# Patient Record
Sex: Male | Born: 1935
Health system: Southern US, Community
[De-identification: ages and names within clinical notes are randomized; demographics above are authoritative.]

## PROBLEM LIST (undated history)

## (undated) DIAGNOSIS — J45909 Unspecified asthma, uncomplicated: Secondary | ICD-10-CM

## (undated) DIAGNOSIS — I639 Cerebral infarction, unspecified: Secondary | ICD-10-CM

## (undated) DIAGNOSIS — I10 Essential (primary) hypertension: Secondary | ICD-10-CM

## (undated) DIAGNOSIS — F419 Anxiety disorder, unspecified: Secondary | ICD-10-CM

## (undated) DIAGNOSIS — Z8673 Personal history of transient ischemic attack (TIA), and cerebral infarction without residual deficits: Secondary | ICD-10-CM

## (undated) DIAGNOSIS — E785 Hyperlipidemia, unspecified: Secondary | ICD-10-CM

## (undated) DIAGNOSIS — J449 Chronic obstructive pulmonary disease, unspecified: Secondary | ICD-10-CM

## (undated) DIAGNOSIS — K8050 Calculus of bile duct without cholangitis or cholecystitis without obstruction: Secondary | ICD-10-CM

## (undated) DIAGNOSIS — F0390 Unspecified dementia without behavioral disturbance: Secondary | ICD-10-CM

## (undated) HISTORY — DX: Unspecified asthma, uncomplicated: J45.909

## (undated) HISTORY — DX: Unspecified dementia, unspecified severity, without behavioral disturbance, psychotic disturbance, mood disturbance, and anxiety: F03.90

## (undated) HISTORY — DX: Hyperlipidemia, unspecified: E78.5

## (undated) HISTORY — DX: Calculus of bile duct without cholangitis or cholecystitis without obstruction: K80.50

## (undated) HISTORY — DX: Anxiety disorder, unspecified: F41.9

## (undated) HISTORY — DX: Personal history of transient ischemic attack (TIA), and cerebral infarction without residual deficits: Z86.73

## (undated) HISTORY — DX: Cerebral infarction, unspecified: I63.9

## (undated) HISTORY — PX: ROTATOR CUFF REPAIR: SHX139

---

## 2004-10-05 ENCOUNTER — Ambulatory Visit: Payer: Self-pay | Admitting: Gastroenterology

## 2004-10-25 ENCOUNTER — Ambulatory Visit: Payer: Self-pay | Admitting: Gastroenterology

## 2004-10-25 ENCOUNTER — Ambulatory Visit (HOSPITAL_COMMUNITY): Admission: RE | Admit: 2004-10-25 | Discharge: 2004-10-25 | Payer: Self-pay | Admitting: Gastroenterology

## 2005-09-06 ENCOUNTER — Emergency Department (HOSPITAL_COMMUNITY): Admission: EM | Admit: 2005-09-06 | Discharge: 2005-09-06 | Payer: Self-pay | Admitting: Emergency Medicine

## 2005-09-12 ENCOUNTER — Emergency Department (HOSPITAL_COMMUNITY): Admission: EM | Admit: 2005-09-12 | Discharge: 2005-09-12 | Payer: Self-pay | Admitting: Emergency Medicine

## 2006-11-07 ENCOUNTER — Emergency Department (HOSPITAL_COMMUNITY): Admission: EM | Admit: 2006-11-07 | Discharge: 2006-11-07 | Payer: Self-pay | Admitting: Emergency Medicine

## 2009-09-29 ENCOUNTER — Ambulatory Visit (HOSPITAL_COMMUNITY): Admission: RE | Admit: 2009-09-29 | Discharge: 2009-09-29 | Payer: Self-pay | Admitting: Family Medicine

## 2010-11-30 ENCOUNTER — Inpatient Hospital Stay (HOSPITAL_COMMUNITY): Payer: Medicare Other

## 2010-11-30 ENCOUNTER — Inpatient Hospital Stay (HOSPITAL_COMMUNITY)
Admission: EM | Admit: 2010-11-30 | Discharge: 2010-12-01 | DRG: 069 | Disposition: A | Discharge status: Home / Self Care | Payer: Medicare Other | Attending: Internal Medicine | Admitting: Internal Medicine

## 2010-11-30 ENCOUNTER — Emergency Department (HOSPITAL_COMMUNITY): Payer: Medicare Other

## 2010-11-30 DIAGNOSIS — G9389 Other specified disorders of brain: Secondary | ICD-10-CM | POA: Diagnosis present

## 2010-11-30 DIAGNOSIS — G459 Transient cerebral ischemic attack, unspecified: Principal | ICD-10-CM | POA: Diagnosis present

## 2010-11-30 DIAGNOSIS — I10 Essential (primary) hypertension: Secondary | ICD-10-CM | POA: Diagnosis present

## 2010-11-30 DIAGNOSIS — J4489 Other specified chronic obstructive pulmonary disease: Secondary | ICD-10-CM | POA: Diagnosis present

## 2010-11-30 DIAGNOSIS — E119 Type 2 diabetes mellitus without complications: Secondary | ICD-10-CM | POA: Diagnosis present

## 2010-11-30 DIAGNOSIS — J449 Chronic obstructive pulmonary disease, unspecified: Secondary | ICD-10-CM | POA: Diagnosis present

## 2010-11-30 DIAGNOSIS — E785 Hyperlipidemia, unspecified: Secondary | ICD-10-CM | POA: Diagnosis present

## 2010-11-30 LAB — RAPID URINE DRUG SCREEN, HOSP PERFORMED
Amphetamines: NOT DETECTED
Barbiturates: NOT DETECTED
Benzodiazepines: NOT DETECTED
Opiates: NOT DETECTED

## 2010-11-30 LAB — BASIC METABOLIC PANEL
Calcium: 9.1 mg/dL (ref 8.4–10.5)
GFR calc non Af Amer: >60 mL/min (ref >60)
Potassium: 3.6 mEq/L (ref 3.5–5.1)

## 2010-11-30 LAB — URINALYSIS, ROUTINE W REFLEX MICROSCOPIC
Bilirubin Urine: NEGATIVE
Ketones, ur: NEGATIVE mg/dL
Nitrite: NEGATIVE
Protein, ur: NEGATIVE mg/dL
Specific Gravity, Urine: 1.023 (ref 1.005–1.030)
Urobilinogen, UA: 0.2 mg/dL (ref 0.0–1.0)
pH: 5 (ref 5.0–8.0)

## 2010-11-30 LAB — APTT: aPTT: 32 seconds (ref 24–37)

## 2010-11-30 LAB — HEPATIC FUNCTION PANEL
ALT: 19 U/L (ref 0–53)
Bilirubin, Direct: <0.1 mg/dL (ref 0.0–0.3)

## 2010-11-30 LAB — CBC
MCH: 29.7 pg (ref 26.0–34.0)
MCHC: 34.7 g/dL (ref 30.0–36.0)
RBC: 4.44 MIL/uL (ref 4.22–5.81)

## 2010-11-30 LAB — CK TOTAL AND CKMB (NOT AT ARMC): Total CK: 143 U/L (ref 7–232)

## 2010-11-30 LAB — ETHANOL: Alcohol, Ethyl (B): <11 mg/dL — ABNORMAL HIGH (ref 0–10)

## 2010-12-01 DIAGNOSIS — G459 Transient cerebral ischemic attack, unspecified: Secondary | ICD-10-CM

## 2010-12-01 DIAGNOSIS — I517 Cardiomegaly: Secondary | ICD-10-CM

## 2010-12-01 LAB — LIPID PANEL
Cholesterol: 154 mg/dL (ref 0–200)
HDL: 26 mg/dL — ABNORMAL LOW (ref >39)
LDL Cholesterol: 59 mg/dL (ref 0–99)
Total CHOL/HDL Ratio: 5.9 RATIO
VLDL: 69 mg/dL — ABNORMAL HIGH (ref 0–40)

## 2010-12-01 LAB — GLUCOSE, CAPILLARY
Glucose-Capillary: 217 mg/dL — ABNORMAL HIGH (ref 70–99)
Glucose-Capillary: 166 mg/dL — ABNORMAL HIGH (ref 70–99)

## 2010-12-01 LAB — HEMOGLOBIN A1C: Mean Plasma Glucose: 180 mg/dL — ABNORMAL HIGH (ref <117)

## 2010-12-07 NOTE — H&P (Signed)
NAMEREMBERT, BROWE                 ACCOUNT NO.:  [id number]  MEDICAL RECORD NO.:  [id number]           PATIENT TYPE:  E  LOCATION:  WLED                         FACILITY:  [facility]  PHYSICIAN:  Hartley Barefoot, MD    DATE OF BIRTH:  31-May-1936  DATE OF ADMISSION:  11/30/2010 DATE OF DISCHARGE:                             HISTORY & PHYSICAL   CHIEF COMPLAINT:  Unable to focus and slurred speech.  HISTORY OF PRESENT ILLNESS:  This is a 75 year old very pleasant African American who presents to the emergency department after he had an episode of slurred speech.  He was referred by his primary care physician for workup of TIA.  The patient relates that around 10 a.m., while he was at work, he could not focus; he was not able to write or read.  He said that he was able to see but he was having problem understanding what he was seeing and what he was doing.  The episode lasted for 15 minutes to half an hour.  He called his wife and she thought that his speech was slurred.  He denies chest pain, shortness of breath or weakness.  This is first time that he had an episode like this.  ALLERGIES:  No known drug allergies.  PAST MEDICAL HISTORY: 1. Diabetes. 2. Hypertension. 3. COPD. 4. Hyperlipidemia.  PAST SURGICAL HISTORY:  History of rotator cuff tear, status post repair.  MEDICATION HISTORY: 1. Benicar 40 mg half a tablet b.i.d. 2. Glyburide/metformin 2.5/500 mg half a tablet b.i.d.  SOCIAL HISTORY:  Denies smoking, alcohol or recreational drugs.  He is married.  He has five daughters and one son.  He works as a Office manager. He lives with his wife.  FAMILY HISTORY:  Father died at 42, had history of kidney disease. Mother died at 78 year old, unknown cause.  REVIEW OF SYSTEMS:  Denies nausea, vomiting, weakness, abdominal pain, chest pain, shortness of breath, blurry vision, joint pain.  PHYSICAL EXAMINATION:  VITAL SIGNS:  Blood pressure initially 144/83 now 127/72,  temperature 98.8, pulse 86, respirations 25, saturation 97 on room air. GENERAL:  The patient is lying in bed in no acute distress. HEENT:  Head atraumatic, normocephalic.  Eyes anicteric.  Pupils equal, reactive to light.  Extraocular muscles intact. CARDIOVASCULAR:  S1, S2 regular rhythm and rate.  No rubs, murmurs or gallops. LUNGS:  Bilateral good air movement.  No wheezing, crackles or rhonchi. ABDOMEN:  Obese.  Bowel sounds present.  Abdomen is soft, nontender, nondistended.  No rigidity, no guarding. EXTREMITIES:  Pulses present.  No edema. NEUROLOGIC:  Alert and oriented x3.  Cranial nerves II-XII intact. Sensation grossly intact.  Motor strength 5/5 throughout.  Reflexes normal.  ADMISSION LABS:  UDS negative.  Sodium 140, potassium 3.6, chloride 104, bicarb 23, glucose 139, BUN 14, creatinine 1.09, calcium 9.1.  Alcohol less than 11.  Troponin less than 0.30, CK-MB 1.6.  PTT 32, PT 13, INR 1.0.  UA negative.  CBC, white blood cell 6.6, hemoglobin 13.2, hematocrit 38.0, platelet 183.  RADIOGRAPHIC STUDIES: 1. CT head:  No acute intracranial finding, mild microvascular  disease. 2. Chest x-ray:  Seen on examination no acute cardiopulmonary process.  ASSESSMENT AND PLAN:  This is a very pleasant 75 year old that presents with neurological symptoms that resolved within 30 minutes. 1. Transient ischemic attack:  The patient presents with neurological     symptoms that resolved within 30 minutes.  He has multiple risk     factors for a stroke; diabetes, hypertension, possible     hyperlipidemia.  We will do a workup for TIA; MRI, carotid     Dopplers, 2-D echo, fasting lipid panel.  We will do a swallow     evaluation.  PT, OT consult.  Aspirin; the patient was not taking     aspirin, will start aspirin p.o. daily.  We will try to keep blood     pressure below 160. 2. Hypertension:  We are going to start on olmesartan in the morning     if his blood pressure is more than  120.  Will write for hydralazine     p.r.n. for systolic blood pressure more than 150. 3. Diabetes:  Will check hemoglobin A1c.  We will start sliding scale     insulin, CBG t.i.d. and at bedtime. 4. Chronic obstructive pulmonary disease:  Appears to be stable, we     will start albuterol p.r.n. 5. For deep venous thrombosis prophylaxis, Lovenox.     Hartley Barefoot, MD     BR/MEDQ  D:  11/30/2010  T:  11/30/2010  Job:  [id number]  Electronically Signed by Hartley Barefoot MD on 12/07/2010 09:58:57 PM

## 2010-12-07 NOTE — Discharge Summary (Signed)
[name], [name]                 ACCOUNT NO.:  [id number]  MEDICAL RECORD NO.:  [id number]           PATIENT TYPE:  I  LOCATION:  1411                         FACILITY:  [facility]  PHYSICIAN:  Hartley Barefoot, MD    DATE OF BIRTH:  03-01-1936  DATE OF ADMISSION:  11/30/2010 DATE OF DISCHARGE:  12/01/2010                              DISCHARGE SUMMARY   DISCHARGE DIAGNOSES: 1. Transient ischemic attack. 2. Cystic encephalomalacia. 3. Hypertension. 4. Diabetes.  DISCHARGE MEDICATIONS: 1. Norvasc 2.5 mg p.o. daily. 2. Aspirin 81 mg p.o. daily. 3. Glyburide and metformin 2.5/500 mg 1 tablet by mouth twice daily. 4. Benicar 40 mg half tablet p.o. b.i.d.  DISPOSITION AND FOLLOWUP:  [name] will need to follow with his primary care physician within 2 weeks to follow blood sugar level.  He might need at that time increased dose of his medications or consider insulin. The patient might benefit of fish oil also.  This can be addressed by his primary care physician.[facility] will call him also to arrange an appointment for followup of cystic encephalomalacia.  BRIEF HISTORY OF PRESENT ILLNESS:  This is a very pleasant 75 year old African American with past medical history of hypertension, diabetes, COPD who presents to the emergency department after slurred speech and confusion that lasted for 30 minutes.  The patient was admitted for a TIA workup. 1. TIA symptoms, resolved.  The patient was admitted to telemetry.  No     arrhythmia on telemetry.  Preliminary report of vascular carotid     Doppler showed no evidence of ICA stenosis.  Vertebral artery flow     is anterograde bilaterally.  A 2-D echo showed ejection fraction of     60-65%, no wall motion abnormality.  Doppler parameters consistent     with diastolic heart failure.  MRI showed no acute stroke but     showed prior stroke in the frontal lobe and parietal lobe and     vascular ischemic changes.  The patient was  continued on aspirin.     He was not taking aspirin.  His diabetes medication is going to be     increased to try to control better his blood sugar.  Hemoglobin A1c     was 7.9.  His fasting lipid panel, his LDL was good at 59.  His HDL     was low.  He might benefit from fish oil or change in diet. 2. Probably cystic encephalomalacia per MRI.  I will refer to      neurologist.  He might need a followup MRI as per recommendations     of radiologist. 3. Diabetes.  His hemoglobin A1c is 7.9.  We will increase his     medication.  He will need to follow up with his primary care     physician within 1 or 2 weeks, follow blood sugar level and     consider increased medications or add insulin. 4. Hypertension.  The patient was continued on his home Benicar.  We     are going to add Norvasc 2.5 low dose.  He  will need to follow with     his primary care physician.  STUDIES PERFORMED:  MRI showed no acute infarct.  Remote infarct and prominent small vessel cystic changes.  Small cystic appearance anterior- superior left frontal lobe most likely related to cystic encephalomalacia from prior infarct.  MRA of head showed intracranial atherosclerotic-type changes.  2-D echo, normal ejection fraction of 60- 65% and no wall motion abnormality.  Diastolic heart dysfunction type 1.     Hartley Barefoot, MD     BR/MEDQ  D:  12/01/2010  T:  12/02/2010  Job:  [id number]  cc:   [facility]  [facility]  Electronically Signed by Hartley Barefoot MD on 12/07/2010 10:01:38 PM

## 2011-07-10 DIAGNOSIS — I639 Cerebral infarction, unspecified: Secondary | ICD-10-CM

## 2011-07-10 HISTORY — DX: Cerebral infarction, unspecified: I63.9

## 2011-12-30 ENCOUNTER — Encounter (HOSPITAL_COMMUNITY): Payer: Self-pay | Admitting: *Deleted

## 2011-12-30 ENCOUNTER — Emergency Department (HOSPITAL_COMMUNITY): Payer: Medicare Other

## 2011-12-30 ENCOUNTER — Emergency Department (HOSPITAL_COMMUNITY)
Admission: EM | Admit: 2011-12-30 | Discharge: 2011-12-30 | Disposition: A | Discharge status: Home / Self Care | Payer: Medicare Other | Attending: Emergency Medicine | Admitting: Emergency Medicine

## 2011-12-30 DIAGNOSIS — E119 Type 2 diabetes mellitus without complications: Secondary | ICD-10-CM | POA: Insufficient documentation

## 2011-12-30 DIAGNOSIS — I10 Essential (primary) hypertension: Secondary | ICD-10-CM | POA: Insufficient documentation

## 2011-12-30 DIAGNOSIS — J40 Bronchitis, not specified as acute or chronic: Secondary | ICD-10-CM

## 2011-12-30 DIAGNOSIS — J4489 Other specified chronic obstructive pulmonary disease: Secondary | ICD-10-CM | POA: Insufficient documentation

## 2011-12-30 DIAGNOSIS — J449 Chronic obstructive pulmonary disease, unspecified: Secondary | ICD-10-CM

## 2011-12-30 HISTORY — DX: Chronic obstructive pulmonary disease, unspecified: J44.9

## 2011-12-30 HISTORY — DX: Essential (primary) hypertension: I10

## 2011-12-30 MED ORDER — AZITHROMYCIN 250 MG PO TABS: 250.0000 mg | ORAL_TABLET | Freq: Every day | ORAL | Status: AC

## 2011-12-30 MED ORDER — PREDNISONE 20 MG PO TABS
60.0000 mg | ORAL_TABLET | Freq: Once | ORAL | Status: AC
  Administered 2011-12-30: 60 mg via ORAL
  Filled 2011-12-30: qty 3

## 2011-12-30 MED ORDER — PREDNISONE 10 MG PO TABS: 40.0000 mg | ORAL_TABLET | Freq: Every day | ORAL | Status: DC

## 2011-12-30 MED ORDER — DM-GUAIFENESIN ER 30-600 MG PO TB12: 1.0000 | ORAL_TABLET | Freq: Two times a day (BID) | ORAL | Status: AC

## 2011-12-30 NOTE — ED Provider Notes (Addendum)
History     CSN: [id number]  Arrival date & time 12/30/11  0736   First MD Initiated Contact with Patient 12/30/11 0740      Chief Complaint  Patient presents with  . Shortness of Breath    (Consider location/radiation/quality/duration/timing/severity/associated sxs/prior treatment) Patient is a 76 y.o. male presenting with shortness of breath. The history is provided by the patient and a relative.  Shortness of Breath  The current episode started 3 to 5 days ago. The problem occurs frequently. The problem has been gradually improving. The problem is moderate. The symptoms are relieved by rest. The symptoms are aggravated by activity. Associated symptoms include cough and shortness of breath. Pertinent negatives include no chest pain, no fever, no sore throat and no wheezing. The cough is productive. Nothing worsens the cough.   Patient with a history of COPD has had some nasal congestion worsening of cough and productive cough is yellow and shortness of breath mostly with exertion since Thursday 4 days ago. Patient already using his albuterol inhaler approximately every 6 hours. Past Medical History  Diagnosis Date  . COPD (chronic obstructive pulmonary disease)   . Diabetes mellitus   . Hypertension     Past Surgical History  Procedure Date  . Rotator cuff repair     No family history on file.  History  Substance Use Topics  . Smoking status: Never Smoker   . Smokeless tobacco: Not on file  . Alcohol Use: No      Review of Systems  Constitutional: Negative for fever.  HENT: Positive for congestion. Negative for sore throat.   Respiratory: Positive for cough and shortness of breath. Negative for wheezing.   Cardiovascular: Negative for chest pain.  Gastrointestinal: Negative for nausea, vomiting and abdominal pain.  Genitourinary: Negative for dysuria.  Musculoskeletal: Negative for back pain.  Skin: Negative for rash.  Neurological: Negative for headaches.    Hematological: Does not bruise/bleed easily.    Allergies  Review of patient's allergies indicates no known allergies.  Home Medications   Current Outpatient Rx  Name Route Sig Dispense Refill  . AZITHROMYCIN 250 MG PO TABS Oral Take 1 tablet (250 mg total) by mouth daily. Take first 2 tablets together, then 1 every day until finished. 6 tablet 0  . DM-GUAIFENESIN ER 30-600 MG PO TB12 Oral Take 1 tablet by mouth every 12 (twelve) hours. 14 tablet 1  . PREDNISONE 10 MG PO TABS Oral Take 4 tablets (40 mg total) by mouth daily. 20 tablet 0    BP 156/93  Pulse 80  Temp 98.2 F (36.8 C) (Oral)  Resp 16  Ht 6' (1.829 m)  Wt 212 lb (96.163 kg)  BMI 28.75 kg/m2  SpO2 99%  Physical Exam  Nursing note and vitals reviewed. Constitutional: He is oriented to person, place, and time. He appears well-developed and well-nourished. No distress.  HENT:  Head: Normocephalic and atraumatic.  Mouth/Throat: Oropharynx is clear and moist.  Eyes: Conjunctivae and EOM are normal. Pupils are equal, round, and reactive to light.  Neck: Normal range of motion. Neck supple.  Cardiovascular: Normal rate, regular rhythm and normal heart sounds.   No murmur heard. Pulmonary/Chest: Effort normal and breath sounds normal. No respiratory distress. He has no wheezes. He has no rales.  Abdominal: Soft. Bowel sounds are normal. There is no tenderness.  Musculoskeletal: Normal range of motion.  Neurological: He is alert and oriented to person, place, and time.  Skin: No rash noted.  ED Course  Procedures (including critical care time)  Labs Reviewed - No data to display Dg Chest 2 View  12/30/2011  *RADIOLOGY REPORT*  Clinical Data: Short of breath  CHEST - 2 VIEW  Comparison: 11/30/2010  Findings: Upper normal heart size.  No consolidation, mass, pleural effusion, pneumothorax.  Stable bronchitic changes.  IMPRESSION: No active cardiopulmonary disease.  Original Report Authenticated By: Donavan Burnet,  M.D.     1. COPD (chronic obstructive pulmonary disease)   2. Bronchitis       MDM    Patient's oxygen saturation or room air is 97% no acute distress nontoxic history of COPD probably exacerbated by an upper rest for infection with bronchitis. Will start a course of prednisone and Mucinex DM and a trial of Zithromax in case of secondary bacterial infection. Patient's primary care Dr. To followup with.      Shelda Jakes, MD 12/30/11 0900  Shelda Jakes, MD 12/30/11 [phone]

## 2011-12-30 NOTE — ED Notes (Signed)
Pt states productive cough, yellow in color. Cough is worse at night. Hx of COPD per pt. Shortness of breath at times, worse with exertion. "Soreness to chest from coughing so much, but it is about gone now." per pt.

## 2011-12-30 NOTE — Discharge Instructions (Signed)
Bronchitis Bronchitis is a problem of the air tubes leading to your lungs. This problem makes it hard for air to get in and out of the lungs. You may cough a lot because your air tubes are narrow. Going without care can cause lasting (chronic) bronchitis. HOME CARE   Drink enough fluids to keep your pee (urine) clear or pale yellow.   Use a cool mist humidifier.   Quit smoking if you smoke. If you keep smoking, the bronchitis might not get better.   Only take medicine as told by your doctor.  GET HELP RIGHT AWAY IF:   Coughing keeps you awake.   You start to wheeze.   You become more sick or weak.   You have a hard time breathing or get short of breath.   You cough up blood.   Coughing lasts more than 2 weeks.   You have a fever.   Your baby is older than 3 months with a rectal temperature of 102 F (38.9 C) or higher.   Your baby is 24 months old or younger with a rectal temperature of 100.4 F (38 C) or higher.  MAKE SURE YOU:  Understand these instructions.   Will watch your condition.   Will get help right away if you are not doing well or get worse.  Document Released: 12/12/2007 Document Revised: 06/14/2011 Document Reviewed: 05/27/2009 [facility] Patient Information 2012 [place], [place].  Use albuterol inhaler every 6 hours for the next week at least. Take antibiotic as directed take prednisone as directed. Return for new worse symptoms. Either use Mucinex DM prescription or get it over-the-counter. Followup with regular doctor if not improving in 2 days.

## 2012-07-09 ENCOUNTER — Emergency Department (HOSPITAL_COMMUNITY): Payer: Medicare Other

## 2012-07-09 ENCOUNTER — Emergency Department (HOSPITAL_COMMUNITY)
Admission: EM | Admit: 2012-07-09 | Discharge: 2012-07-09 | Discharge status: Against Medical Advice | Payer: Medicare Other | Attending: Emergency Medicine | Admitting: Emergency Medicine

## 2012-07-09 ENCOUNTER — Encounter (HOSPITAL_COMMUNITY): Payer: Self-pay

## 2012-07-09 DIAGNOSIS — I10 Essential (primary) hypertension: Secondary | ICD-10-CM | POA: Insufficient documentation

## 2012-07-09 DIAGNOSIS — J4489 Other specified chronic obstructive pulmonary disease: Secondary | ICD-10-CM | POA: Insufficient documentation

## 2012-07-09 DIAGNOSIS — J449 Chronic obstructive pulmonary disease, unspecified: Secondary | ICD-10-CM | POA: Insufficient documentation

## 2012-07-09 DIAGNOSIS — R05 Cough: Secondary | ICD-10-CM

## 2012-07-09 DIAGNOSIS — Z79899 Other long term (current) drug therapy: Secondary | ICD-10-CM | POA: Insufficient documentation

## 2012-07-09 DIAGNOSIS — R0602 Shortness of breath: Secondary | ICD-10-CM | POA: Insufficient documentation

## 2012-07-09 DIAGNOSIS — J3489 Other specified disorders of nose and nasal sinuses: Secondary | ICD-10-CM | POA: Insufficient documentation

## 2012-07-09 DIAGNOSIS — R61 Generalized hyperhidrosis: Secondary | ICD-10-CM | POA: Insufficient documentation

## 2012-07-09 DIAGNOSIS — R059 Cough, unspecified: Secondary | ICD-10-CM | POA: Insufficient documentation

## 2012-07-09 DIAGNOSIS — R079 Chest pain, unspecified: Secondary | ICD-10-CM | POA: Insufficient documentation

## 2012-07-09 DIAGNOSIS — E119 Type 2 diabetes mellitus without complications: Secondary | ICD-10-CM | POA: Insufficient documentation

## 2012-07-09 MED ORDER — ALBUTEROL SULFATE (5 MG/ML) 0.5% IN NEBU
2.5000 mg | INHALATION_SOLUTION | Freq: Once | RESPIRATORY_TRACT | Status: AC
  Administered 2012-07-09: 2.5 mg via RESPIRATORY_TRACT
  Filled 2012-07-09: qty 0.5

## 2012-07-09 MED ORDER — IPRATROPIUM BROMIDE 0.02 % IN SOLN
0.5000 mg | Freq: Once | RESPIRATORY_TRACT | Status: AC
  Administered 2012-07-09: 0.5 mg via RESPIRATORY_TRACT
  Filled 2012-07-09: qty 2.5

## 2012-07-09 NOTE — ED Notes (Signed)
Patient asking about discharge. Will inform Dr Deretha Emory.

## 2012-07-09 NOTE — ED Notes (Signed)
Pt reports saw PCP Monday for regular check up.  PCP stopped pt's glyburide and increased his metformin.  Pt c/o coughing spells, diaphoresis, pain in chest with coughing.

## 2012-07-09 NOTE — ED Notes (Signed)
Respiratory paged for breathing treatment 

## 2012-07-09 NOTE — ED Notes (Signed)
Secretary informed RN that patient left ED without waiting for discharge. Dr Deretha Emory informed.

## 2012-07-09 NOTE — ED Provider Notes (Signed)
History   This chart was scribed for Shelda Jakes, MD, by Frederik Pear, ER scribe. The patient was seen in room APA03/APA03 and the patient's care was started at 1202.   CSN: [id number]  Arrival date & time 07/09/12  1002   First MD Initiated Contact with Patient 07/09/12 1202      Chief Complaint  Patient presents with  . Cough    (Consider location/radiation/quality/duration/timing/severity/associated sxs/prior treatment) Patient is a 77 y.o. male presenting with cough. The history is provided by the patient. No language interpreter was used.  Cough This is a new problem. The current episode started yesterday. The problem occurs every few minutes. The problem has been gradually improving. There has been no fever. Associated symptoms include chest pain and shortness of breath. Pertinent negatives include no headaches and no eye redness. Treatments tried: Breathing treatment. He is not a smoker. His past medical history is significant for COPD.    [name] is a 77 y.o. male with a h/o of COPD, hypertension, and DM who presents to the Emergency Department complaining of constant, moderate SOB with associated diaphoresis that began yesterday. His wife reports an associated gradually improving deep cough that began yesterday. He states that he used his nebulizer at home yesterday, but not today.He states that he saw his PCP 2 days ago for a regular checkup and has elevated glucose levels.   PCP is Dr. Constance Haw in [facility].   Past Medical History  Diagnosis Date  . COPD (chronic obstructive pulmonary disease)   . Diabetes mellitus   . Hypertension     Past Surgical History  Procedure Date  . Rotator cuff repair     No family history on file.  History  Substance Use Topics  . Smoking status: Never Smoker   . Smokeless tobacco: Not on file  . Alcohol Use: No      Review of Systems  Constitutional: Positive for diaphoresis. Negative for fever.  HENT: Positive  for congestion.   Eyes: Negative for redness.  Respiratory: Positive for cough and shortness of breath.   Cardiovascular: Positive for chest pain.  Gastrointestinal: Negative for nausea, vomiting and abdominal pain.  Genitourinary: Negative for dysuria and hematuria.  Musculoskeletal: Negative for back pain.  Skin: Negative for rash.  Neurological: Negative for headaches.  All other systems reviewed and are negative.    Allergies  Review of patient's allergies indicates no known allergies.  Home Medications   Current Outpatient Rx  Name  Route  Sig  Dispense  Refill  . ALBUTEROL SULFATE (2.5 MG/3ML) 0.083% IN NEBU   Nebulization   Take 2.5 mg by nebulization every 6 (six) hours as needed. Shortness of breath.         . ASPIRIN EC 81 MG PO TBEC   Oral   Take 81 mg by mouth daily.         [name] Kitchen FLUTICASONE-SALMETEROL 250-50 MCG/DOSE IN AEPB   Inhalation   Inhale 1 puff into the lungs every 12 (twelve) hours.         [name] Kitchen METFORMIN HCL 1000 MG PO TABS   Oral   Take 1,000 mg by mouth 2 (two) times daily with a meal.         . OLMESARTAN MEDOXOMIL 40 MG PO TABS   Oral   Take 40 mg by mouth daily.           BP 138/76  Pulse 89  Temp 99.1 F (37.3 C) (Oral)  Resp 19  Ht 6' (1.829 m)  Wt 210 lb (95.255 kg)  BMI 28.48 kg/m2  SpO2 98%  Physical Exam  Nursing note and vitals reviewed. Constitutional: He is oriented to person, place, and time. He appears well-developed and well-nourished.  HENT:  Head: Normocephalic and atraumatic.  Cardiovascular: Normal rate and regular rhythm.   No murmur heard. Pulmonary/Chest: Effort normal. No respiratory distress. He has wheezes. He has no rales. He exhibits no tenderness.       He has wheezing bilaterally, but predominantly on the right side.  Abdominal: Soft. Bowel sounds are normal. He exhibits no distension and no mass. There is no tenderness. There is no rebound and no guarding.  Musculoskeletal: Normal range of  motion. He exhibits no edema.  Neurological: He is alert and oriented to person, place, and time. No cranial nerve deficit. He exhibits normal muscle tone. Coordination normal.  Psychiatric: He has a normal mood and affect. Thought content normal.    ED Course  Procedures (including critical care time)  DIAGNOSTIC STUDIES: Oxygen Saturation is 99% on room air, normal by my interpretation.    COORDINATION OF CARE:  12:54- Discussed planned course of treatment with the patient, including blood work, who is agreeable at this time.   Labs Reviewed  GLUCOSE, CAPILLARY - Abnormal; Notable for the following:    Glucose-Capillary 192 (*)     All other components within normal limits  LAB REPORT - SCANNED   No results found. Results for orders placed during the hospital encounter of 07/09/12  GLUCOSE, CAPILLARY      Component Value Range   Glucose-Capillary 192 (*) 70 - 99 mg/dL     1. Cough   2. Chest pain       MDM  Patient ended up leaving AMA before discharge. Chest Xray negative for pneumonia and blood sugar not significantly elevated.   I personally performed the services described in this documentation, which was scribed in my presence. The recorded information has been reviewed and is accurate.         Shelda Jakes, MD 07/12/12 [phone]

## 2012-07-09 NOTE — ED Notes (Signed)
Alert. Ambulatory to room with steady gait and without assistance. No distress noted. Awaiting x ray and MD evaluation.

## 2012-10-01 ENCOUNTER — Telehealth: Payer: Self-pay | Admitting: Family Medicine

## 2012-10-01 MED ORDER — AMLODIPINE BESYLATE 2.5 MG PO TABS: 2.5000 mg | ORAL_TABLET | Freq: Every day | ORAL | Status: DC

## 2012-10-01 NOTE — Telephone Encounter (Signed)
rx refilled.

## 2012-10-11 ENCOUNTER — Encounter: Payer: Self-pay | Admitting: Family Medicine

## 2012-10-11 DIAGNOSIS — Z8673 Personal history of transient ischemic attack (TIA), and cerebral infarction without residual deficits: Secondary | ICD-10-CM | POA: Insufficient documentation

## 2012-10-13 ENCOUNTER — Ambulatory Visit (INDEPENDENT_AMBULATORY_CARE_PROVIDER_SITE_OTHER): Payer: Medicare Other | Admitting: Family Medicine

## 2012-10-13 ENCOUNTER — Encounter: Payer: Self-pay | Admitting: Family Medicine

## 2012-10-13 VITALS — BP 100/62 | HR 88 | Temp 98.2°F | Resp 16 | Wt 220.0 lb

## 2012-10-13 DIAGNOSIS — E119 Type 2 diabetes mellitus without complications: Secondary | ICD-10-CM | POA: Insufficient documentation

## 2012-10-13 DIAGNOSIS — I10 Essential (primary) hypertension: Secondary | ICD-10-CM | POA: Insufficient documentation

## 2012-10-13 DIAGNOSIS — E1159 Type 2 diabetes mellitus with other circulatory complications: Secondary | ICD-10-CM | POA: Insufficient documentation

## 2012-10-13 DIAGNOSIS — E785 Hyperlipidemia, unspecified: Secondary | ICD-10-CM

## 2012-10-13 LAB — LIPID PANEL
Cholesterol: 179 mg/dL (ref 0–200)
LDL Cholesterol: 84 mg/dL (ref 0–99)
Triglycerides: 303 mg/dL — ABNORMAL HIGH (ref <150)

## 2012-10-13 LAB — HEPATIC FUNCTION PANEL
ALT: 19 U/L (ref 0–53)
Total Protein: 7.5 g/dL (ref 6.0–8.3)

## 2012-10-13 LAB — BASIC METABOLIC PANEL
BUN: 16 mg/dL (ref 6–23)
Calcium: 9.7 mg/dL (ref 8.4–10.5)
Creat: 1.45 mg/dL — ABNORMAL HIGH (ref 0.50–1.35)
Glucose, Bld: 227 mg/dL — ABNORMAL HIGH (ref 70–99)

## 2012-10-13 LAB — HEMOGLOBIN A1C: Hgb A1c MFr Bld: 8.1 % — ABNORMAL HIGH (ref <5.7)

## 2012-10-13 NOTE — Progress Notes (Signed)
Subjective:     Patient ID: [name], male   DOB: [birth year], 77 y.o.   MRN: [id number]  HPI Patient is here today for followup of his diabetes, hypertension, hyperlipidemia and history of TIA.  He is taking an aspirin 81 mg every day. His medication list is reviewed. He reports 100% compliance with medications. He is also trying to adhere to a low carbohydrate diet. He's not been any regular exercise. He reports random blood sugars of 120-140. Past Medical History  Diagnosis Date  . COPD (chronic obstructive pulmonary disease)   . Hx-TIA (transient ischemic attack)   . Diabetes mellitus   . Hypertension   . Hyperlipidemia    Current Outpatient Prescriptions on File Prior to Visit  Medication Sig Dispense Refill  . albuterol (PROVENTIL) (2.5 MG/3ML) 0.083% nebulizer solution Take 2.5 mg by nebulization every 6 (six) hours as needed. Shortness of breath.      [name] Kitchen amLODipine (NORVASC) 2.5 MG tablet Take 1 tablet (2.5 mg total) by mouth daily.  30 tablet  3  . aspirin EC 81 MG tablet Take 81 mg by mouth daily.      . Fluticasone-Salmeterol (ADVAIR) 250-50 MCG/DOSE AEPB Inhale 1 puff into the lungs every 12 (twelve) hours.      [name] Kitchen glipiZIDE (GLUCOTROL XL) 10 MG 24 hr tablet Take 10 mg by mouth daily.      . metFORMIN (GLUCOPHAGE) 1000 MG tablet Take 1,000 mg by mouth 2 (two) times daily with a meal.      . montelukast (SINGULAIR) 10 MG tablet Take 10 mg by mouth at bedtime.      [name] Kitchen olmesartan (BENICAR) 40 MG tablet Take 40 mg by mouth daily.      [name] Kitchen omeprazole (PRILOSEC) 40 MG capsule Take 40 mg by mouth daily.       No current facility-administered medications on file prior to visit.   History   Social History  . Marital Status: Married    Spouse Name: N/A    Number of Children: N/A  . Years of Education: N/A   Occupational History  . Not on file.   Social History Main Topics  . Smoking status: Never Smoker   . Smokeless tobacco: Not on file  . Alcohol Use: No  . Drug Use: No   . Sexually Active:    Other Topics Concern  . Not on file   Social History Narrative  . No narrative on file     Review of Systems  Constitutional: Negative.   HENT: Negative.   Respiratory: Negative.   Cardiovascular: Negative.   Gastrointestinal: Negative.   Genitourinary: Negative.   Neurological: Negative.        Objective:   Physical Exam  Constitutional: He appears well-developed and well-nourished.  HENT:  Head: Normocephalic.  Eyes: Conjunctivae and EOM are normal. Pupils are equal, round, and reactive to light.  Neck: Normal range of motion. Neck supple. No JVD present. No thyromegaly present.  Cardiovascular: Normal rate, normal heart sounds and intact distal pulses.   No murmur heard. Pulmonary/Chest: Effort normal and breath sounds normal. No respiratory distress. He has no wheezes. He has no rales.  Abdominal: Soft. Bowel sounds are normal. He exhibits no distension. There is no tenderness. There is no rebound.  Lymphadenopathy:    He has no cervical adenopathy.       Assessment:     Type II or unspecified type diabetes mellitus without mention of complication, uncontrolled - Plan: Basic Metabolic Panel, Hemoglobin  A1c, Hepatic Function Panel, Lipid Panel  Essential hypertension, benign  Other and unspecified hyperlipidemia      Plan:     1. Type II or unspecified type diabetes mellitus without mention of complication, uncontrolled A1c is less than 6.5 - Basic Metabolic Panel - Hemoglobin A1c - Hepatic Function Panel - Lipid Panel  2. Essential hypertension, benign Well controlled no change in therapy.  3. Other and unspecified hyperlipidemia Go LDL is less than 100.

## 2012-10-14 MED ORDER — SITAGLIPTIN PHOSPHATE 100 MG PO TABS: 100.0000 mg | ORAL_TABLET | Freq: Every day | ORAL | Status: DC

## 2012-10-14 NOTE — Addendum Note (Signed)
Addended by: Lynnea Ferrier T on: 10/14/2012 07:19 AM   Modules accepted: Orders

## 2012-10-30 ENCOUNTER — Other Ambulatory Visit: Payer: Self-pay | Admitting: Family Medicine

## 2012-11-10 ENCOUNTER — Telehealth: Payer: Self-pay | Admitting: Family Medicine

## 2012-11-10 MED ORDER — FLUTICASONE-SALMETEROL 250-50 MCG/DOSE IN AEPB: 1.0000 | INHALATION_SPRAY | Freq: Two times a day (BID) | RESPIRATORY_TRACT | Status: DC

## 2012-11-10 NOTE — Telephone Encounter (Signed)
Rx Refilled  

## 2012-11-13 ENCOUNTER — Telehealth: Payer: Self-pay | Admitting: Family Medicine

## 2012-11-13 NOTE — Telephone Encounter (Signed)
Rx faxed with refill.

## 2012-11-27 NOTE — Telephone Encounter (Signed)
Done 11/13/12

## 2012-12-11 ENCOUNTER — Encounter: Payer: Self-pay | Admitting: Family Medicine

## 2012-12-11 ENCOUNTER — Ambulatory Visit (INDEPENDENT_AMBULATORY_CARE_PROVIDER_SITE_OTHER): Payer: Medicare Other | Admitting: Family Medicine

## 2012-12-11 VITALS — BP 142/60 | HR 78 | Temp 98.0°F | Resp 18 | Ht 71.0 in | Wt 220.0 lb

## 2012-12-11 DIAGNOSIS — F411 Generalized anxiety disorder: Secondary | ICD-10-CM

## 2012-12-11 LAB — HEMOGLOBIN A1C: Mean Plasma Glucose: 143 mg/dL — ABNORMAL HIGH (ref <117)

## 2012-12-11 LAB — COMPLETE METABOLIC PANEL WITH GFR
ALT: 24 U/L (ref 0–53)
CO2: 23 mEq/L (ref 19–32)
Calcium: 9.3 mg/dL (ref 8.4–10.5)
Chloride: 105 mEq/L (ref 96–112)
Creat: 1.34 mg/dL (ref 0.50–1.35)
GFR, Est African American: 60 mL/min

## 2012-12-11 MED ORDER — ESCITALOPRAM OXALATE 10 MG PO TABS: 10.0000 mg | ORAL_TABLET | Freq: Every day | ORAL | Status: DC

## 2012-12-11 NOTE — Progress Notes (Signed)
Subjective:    Patient ID: [name], male    DOB: 1936-02-05, 77 y.o.   MRN: [id number]  HPI Patient is here today to recheck his sugars. However he also is complaining of increasing anxiety. He states he avoids riding in cars due to the fear. He rises this fear is unfounded however he cannot control it. He has panic attacks when he is riding in cars. This is leading him to become more isolated and remain home the majority of time. He also reports a generalized sense of anxiety which never leaves him. He denies any depression or anhedonia. He states that his sugars are always under 100. He is currently taking glipizide, metformin and Januvia for his diabetes. Past Medical History  Diagnosis Date  . COPD (chronic obstructive pulmonary disease)   . Hx-TIA (transient ischemic attack)   . Diabetes mellitus   . Hypertension   . Hyperlipidemia    Current Outpatient Prescriptions on File Prior to Visit  Medication Sig Dispense Refill  . albuterol (PROVENTIL) (2.5 MG/3ML) 0.083% nebulizer solution Take 2.5 mg by nebulization every 6 (six) hours as needed. Shortness of breath.      [name] Kitchen amLODipine (NORVASC) 2.5 MG tablet Take 1 tablet (2.5 mg total) by mouth daily.  30 tablet  3  . aspirin EC 81 MG tablet Take 81 mg by mouth daily.      . Blood Glucose Monitoring Suppl MISC by Does not apply route. Clever Choice Voice Strips and Lancets      . Fluticasone-Salmeterol (ADVAIR) 250-50 MCG/DOSE AEPB Inhale 1 puff into the lungs every 12 (twelve) hours.  60 each  11  . glipiZIDE (GLUCOTROL XL) 10 MG 24 hr tablet Take 10 mg by mouth daily.      . metFORMIN (GLUCOPHAGE) 1000 MG tablet TAKE 1 TABLET TWICE DAILY  60 tablet  3  . montelukast (SINGULAIR) 10 MG tablet Take 10 mg by mouth at bedtime.      [name] Kitchen olmesartan (BENICAR) 40 MG tablet Take 40 mg by mouth daily.      [name] Kitchen omeprazole (PRILOSEC) 40 MG capsule Take 40 mg by mouth daily.      . sitaGLIPtin (JANUVIA) 100 MG tablet Take 1 tablet (100 mg total) by  mouth daily.  30 tablet  5   No current facility-administered medications on file prior to visit.   No Known Allergies History   Social History  . Marital Status: Married    Spouse Name: N/A    Number of Children: N/A  . Years of Education: N/A   Occupational History  . Not on file.   Social History Main Topics  . Smoking status: Never Smoker   . Smokeless tobacco: Not on file  . Alcohol Use: No  . Drug Use: No  . Sexually Active:    Other Topics Concern  . Not on file   Social History Narrative  . No narrative on file      Review of Systems  All other systems reviewed and are negative.       Objective:   Physical Exam  Vitals reviewed. Constitutional: He appears well-developed and well-nourished.  Cardiovascular: Normal rate, regular rhythm and normal heart sounds.   No murmur heard. Pulmonary/Chest: Effort normal and breath sounds normal. No respiratory distress. He has no wheezes. He has no rales.  Abdominal: Soft. Bowel sounds are normal. He exhibits no distension. There is no tenderness. There is no rebound.  Assessment & Plan:  1. Type II or unspecified type diabetes mellitus without mention of complication, uncontrolled Goal hemoglobin A1c less than 6.5. I will recheck this today.Blood  Pressure is borderline. I recommended patient decrease his sodium consumption.  His reported blood sugars are well within normal limits. - COMPLETE METABOLIC PANEL WITH GFR - Hemoglobin A1c  2. GAD (generalized anxiety disorder) Begin Lexapro 10 mg by mouth daily and recheck in 1 month.

## 2013-02-10 ENCOUNTER — Encounter: Payer: Self-pay | Admitting: Family Medicine

## 2013-02-10 ENCOUNTER — Ambulatory Visit (INDEPENDENT_AMBULATORY_CARE_PROVIDER_SITE_OTHER): Payer: Medicare Other | Admitting: Family Medicine

## 2013-02-10 VITALS — BP 110/68 | HR 74 | Temp 98.0°F | Resp 16 | Wt 214.0 lb

## 2013-02-10 DIAGNOSIS — F411 Generalized anxiety disorder: Secondary | ICD-10-CM

## 2013-02-10 NOTE — Progress Notes (Signed)
Subjective:    Patient ID: [name], male    DOB: 02-10-1936, 77 y.o.   MRN: [id number]  HPI Patient's hemoglobin A1c in June was good at 6.6. He's currently taking metformin 1000 mg by mouth twice a day, Januvia 100 mg by mouth daily, and Glucotrol XL 10 mg by mouth daily. His fasting blood sugars range 100-110, and his two-hour postprandial sugars range 100-150. Unfortunately the metformin is giving him terrible diarrhea and crampy abdominal pain. He would like to decrease the dose of the metformin and try to adjust his diet to control his sugars. His last office visit, I started the patient on Lexapro for generalized anxiety disorder. Patient states he feels much better and he is adamant that he wants to discontinue the medicine because he felt like it also is upsetting his stomach. He denies any depression, suicidal ideation, or panic attacks.. Past Medical History  Diagnosis Date  . COPD (chronic obstructive pulmonary disease)   . Hx-TIA (transient ischemic attack)   . Diabetes mellitus   . Hypertension   . Hyperlipidemia   . Anxiety    Current Outpatient Prescriptions on File Prior to Visit  Medication Sig Dispense Refill  . albuterol (PROVENTIL) (2.5 MG/3ML) 0.083% nebulizer solution Take 2.5 mg by nebulization every 6 (six) hours as needed. Shortness of breath.      [name] Kitchen amLODipine (NORVASC) 2.5 MG tablet Take 1 tablet (2.5 mg total) by mouth daily.  30 tablet  3  . aspirin EC 81 MG tablet Take 81 mg by mouth daily.      . Blood Glucose Monitoring Suppl MISC by Does not apply route. Clever Choice Voice Strips and Lancets      . escitalopram (LEXAPRO) 10 MG tablet Take 1 tablet (10 mg total) by mouth daily.  30 tablet  3  . Fluticasone-Salmeterol (ADVAIR) 250-50 MCG/DOSE AEPB Inhale 1 puff into the lungs every 12 (twelve) hours.  60 each  11  . glipiZIDE (GLUCOTROL XL) 10 MG 24 hr tablet Take 10 mg by mouth daily.      . metFORMIN (GLUCOPHAGE) 1000 MG tablet TAKE 1 TABLET TWICE DAILY   60 tablet  3  . montelukast (SINGULAIR) 10 MG tablet Take 10 mg by mouth at bedtime.      [name] Kitchen olmesartan (BENICAR) 40 MG tablet Take 40 mg by mouth daily.      [name] Kitchen omeprazole (PRILOSEC) 40 MG capsule Take 40 mg by mouth daily.      . sitaGLIPtin (JANUVIA) 100 MG tablet Take 1 tablet (100 mg total) by mouth daily.  30 tablet  5   No current facility-administered medications on file prior to visit.   No Known Allergies History   Social History  . Marital Status: Married    Spouse Name: N/A    Number of Children: N/A  . Years of Education: N/A   Occupational History  . Not on file.   Social History Main Topics  . Smoking status: Never Smoker   . Smokeless tobacco: Not on file  . Alcohol Use: No  . Drug Use: No  . Sexually Active:    Other Topics Concern  . Not on file   Social History Narrative  . No narrative on file      Review of Systems  All other systems reviewed and are negative.       Objective:   Physical Exam  Neck: Neck supple. No thyromegaly present.  Cardiovascular: Normal rate, regular rhythm, normal heart sounds and  intact distal pulses.   No murmur heard. Pulmonary/Chest: Effort normal and breath sounds normal. No respiratory distress. He has no wheezes. He has no rales.  Abdominal: Soft. Bowel sounds are normal. He exhibits no distension. There is no tenderness. There is no rebound.  Lymphadenopathy:    He has no cervical adenopathy.          Assessment & Plan:  1. GAD (generalized anxiety disorder) Decrease Lexapro to 5 mg a day for 2 weeks and then 5 mg every other day for 2 weeks and then stop.  If symptoms return we will need to resume the Lexapro  2. Type II or unspecified type diabetes mellitus without mention of complication, uncontrolled Decrease metformin to 500 mg by mouth twice a day and monitor his sugars closely. His fasting blood sugar should be less than 1:30 and his postprandial sugar should be less than 160. Recheck hemoglobin  A1c in 3 months. Follow up sooner if sugars are higher than the goals that I have set forth.

## 2013-02-10 NOTE — Patient Instructions (Addendum)
escitalopram-take 1/2 a pill a day for 2 weeks then 1/2 a pill every other day for 2 weeks, then stop.

## 2013-02-19 ENCOUNTER — Emergency Department (HOSPITAL_COMMUNITY)
Admission: EM | Admit: 2013-02-19 | Discharge: 2013-02-20 | Disposition: A | Discharge status: Home / Self Care | Payer: Medicare Other | Attending: Emergency Medicine | Admitting: Emergency Medicine

## 2013-02-19 ENCOUNTER — Encounter (HOSPITAL_COMMUNITY): Payer: Self-pay | Admitting: *Deleted

## 2013-02-19 DIAGNOSIS — Z8673 Personal history of transient ischemic attack (TIA), and cerebral infarction without residual deficits: Secondary | ICD-10-CM | POA: Insufficient documentation

## 2013-02-19 DIAGNOSIS — F411 Generalized anxiety disorder: Secondary | ICD-10-CM | POA: Insufficient documentation

## 2013-02-19 DIAGNOSIS — R1013 Epigastric pain: Secondary | ICD-10-CM | POA: Insufficient documentation

## 2013-02-19 DIAGNOSIS — J449 Chronic obstructive pulmonary disease, unspecified: Secondary | ICD-10-CM | POA: Insufficient documentation

## 2013-02-19 DIAGNOSIS — E119 Type 2 diabetes mellitus without complications: Secondary | ICD-10-CM | POA: Insufficient documentation

## 2013-02-19 DIAGNOSIS — I10 Essential (primary) hypertension: Secondary | ICD-10-CM | POA: Insufficient documentation

## 2013-02-19 DIAGNOSIS — Z8639 Personal history of other endocrine, nutritional and metabolic disease: Secondary | ICD-10-CM | POA: Insufficient documentation

## 2013-02-19 DIAGNOSIS — K8020 Calculus of gallbladder without cholecystitis without obstruction: Secondary | ICD-10-CM | POA: Insufficient documentation

## 2013-02-19 DIAGNOSIS — Z79899 Other long term (current) drug therapy: Secondary | ICD-10-CM | POA: Insufficient documentation

## 2013-02-19 DIAGNOSIS — Z862 Personal history of diseases of the blood and blood-forming organs and certain disorders involving the immune mechanism: Secondary | ICD-10-CM | POA: Insufficient documentation

## 2013-02-19 DIAGNOSIS — Z7982 Long term (current) use of aspirin: Secondary | ICD-10-CM | POA: Insufficient documentation

## 2013-02-19 DIAGNOSIS — J4489 Other specified chronic obstructive pulmonary disease: Secondary | ICD-10-CM | POA: Insufficient documentation

## 2013-02-19 MED ORDER — ASPIRIN 81 MG PO CHEW
324.0000 mg | CHEWABLE_TABLET | Freq: Once | ORAL | Status: AC
  Administered 2013-02-20: 324 mg via ORAL
  Filled 2013-02-19: qty 4

## 2013-02-19 MED ORDER — GI COCKTAIL ~~LOC~~
30.0000 mL | Freq: Once | ORAL | Status: AC
  Administered 2013-02-20: 30 mL via ORAL
  Filled 2013-02-19: qty 30

## 2013-02-19 NOTE — ED Provider Notes (Signed)
CSN: [id number]     Arrival date & time 02/19/13  2335 History     First MD Initiated Contact with Patient 02/19/13 2350     Chief Complaint  Patient presents with  . Chest Pain    pain after eating pinto beans at 7pm   (Consider location/radiation/quality/duration/timing/severity/associated sxs/prior Treatment) HPI HPI Comments: [name] is a 77 y.o. male who presents to the Emergency Department complaining of  Radiating, constant ,severe, chest pain that radiates to his back with onset of one hour PTA while sitting watching a basketball game and eating. Pt describes the pain as aching and presents with a 8/10 severity. Pt denies eating any usual foods. Pt denies SOB, diaphoesis, and nausea. Pt has a hx of COPD, diabetes, hypertension and anxiety. Pt has a hx of mini-stroke. He denies that anything relieve or worsens the pain.   Past Medical History  Diagnosis Date  . COPD (chronic obstructive pulmonary disease)   . Hx-TIA (transient ischemic attack)   . Diabetes mellitus   . Hypertension   . Hyperlipidemia   . Anxiety    Past Surgical History  Procedure Laterality Date  . Rotator cuff repair     History reviewed. No pertinent family history. History  Substance Use Topics  . Smoking status: Never Smoker   . Smokeless tobacco: Not on file  . Alcohol Use: No    Review of Systems  Constitutional: Negative for diaphoresis.  Respiratory: Negative for shortness of breath.   Cardiovascular: Positive for chest pain.  Gastrointestinal: Negative for nausea and vomiting.  All other systems reviewed and are negative.    Allergies  Review of patient's allergies indicates no known allergies.  Home Medications   Current Outpatient Rx  Name  Route  Sig  Dispense  Refill  . metFORMIN (GLUCOPHAGE) 1000 MG tablet      TAKE 1 TABLET TWICE DAILY   60 tablet   3   . albuterol (PROVENTIL) (2.5 MG/3ML) 0.083% nebulizer solution   Nebulization   Take 2.5 mg by nebulization  every 6 (six) hours as needed. Shortness of breath.         [name] Kitchen amLODipine (NORVASC) 2.5 MG tablet   Oral   Take 1 tablet (2.5 mg total) by mouth daily.   30 tablet   3   . aspirin EC 81 MG tablet   Oral   Take 81 mg by mouth daily.         . Blood Glucose Monitoring Suppl MISC   Does not apply   by Does not apply route. Clever Choice Voice Strips and Lancets         . escitalopram (LEXAPRO) 10 MG tablet   Oral   Take 1 tablet (10 mg total) by mouth daily.   30 tablet   3   . Fluticasone-Salmeterol (ADVAIR) 250-50 MCG/DOSE AEPB   Inhalation   Inhale 1 puff into the lungs every 12 (twelve) hours.   60 each   11   . glipiZIDE (GLUCOTROL XL) 10 MG 24 hr tablet   Oral   Take 10 mg by mouth daily.         . montelukast (SINGULAIR) 10 MG tablet   Oral   Take 10 mg by mouth at bedtime.         [name] Kitchen olmesartan (BENICAR) 40 MG tablet   Oral   Take 40 mg by mouth daily.         [name] Kitchen omeprazole (PRILOSEC) 40 MG capsule  Oral   Take 40 mg by mouth daily.         . sitaGLIPtin (JANUVIA) 100 MG tablet   Oral   Take 1 tablet (100 mg total) by mouth daily.   30 tablet   5    BP 193/100  Pulse 68  Temp(Src) 98.6 F (37 C) (Oral)  Resp 20  Ht 6' (1.829 m)  Wt 214 lb (97.07 kg)  BMI 29.02 kg/m2  SpO2 99% Physical Exam  Nursing note and vitals reviewed. Constitutional: He is oriented to person, place, and time. He appears well-developed and well-nourished. No distress.  HENT:  Head: Normocephalic and atraumatic. Head is without raccoon's eyes and without Battle's sign.  Nose: Nose normal.  Eyes: Conjunctivae and EOM are normal. Pupils are equal, round, and reactive to light. No scleral icterus.  Neck: Normal range of motion. Neck supple. No JVD present. No spinous process tenderness and no muscular tenderness present.  Cardiovascular: Normal rate, regular rhythm, normal heart sounds and intact distal pulses.   No murmur heard. Pulmonary/Chest: Effort normal  and breath sounds normal. He has no wheezes. He has no rales. He exhibits no tenderness.  Abdominal: Soft. There is tenderness. There is no rebound and no guarding.  There is moderate epigastric tenderness. No rebound tenderness and no guarding present.  Musculoskeletal: Normal range of motion. He exhibits no edema and no tenderness.       Thoracic back: He exhibits no tenderness and no bony tenderness.       Lumbar back: He exhibits no tenderness and no bony tenderness.  Lymphadenopathy:    He has no cervical adenopathy.  Neurological: He is alert and oriented to person, place, and time. He exhibits normal muscle tone. Coordination normal.  Skin: Skin is warm and dry. No rash noted. He is not diaphoretic.  Psychiatric: He has a normal mood and affect. His behavior is normal. Judgment and thought content normal.    ED Course  DIAGNOSTIC STUDIES: Oxygen Saturation is 99% on room air, normal by my interpretation.    COORDINATION OF CARE: 11:59 PM Discussed course of care with pt . Pt understands and agrees.    Procedures (including critical care time)  Results for orders placed during the hospital encounter of 02/19/13  TROPONIN I      Result Value Range   Troponin I <0.30  <0.30 ng/mL  CBC      Result Value Range   WBC 6.4  4.0 - 10.5 K/uL   RBC 4.22  4.22 - 5.81 MIL/uL   Hemoglobin 13.2  13.0 - 17.0 g/dL   HCT 45.4 (*) 09.8 - 11.9 %   MCV 86.3  78.0 - 100.0 fL   MCH 31.3  26.0 - 34.0 pg   MCHC 36.3 (*) 30.0 - 36.0 g/dL   RDW 14.7  82.9 - 56.2 %   Platelets 204  150 - 400 K/uL  BASIC METABOLIC PANEL      Result Value Range   Sodium 137  135 - 145 mEq/L   Potassium 3.8  3.5 - 5.1 mEq/L   Chloride 102  96 - 112 mEq/L   CO2 20  19 - 32 mEq/L   Glucose, Bld 256 (*) 70 - 99 mg/dL   BUN 10  6 - 23 mg/dL   Creatinine, Ser 1.30  0.50 - 1.35 mg/dL   Calcium 9.2  8.4 - 86.5 mg/dL   GFR calc non Af Amer 60 (*) >90 mL/min   GFR calc  Af Amer 69 (*) >90 mL/min  HEPATIC FUNCTION  PANEL      Result Value Range   Total Protein 7.9  6.0 - 8.3 g/dL   Albumin 4.1  3.5 - 5.2 g/dL   AST 20  0 - 37 U/L   ALT 26  0 - 53 U/L   Alkaline Phosphatase 83  39 - 117 U/L   Total Bilirubin 0.4  0.3 - 1.2 mg/dL   Bilirubin, Direct <4.0  0.0 - 0.3 mg/dL   Indirect Bilirubin NOT CALCULATED  0.3 - 0.9 mg/dL  LIPASE, BLOOD      Result Value Range   Lipase 58  11 - 59 U/L  TROPONIN I      Result Value Range   Troponin I <0.30  <0.30 ng/mL   US Abdomen Complete  02/20/2013   *RADIOLOGY REPORT*  Clinical Data:  Abdominal pain.  Evidence of choledocholithiasis and cholelithiasis on the current CT.  COMPLETE ABDOMINAL ULTRASOUND  Comparison:  Abdomen CT, 02/20/2013  Findings:  Gallbladder:  Multiple small gallstones are noted along with a small amount of sludge.  The wall is not well defined, but does not appear thickened.  There is some low echogenicity in the liver adjacent to the gallbladder that is likely focal fatty sparing.  No pericholecystic fluid.  There is no sonographic Murphy's sign.  Common bile duct:  Common bile duct measures 4.6 mm proximally.  It is not well seen distally.  No duct stone is evident, but a stone in the more distal duct is not excluded on this exam.  Liver:  Liver is echogenic with attenuation of the sound beam, findings consistent with diffuse fatty infiltration.  No liver mass or focal lesion is seen.  As noted above, there is some focal fatty sparing along the gallbladder fossa.  IVC:  Appears normal.  Pancreas:  Pancreas is poorly visualized and cannot be accurately assessed.  Of note, it was normal on CT.  Spleen:  Normal in size echogenicity measuring 7.7 cm.  Right Kidney:  Right kidney demonstrates a 2.4 cm mid pole cyst, but is otherwise unremarkable measuring 10.8 cm in length.  Left Kidney:  Normal measuring 11.3 cm in length.  Abdominal aorta:  No aneurysm identified.  IMPRESSION: Cholelithiasis, but no ultrasound evidence of acute cholecystitis.  No bile  duct dilation or bile duct stone is seen.  The distal bile duct was not visualized.  Incomplete visualization of the pancreas.  Extensive fatty infiltration of the liver with some focal fatty sparing along the gallbladder fossa.  Right mid pole renal cyst.   Original Report Authenticated By: Amie Portland, M.D.   Ct Abdomen Pelvis W Contrast  02/20/2013   *RADIOLOGY REPORT*  Clinical Data: Abdominal pain.  CT ABDOMEN AND PELVIS WITH CONTRAST  Technique:  Multidetector CT imaging of the abdomen and pelvis was performed following the standard protocol during bolus administration of intravenous contrast.  Contrast: 50mL OMNIPAQUE IOHEXOL 300 MG/ML  SOLN, OMNIPAQUE IOHEXOL 300 MG/ML  SOLN  Comparison: None.  Findings:  BODY WALL: Unremarkable.  LOWER CHEST:  Mediastinum: Coronary artery atherosclerosis.  Small sliding-type hiatal hernia.  Lungs/pleura: No consolidation.  ABDOMEN/PELVIS:  Liver: Diffuse low attenuation consistent with steatosis.  Biliary: Layering high-density material, including in the neck or cystic duct of the gallbladder, consistent with stones.  No definite pericholecystic inflammation, although patient motion limits assessment of the gallbladder wall.  Pancreas: Unremarkable.  Spleen: Unremarkable.  Adrenals: 1 cm myelolipoma in the left adrenal  gland.  Kidneys and ureters: 2 cm cyst in the interpolar right kidney. Another low attenuation focus in the posterior hilar lip on the right is too small to characterize, although also likely a cyst.  Bladder: Unremarkable.  Bowel: No obstruction. Negative for appendicitis.  Colonic diverticulosis without diverticulitis.  Sliding-type hiatal hernia as above.  Retroperitoneum: Minimal haziness of the small bowel mesentery.  Peritoneum: No free fluid or gas.  Reproductive: Unremarkable.  Vascular: No acute abnormality.  OSSEOUS: No acute abnormalities. Advanced lower lumbar degenerative disc and facet disease, with facet overgrowth causing notable  spinal canal stenosis at L4-5.  L5-S1 degenerative disc disease, advanced, with foraminal stenoses.  IMPRESSION: 1.Cholelithiasis with stones in the gallbladder neck, and possibly the cystic duct.  2.  Incidental findings include coronary artery atherosclerosis, colonic diverticulosis, and hepatic steatosis.   Original Report Authenticated By: Tiburcio Pea   Dg Chest Port 1 View  02/20/2013   *RADIOLOGY REPORT*  Clinical Data: Chest pain.  PORTABLE CHEST - 1 VIEW  Comparison: Chest x-ray 07/18/2012.  Findings: Lung volumes are normal.  No consolidative airspace disease.  No pleural effusions.  No pneumothorax.  No pulmonary nodule or mass noted.  Pulmonary vasculature and the cardiomediastinal silhouette are within normal limits.  IMPRESSION: 1. No radiographic evidence of acute cardiopulmonary disease.   Original Report Authenticated By: Trudie Reed, M.D.     Date: 02/20/2013  Rate: 66  Rhythm: normal sinus rhythm  QRS Axis: normal  Intervals: normal  ST/T Wave abnormalities: nonspecific T wave changes  Conduction Disutrbances:none  Narrative Interpretation: Nonspecific T wave flattening. When compared with ECG of 11/30/2010, no significant changes are seen.  Old EKG Reviewed: unchanged     1. Epigastric pain   2. Cholelithiasis     MDM  Chest/abdominal pain. Source of the pain seems to be in the epigastric area. I suspect peptic ulcer disease or gastritis. Cardiac evaluation will be done including ECG and troponins and will be given a trial of a GI cocktail.  He got no relief with a GI cocktail. He is given a dose of pantoprazole and is also given nitroglycerin. He got no relief of pain with this is so the decision was made to proceed with CT of the abdomen. Repeat troponin is negative. Abdominal CT has shown evidence of gallstones with questionable stones in the cystic duct. He'll be sent for ultrasound.  Ultrasound shows cholelithiasis without evidence of choledocholithiasis.  Patient is reexamined and he continues to have epigastric and now some right upper quadrant tenderness. Surgery consultation will be obtained.  I personally performed the services described in this documentation, which was scribed in my presence. The recorded information has been reviewed and is accurate.      Dione Booze, MD 02/20/13 [phone]

## 2013-02-19 NOTE — ED Notes (Signed)
Patient states he developed chest pain after eating a little pinto beans tonight

## 2013-02-20 ENCOUNTER — Emergency Department (HOSPITAL_COMMUNITY): Payer: Medicare Other

## 2013-02-20 LAB — BASIC METABOLIC PANEL
BUN: 10 mg/dL (ref 6–23)
Calcium: 9.2 mg/dL (ref 8.4–10.5)
Creatinine, Ser: 1.17 mg/dL (ref 0.50–1.35)
GFR calc non Af Amer: 60 mL/min — ABNORMAL LOW (ref >90)
Glucose, Bld: 256 mg/dL — ABNORMAL HIGH (ref 70–99)
Sodium: 137 mEq/L (ref 135–145)

## 2013-02-20 LAB — CBC
HCT: 36.4 % — ABNORMAL LOW (ref 39.0–52.0)
Hemoglobin: 13.2 g/dL (ref 13.0–17.0)
MCH: 31.3 pg (ref 26.0–34.0)
MCHC: 36.3 g/dL — ABNORMAL HIGH (ref 30.0–36.0)
MCV: 86.3 fL (ref 78.0–100.0)

## 2013-02-20 LAB — HEPATIC FUNCTION PANEL
ALT: 26 U/L (ref 0–53)
AST: 20 U/L (ref 0–37)
Alkaline Phosphatase: 83 U/L (ref 39–117)
Bilirubin, Direct: <0.1 mg/dL (ref 0.0–0.3)
Total Bilirubin: 0.4 mg/dL (ref 0.3–1.2)

## 2013-02-20 LAB — TROPONIN I: Troponin I: <0.3 ng/mL (ref <0.30)

## 2013-02-20 MED ORDER — MORPHINE SULFATE 4 MG/ML IJ SOLN
4.0000 mg | Freq: Once | INTRAMUSCULAR | Status: AC
  Administered 2013-02-20: 4 mg via INTRAVENOUS
  Filled 2013-02-20: qty 1

## 2013-02-20 MED ORDER — ONDANSETRON HCL 4 MG/2ML IJ SOLN
INTRAMUSCULAR | Status: AC
  Administered 2013-02-20: 4 mg
  Filled 2013-02-20: qty 2

## 2013-02-20 MED ORDER — IOHEXOL 300 MG/ML  SOLN
50.0000 mL | Freq: Once | INTRAMUSCULAR | Status: AC | PRN
  Administered 2013-02-20: 50 mL via INTRAVENOUS

## 2013-02-20 MED ORDER — HYDROMORPHONE HCL PF 1 MG/ML IJ SOLN
1.0000 mg | Freq: Once | INTRAMUSCULAR | Status: AC
  Administered 2013-02-20: 1 mg via INTRAVENOUS
  Filled 2013-02-20: qty 1

## 2013-02-20 MED ORDER — IOHEXOL 300 MG/ML  SOLN
100.0000 mL | Freq: Once | INTRAMUSCULAR | Status: AC | PRN
  Administered 2013-02-20: 100 mL via INTRAVENOUS

## 2013-02-20 MED ORDER — ONDANSETRON HCL 4 MG/2ML IJ SOLN
4.0000 mg | Freq: Once | INTRAMUSCULAR | Status: AC
  Administered 2013-02-20: 4 mg via INTRAVENOUS
  Filled 2013-02-20: qty 2

## 2013-02-20 MED ORDER — NITROGLYCERIN 0.4 MG SL SUBL
0.4000 mg | SUBLINGUAL_TABLET | SUBLINGUAL | Status: AC | PRN
  Administered 2013-02-20 (×3): 0.4 mg via SUBLINGUAL
  Filled 2013-02-20: qty 25

## 2013-02-20 NOTE — ED Notes (Signed)
Patient to ultrasound

## 2013-02-20 NOTE — ED Provider Notes (Signed)
Patient seen by Dr. Lovell Sheehan in the emergency department. Patient is now pain-free and nontender Dr. Lovell Sheehan did offer to electively remove the gallbladder today but patient prefers to do it at another time he will followup in his office. Patient given per cautions.  Shelda Jakes, MD 02/20/13 [phone]

## 2013-02-20 NOTE — ED Notes (Signed)
Report from night shift RN, Patient pulled IV out and is non-compliant with cardiac monitoring. Patient states he "wants to go home"

## 2013-02-20 NOTE — ED Notes (Signed)
MD at bedside. 

## 2013-02-20 NOTE — ED Notes (Signed)
Patient with no complaints at this time. Respirations even and unlabored. Skin warm/dry. Discharge instructions reviewed with patient at this time. Patient given opportunity to voice concerns/ask questions. Patient discharged at this time and left Emergency Department with steady gait.   

## 2013-02-20 NOTE — Consult Note (Signed)
Reason for Consult: Right upper quadrant abdominal pain Referring Physician: Emergency room  [name] is an 77 y.o. male.  HPI:  Patient is a pleasant 77 year old black male who presented emergency room yesterday evening with right upper quadrant abdominal pain. CT scan of the abdomen as well as ultrasound of the right upper quadrant revealed cholelithiasis with a normal common bile duct. He does have evidence of a fatty liver. He states that he currently has no right upper quadrant abdominal pain. This is his first episode. He denies any fever, chills, or jaundice.  Past Medical History  Diagnosis Date  . COPD (chronic obstructive pulmonary disease)   . Hx-TIA (transient ischemic attack)   . Diabetes mellitus   . Hypertension   . Hyperlipidemia   . Anxiety     Past Surgical History  Procedure Laterality Date  . Rotator cuff repair      History reviewed. No pertinent family history.  Social History:  reports that he has never smoked. He does not have any smokeless tobacco history on file. He reports that he does not drink alcohol or use illicit drugs.  Allergies:  Allergies  Allergen Reactions  . Ace Inhibitors     Medications: I have reviewed the patient's current medications.  Results for orders placed during the hospital encounter of 02/19/13 (from the past 48 hour(s))  TROPONIN I     Status: None   Collection Time    02/19/13 11:56 PM      Result Value Range   Troponin I <0.30  <0.30 ng/mL   Comment:            Due to the release kinetics of cTnI,     a negative result within the first hours     of the onset of symptoms does not rule out     myocardial infarction with certainty.     If myocardial infarction is still suspected,     repeat the test at appropriate intervals.  CBC     Status: Abnormal   Collection Time    02/19/13 11:56 PM      Result Value Range   WBC 6.4  4.0 - 10.5 K/uL   RBC 4.22  4.22 - 5.81 MIL/uL   Hemoglobin 13.2  13.0 - 17.0 g/dL   HCT 16.1 (*) 09.6 - 04.5 %   MCV 86.3  78.0 - 100.0 fL   MCH 31.3  26.0 - 34.0 pg   MCHC 36.3 (*) 30.0 - 36.0 g/dL   RDW 40.9  81.1 - 91.4 %   Platelets 204  150 - 400 K/uL  BASIC METABOLIC PANEL     Status: Abnormal   Collection Time    02/19/13 11:56 PM      Result Value Range   Sodium 137  135 - 145 mEq/L   Potassium 3.8  3.5 - 5.1 mEq/L   Chloride 102  96 - 112 mEq/L   CO2 20  19 - 32 mEq/L   Glucose, Bld 256 (*) 70 - 99 mg/dL   BUN 10  6 - 23 mg/dL   Creatinine, Ser 7.82  0.50 - 1.35 mg/dL   Calcium 9.2  8.4 - 95.6 mg/dL   GFR calc non Af Amer 60 (*) >90 mL/min   GFR calc Af Amer 69 (*) >90 mL/min   Comment: (NOTE)     The eGFR has been calculated using the CKD EPI equation.     This calculation has not been validated in  all clinical situations.     eGFR's persistently <90 mL/min signify possible Chronic Kidney     Disease.  HEPATIC FUNCTION PANEL     Status: None   Collection Time    02/19/13 11:56 PM      Result Value Range   Total Protein 7.9  6.0 - 8.3 g/dL   Albumin 4.1  3.5 - 5.2 g/dL   AST 20  0 - 37 U/L   ALT 26  0 - 53 U/L   Alkaline Phosphatase 83  39 - 117 U/L   Total Bilirubin 0.4  0.3 - 1.2 mg/dL   Bilirubin, Direct <9.1  0.0 - 0.3 mg/dL   Indirect Bilirubin NOT CALCULATED  0.3 - 0.9 mg/dL  LIPASE, BLOOD     Status: None   Collection Time    02/19/13 11:56 PM      Result Value Range   Lipase 58  11 - 59 U/L  TROPONIN I     Status: None   Collection Time    02/20/13  4:21 AM      Result Value Range   Troponin I <0.30  <0.30 ng/mL   Comment:            Due to the release kinetics of cTnI,     a negative result within the first hours     of the onset of symptoms does not rule out     myocardial infarction with certainty.     If myocardial infarction is still suspected,     repeat the test at appropriate intervals.    US Abdomen Complete  02/20/2013   *RADIOLOGY REPORT*  Clinical Data:  Abdominal pain.  Evidence of choledocholithiasis and  cholelithiasis on the current CT.  COMPLETE ABDOMINAL ULTRASOUND  Comparison:  Abdomen CT, 02/20/2013  Findings:  Gallbladder:  Multiple small gallstones are noted along with a small amount of sludge.  The wall is not well defined, but does not appear thickened.  There is some low echogenicity in the liver adjacent to the gallbladder that is likely focal fatty sparing.  No pericholecystic fluid.  There is no sonographic Murphy's sign.  Common bile duct:  Common bile duct measures 4.6 mm proximally.  It is not well seen distally.  No duct stone is evident, but a stone in the more distal duct is not excluded on this exam.  Liver:  Liver is echogenic with attenuation of the sound beam, findings consistent with diffuse fatty infiltration.  No liver mass or focal lesion is seen.  As noted above, there is some focal fatty sparing along the gallbladder fossa.  IVC:  Appears normal.  Pancreas:  Pancreas is poorly visualized and cannot be accurately assessed.  Of note, it was normal on CT.  Spleen:  Normal in size echogenicity measuring 7.7 cm.  Right Kidney:  Right kidney demonstrates a 2.4 cm mid pole cyst, but is otherwise unremarkable measuring 10.8 cm in length.  Left Kidney:  Normal measuring 11.3 cm in length.  Abdominal aorta:  No aneurysm identified.  IMPRESSION: Cholelithiasis, but no ultrasound evidence of acute cholecystitis.  No bile duct dilation or bile duct stone is seen.  The distal bile duct was not visualized.  Incomplete visualization of the pancreas.  Extensive fatty infiltration of the liver with some focal fatty sparing along the gallbladder fossa.  Right mid pole renal cyst.   Original Report Authenticated By: Amie Portland, M.D.   Ct Abdomen Pelvis W Contrast  02/20/2013   *  RADIOLOGY REPORT*  Clinical Data: Abdominal pain.  CT ABDOMEN AND PELVIS WITH CONTRAST  Technique:  Multidetector CT imaging of the abdomen and pelvis was performed following the standard protocol during bolus administration of  intravenous contrast.  Contrast: 50mL OMNIPAQUE IOHEXOL 300 MG/ML  SOLN, OMNIPAQUE IOHEXOL 300 MG/ML  SOLN  Comparison: None.  Findings:  BODY WALL: Unremarkable.  LOWER CHEST:  Mediastinum: Coronary artery atherosclerosis.  Small sliding-type hiatal hernia.  Lungs/pleura: No consolidation.  ABDOMEN/PELVIS:  Liver: Diffuse low attenuation consistent with steatosis.  Biliary: Layering high-density material, including in the neck or cystic duct of the gallbladder, consistent with stones.  No definite pericholecystic inflammation, although patient motion limits assessment of the gallbladder wall.  Pancreas: Unremarkable.  Spleen: Unremarkable.  Adrenals: 1 cm myelolipoma in the left adrenal gland.  Kidneys and ureters: 2 cm cyst in the interpolar right kidney. Another low attenuation focus in the posterior hilar lip on the right is too small to characterize, although also likely a cyst.  Bladder: Unremarkable.  Bowel: No obstruction. Negative for appendicitis.  Colonic diverticulosis without diverticulitis.  Sliding-type hiatal hernia as above.  Retroperitoneum: Minimal haziness of the small bowel mesentery.  Peritoneum: No free fluid or gas.  Reproductive: Unremarkable.  Vascular: No acute abnormality.  OSSEOUS: No acute abnormalities. Advanced lower lumbar degenerative disc and facet disease, with facet overgrowth causing notable spinal canal stenosis at L4-5.  L5-S1 degenerative disc disease, advanced, with foraminal stenoses.  IMPRESSION: 1.Cholelithiasis with stones in the gallbladder neck, and possibly the cystic duct.  2.  Incidental findings include coronary artery atherosclerosis, colonic diverticulosis, and hepatic steatosis.   Original Report Authenticated By: Tiburcio Pea   Dg Chest Port 1 View  02/20/2013   *RADIOLOGY REPORT*  Clinical Data: Chest pain.  PORTABLE CHEST - 1 VIEW  Comparison: Chest x-ray 07/18/2012.  Findings: Lung volumes are normal.  No consolidative airspace disease.  No  pleural effusions.  No pneumothorax.  No pulmonary nodule or mass noted.  Pulmonary vasculature and the cardiomediastinal silhouette are within normal limits.  IMPRESSION: 1. No radiographic evidence of acute cardiopulmonary disease.   Original Report Authenticated By: Trudie Reed, M.D.    ROS: See chart Blood pressure 187/78, pulse 60, temperature 98.6 F (37 C), temperature source Oral, resp. rate 11, height 6' (1.829 m), weight 97.07 kg (214 lb), SpO2 94.00%. Physical Exam: Pleasant black male in no acute distress. HEENT examination reveals no scleral icterus. Lungs clear to auscultation with equal breath sounds bilaterally. Heart examination reveals a regular rate and rhythm without S3, S4, murmurs. Abdomen soft, nontender, nondistended. No Rigidity is noted. No hepatosplenomegaly is noted.  Assessment/Plan: Impression: Biliary colic, cholelithiasis Plan: I did offer laparoscopic cholecystectomy for the patient today, but he prefers to go home. That is fine with me. This can be done as an outpatient procedure. He will call my office to schedule followup.  Cassiopeia Florentino A 02/20/2013, 8:44 AM

## 2013-02-21 ENCOUNTER — Encounter (HOSPITAL_COMMUNITY): Payer: Self-pay | Admitting: *Deleted

## 2013-02-21 ENCOUNTER — Inpatient Hospital Stay (HOSPITAL_COMMUNITY)
Admission: EM | Admit: 2013-02-21 | Discharge: 2013-03-01 | DRG: 417 | Disposition: A | Discharge status: Home Health | Payer: Medicare Other | Attending: Family Medicine | Admitting: Family Medicine

## 2013-02-21 DIAGNOSIS — N179 Acute kidney failure, unspecified: Secondary | ICD-10-CM

## 2013-02-21 DIAGNOSIS — F05 Delirium due to known physiological condition: Secondary | ICD-10-CM | POA: Diagnosis present

## 2013-02-21 DIAGNOSIS — J449 Chronic obstructive pulmonary disease, unspecified: Secondary | ICD-10-CM | POA: Diagnosis present

## 2013-02-21 DIAGNOSIS — I129 Hypertensive chronic kidney disease with stage 1 through stage 4 chronic kidney disease, or unspecified chronic kidney disease: Secondary | ICD-10-CM | POA: Diagnosis present

## 2013-02-21 DIAGNOSIS — R739 Hyperglycemia, unspecified: Secondary | ICD-10-CM

## 2013-02-21 DIAGNOSIS — E861 Hypovolemia: Secondary | ICD-10-CM | POA: Diagnosis present

## 2013-02-21 DIAGNOSIS — E871 Hypo-osmolality and hyponatremia: Secondary | ICD-10-CM | POA: Diagnosis present

## 2013-02-21 DIAGNOSIS — G9340 Encephalopathy, unspecified: Secondary | ICD-10-CM

## 2013-02-21 DIAGNOSIS — E785 Hyperlipidemia, unspecified: Secondary | ICD-10-CM | POA: Diagnosis present

## 2013-02-21 DIAGNOSIS — E876 Hypokalemia: Secondary | ICD-10-CM | POA: Diagnosis not present

## 2013-02-21 DIAGNOSIS — E119 Type 2 diabetes mellitus without complications: Secondary | ICD-10-CM

## 2013-02-21 DIAGNOSIS — Z79899 Other long term (current) drug therapy: Secondary | ICD-10-CM

## 2013-02-21 DIAGNOSIS — N170 Acute kidney failure with tubular necrosis: Secondary | ICD-10-CM | POA: Diagnosis present

## 2013-02-21 DIAGNOSIS — IMO0001 Reserved for inherently not codable concepts without codable children: Secondary | ICD-10-CM | POA: Diagnosis present

## 2013-02-21 DIAGNOSIS — D649 Anemia, unspecified: Secondary | ICD-10-CM | POA: Diagnosis not present

## 2013-02-21 DIAGNOSIS — K810 Acute cholecystitis: Principal | ICD-10-CM

## 2013-02-21 DIAGNOSIS — E1159 Type 2 diabetes mellitus with other circulatory complications: Secondary | ICD-10-CM | POA: Diagnosis present

## 2013-02-21 DIAGNOSIS — Z7982 Long term (current) use of aspirin: Secondary | ICD-10-CM

## 2013-02-21 DIAGNOSIS — R339 Retention of urine, unspecified: Secondary | ICD-10-CM

## 2013-02-21 DIAGNOSIS — K8050 Calculus of bile duct without cholangitis or cholecystitis without obstruction: Secondary | ICD-10-CM

## 2013-02-21 DIAGNOSIS — N183 Chronic kidney disease, stage 3 unspecified: Secondary | ICD-10-CM | POA: Diagnosis present

## 2013-02-21 DIAGNOSIS — Z8673 Personal history of transient ischemic attack (TIA), and cerebral infarction without residual deficits: Secondary | ICD-10-CM

## 2013-02-21 DIAGNOSIS — J4489 Other specified chronic obstructive pulmonary disease: Secondary | ICD-10-CM | POA: Diagnosis present

## 2013-02-21 DIAGNOSIS — K8020 Calculus of gallbladder without cholecystitis without obstruction: Secondary | ICD-10-CM | POA: Diagnosis present

## 2013-02-21 DIAGNOSIS — T502X5A Adverse effect of carbonic-anhydrase inhibitors, benzothiadiazides and other diuretics, initial encounter: Secondary | ICD-10-CM | POA: Diagnosis present

## 2013-02-21 DIAGNOSIS — I10 Essential (primary) hypertension: Secondary | ICD-10-CM | POA: Diagnosis present

## 2013-02-21 DIAGNOSIS — F411 Generalized anxiety disorder: Secondary | ICD-10-CM | POA: Diagnosis present

## 2013-02-21 LAB — CBC WITH DIFFERENTIAL/PLATELET
Basophils Relative: 0 % (ref 0–1)
Eosinophils Relative: 0 % (ref 0–5)
HCT: 37.2 % — ABNORMAL LOW (ref 39.0–52.0)
Hemoglobin: 13.4 g/dL (ref 13.0–17.0)
Lymphocytes Relative: 6 % — ABNORMAL LOW (ref 12–46)
MCHC: 36 g/dL (ref 30.0–36.0)
MCV: 87.7 fL (ref 78.0–100.0)
Monocytes Absolute: 0.5 10*3/uL (ref 0.1–1.0)
Monocytes Relative: 4 % (ref 3–12)
Neutro Abs: 13.2 10*3/uL — ABNORMAL HIGH (ref 1.7–7.7)

## 2013-02-21 LAB — COMPREHENSIVE METABOLIC PANEL
BUN: 13 mg/dL (ref 6–23)
CO2: 20 mEq/L (ref 19–32)
Chloride: 94 mEq/L — ABNORMAL LOW (ref 96–112)
Creatinine, Ser: 1.27 mg/dL (ref 0.50–1.35)
GFR calc non Af Amer: 54 mL/min — ABNORMAL LOW (ref >90)
Total Bilirubin: 1.6 mg/dL — ABNORMAL HIGH (ref 0.3–1.2)

## 2013-02-21 LAB — TROPONIN I: Troponin I: <0.3 ng/mL (ref <0.30)

## 2013-02-21 LAB — URINALYSIS W MICROSCOPIC + REFLEX CULTURE
Bilirubin Urine: NEGATIVE
Protein, ur: 100 mg/dL — AB
Urobilinogen, UA: 1 mg/dL (ref 0.0–1.0)

## 2013-02-21 LAB — LIPASE, BLOOD: Lipase: 19 U/L (ref 11–59)

## 2013-02-21 LAB — GLUCOSE, CAPILLARY: Glucose-Capillary: 246 mg/dL — ABNORMAL HIGH (ref 70–99)

## 2013-02-21 MED ORDER — INSULIN ASPART 100 UNIT/ML ~~LOC~~ SOLN
0.0000 [IU] | Freq: Every day | SUBCUTANEOUS | Status: DC
  Administered 2013-02-21: 2 [IU] via SUBCUTANEOUS
  Administered 2013-02-22: 3 [IU] via SUBCUTANEOUS
  Administered 2013-02-23 – 2013-02-26 (×3): 2 [IU] via SUBCUTANEOUS
  Administered 2013-02-27: 4 [IU] via SUBCUTANEOUS
  Administered 2013-02-28: 2 [IU] via SUBCUTANEOUS

## 2013-02-21 MED ORDER — ONDANSETRON HCL 4 MG PO TABS: 4.0000 mg | ORAL_TABLET | Freq: Four times a day (QID) | ORAL | Status: DC | PRN

## 2013-02-21 MED ORDER — SODIUM CHLORIDE 0.9 % IV BOLUS (SEPSIS)
500.0000 mL | Freq: Once | INTRAVENOUS | Status: AC
  Administered 2013-02-21: 500 mL via INTRAVENOUS

## 2013-02-21 MED ORDER — HEPARIN SODIUM (PORCINE) 5000 UNIT/ML IJ SOLN
5000.0000 [IU] | Freq: Three times a day (TID) | INTRAMUSCULAR | Status: DC
  Administered 2013-02-21 – 2013-02-23 (×6): 5000 [IU] via SUBCUTANEOUS
  Filled 2013-02-21 (×6): qty 1

## 2013-02-21 MED ORDER — ONDANSETRON HCL 4 MG/2ML IJ SOLN
4.0000 mg | INTRAMUSCULAR | Status: DC | PRN
  Administered 2013-02-21: 4 mg via INTRAVENOUS
  Filled 2013-02-21: qty 2

## 2013-02-21 MED ORDER — MORPHINE SULFATE 4 MG/ML IJ SOLN
4.0000 mg | INTRAMUSCULAR | Status: AC | PRN
  Administered 2013-02-21 – 2013-02-22 (×2): 4 mg via INTRAVENOUS
  Filled 2013-02-21 (×2): qty 1

## 2013-02-21 MED ORDER — CEFAZOLIN SODIUM 1-5 GM-% IV SOLN
1.0000 g | Freq: Three times a day (TID) | INTRAVENOUS | Status: DC
  Administered 2013-02-21 – 2013-02-23 (×6): 1 g via INTRAVENOUS
  Filled 2013-02-21 (×7): qty 50

## 2013-02-21 MED ORDER — ONDANSETRON HCL 4 MG/2ML IJ SOLN: 4.0000 mg | Freq: Four times a day (QID) | INTRAMUSCULAR | Status: DC | PRN

## 2013-02-21 MED ORDER — HYDRALAZINE HCL 20 MG/ML IJ SOLN: 5.0000 mg | INTRAMUSCULAR | Status: DC | PRN

## 2013-02-21 MED ORDER — HALOPERIDOL 2 MG PO TABS
2.5000 mg | ORAL_TABLET | Freq: Once | ORAL | Status: AC
  Administered 2013-02-21: 2.5 mg via ORAL
  Filled 2013-02-21: qty 1

## 2013-02-21 MED ORDER — SODIUM CHLORIDE 0.9 % IV SOLN
INTRAVENOUS | Status: DC
  Administered 2013-02-21: 999 mL via INTRAVENOUS
  Administered 2013-02-21: 23:00:00 via INTRAVENOUS

## 2013-02-21 MED ORDER — CEFAZOLIN SODIUM 1-5 GM-% IV SOLN
INTRAVENOUS | Status: AC
  Filled 2013-02-21: qty 50

## 2013-02-21 MED ORDER — INSULIN ASPART 100 UNIT/ML ~~LOC~~ SOLN
0.0000 [IU] | Freq: Three times a day (TID) | SUBCUTANEOUS | Status: DC
  Administered 2013-02-21: 15 [IU] via SUBCUTANEOUS
  Administered 2013-02-22: 8 [IU] via SUBCUTANEOUS
  Administered 2013-02-22: 11 [IU] via SUBCUTANEOUS
  Administered 2013-02-22: 8 [IU] via SUBCUTANEOUS
  Administered 2013-02-23: 3 [IU] via SUBCUTANEOUS
  Administered 2013-02-23 – 2013-02-24 (×2): 5 [IU] via SUBCUTANEOUS
  Administered 2013-02-25: 3 [IU] via SUBCUTANEOUS
  Administered 2013-02-25: 2 [IU] via SUBCUTANEOUS
  Administered 2013-02-25: 3 [IU] via SUBCUTANEOUS
  Administered 2013-02-26 (×2): 5 [IU] via SUBCUTANEOUS
  Administered 2013-02-27: 3 [IU] via SUBCUTANEOUS
  Administered 2013-02-27: 5 [IU] via SUBCUTANEOUS
  Administered 2013-02-27 – 2013-03-01 (×5): 2 [IU] via SUBCUTANEOUS

## 2013-02-21 MED ORDER — HALOPERIDOL 5 MG PO TABS
ORAL_TABLET | ORAL | Status: AC
  Filled 2013-02-21: qty 1

## 2013-02-21 NOTE — ED Notes (Signed)
Pt and family repeatedly asking for water for patient. Explained once again to patient and family that we would need to hear from surgeon before making that decision.

## 2013-02-21 NOTE — H&P (Signed)
[facility] History and Physical  [name] JYN:[id number] DOB: 07/11/1935 DOA: 02/21/2013  Referring physician: Dr. Clarene Duke, ER. PCP: Leo Grosser, MD    Chief Complaint: Right upper quadrant abdominal pain.  HPI: [name] is a 77 y.o. male who has a history of abdominal pain mostly in the right upper quadrant for the last one week. It has been associated with intermittent nausea and vomiting. He is also diabetic and is sugar is out of control today. He has not been taking his oral hypoglycemic agents as he was instructed not to because of CT scanning. He was in the emergency room yesterday and he was offered admission and laparoscopic cholecystectomy but he declined. He then went home and was in more pain. He now returns again for this purpose. The ER physician has contacted Dr. Lovell Sheehan who will see him and taken to the operating room on this admission.   Review of Systems: Apart from history of present illness, other systems negative.  Past Medical History  Diagnosis Date  . COPD (chronic obstructive pulmonary disease)   . Hx-TIA (transient ischemic attack)   . Diabetes mellitus   . Hypertension   . Hyperlipidemia   . Anxiety    Past Surgical History  Procedure Laterality Date  . Rotator cuff repair     Social History:  reports that he has never smoked. He does not have any smokeless tobacco history on file. He reports that he does not drink alcohol or use illicit drugs.   Allergies  Allergen Reactions  . Ace Inhibitors     No family history on file. noncontributory.   Prior to Admission medications   Medication Sig Start Date End Date Taking? Authorizing Provider  albuterol (PROVENTIL) (2.5 MG/3ML) 0.083% nebulizer solution Take 2.5 mg by nebulization every 6 (six) hours as needed. Shortness of breath.   Yes Historical Provider, MD  amLODipine (NORVASC) 2.5 MG tablet Take 1 tablet (2.5 mg total) by mouth daily. 10/01/12  Yes Donita Brooks, MD   aspirin EC 81 MG tablet Take 81 mg by mouth at bedtime.    Yes Historical Provider, MD  Fluticasone-Salmeterol (ADVAIR) 250-50 MCG/DOSE AEPB Inhale 1 puff into the lungs every 12 (twelve) hours. 11/10/12  Yes Donita Brooks, MD  metFORMIN (GLUCOPHAGE) 1000 MG tablet Take 1,000 mg by mouth 2 (two) times daily.   Yes Historical Provider, MD  montelukast (SINGULAIR) 10 MG tablet Take 10 mg by mouth at bedtime.   Yes Historical Provider, MD  olmesartan (BENICAR) 40 MG tablet Take 40 mg by mouth daily.   Yes Historical Provider, MD  omeprazole (PRILOSEC) 40 MG capsule Take 40 mg by mouth daily.   Yes Historical Provider, MD  sitaGLIPtin (JANUVIA) 100 MG tablet Take 1 tablet (100 mg total) by mouth daily. 10/14/12  Yes Donita Brooks, MD   Physical Exam: Filed Vitals:   02/21/13 1434  BP: 140/71  Pulse: 117  Temp: 98.4 F (36.9 C)  Resp: 20     General:  He looks clinically dehydrated. Is not toxic or septic.  Eyes: No pallor. No jaundice.  ENT: No abnormalities.  Neck: No neck lymphadenopathy.  Cardiovascular: Heart sounds present without murmurs or added sounds.  Respiratory: Lung fields are clear.  Abdomen: Soft, tender in the right upper quadrant especially. Bowel sounds are heard. No masses felt.  Skin: No rash.  Musculoskeletal: No acute joint abnormalities.  Psychiatric: Appropriate affect.  Neurologic: Alert and orientated without any focal neurological signs.  Labs on Admission:  Basic Metabolic Panel:  Recent Labs Lab 02/19/13 2356 02/21/13 1455  NA 137 128*  K 3.8 3.9  CL 102 94*  CO2 20 20  GLUCOSE 256* 449*  BUN 10 13  CREATININE 1.17 1.27  CALCIUM 9.2 9.0   Liver Function Tests:  Recent Labs Lab 02/19/13 2356 02/21/13 1455  AST 20 33  ALT 26 41  ALKPHOS 83 88  BILITOT 0.4 1.6*  PROT 7.9 7.9  ALBUMIN 4.1 3.5    Recent Labs Lab 02/19/13 2356 02/21/13 1455  LIPASE 58 19   No results found for this basename: AMMONIA,  in the last 168  hours CBC:  Recent Labs Lab 02/19/13 2356 02/21/13 1455  WBC 6.4 14.6*  NEUTROABS  --  13.2*  HGB 13.2 13.4  HCT 36.4* 37.2*  MCV 86.3 87.7  PLT 204 184   Cardiac Enzymes:  Recent Labs Lab 02/19/13 2356 02/20/13 0421 02/20/13 0814 02/21/13 1455  TROPONINI <0.30 <0.30 <0.30 <0.30    BNP (last 3 results) No results found for this basename: PROBNP,  in the last 8760 hours CBG: No results found for this basename: GLUCAP,  in the last 168 hours  Radiological Exams on Admission: US Abdomen Complete  02/20/2013   *RADIOLOGY REPORT*  Clinical Data:  Abdominal pain.  Evidence of choledocholithiasis and cholelithiasis on the current CT.  COMPLETE ABDOMINAL ULTRASOUND  Comparison:  Abdomen CT, 02/20/2013  Findings:  Gallbladder:  Multiple small gallstones are noted along with a small amount of sludge.  The wall is not well defined, but does not appear thickened.  There is some low echogenicity in the liver adjacent to the gallbladder that is likely focal fatty sparing.  No pericholecystic fluid.  There is no sonographic Murphy's sign.  Common bile duct:  Common bile duct measures 4.6 mm proximally.  It is not well seen distally.  No duct stone is evident, but a stone in the more distal duct is not excluded on this exam.  Liver:  Liver is echogenic with attenuation of the sound beam, findings consistent with diffuse fatty infiltration.  No liver mass or focal lesion is seen.  As noted above, there is some focal fatty sparing along the gallbladder fossa.  IVC:  Appears normal.  Pancreas:  Pancreas is poorly visualized and cannot be accurately assessed.  Of note, it was normal on CT.  Spleen:  Normal in size echogenicity measuring 7.7 cm.  Right Kidney:  Right kidney demonstrates a 2.4 cm mid pole cyst, but is otherwise unremarkable measuring 10.8 cm in length.  Left Kidney:  Normal measuring 11.3 cm in length.  Abdominal aorta:  No aneurysm identified.  IMPRESSION: Cholelithiasis, but no  ultrasound evidence of acute cholecystitis.  No bile duct dilation or bile duct stone is seen.  The distal bile duct was not visualized.  Incomplete visualization of the pancreas.  Extensive fatty infiltration of the liver with some focal fatty sparing along the gallbladder fossa.  Right mid pole renal cyst.   Original Report Authenticated By: Amie Portland, M.D.   Ct Abdomen Pelvis W Contrast  02/20/2013   *RADIOLOGY REPORT*  Clinical Data: Abdominal pain.  CT ABDOMEN AND PELVIS WITH CONTRAST  Technique:  Multidetector CT imaging of the abdomen and pelvis was performed following the standard protocol during bolus administration of intravenous contrast.  Contrast: 50mL OMNIPAQUE IOHEXOL 300 MG/ML  SOLN, OMNIPAQUE IOHEXOL 300 MG/ML  SOLN  Comparison: None.  Findings:  BODY WALL: Unremarkable.  LOWER  CHEST:  Mediastinum: Coronary artery atherosclerosis.  Small sliding-type hiatal hernia.  Lungs/pleura: No consolidation.  ABDOMEN/PELVIS:  Liver: Diffuse low attenuation consistent with steatosis.  Biliary: Layering high-density material, including in the neck or cystic duct of the gallbladder, consistent with stones.  No definite pericholecystic inflammation, although patient motion limits assessment of the gallbladder wall.  Pancreas: Unremarkable.  Spleen: Unremarkable.  Adrenals: 1 cm myelolipoma in the left adrenal gland.  Kidneys and ureters: 2 cm cyst in the interpolar right kidney. Another low attenuation focus in the posterior hilar lip on the right is too small to characterize, although also likely a cyst.  Bladder: Unremarkable.  Bowel: No obstruction. Negative for appendicitis.  Colonic diverticulosis without diverticulitis.  Sliding-type hiatal hernia as above.  Retroperitoneum: Minimal haziness of the small bowel mesentery.  Peritoneum: No free fluid or gas.  Reproductive: Unremarkable.  Vascular: No acute abnormality.  OSSEOUS: No acute abnormalities. Advanced lower lumbar degenerative disc and  facet disease, with facet overgrowth causing notable spinal canal stenosis at L4-5.  L5-S1 degenerative disc disease, advanced, with foraminal stenoses.  IMPRESSION: 1.Cholelithiasis with stones in the gallbladder neck, and possibly the cystic duct.  2.  Incidental findings include coronary artery atherosclerosis, colonic diverticulosis, and hepatic steatosis.   Original Report Authenticated By: Tiburcio Pea   Dg Chest Port 1 View  02/20/2013   *RADIOLOGY REPORT*  Clinical Data: Chest pain.  PORTABLE CHEST - 1 VIEW  Comparison: Chest x-ray 07/18/2012.  Findings: Lung volumes are normal.  No consolidative airspace disease.  No pleural effusions.  No pneumothorax.  No pulmonary nodule or mass noted.  Pulmonary vasculature and the cardiomediastinal silhouette are within normal limits.  IMPRESSION: 1. No radiographic evidence of acute cardiopulmonary disease.   Original Report Authenticated By: Trudie Reed, M.D.    EKG: Independently reviewed. Normal sinus rhythm, no acute ST-T wave changes.  Assessment/Plan   1. Cholelithiasis, no obvious cholecystitis clinically. However white count is raised. 2. Dehydration. 3. Uncontrolled type 2 diabetes mellitus. 4. Hyponatremia secondary to 2. 5. Hypertension.  Plan: 1. Admit to medical floor. 2. Intravenous fluids. N.p.o. for the time being. 3. Empirical intravenous antibiotics in case there is an element of infection at this point. 4. Sliding scale insulin for diabetic control. 5. Surgical consultation. He will need laparoscopic cholecystectomy.  Code Status: Full code.  Family Communication: Discussed plan with the patient and patient's wife at bedside.   Disposition Plan: Home when medically stable.   Time spent: 45 minutes.  Wilson Singer [facility] Pager [phone].  If 7PM-7AM, please contact night-coverage [email or web address] Password [facility] 02/21/2013, 5:18 PM

## 2013-02-21 NOTE — ED Provider Notes (Signed)
CSN: [id number]     Arrival date & time 02/21/13  1421 History     First MD Initiated Contact with Patient 02/21/13 1444     Chief Complaint  Patient presents with  . Abdominal Pain    HPI Pt was seen at 1500.  Per pt and his family, c/o gradual onset and persistence of constant generalized abd "pain" since yesterday.  Has been associated with multiple intermittent episodes of N/V.  Describes the abd pain as "aching." Pt was evaluated in the ED yesterday for same, dx "gallstones," and was referred for admission. Pt refused to be admitted yesterday because "I felt better." States he returned today because "I'm still sick." Denies diarrhea, no fevers, no back pain, no rash, no CP/SOB, no black or blood in stools or emesis.       Past Medical History  Diagnosis Date  . COPD (chronic obstructive pulmonary disease)   . Hx-TIA (transient ischemic attack)   . Diabetes mellitus   . Hypertension   . Hyperlipidemia   . Anxiety    Past Surgical History  Procedure Laterality Date  . Rotator cuff repair      History  Substance Use Topics  . Smoking status: Never Smoker   . Smokeless tobacco: Not on file  . Alcohol Use: No    Review of Systems ROS: Statement: All systems negative except as marked or noted in the HPI; Constitutional: Negative for fever and chills. ; ; Eyes: Negative for eye pain, redness and discharge. ; ; ENMT: Negative for ear pain, hoarseness, nasal congestion, sinus pressure and sore throat. ; ; Cardiovascular: Negative for chest pain, palpitations, diaphoresis, dyspnea and peripheral edema. ; ; Respiratory: Negative for cough, wheezing and stridor. ; ; Gastrointestinal: +N/V, abd pain. Negative for diarrhea, blood in stool, hematemesis, jaundice and rectal bleeding. . ; ; Genitourinary: Negative for dysuria, flank pain and hematuria. ; ; Musculoskeletal: Negative for back pain and neck pain. Negative for swelling and trauma.; ; Skin: Negative for pruritus, rash, abrasions,  blisters, bruising and skin lesion.; ; Neuro: Negative for headache, lightheadedness and neck stiffness. Negative for weakness, altered level of consciousness , altered mental status, extremity weakness, paresthesias, involuntary movement, seizure and syncope.       Allergies  Ace inhibitors  Home Medications   Current Outpatient Rx  Name  Route  Sig  Dispense  Refill  . albuterol (PROVENTIL) (2.5 MG/3ML) 0.083% nebulizer solution   Nebulization   Take 2.5 mg by nebulization every 6 (six) hours as needed. Shortness of breath.         [name] Kitchen amLODipine (NORVASC) 2.5 MG tablet   Oral   Take 1 tablet (2.5 mg total) by mouth daily.   30 tablet   3   . aspirin EC 81 MG tablet   Oral   Take 81 mg by mouth at bedtime.          . Fluticasone-Salmeterol (ADVAIR) 250-50 MCG/DOSE AEPB   Inhalation   Inhale 1 puff into the lungs every 12 (twelve) hours.   60 each   11   . metFORMIN (GLUCOPHAGE) 1000 MG tablet   Oral   Take 1,000 mg by mouth 2 (two) times daily.         . montelukast (SINGULAIR) 10 MG tablet   Oral   Take 10 mg by mouth at bedtime.         [name] Kitchen olmesartan (BENICAR) 40 MG tablet   Oral   Take 40 mg by mouth  daily.         . omeprazole (PRILOSEC) 40 MG capsule   Oral   Take 40 mg by mouth daily.         . sitaGLIPtin (JANUVIA) 100 MG tablet   Oral   Take 1 tablet (100 mg total) by mouth daily.   30 tablet   5    BP 140/71  Pulse 117  Temp(Src) 98.4 F (36.9 C) (Oral)  Resp 20  Wt 214 lb (97.07 kg)  BMI 29.02 kg/m2  SpO2 95% Physical Exam 1505: Physical examination:  Nursing notes reviewed; Vital signs and O2 SAT reviewed;  Constitutional: Well developed, Well nourished, Well hydrated, Uncomfortable appearing; Head:  Normocephalic, atraumatic; Eyes: EOMI, PERRL, No scleral icterus; ENMT: Mouth and pharynx normal, Mucous membranes moist; Neck: Supple, Full range of motion, No lymphadenopathy; Cardiovascular: Regular rate and rhythm, No gallop;  Respiratory: Breath sounds clear & equal bilaterally, No wheezes.  Speaking full sentences with ease, Normal respiratory effort/excursion; Chest: Nontender, Movement normal; Abdomen: Soft, +RUQ tenderness to palp. No rebound or guarding. Nondistended, Normal bowel sounds; Genitourinary: No CVA tenderness; Extremities: Pulses normal, No tenderness, No edema, No calf edema or asymmetry.; Neuro: AA&Ox3, vague historian. Major CN grossly intact.  Speech clear. No gross focal motor or sensory deficits in extremities.; Skin: Color normal, Warm, Dry.   ED Course   Procedures     MDM  MDM Reviewed: previous chart, nursing note and vitals Reviewed previous: labs, ECG, ultrasound, CT scan and x-ray Interpretation: labs and ECG    Date: 02/21/2013  Rate: 117  Rhythm: sinus tachycardia  QRS Axis: normal  Intervals: normal  ST/T Wave abnormalities: normal  Conduction Disutrbances:none  Narrative Interpretation:   Old EKG Reviewed: unchanged; no significant changes from previous EKG dated 02/19/2013.  Results for orders placed during the hospital encounter of 02/21/13  CBC WITH DIFFERENTIAL      Result Value Range   WBC 14.6 (*) 4.0 - 10.5 K/uL   RBC 4.24  4.22 - 5.81 MIL/uL   Hemoglobin 13.4  13.0 - 17.0 g/dL   HCT 16.1 (*) 09.6 - 04.5 %   MCV 87.7  78.0 - 100.0 fL   MCH 31.6  26.0 - 34.0 pg   MCHC 36.0  30.0 - 36.0 g/dL   RDW 40.9  81.1 - 91.4 %   Platelets 184  150 - 400 K/uL   Neutrophils Relative % 91 (*) 43 - 77 %   Neutro Abs 13.2 (*) 1.7 - 7.7 K/uL   Lymphocytes Relative 6 (*) 12 - 46 %   Lymphs Abs 0.8  0.7 - 4.0 K/uL   Monocytes Relative 4  3 - 12 %   Monocytes Absolute 0.5  0.1 - 1.0 K/uL   Eosinophils Relative 0  0 - 5 %   Eosinophils Absolute 0.0  0.0 - 0.7 K/uL   Basophils Relative 0  0 - 1 %   Basophils Absolute 0.0  0.0 - 0.1 K/uL  COMPREHENSIVE METABOLIC PANEL      Result Value Range   Sodium 128 (*) 135 - 145 mEq/L   Potassium 3.9  3.5 - 5.1 mEq/L   Chloride 94  (*) 96 - 112 mEq/L   CO2 20  19 - 32 mEq/L   Glucose, Bld 449 (*) 70 - 99 mg/dL   BUN 13  6 - 23 mg/dL   Creatinine, Ser 7.82  0.50 - 1.35 mg/dL   Calcium 9.0  8.4 - 95.6 mg/dL  Total Protein 7.9  6.0 - 8.3 g/dL   Albumin 3.5  3.5 - 5.2 g/dL   AST 33  0 - 37 U/L   ALT 41  0 - 53 U/L   Alkaline Phosphatase 88  39 - 117 U/L   Total Bilirubin 1.6 (*) 0.3 - 1.2 mg/dL   GFR calc non Af Amer 54 (*) >90 mL/min   GFR calc Af Amer 63 (*) >90 mL/min  LIPASE, BLOOD      Result Value Range   Lipase 19  11 - 59 U/L  TROPONIN I      Result Value Range   Troponin I <0.30  <0.30 ng/mL  LACTIC ACID, PLASMA      Result Value Range   Lactic Acid, Venous 3.1 (*) 0.5 - 2.2 mmol/L  URINALYSIS W MICROSCOPIC + REFLEX CULTURE      Result Value Range   Color, Urine AMBER (*) YELLOW   APPearance HAZY (*) CLEAR   Specific Gravity, Urine 1.025  1.005 - 1.030   pH 5.5  5.0 - 8.0   Glucose, UA >1000 (*) NEGATIVE mg/dL   Hgb urine dipstick LARGE (*) NEGATIVE   Bilirubin Urine NEGATIVE  NEGATIVE   Ketones, ur TRACE (*) NEGATIVE mg/dL   Protein, ur 161 (*) NEGATIVE mg/dL   Urobilinogen, UA 1.0  0.0 - 1.0 mg/dL   Nitrite NEGATIVE  NEGATIVE   Leukocytes, UA NEGATIVE  NEGATIVE   WBC, UA 0-2  <3 WBC/hpf   RBC / HPF 0-2  <3 RBC/hpf   Bacteria, UA FEW (*) RARE   Squamous Epithelial / LPF FEW (*) RARE   Casts GRANULAR CAST (*) NEGATIVE   US Abdomen Complete 02/20/2013   *RADIOLOGY REPORT*  Clinical Data:  Abdominal pain.  Evidence of choledocholithiasis and cholelithiasis on the current CT.  COMPLETE ABDOMINAL ULTRASOUND  Comparison:  Abdomen CT, 02/20/2013  Findings:  Gallbladder:  Multiple small gallstones are noted along with a small amount of sludge.  The wall is not well defined, but does not appear thickened.  There is some low echogenicity in the liver adjacent to the gallbladder that is likely focal fatty sparing.  No pericholecystic fluid.  There is no sonographic Murphy's sign.  Common bile duct:   Common bile duct measures 4.6 mm proximally.  It is not well seen distally.  No duct stone is evident, but a stone in the more distal duct is not excluded on this exam.  Liver:  Liver is echogenic with attenuation of the sound beam, findings consistent with diffuse fatty infiltration.  No liver mass or focal lesion is seen.  As noted above, there is some focal fatty sparing along the gallbladder fossa.  IVC:  Appears normal.  Pancreas:  Pancreas is poorly visualized and cannot be accurately assessed.  Of note, it was normal on CT.  Spleen:  Normal in size echogenicity measuring 7.7 cm.  Right Kidney:  Right kidney demonstrates a 2.4 cm mid pole cyst, but is otherwise unremarkable measuring 10.8 cm in length.  Left Kidney:  Normal measuring 11.3 cm in length.  Abdominal aorta:  No aneurysm identified.  IMPRESSION: Cholelithiasis, but no ultrasound evidence of acute cholecystitis.  No bile duct dilation or bile duct stone is seen.  The distal bile duct was not visualized.  Incomplete visualization of the pancreas.  Extensive fatty infiltration of the liver with some focal fatty sparing along the gallbladder fossa.  Right mid pole renal cyst.   Original Report Authenticated By: Onalee Hua  Oleta Mouse, M.D.   Ct Abdomen Pelvis W Contrast 02/20/2013   *RADIOLOGY REPORT*  Clinical Data: Abdominal pain.  CT ABDOMEN AND PELVIS WITH CONTRAST  Technique:  Multidetector CT imaging of the abdomen and pelvis was performed following the standard protocol during bolus administration of intravenous contrast.  Contrast: 50mL OMNIPAQUE IOHEXOL 300 MG/ML  SOLN, OMNIPAQUE IOHEXOL 300 MG/ML  SOLN  Comparison: None.  Findings:  BODY WALL: Unremarkable.  LOWER CHEST:  Mediastinum: Coronary artery atherosclerosis.  Small sliding-type hiatal hernia.  Lungs/pleura: No consolidation.  ABDOMEN/PELVIS:  Liver: Diffuse low attenuation consistent with steatosis.  Biliary: Layering high-density material, including in the neck or cystic duct of the  gallbladder, consistent with stones.  No definite pericholecystic inflammation, although patient motion limits assessment of the gallbladder wall.  Pancreas: Unremarkable.  Spleen: Unremarkable.  Adrenals: 1 cm myelolipoma in the left adrenal gland.  Kidneys and ureters: 2 cm cyst in the interpolar right kidney. Another low attenuation focus in the posterior hilar lip on the right is too small to characterize, although also likely a cyst.  Bladder: Unremarkable.  Bowel: No obstruction. Negative for appendicitis.  Colonic diverticulosis without diverticulitis.  Sliding-type hiatal hernia as above.  Retroperitoneum: Minimal haziness of the small bowel mesentery.  Peritoneum: No free fluid or gas.  Reproductive: Unremarkable.  Vascular: No acute abnormality.  OSSEOUS: No acute abnormalities. Advanced lower lumbar degenerative disc and facet disease, with facet overgrowth causing notable spinal canal stenosis at L4-5.  L5-S1 degenerative disc disease, advanced, with foraminal stenoses.  IMPRESSION: 1.Cholelithiasis with stones in the gallbladder neck, and possibly the cystic duct.  2.  Incidental findings include coronary artery atherosclerosis, colonic diverticulosis, and hepatic steatosis.   Original Report Authenticated By: Tiburcio Pea   Dg Chest Port 1 View 02/20/2013   *RADIOLOGY REPORT*  Clinical Data: Chest pain.  PORTABLE CHEST - 1 VIEW  Comparison: Chest x-ray 07/18/2012.  Findings: Lung volumes are normal.  No consolidative airspace disease.  No pleural effusions.  No pneumothorax.  No pulmonary nodule or mass noted.  Pulmonary vasculature and the cardiomediastinal silhouette are within normal limits.  IMPRESSION: 1. No radiographic evidence of acute cardiopulmonary disease.   Original Report Authenticated By: Trudie Reed, M.D.     1615:  Hyperglycemia with hx of same. Na corrects to 134 for glucose. Not acidotic with AG 14. Will dose IVF and recheck CBG. Pt and family are agreeable to stay for  admission today.  T/C to General Surgery Dr. Lovell Sheehan, case discussed, including:  HPI, pertinent PM/SHx, VS/PE, dx testing, ED course and treatment:  Agreeable to consult, requests to admit to hospitalist service.  1700::  T/C to [facility] Dr. Raquel James, case discussed, including:  HPI, pertinent PM/SHx, VS/PE, dx testing, ED course and treatment:  Agreeable to admit, requests he will come to ED for eval.       Laray Anger, DO 02/24/13 1156

## 2013-02-21 NOTE — ED Notes (Signed)
Seen here yesterday for same and was dx with gallstones.  States pain is getting worse.  C/o n/v.

## 2013-02-21 NOTE — ED Notes (Signed)
Christina, RN, did ask if we could cover elevated glucose. Asked EDP and no new order was given for this time. Pt transported to floor. Sent Ancef IVPB to floor with pt.

## 2013-02-21 NOTE — ED Notes (Signed)
Pt states nausea. Last vomited at ~1100. Pt medicated. NAD at this time.

## 2013-02-22 DIAGNOSIS — R7309 Other abnormal glucose: Secondary | ICD-10-CM

## 2013-02-22 LAB — GLUCOSE, CAPILLARY
Glucose-Capillary: 259 mg/dL — ABNORMAL HIGH (ref 70–99)
Glucose-Capillary: 275 mg/dL — ABNORMAL HIGH (ref 70–99)
Glucose-Capillary: 280 mg/dL — ABNORMAL HIGH (ref 70–99)
Glucose-Capillary: 320 mg/dL — ABNORMAL HIGH (ref 70–99)
Glucose-Capillary: 331 mg/dL — ABNORMAL HIGH (ref 70–99)

## 2013-02-22 LAB — CBC
HCT: 34.5 % — ABNORMAL LOW (ref 39.0–52.0)
MCHC: 35.4 g/dL (ref 30.0–36.0)
Platelets: 165 10*3/uL (ref 150–400)
RDW: 12.9 % (ref 11.5–15.5)
WBC: 12.5 10*3/uL — ABNORMAL HIGH (ref 4.0–10.5)

## 2013-02-22 LAB — HEPATIC FUNCTION PANEL
ALT: 42 U/L (ref 0–53)
AST: 45 U/L — ABNORMAL HIGH (ref 0–37)
Alkaline Phosphatase: 93 U/L (ref 39–117)
Bilirubin, Direct: 1 mg/dL — ABNORMAL HIGH (ref 0.0–0.3)
Total Protein: 6.9 g/dL (ref 6.0–8.3)

## 2013-02-22 LAB — DIFFERENTIAL
Basophils Relative: 0 % (ref 0–1)
Eosinophils Relative: 0 % (ref 0–5)
Lymphocytes Relative: 8 % — ABNORMAL LOW (ref 12–46)
Monocytes Absolute: 0.5 10*3/uL (ref 0.1–1.0)
Monocytes Relative: 4 % (ref 3–12)
Neutrophils Relative %: 88 % — ABNORMAL HIGH (ref 43–77)

## 2013-02-22 LAB — BASIC METABOLIC PANEL
BUN: 18 mg/dL (ref 6–23)
Chloride: 101 mEq/L (ref 96–112)
Creatinine, Ser: 1.41 mg/dL — ABNORMAL HIGH (ref 0.50–1.35)
GFR calc Af Amer: 55 mL/min — ABNORMAL LOW (ref >90)
Glucose, Bld: 280 mg/dL — ABNORMAL HIGH (ref 70–99)

## 2013-02-22 LAB — PROTIME-INR: INR: 1.3 (ref 0.00–1.49)

## 2013-02-22 MED ORDER — HALOPERIDOL LACTATE 5 MG/ML IJ SOLN
2.5000 mg | Freq: Once | INTRAMUSCULAR | Status: AC
  Administered 2013-02-22: 2.5 mg via INTRAMUSCULAR
  Filled 2013-02-22: qty 1

## 2013-02-22 MED ORDER — POTASSIUM CHLORIDE 10 MEQ/100ML IV SOLN
10.0000 meq | INTRAVENOUS | Status: AC
  Administered 2013-02-22 (×3): 10 meq via INTRAVENOUS
  Filled 2013-02-22: qty 300

## 2013-02-22 MED ORDER — LORAZEPAM 1 MG PO TABS
1.0000 mg | ORAL_TABLET | ORAL | Status: AC
  Administered 2013-02-22: 1 mg via ORAL
  Filled 2013-02-22: qty 1

## 2013-02-22 MED ORDER — MORPHINE SULFATE 2 MG/ML IJ SOLN
2.0000 mg | INTRAMUSCULAR | Status: DC | PRN
  Administered 2013-02-22: 2 mg via INTRAVENOUS
  Filled 2013-02-22: qty 1

## 2013-02-22 MED ORDER — INSULIN DETEMIR 100 UNIT/ML ~~LOC~~ SOLN
SUBCUTANEOUS | Status: AC
  Filled 2013-02-22: qty 1

## 2013-02-22 MED ORDER — POTASSIUM CHLORIDE IN NACL 20-0.9 MEQ/L-% IV SOLN
INTRAVENOUS | Status: DC
  Administered 2013-02-22 – 2013-02-23 (×3): via INTRAVENOUS

## 2013-02-22 MED ORDER — HALOPERIDOL LACTATE 5 MG/ML IJ SOLN
2.5000 mg | Freq: Once | INTRAMUSCULAR | Status: AC
  Administered 2013-02-22: 2.5 mg via INTRAVENOUS
  Filled 2013-02-22: qty 1

## 2013-02-22 MED ORDER — CHLORHEXIDINE GLUCONATE 4 % EX LIQD
1.0000 "application " | Freq: Once | CUTANEOUS | Status: AC
  Administered 2013-02-22: 1 via TOPICAL
  Filled 2013-02-22: qty 15

## 2013-02-22 MED ORDER — INSULIN DETEMIR 100 UNIT/ML ~~LOC~~ SOLN
10.0000 [IU] | Freq: Every day | SUBCUTANEOUS | Status: DC
  Administered 2013-02-22 – 2013-02-27 (×6): 10 [IU] via SUBCUTANEOUS
  Filled 2013-02-22 (×8): qty 0.1

## 2013-02-22 NOTE — Progress Notes (Signed)
[facility] PROGRESS NOTE  [name] HQI:[id number] DOB: [birth year] DOA: 02/21/2013 PCP: Leo Grosser, MD  Assessment/Plan: 1. Abdominal pain with cholelithiasis. Plan is for cholecystectomy tomorrow 2. Dehydration. Receiving IV fluids. 3. Hypertension. Continue outpatient regimen. 4. Probable early dementia. Patient has become agitated in the hospital. We'll use Ativan as necessary. 5. Diabetes. Her blood sugars have been elevated. We'll start Levemir. 6. Hyponatremia, to dehydration. Improved with IV fluids  Code Status: full code Family Communication: discussed with family at the bedside Disposition Plan: pending hospital course   Consultants:  Gen surgery, Dr. Lovell Sheehan  Procedures:  none  Antibiotics:  Cefazolin   HPI/Subjective: Having continued abdominal pain  Objective: Filed Vitals:   02/22/13 1400  BP: 156/75  Pulse: 108  Temp: 99.5 F (37.5 C)  Resp: 20    Intake/Output Summary (Last 24 hours) at 02/22/13 1936 Last data filed at 02/22/13 1800  Gross per 24 hour  Intake 1980.41 ml  Output    600 ml  Net 1380.41 ml   Filed Weights   02/21/13 1434 02/21/13 1801  Weight: 97.07 kg (214 lb) 96.163 kg (212 lb)    Exam:   General:  NAD, uncomfortable due to pain  Cardiovascular: S1, S2 tachycardic  Respiratory: CTA B  Abdomen: soft, diffusely tender, bs+  Musculoskeletal: no pedal edema b/l   Data Reviewed: Basic Metabolic Panel:  Recent Labs Lab 02/19/13 2356 02/21/13 1455 02/22/13 0704  NA 137 128* 132*  K 3.8 3.9 3.4*  CL 102 94* 101  CO2 20 20 20   GLUCOSE 256* 449* 280*  BUN 10 13 18   CREATININE 1.17 1.27 1.41*  CALCIUM 9.2 9.0 8.6   Liver Function Tests:  Recent Labs Lab 02/19/13 2356 02/21/13 1455 02/22/13 0704  AST 20 33 45*  ALT 26 41 42  ALKPHOS 83 88 93  BILITOT 0.4 1.6* 1.5*  PROT 7.9 7.9 6.9  ALBUMIN 4.1 3.5 2.9*    Recent Labs Lab 02/19/13 2356 02/21/13 1455  LIPASE 58 19   No  results found for this basename: AMMONIA,  in the last 168 hours CBC:  Recent Labs Lab 02/19/13 2356 02/21/13 1455 02/22/13 0703 02/22/13 0724  WBC 6.4 14.6* 12.5*  --   NEUTROABS  --  13.2*  --  10.5*  HGB 13.2 13.4 12.2*  --   HCT 36.4* 37.2* 34.5*  --   MCV 86.3 87.7 88.2  --   PLT 204 184 165  --    Cardiac Enzymes:  Recent Labs Lab 02/19/13 2356 02/20/13 0421 02/20/13 0814 02/21/13 1455  TROPONINI <0.30 <0.30 <0.30 <0.30   BNP (last 3 results) No results found for this basename: PROBNP,  in the last 8760 hours CBG:  Recent Labs Lab 02/22/13 0014 02/22/13 0404 02/22/13 0735 02/22/13 1129 02/22/13 1630  GLUCAP 257* 249* 269* 320* 259*    No results found for this or any previous visit (from the past 240 hour(s)).   Studies: No results found.  Scheduled Meds: .  ceFAZolin (ANCEF) IV  1 g Intravenous Q8H  . chlorhexidine  1 application Topical Once  . heparin  5,000 Units Subcutaneous Q8H  . insulin aspart  0-15 Units Subcutaneous TID WC  . insulin aspart  0-5 Units Subcutaneous QHS   Continuous Infusions: . 0.9 % NaCl with KCl 20 mEq / L 100 mL/hr at 02/22/13 1217    Active Problems:   Hypertension   Cholelithiasis   DM (diabetes mellitus)    Time spent:  [facility]  [facility] Pager [phone]. If 7PM-7AM, please contact night-coverage at [email or web address], password [facility] 02/22/2013, 7:36 PM  LOS: 1 day

## 2013-02-22 NOTE — Progress Notes (Signed)
Pt's O2 sat 80% upon tech entering room. 3L O2 placed via [place] and respiratory called.  Pt stable at this time at 100%.

## 2013-02-22 NOTE — Progress Notes (Signed)
Patient was seen on 02/20/2013 for cholecystitis secondary to cholelithiasis in the emergency room. He decided to go home and continued having right upper quadrant abdominal pain. In addition, he stopped his oral hypoglycemics and presented back to the emergency room with hyperglycemia. He continues to have mild right upper quadrant abdominal pain. His blood glucoses are better controlled on insulin. He did have a jump in his direct bilirubin. We'll proceed with a laparoscopic cholecystectomy with cholangiograms tomorrow. The risks and benefits of the procedure including bleeding, infection, hepatobiliary injury, and the possibility of an open procedure were fully explained to the patient, who gave informed consent.

## 2013-02-23 ENCOUNTER — Inpatient Hospital Stay (HOSPITAL_COMMUNITY): Payer: Medicare Other

## 2013-02-23 ENCOUNTER — Inpatient Hospital Stay (HOSPITAL_COMMUNITY): Payer: Medicare Other | Admitting: Anesthesiology

## 2013-02-23 ENCOUNTER — Encounter (HOSPITAL_COMMUNITY): Payer: Self-pay | Admitting: Anesthesiology

## 2013-02-23 ENCOUNTER — Encounter (HOSPITAL_COMMUNITY)
Admission: EM | Disposition: A | Discharge status: Home Health | Payer: Self-pay | Source: Home / Self Care | Attending: Family Medicine

## 2013-02-23 ENCOUNTER — Encounter (HOSPITAL_COMMUNITY): Payer: Self-pay | Admitting: *Deleted

## 2013-02-23 DIAGNOSIS — K8021 Calculus of gallbladder without cholecystitis with obstruction: Secondary | ICD-10-CM

## 2013-02-23 HISTORY — PX: CHOLECYSTECTOMY: SHX55

## 2013-02-23 LAB — GLUCOSE, CAPILLARY
Glucose-Capillary: 246 mg/dL — ABNORMAL HIGH (ref 70–99)
Glucose-Capillary: 195 mg/dL — ABNORMAL HIGH (ref 70–99)
Glucose-Capillary: 241 mg/dL — ABNORMAL HIGH (ref 70–99)
Glucose-Capillary: 211 mg/dL — ABNORMAL HIGH (ref 70–99)

## 2013-02-23 LAB — HEPATIC FUNCTION PANEL
Alkaline Phosphatase: 183 U/L — ABNORMAL HIGH (ref 39–117)
Bilirubin, Direct: 1.2 mg/dL — ABNORMAL HIGH (ref 0.0–0.3)
Indirect Bilirubin: 0.7 mg/dL (ref 0.3–0.9)
Total Bilirubin: 1.9 mg/dL — ABNORMAL HIGH (ref 0.3–1.2)

## 2013-02-23 LAB — CBC WITH DIFFERENTIAL/PLATELET
Basophils Absolute: 0 10*3/uL (ref 0.0–0.1)
Basophils Relative: 0 % (ref 0–1)
Lymphocytes Relative: 8 % — ABNORMAL LOW (ref 12–46)
MCHC: 35.2 g/dL (ref 30.0–36.0)
Monocytes Absolute: 0.9 10*3/uL (ref 0.1–1.0)
Neutro Abs: 11.4 10*3/uL — ABNORMAL HIGH (ref 1.7–7.7)
Platelets: 200 10*3/uL (ref 150–400)
RDW: 12.9 % (ref 11.5–15.5)
WBC: 13.6 10*3/uL — ABNORMAL HIGH (ref 4.0–10.5)

## 2013-02-23 LAB — SURGICAL PCR SCREEN
MRSA, PCR: NEGATIVE
Staphylococcus aureus: NEGATIVE

## 2013-02-23 LAB — BASIC METABOLIC PANEL
BUN: 15 mg/dL (ref 6–23)
Calcium: 8.9 mg/dL (ref 8.4–10.5)
Chloride: 104 mEq/L (ref 96–112)
Creatinine, Ser: 1.52 mg/dL — ABNORMAL HIGH (ref 0.50–1.35)
GFR calc Af Amer: 50 mL/min — ABNORMAL LOW (ref >90)
GFR calc non Af Amer: 43 mL/min — ABNORMAL LOW (ref >90)

## 2013-02-23 SURGERY — LAPAROSCOPIC CHOLECYSTECTOMY
Anesthesia: General | Site: Abdomen | Wound class: Dirty or Infected

## 2013-02-23 MED ORDER — NEOSTIGMINE METHYLSULFATE 1 MG/ML IJ SOLN
INTRAMUSCULAR | Status: DC | PRN
  Administered 2013-02-23: 2 mg via INTRAVENOUS

## 2013-02-23 MED ORDER — 0.9 % SODIUM CHLORIDE (POUR BTL) OPTIME
TOPICAL | Status: DC | PRN
  Administered 2013-02-23: 1000 mL

## 2013-02-23 MED ORDER — SUCCINYLCHOLINE CHLORIDE 20 MG/ML IJ SOLN
INTRAMUSCULAR | Status: DC | PRN
  Administered 2013-02-23: 120 mg via INTRAVENOUS

## 2013-02-23 MED ORDER — PHENYLEPHRINE HCL 10 MG/ML IJ SOLN
INTRAMUSCULAR | Status: DC | PRN
  Administered 2013-02-23 (×2): 100 ug via INTRAVENOUS

## 2013-02-23 MED ORDER — ALBUTEROL SULFATE (5 MG/ML) 0.5% IN NEBU
2.5000 mg | INHALATION_SOLUTION | Freq: Once | RESPIRATORY_TRACT | Status: AC
  Administered 2013-02-23: 2.5 mg via RESPIRATORY_TRACT

## 2013-02-23 MED ORDER — ONDANSETRON HCL 4 MG/2ML IJ SOLN
4.0000 mg | Freq: Once | INTRAMUSCULAR | Status: AC
  Administered 2013-02-23: 4 mg via INTRAVENOUS

## 2013-02-23 MED ORDER — CEFAZOLIN SODIUM-DEXTROSE 2-3 GM-% IV SOLR
INTRAVENOUS | Status: AC
  Filled 2013-02-23: qty 50

## 2013-02-23 MED ORDER — ALBUTEROL SULFATE (5 MG/ML) 0.5% IN NEBU
INHALATION_SOLUTION | RESPIRATORY_TRACT | Status: AC
  Filled 2013-02-23: qty 0.5

## 2013-02-23 MED ORDER — HEMOSTATIC AGENTS (NO CHARGE) OPTIME
TOPICAL | Status: DC | PRN
  Administered 2013-02-23: 2 via TOPICAL

## 2013-02-23 MED ORDER — PHENYLEPHRINE HCL 10 MG/ML IJ SOLN
INTRAMUSCULAR | Status: AC
  Filled 2013-02-23: qty 1

## 2013-02-23 MED ORDER — MIDAZOLAM HCL 2 MG/2ML IJ SOLN: 1.0000 mg | INTRAMUSCULAR | Status: DC | PRN

## 2013-02-23 MED ORDER — ROCURONIUM BROMIDE 50 MG/5ML IV SOLN
INTRAVENOUS | Status: AC
  Filled 2013-02-23: qty 1

## 2013-02-23 MED ORDER — BUPIVACAINE HCL 0.5 % IJ SOLN
INTRAMUSCULAR | Status: DC | PRN
  Administered 2013-02-23: 10 mL

## 2013-02-23 MED ORDER — ACETAMINOPHEN 325 MG PO TABS: 650.0000 mg | ORAL_TABLET | Freq: Four times a day (QID) | ORAL | Status: DC | PRN

## 2013-02-23 MED ORDER — BUPIVACAINE HCL (PF) 0.5 % IJ SOLN
INTRAMUSCULAR | Status: AC
  Filled 2013-02-23: qty 30

## 2013-02-23 MED ORDER — ROCURONIUM BROMIDE 100 MG/10ML IV SOLN
INTRAVENOUS | Status: DC | PRN
  Administered 2013-02-23: 20 mg via INTRAVENOUS
  Administered 2013-02-23 (×2): 10 mg via INTRAVENOUS

## 2013-02-23 MED ORDER — ONDANSETRON HCL 4 MG/2ML IJ SOLN
INTRAMUSCULAR | Status: AC
  Filled 2013-02-23: qty 2

## 2013-02-23 MED ORDER — ENOXAPARIN SODIUM 40 MG/0.4ML ~~LOC~~ SOLN
40.0000 mg | SUBCUTANEOUS | Status: DC
  Administered 2013-02-23 – 2013-02-28 (×6): 40 mg via SUBCUTANEOUS
  Filled 2013-02-23 (×6): qty 0.4

## 2013-02-23 MED ORDER — ONDANSETRON HCL 4 MG PO TABS: 4.0000 mg | ORAL_TABLET | Freq: Four times a day (QID) | ORAL | Status: DC | PRN

## 2013-02-23 MED ORDER — ACETAMINOPHEN 650 MG RE SUPP
650.0000 mg | RECTAL | Status: DC | PRN
  Administered 2013-02-23: 650 mg via RECTAL
  Filled 2013-02-23: qty 1

## 2013-02-23 MED ORDER — LACTATED RINGERS IV SOLN
INTRAVENOUS | Status: DC
  Administered 2013-02-23: 12:00:00 via INTRAVENOUS

## 2013-02-23 MED ORDER — ONDANSETRON HCL 4 MG/2ML IJ SOLN: 4.0000 mg | Freq: Once | INTRAMUSCULAR | Status: DC | PRN

## 2013-02-23 MED ORDER — FENTANYL CITRATE 0.05 MG/ML IJ SOLN
INTRAMUSCULAR | Status: AC
  Filled 2013-02-23: qty 2

## 2013-02-23 MED ORDER — KETOROLAC TROMETHAMINE 30 MG/ML IJ SOLN
30.0000 mg | Freq: Once | INTRAMUSCULAR | Status: AC
  Administered 2013-02-23: 30 mg via INTRAVENOUS

## 2013-02-23 MED ORDER — SODIUM CHLORIDE 0.9 % IR SOLN
Status: DC | PRN
  Administered 2013-02-23: 3000 mL

## 2013-02-23 MED ORDER — FENTANYL CITRATE 0.05 MG/ML IJ SOLN: 25.0000 ug | INTRAMUSCULAR | Status: DC | PRN

## 2013-02-23 MED ORDER — PIPERACILLIN-TAZOBACTAM 3.375 G IVPB
3.3750 g | Freq: Three times a day (TID) | INTRAVENOUS | Status: DC
  Administered 2013-02-23 – 2013-02-25 (×7): 3.375 g via INTRAVENOUS
  Filled 2013-02-23 (×9): qty 50

## 2013-02-23 MED ORDER — GLYCOPYRROLATE 0.2 MG/ML IJ SOLN
INTRAMUSCULAR | Status: DC | PRN
  Administered 2013-02-23: 0.4 mg via INTRAVENOUS

## 2013-02-23 MED ORDER — FENTANYL CITRATE 0.05 MG/ML IJ SOLN
INTRAMUSCULAR | Status: DC | PRN
  Administered 2013-02-23: 100 ug via INTRAVENOUS
  Administered 2013-02-23: 50 ug via INTRAVENOUS
  Administered 2013-02-23: 100 ug via INTRAVENOUS
  Administered 2013-02-23 (×2): 50 ug via INTRAVENOUS

## 2013-02-23 MED ORDER — LORAZEPAM 2 MG/ML IJ SOLN
0.5000 mg | Freq: Once | INTRAMUSCULAR | Status: AC
  Administered 2013-02-23: 0.5 mg via INTRAVENOUS
  Filled 2013-02-23: qty 1

## 2013-02-23 MED ORDER — SODIUM CHLORIDE 0.9 % IN NEBU
INHALATION_SOLUTION | RESPIRATORY_TRACT | Status: AC
  Filled 2013-02-23: qty 3

## 2013-02-23 MED ORDER — FENTANYL CITRATE 0.05 MG/ML IJ SOLN
INTRAMUSCULAR | Status: AC
  Filled 2013-02-23: qty 5

## 2013-02-23 MED ORDER — ETOMIDATE 2 MG/ML IV SOLN
INTRAVENOUS | Status: AC
  Filled 2013-02-23: qty 10

## 2013-02-23 MED ORDER — ONDANSETRON HCL 4 MG/2ML IJ SOLN: 4.0000 mg | Freq: Four times a day (QID) | INTRAMUSCULAR | Status: DC | PRN

## 2013-02-23 MED ORDER — LACTATED RINGERS IV SOLN
INTRAVENOUS | Status: DC | PRN
  Administered 2013-02-23 (×2): via INTRAVENOUS

## 2013-02-23 MED ORDER — HYDROCODONE-ACETAMINOPHEN 5-325 MG PO TABS
1.0000 | ORAL_TABLET | ORAL | Status: DC | PRN
  Administered 2013-02-25 – 2013-03-01 (×3): 1 via ORAL
  Filled 2013-02-23 (×3): qty 1

## 2013-02-23 MED ORDER — LIDOCAINE HCL (PF) 1 % IJ SOLN
INTRAMUSCULAR | Status: AC
  Filled 2013-02-23: qty 5

## 2013-02-23 MED ORDER — ETOMIDATE 2 MG/ML IV SOLN
INTRAVENOUS | Status: DC | PRN
  Administered 2013-02-23: 12 mg via INTRAVENOUS

## 2013-02-23 MED ORDER — CEFAZOLIN SODIUM-DEXTROSE 2-3 GM-% IV SOLR
2.0000 g | Freq: Once | INTRAVENOUS | Status: AC
  Administered 2013-02-23 (×2): 2 g via INTRAVENOUS

## 2013-02-23 MED ORDER — KETOROLAC TROMETHAMINE 30 MG/ML IJ SOLN
INTRAMUSCULAR | Status: AC
  Filled 2013-02-23: qty 1

## 2013-02-23 MED ORDER — BIOTENE DRY MOUTH MT LIQD
15.0000 mL | Freq: Two times a day (BID) | OROMUCOSAL | Status: DC
  Administered 2013-02-23 – 2013-03-01 (×12): 15 mL via OROMUCOSAL

## 2013-02-23 MED ORDER — HYDROMORPHONE HCL PF 1 MG/ML IJ SOLN
1.0000 mg | INTRAMUSCULAR | Status: DC | PRN
  Administered 2013-02-24 – 2013-02-25 (×4): 1 mg via INTRAVENOUS
  Filled 2013-02-23 (×6): qty 1

## 2013-02-23 MED ORDER — GLYCOPYRROLATE 0.2 MG/ML IJ SOLN
INTRAMUSCULAR | Status: AC
  Filled 2013-02-23: qty 3

## 2013-02-23 SURGICAL SUPPLY — 49 items
APPLIER CLIP LAPSCP 10X32 DD (CLIP) ×3 IMPLANT
BAG HAMPER (MISCELLANEOUS) ×3 IMPLANT
BAG SPEC RTRVL LRG 6X4 10 (ENDOMECHANICALS) ×2
CATH CHOLANGIOGRAM 4.5FR (CATHETERS) ×3 IMPLANT
CLOTH BEACON ORANGE TIMEOUT ST (SAFETY) ×3 IMPLANT
COVER LIGHT HANDLE STERIS (MISCELLANEOUS) ×6 IMPLANT
COVER MAYO STAND XLG (DRAPE) ×3 IMPLANT
CUTTER LINEAR ENDO 35 ETS TH (STAPLE) ×2 IMPLANT
DECANTER SPIKE VIAL GLASS SM (MISCELLANEOUS) ×3 IMPLANT
DRAPE C-ARM FOLDED MOBILE STRL (DRAPES) ×3 IMPLANT
DURAPREP 26ML APPLICATOR (WOUND CARE) ×3 IMPLANT
ELECT REM PT RETURN 9FT ADLT (ELECTROSURGICAL) ×3
ELECTRODE REM PT RTRN 9FT ADLT (ELECTROSURGICAL) ×2 IMPLANT
EVACUATOR DRAINAGE 10X20 100CC (DRAIN) ×1 IMPLANT
EVACUATOR SILICONE 100CC (DRAIN) ×3
FILTER SMOKE EVAC LAPAROSHD (FILTER) ×3 IMPLANT
FORMALIN 10 PREFIL 120ML (MISCELLANEOUS) ×3 IMPLANT
GLOVE BIO SURGEON STRL SZ7.5 (GLOVE) ×3 IMPLANT
GLOVE BIOGEL PI IND STRL 7.0 (GLOVE) ×3 IMPLANT
GLOVE BIOGEL PI INDICATOR 7.0 (GLOVE) ×3
GLOVE ECLIPSE 6.5 STRL STRAW (GLOVE) ×2 IMPLANT
GLOVE EXAM NITRILE LRG STRL (GLOVE) ×2 IMPLANT
GLOVE SS BIOGEL STRL SZ 6.5 (GLOVE) ×1 IMPLANT
GLOVE SUPERSENSE BIOGEL SZ 6.5 (GLOVE) ×1
GOWN STRL REIN XL XLG (GOWN DISPOSABLE) ×11 IMPLANT
HEMOSTAT SNOW SURGICEL 2X4 (HEMOSTASIS) ×5 IMPLANT
INST SET LAPROSCOPIC AP (KITS) ×3 IMPLANT
IV NS IRRIG 3000ML ARTHROMATIC (IV SOLUTION) ×2 IMPLANT
KIT ROOM TURNOVER APOR (KITS) ×3 IMPLANT
KIT TROCAR LAP CHOLE (TROCAR) ×3 IMPLANT
LIGASURE LAP ATLAS 10MM 37CM (INSTRUMENTS) ×2 IMPLANT
MANIFOLD NEPTUNE II (INSTRUMENTS) ×3 IMPLANT
NS IRRIG 1000ML POUR BTL (IV SOLUTION) ×5 IMPLANT
PACK LAP CHOLE LZT030E (CUSTOM PROCEDURE TRAY) ×5 IMPLANT
PAD ARMBOARD 7.5X6 YLW CONV (MISCELLANEOUS) ×3 IMPLANT
POUCH SPECIMEN RETRIEVAL 10MM (ENDOMECHANICALS) ×3 IMPLANT
SET BASIN LINEN APH (SET/KITS/TRAYS/PACK) ×3 IMPLANT
SET TUBE IRRIG SUCTION NO TIP (IRRIGATION / IRRIGATOR) ×2 IMPLANT
SPONGE DRAIN TRACH 4X4 STRL 2S (GAUZE/BANDAGES/DRESSINGS) ×2 IMPLANT
SPONGE GAUZE 2X2 8PLY STRL LF (GAUZE/BANDAGES/DRESSINGS) ×12 IMPLANT
STAPLER VISISTAT (STAPLE) ×3 IMPLANT
SUT ETHILON 3 0 FSL (SUTURE) ×2 IMPLANT
SUT VICRYL 0 UR6 27IN ABS (SUTURE) ×3 IMPLANT
SYR 20CC LL (SYRINGE) ×3 IMPLANT
SYR 30ML LL (SYRINGE) ×3 IMPLANT
TAPE CLOTH SURG 4X10 WHT LF (GAUZE/BANDAGES/DRESSINGS) ×2 IMPLANT
TUBING INSUFFLATION (TUBING) ×3 IMPLANT
WARMER LAPAROSCOPE (MISCELLANEOUS) ×3 IMPLANT
YANKAUER SUCT 12FT TUBE ARGYLE (SUCTIONS) ×3 IMPLANT

## 2013-02-23 NOTE — OR Nursing (Signed)
Dr. Lovell Sheehan in to talk with family , wife, children, and patient brother. All agreed to proceed with the gallbladder surgery.

## 2013-02-23 NOTE — Progress Notes (Signed)
Inpatient Diabetes Program Recommendations  AACE/ADA: New Consensus Statement on Inpatient Glycemic Control (2013)  Target Ranges:  Prepandial:   less than 140 mg/dL      Peak postprandial:   less than 180 mg/dL (1-2 hours)      Critically ill patients:  140 - 180 mg/dL   Results for ELIODORO, GULLETT (MRN [id number]) as of 02/23/2013 11:11  Ref. Range 02/22/2013 04:04 02/22/2013 07:35 02/22/2013 11:29 02/22/2013 16:30 02/22/2013 20:43 02/22/2013 22:22 02/22/2013 23:42 02/23/2013 04:23 02/23/2013 07:20 02/23/2013 10:51  Glucose-Capillary Latest Range: 70-99 mg/dL 409 (H) 811 (H) 914 (H) 259 (H) 331 (H) 280 (H) 275 (H) 246 (H) 195 (H) 242 (H)    Inpatient Diabetes Program Recommendations Insulin - Basal: Please increase Levemir to 12 units QHS.  Note: Patient has a history of diabetes and according to the home medication list, patient takes Metformin 1000 mg BID and Januvia 100 mg daily at home for diabetes management.  Currently, patient is ordered to receive Levemir 10 units QHS, Novolog 0-15 units AC, and Novolog 0-5 units HS for inpatient glycemic control.  In reviewing patient's charted, noted outpatient visit with Dr. Tanya Nones on 02/10/2013 which listed Metformin 1000 mg BID, Januvia 100 mg daily, and Glipizide 10 mg daily as outpatient medications for diabetes treatment.  The patient had requested to decrease or discontinue the Metformin due to GI upset (diarrhea).  Therefore, Dr. Tanya Nones noted that patient was instructed to decrease Metformin to 500 mg BID and continue Januvia and Glipizide as prescribed.  Glipizide is not listed on the home medication list and the patient is currently in surgery so unable to verify home medications at this time.    Blood glucose over the past 24 hours has ranged from 195-331 mg/dl and fasting blood glucose this morning was 246 mg/dl.  Please consider increasing Levemir to 12 units QHS.  Will plan to discuss home medications with patient when appropriate to ensure home  medication list in the chart is correct.  Will follow as an inpatient.    Thanks, Orlando Penner, RN, MSN, CCRN Diabetes Coordinator Inpatient Diabetes Program [phone]

## 2013-02-23 NOTE — Anesthesia Preprocedure Evaluation (Signed)
Anesthesia Evaluation  Patient identified by MRN, date of birth, ID band Patient confused    Reviewed: Allergy & Precautions, H&P , NPO status , Patient's Chart, lab work & pertinent test results  Airway Mallampati: III TM Distance: >3 FB     Dental  (+) Teeth Intact and Poor Dentition   Pulmonary COPD breath sounds clear to auscultation        Cardiovascular hypertension, Pt. on medications Rhythm:Regular Rate:Tachycardia     Neuro/Psych PSYCHIATRIC DISORDERS Anxiety Pt is confused, Ox1. Wife reports he's had worsening confusion since just before admission. Previous hx TIA's. MRI head 2012. TIA   GI/Hepatic   Endo/Other  diabetes, Type 2, Oral Hypoglycemic Agents  Renal/GU      Musculoskeletal   Abdominal   Peds  Hematology   Anesthesia Other Findings   Reproductive/Obstetrics                           Anesthesia Physical Anesthesia Plan  ASA: IV  Anesthesia Plan: General   Post-op Pain Management:    Induction: Intravenous  Airway Management Planned: Oral ETT  Additional Equipment:   Intra-op Plan:   Post-operative Plan: Extubation in OR and Possible Post-op intubation/ventilation  Informed Consent: I have reviewed the patients History and Physical, chart, labs and discussed the procedure including the risks, benefits and alternatives for the proposed anesthesia with the patient or authorized representative who has indicated his/her understanding and acceptance.     Plan Discussed with: CRNA and Surgeon  Anesthesia Plan Comments: (Possible post op vent support due to decreased mental status was discussed with wife/family and they agreed with plan.)        Anesthesia Quick Evaluation

## 2013-02-23 NOTE — OR Nursing (Signed)
Pt arrived to preop via patient bed. Son at bedside. Pt lethargic but arousable. Squirming on bed at times. Denies any pain.Able to tell me his name and birthdate, unable to tell me what he is having done. Son states that he's been confused like this every since he came to the emergency room for the gallbladder. States he was treated then sent him home. Then returned to hospital due to his health.

## 2013-02-23 NOTE — Preoperative (Signed)
Beta Blockers   Reason not to administer Beta Blockers:Not Applicable 

## 2013-02-23 NOTE — Progress Notes (Signed)
[facility] PROGRESS NOTE  JAYDEN KRATOCHVIL ZOX:[id number] DOB: [birth year] DOA: 02/21/2013 PCP: Leo Grosser, MD  Assessment/Plan: 1. Abdominal pain with cholelithiasis. Patient underwent cholecystectomy today. Discussed with Dr. Lovell Sheehan in patients that have a gangrenous gallbladder. Due to some confusion, postoperatively he was sent to the step down unit for monitoring. 2. Dehydration. Receiving IV fluids. 3. Hypertension. Continue outpatient regimen. 4. Probable early dementia. Patient has become agitated in the hospital. We'll use Ativan as necessary. This confusion in the hospital is likely exacerbated by his acute illness. He does not have any focal deficits. We will continue to monitor 5. Diabetes. Her blood sugars have been elevated. Started on Levemir 6. Hyponatremia, due to dehydration. Improved with IV fluids 7. Acute renal failure. Etiology is not clear. If creatinine continues to rise tomorrow, may need to get nephrology input.  Code Status: full code Family Communication: discussed with family at the bedside Disposition Plan: pending hospital course   Consultants:  Gen surgery, Dr. Lovell Sheehan  Procedures:  Laparoscopic cholecystectomy on 8/18  Antibiotics:  Cefazolin   HPI/Subjective: Patient is seen postoperatively in his room. He is still drowsy from medication. Denies any complaints, quickly falls back asleep  Objective: Filed Vitals:   02/23/13 1600  BP: 136/54  Pulse: 103  Temp: 98.2 F (36.8 C)  Resp: 19    Intake/Output Summary (Last 24 hours) at 02/23/13 1957 Last data filed at 02/23/13 1700  Gross per 24 hour  Intake 3176.67 ml  Output    720 ml  Net 2456.67 ml   Filed Weights   02/21/13 1434 02/21/13 1801 02/23/13 1100  Weight: 97.07 kg (214 lb) 96.163 kg (212 lb) 96.163 kg (212 lb)    Exam:   General:  NAD,   Cardiovascular: S1, S2 regular rate and rhythm  Respiratory: CTA B  Abdomen: soft, diffusely tender,  bs+  Musculoskeletal: no pedal edema b/l   Data Reviewed: Basic Metabolic Panel:  Recent Labs Lab 02/19/13 2356 02/21/13 1455 02/22/13 0704 02/23/13 0532  NA 137 128* 132* 140  K 3.8 3.9 3.4* 4.1  CL 102 94* 101 104  CO2 20 20 20 24   GLUCOSE 256* 449* 280* 236*  BUN 10 13 18 15   CREATININE 1.17 1.27 1.41* 1.52*  CALCIUM 9.2 9.0 8.6 8.9   Liver Function Tests:  Recent Labs Lab 02/19/13 2356 02/21/13 1455 02/22/13 0704 02/23/13 0532  AST 20 33 45* 52*  ALT 26 41 42 40  ALKPHOS 83 88 93 183*  BILITOT 0.4 1.6* 1.5* 1.9*  PROT 7.9 7.9 6.9 7.2  ALBUMIN 4.1 3.5 2.9* 2.9*    Recent Labs Lab 02/19/13 2356 02/21/13 1455  LIPASE 58 19   No results found for this basename: AMMONIA,  in the last 168 hours CBC:  Recent Labs Lab 02/19/13 2356 02/21/13 1455 02/22/13 0703 02/22/13 0724 02/23/13 0532  WBC 6.4 14.6* 12.5*  --  13.6*  NEUTROABS  --  13.2*  --  10.5* 11.4*  HGB 13.2 13.4 12.2*  --  11.9*  HCT 36.4* 37.2* 34.5*  --  33.8*  MCV 86.3 87.7 88.2  --  88.7  PLT 204 184 165  --  200   Cardiac Enzymes:  Recent Labs Lab 02/19/13 2356 02/20/13 0421 02/20/13 0814 02/21/13 1455  TROPONINI <0.30 <0.30 <0.30 <0.30   BNP (last 3 results) No results found for this basename: PROBNP,  in the last 8760 hours CBG:  Recent Labs Lab 02/23/13 0423 02/23/13 0720 02/23/13 1051 02/23/13 1515 02/23/13  1707  GLUCAP 246* 195* 242* 224* 241*    Recent Results (from the past 240 hour(s))  SURGICAL PCR SCREEN     Status: None   Collection Time    02/22/13 11:14 PM      Result Value Range Status   MRSA, PCR NEGATIVE  NEGATIVE Final   Staphylococcus aureus NEGATIVE  NEGATIVE Final   Comment:            The Xpert SA Assay (FDA     approved for NASAL specimens     in patients over 11 years of age),     is one component of     a comprehensive surveillance     program.  Test performance has     been validated by [employer] for patients greater     than  or equal to 64 year old.     It is not intended     to diagnose infection nor to     guide or monitor treatment.     Studies: Dg Chest Port 1 View  02/23/2013   *RADIOLOGY REPORT*  Clinical Data: Hypoxia  PORTABLE CHEST - 1 VIEW  Comparison: February 20, 2013.  Findings: Hypoinflation of the lungs is noted.  No acute pulmonary disease is noted.  Cardiomediastinal silhouette appears normal.  No pleural effusions or pneumothorax noted.  IMPRESSION: No acute cardiopulmonary abnormality seen. Hypoinflation of the lungs is noted most likely due to poor respiratory effort.   Original Report Authenticated By: Lupita Raider.,  M.D.    Scheduled Meds: . enoxaparin (LOVENOX) injection  40 mg Subcutaneous Q24H  . insulin aspart  0-15 Units Subcutaneous TID WC  . insulin aspart  0-5 Units Subcutaneous QHS  . insulin detemir  10 Units Subcutaneous QHS  . piperacillin-tazobactam (ZOSYN)  IV  3.375 g Intravenous Q8H   Continuous Infusions:    Active Problems:   Hypertension   Cholelithiasis   DM (diabetes mellitus)    Time spent:    MEMON,JEHANZEB  [facility] Pager [phone]. If 7PM-7AM, please contact night-coverage at [email or web address], password [facility] 02/23/2013, 7:57 PM  LOS: 2 days

## 2013-02-23 NOTE — Anesthesia Procedure Notes (Signed)
Procedure Name: Intubation Date/Time: 02/23/2013 1:39 PM Performed by: Carolyne Littles, Kellie Murrill L Pre-anesthesia Checklist: Patient identified, Patient being monitored, Timeout performed, Emergency Drugs available and Suction available Patient Re-evaluated:Patient Re-evaluated prior to inductionOxygen Delivery Method: Circle System Utilized Preoxygenation: Pre-oxygenation with 100% oxygen Intubation Type: IV induction, Rapid sequence and Cricoid Pressure applied Ventilation: Mask ventilation without difficulty Laryngoscope Size: 3 and Miller Grade View: Grade I Tube type: Oral Tube size: 8.0 mm Number of attempts: 1 Airway Equipment and Method: stylet Placement Confirmation: ETT inserted through vocal cords under direct vision,  positive ETCO2 and breath sounds checked- equal and bilateral Secured at: 21 cm Tube secured with: Tape Dental Injury: Teeth and Oropharynx as per pre-operative assessment

## 2013-02-23 NOTE — Op Note (Signed)
Patient:  [name]  DOB:  Dec 30, 1935  MRN:  [id number]   Preop Diagnosis:  Cholecystitis, cholelithiasis  Postop Diagnosis:  Same, gangrene of gallbladder  Procedure:  Laparoscopic cholecystectomy  Surgeon:  Franky Macho, M.D.  Anes:  General endotracheal  Indications:  Patient is a 77 year old black male who presents with acute cholecystitis secondary to cholelithiasis. The risks and benefits of the procedure including bleeding, infection, hepatobiliary injury, and the possibility of an open procedure were fully explained to the patient, who gave informed consent.  Procedure note:  The patient was placed in the supine position. After induction of general endotracheal anesthesia, the abdomen was prepped and draped in the usual fashion using DuraPrep. Surgical site confirmation was performed.  A supraumbilical incision was made down to the fascia. A Veress needle was introduced into the abdominal cavity and confirmation of placement was done using the saline drop test. The abdomen was then insufflated to 16 mm mercury pressure. An 11 mm trocar was introduced into the abdominal cavity under direct visualization without difficulty. The patient is placed in reverse Trendelenburg position and additional 11 mm trocar was placed the epigastric region and 5 mm trochars were placed were upper quadrant right flank regions. The liver was inspected and noted to be somewhat edematous. There was a significant amount of the omentum that was covering the liver and attached to the underside of the right hemidiaphragm. This was freed away both bluntly and using the laparoscopic LigaSure. The gallbladder was found and was noted to be inflamed, infected, and with gangrenous changes along various parts of the gallbladder wall. The triangle of Calot was exposed while the gallbladder was retracted in a dynamic fashion. The cystic duct was first identified. Its junction to the infundibulum was fully identified. A  vascular Endo GIA was placed across the cystic duct and fired. The cystic artery was ligated and divided using clips. The gallbladder was then freed away from the gallbladder fossa using Bovie electrocautery. The gallbladder was then delivered through the epigastric trocar site using an Endo Catch bag. The gallbladder fossa was inspected and any bleeding was controlled using Bovie electrocautery. Surgicel is placed the gallbladder fossa. A #10 flat Jackson-Pratt drain was placed in the subhepatic space and brought out through a lateral 5 mm trocar site. It was secured at the skin level using 3-0 nylon interrupted suture. All fluid and air were then evacuated from the abdominal cavity prior to removal of the trochars.  All wounds were irrigated normal saline. All wounds were checked with 0.5% Sensorcaine. The suprabuccal fashion as well as epigastric fascia reapproximated using 0 Vicryl interrupted sutures. All skin incisions were closed using staples. Betadine ointment and dry sterile dressings were applied.  All tape and needle counts were correct at the end of the procedure. Patient was extubated in the operating room and transferred to PACU in stable condition.  Complications:  None  EBL:  25 cc  Specimen:  Gallbladder  Drains: JP drain to subhepatic space

## 2013-02-23 NOTE — Plan of Care (Signed)
Problem: Diagnosis - Type of Surgery Goal: General Surgical Patient Education (See Patient Education module for education specifics)  Discussed info about surgery with family

## 2013-02-23 NOTE — Transfer of Care (Signed)
Immediate Anesthesia Transfer of Care Note  Patient: [name]  Procedure(s) Performed: Procedure(s): LAPAROSCOPIC CHOLECYSTECTOMY (N/A)  Patient Location: PACU  Anesthesia Type:General  Level of Consciousness: sedated  Airway & Oxygen Therapy: Patient Spontanous Breathing and Patient connected to face mask oxygen  Post-op Assessment: Report given to PACU RN and Post -op Vital signs reviewed and stable  Post vital signs: Reviewed and stable  Complications: No apparent anesthesia complications

## 2013-02-23 NOTE — Anesthesia Postprocedure Evaluation (Signed)
  Anesthesia Post-op Note  Patient: [name]  Procedure(s) Performed: Procedure(s): LAPAROSCOPIC CHOLECYSTECTOMY (N/A)  Patient Location: PACU  Anesthesia Type:General  Level of Consciousness: sedated and patient cooperative  Airway and Oxygen Therapy: Patient Spontanous Breathing and Patient connected to face mask oxygen  Post-op Pain: none  Post-op Assessment: Post-op Vital signs reviewed, Patient's Cardiovascular Status Stable, Respiratory Function Stable, Patent Airway, No signs of Nausea or vomiting and Pain level controlled  Post-op Vital Signs: Reviewed and stable  Complications: No apparent anesthesia complications

## 2013-02-24 ENCOUNTER — Encounter (HOSPITAL_COMMUNITY): Payer: Self-pay | Admitting: Anesthesiology

## 2013-02-24 ENCOUNTER — Inpatient Hospital Stay (HOSPITAL_COMMUNITY): Payer: Medicare Other

## 2013-02-24 ENCOUNTER — Inpatient Hospital Stay (HOSPITAL_COMMUNITY): Payer: Medicare Other | Admitting: Anesthesiology

## 2013-02-24 ENCOUNTER — Encounter (HOSPITAL_COMMUNITY)
Admission: EM | Disposition: A | Discharge status: Home Health | Payer: Self-pay | Source: Home / Self Care | Attending: Family Medicine

## 2013-02-24 ENCOUNTER — Encounter (HOSPITAL_COMMUNITY): Payer: Self-pay | Admitting: Gastroenterology

## 2013-02-24 DIAGNOSIS — K8050 Calculus of bile duct without cholangitis or cholecystitis without obstruction: Secondary | ICD-10-CM

## 2013-02-24 DIAGNOSIS — N179 Acute kidney failure, unspecified: Secondary | ICD-10-CM

## 2013-02-24 DIAGNOSIS — R7989 Other specified abnormal findings of blood chemistry: Secondary | ICD-10-CM

## 2013-02-24 DIAGNOSIS — Z9889 Other specified postprocedural states: Secondary | ICD-10-CM

## 2013-02-24 DIAGNOSIS — K810 Acute cholecystitis: Principal | ICD-10-CM

## 2013-02-24 HISTORY — DX: Calculus of bile duct without cholangitis or cholecystitis without obstruction: K80.50

## 2013-02-24 HISTORY — PX: ERCP: SHX5425

## 2013-02-24 LAB — CBC
HCT: 33.7 % — ABNORMAL LOW (ref 39.0–52.0)
Hemoglobin: 11.6 g/dL — ABNORMAL LOW (ref 13.0–17.0)
MCH: 30.4 pg (ref 26.0–34.0)
MCHC: 34.4 g/dL (ref 30.0–36.0)
MCV: 88.2 fL (ref 78.0–100.0)
Platelets: 176 10*3/uL (ref 150–400)
RBC: 3.82 MIL/uL — ABNORMAL LOW (ref 4.22–5.81)
RDW: 13.1 % (ref 11.5–15.5)
WBC: 10.2 10*3/uL (ref 4.0–10.5)

## 2013-02-24 LAB — HEPATIC FUNCTION PANEL
ALT: 93 U/L — ABNORMAL HIGH (ref 0–53)
AST: 191 U/L — ABNORMAL HIGH (ref 0–37)
Albumin: 2.4 g/dL — ABNORMAL LOW (ref 3.5–5.2)
Alkaline Phosphatase: 113 U/L (ref 39–117)
Bilirubin, Direct: 3.8 mg/dL — ABNORMAL HIGH (ref 0.0–0.3)
Indirect Bilirubin: 0.7 mg/dL (ref 0.3–0.9)
Total Bilirubin: 4.5 mg/dL — ABNORMAL HIGH (ref 0.3–1.2)
Total Protein: 7 g/dL (ref 6.0–8.3)

## 2013-02-24 LAB — GLUCOSE, CAPILLARY
Glucose-Capillary: 279 mg/dL — ABNORMAL HIGH (ref 70–99)
Glucose-Capillary: 271 mg/dL — ABNORMAL HIGH (ref 70–99)

## 2013-02-24 LAB — PHOSPHORUS: Phosphorus: 2.4 mg/dL (ref 2.3–4.6)

## 2013-02-24 LAB — BASIC METABOLIC PANEL
Chloride: 103 mEq/L (ref 96–112)
GFR calc Af Amer: 47 mL/min — ABNORMAL LOW (ref >90)
GFR calc non Af Amer: 40 mL/min — ABNORMAL LOW (ref >90)
Glucose, Bld: 256 mg/dL — ABNORMAL HIGH (ref 70–99)
Potassium: 3.4 mEq/L — ABNORMAL LOW (ref 3.5–5.1)
Sodium: 139 mEq/L (ref 135–145)

## 2013-02-24 SURGERY — ERCP, WITH INTERVENTION IF INDICATED
Anesthesia: General

## 2013-02-24 MED ORDER — STERILE WATER FOR IRRIGATION IR SOLN
Status: DC | PRN
  Administered 2013-02-24: 15:00:00

## 2013-02-24 MED ORDER — SODIUM CHLORIDE 0.9 % IV SOLN
INTRAVENOUS | Status: DC
  Administered 2013-02-25 – 2013-02-26 (×4): via INTRAVENOUS

## 2013-02-24 MED ORDER — FENTANYL CITRATE 0.05 MG/ML IJ SOLN
INTRAMUSCULAR | Status: AC
  Filled 2013-02-24: qty 2

## 2013-02-24 MED ORDER — LACTATED RINGERS IV SOLN
INTRAVENOUS | Status: DC
  Administered 2013-02-24: 14:00:00 via INTRAVENOUS

## 2013-02-24 MED ORDER — MIDAZOLAM HCL 2 MG/2ML IJ SOLN
INTRAMUSCULAR | Status: AC
  Filled 2013-02-24: qty 2

## 2013-02-24 MED ORDER — SODIUM CHLORIDE 0.9 % IV SOLN: INTRAVENOUS | Status: DC

## 2013-02-24 MED ORDER — ROCURONIUM BROMIDE 50 MG/5ML IV SOLN
INTRAVENOUS | Status: AC
  Filled 2013-02-24: qty 1

## 2013-02-24 MED ORDER — SODIUM CHLORIDE 0.9 % IV SOLN
INTRAVENOUS | Status: DC | PRN
  Administered 2013-02-24: 15:00:00

## 2013-02-24 MED ORDER — ETOMIDATE 2 MG/ML IV SOLN
INTRAVENOUS | Status: AC
  Filled 2013-02-24: qty 10

## 2013-02-24 MED ORDER — ETOMIDATE 2 MG/ML IV SOLN
INTRAVENOUS | Status: DC | PRN
  Administered 2013-02-24: 14 mg via INTRAVENOUS

## 2013-02-24 MED ORDER — POTASSIUM CHLORIDE 10 MEQ/100ML IV SOLN
10.0000 meq | INTRAVENOUS | Status: AC
  Administered 2013-02-24: 10 meq via INTRAVENOUS
  Filled 2013-02-24: qty 300

## 2013-02-24 MED ORDER — ONDANSETRON HCL 4 MG/2ML IJ SOLN
4.0000 mg | Freq: Once | INTRAMUSCULAR | Status: AC
  Administered 2013-02-24: 4 mg via INTRAVENOUS

## 2013-02-24 MED ORDER — GLYCOPYRROLATE 0.2 MG/ML IJ SOLN
0.2000 mg | Freq: Once | INTRAMUSCULAR | Status: AC
  Administered 2013-02-24: 0.2 mg via INTRAVENOUS

## 2013-02-24 MED ORDER — FENTANYL CITRATE 0.05 MG/ML IJ SOLN: 25.0000 ug | INTRAMUSCULAR | Status: DC | PRN

## 2013-02-24 MED ORDER — GLYCOPYRROLATE 0.2 MG/ML IJ SOLN
INTRAMUSCULAR | Status: DC | PRN
  Administered 2013-02-24: 0.4 mg via INTRAVENOUS

## 2013-02-24 MED ORDER — SUCCINYLCHOLINE CHLORIDE 20 MG/ML IJ SOLN
INTRAMUSCULAR | Status: DC | PRN
  Administered 2013-02-24: 120 mg via INTRAVENOUS

## 2013-02-24 MED ORDER — ROCURONIUM BROMIDE 100 MG/10ML IV SOLN
INTRAVENOUS | Status: DC | PRN
  Administered 2013-02-24: 5 mg via INTRAVENOUS
  Administered 2013-02-24: 25 mg via INTRAVENOUS
  Administered 2013-02-24 (×3): 5 mg via INTRAVENOUS

## 2013-02-24 MED ORDER — MIDAZOLAM HCL 2 MG/2ML IJ SOLN
1.0000 mg | INTRAMUSCULAR | Status: DC | PRN
  Administered 2013-02-24 (×2): 2 mg via INTRAVENOUS

## 2013-02-24 MED ORDER — NEOSTIGMINE METHYLSULFATE 1 MG/ML IJ SOLN
INTRAMUSCULAR | Status: DC | PRN
  Administered 2013-02-24: 2 mg via INTRAVENOUS

## 2013-02-24 MED ORDER — GLYCOPYRROLATE 0.2 MG/ML IJ SOLN
INTRAMUSCULAR | Status: AC
  Filled 2013-02-24: qty 1

## 2013-02-24 MED ORDER — SUCCINYLCHOLINE CHLORIDE 20 MG/ML IJ SOLN
INTRAMUSCULAR | Status: AC
  Filled 2013-02-24: qty 1

## 2013-02-24 MED ORDER — POTASSIUM CHLORIDE 10 MEQ/100ML IV SOLN
10.0000 meq | INTRAVENOUS | Status: AC
  Administered 2013-02-24 (×2): 10 meq via INTRAVENOUS

## 2013-02-24 MED ORDER — ONDANSETRON HCL 4 MG/2ML IJ SOLN: 4.0000 mg | Freq: Once | INTRAMUSCULAR | Status: DC | PRN

## 2013-02-24 MED ORDER — ONDANSETRON HCL 4 MG/2ML IJ SOLN
INTRAMUSCULAR | Status: AC
  Filled 2013-02-24: qty 2

## 2013-02-24 MED ORDER — FENTANYL CITRATE 0.05 MG/ML IJ SOLN
INTRAMUSCULAR | Status: DC | PRN
  Administered 2013-02-24 (×2): 50 ug via INTRAVENOUS

## 2013-02-24 SURGICAL SUPPLY — 28 items
BALLN RETRIEVAL 12X15 (BALLOONS) IMPLANT
BALN RTRVL 200 6-7FR 12-15 (BALLOONS)
BASKET TRAPEZOID 3X6 (MISCELLANEOUS) IMPLANT
BASKET TRAPEZOID LITHO 2.5X5 (MISCELLANEOUS) IMPLANT
BLOCK BITE 60FR ADLT L/F BLUE (MISCELLANEOUS) ×1 IMPLANT
BSKT STON RTRVL TRAPEZOID 3X6 (MISCELLANEOUS)
DEVICE INFLATION ENCORE 26 (MISCELLANEOUS) ×1 IMPLANT
DEVICE LOCKING W-BIOPSY CAP (MISCELLANEOUS) ×2 IMPLANT
FLOOR PAD 36X40 (MISCELLANEOUS) ×2
GUIDEWIRE HYDRA JAGWIRE .35 (WIRE) IMPLANT
GUIDEWIRE JAG HINI 025X260CM (WIRE) ×1 IMPLANT
NDL HYPO 18GX1.5 BLUNT FILL (NEEDLE) IMPLANT
NEEDLE HYPO 18GX1.5 BLUNT FILL (NEEDLE) ×2 IMPLANT
PAD FLOOR 36X40 (MISCELLANEOUS) IMPLANT
ROTH PLATINUM NET UNIVERSAL (MISCELLANEOUS) ×1 IMPLANT
SNARE ROTATE MED OVAL 20MM (MISCELLANEOUS) IMPLANT
SNARE SHORT THROW 27M MED OVAL (MISCELLANEOUS) IMPLANT
SPHINCTEROTOME AUTOTOME .25 (MISCELLANEOUS) ×1 IMPLANT
SPHINCTEROTOME HYDRATOME 44 (MISCELLANEOUS) ×5 IMPLANT
SPONGE GAUZE 4X4 12PLY (GAUZE/BANDAGES/DRESSINGS) ×1 IMPLANT
SYR 20CC LL (SYRINGE) ×1 IMPLANT
SYR 3ML LL SCALE MARK (SYRINGE) IMPLANT
SYR 50ML LL SCALE MARK (SYRINGE) ×2 IMPLANT
SYSTEM CONTINUOUS INJECTION (MISCELLANEOUS) ×2 IMPLANT
TUBING ENDO SMARTCAP PENTAX (MISCELLANEOUS) ×1 IMPLANT
WALLSTENT METAL COVERED 10X60 (STENTS) IMPLANT
WALLSTENT METAL COVERED 10X80 (STENTS) IMPLANT
WATER STERILE IRR 1000ML POUR (IV SOLUTION) ×1 IMPLANT

## 2013-02-24 NOTE — Op Note (Signed)
NAME:  [name], [name]                 ACCOUNT NO.:  [id number]  MEDICAL RECORD NO.:  [id number]  LOCATION:  [facility]                          FACILITY:  [facility]  PHYSICIAN:  R. Roetta Sessions, MD FACP FACGDATE OF BIRTH:  Oct 16, 1935  DATE OF PROCEDURE:  02/24/2013 DATE OF DISCHARGE:                              OPERATIVE REPORT   PROCEDURE:  Diagnostic ERCP/incomplete  INDICATIONS FOR PROCEDURE:  A 77 year old gentleman was admitted in the hospital with a gangrenous cholecystitis, status post laparoscopic cholecystectomy yesterday.  Surgery was difficult as gallbladder was gangrenous, JP drain was placed.  His LFTs have bumped significantly overnight.  There was concern that patient may have developed a common bile duct stone.  We were consulted.  ERCP offered today.  This approach was discussed with the patient and the patient's family members at length.  Please see his consultation note for full discussion.  PROCEDURE IN DETAIL:  General endotracheal anesthesia was induced by Dr. Marcos Eke and associates.  The patient was placed in semiprone position on the OR table.  The patient was on IV Zosyn.  INSTRUMENTATION:  Pentax video chip with duodenoscope.  FINDINGS:  Cursory examination of the distal esophagus and stomach revealed no abnormalities.  Pylorus was patent.  Examination first and second portion of the duodenum was undertaken.  The ampulla of Vater was readily identifiable on the medial wall of the 2nd portion of the duodenum.  It was notable, there was a small amount of bile coming through the ampullary orifice intermittently throughout the procedure. Scout film was taken and after the scope was pulled back 55 cm from the incisors.  Using the Microvasive sphincterotome, the ampulla was approached.  Using gentle guidewire palpation, the 0.35 guidewire kept wanting to go into the pancreatic duct in spite of repositioning taking a more tangential approach.  Subsequently, using the  sphincter tone and a smaller 0.28 guidewire.  Wire cannulation of the pancreatic duct kept occurring.  Only a small injection of the pancreatic duct was made during this procedure.  The course and caliber of the pancreatic duct appeared normal.  There was small wisps of bile coming through the ampullary orifice throughout the procedure.  I exhausted the long position and short position maneuvers and trying to achieve a biliary cannulation.  Early on as the pancreatic duct was not cannulated preferentially.  I attempted to place a 5-French pancreatic duct stent by railing it over the [facility] wire and pushing it with the sphincter tone; however, when he got to the ampullary orifice, I could not get this to go into the duct and subsequently obtained a distal pigtail 5- [place] 3 cm pancreatic duct stent, and re-approached and railed it down and also could not get this stent to thread into the pancreatic duct. This maneuver was tried a 3rd time to no avail.  Subsequently, I made additional careful attempts at achieving a selective biliary tree cannulation, but was not successful after a prolonged period of effort. The patient tolerated the very prolonged procedure and general anesthesia well and was taken to the PACU in stable condition.  IMPRESSION:  Normal-appearing ampulla and normal-appearing pancreatic duct.  Unable to achieve bile duct  cannulation as described above.  Dr. Lovell Sheehan describes significant inflammation and edema in and about the liver and gallbladder fossa.  I did not see any staples anywhere near over the expected position of the bile duct.  I feel a  bile duct injury from cholecystectomy would be less likely at this point in time, but cannot completely be excluded as would be common duct stones, etc.  RECOMMENDATIONS: 1. Situation discussed at length with family and Dr. Lovell Sheehan. 2. Switch to clear liquids over night. 3. Repeat labs tomorrow morning. 4. May be best to  consider cross-sectional imaging in the way of a     pancreatic protocol CT tomorrow as the patient is not a candidate     for MRCP given placement of staples recently. 5. Further recommendations to follow.     Jonathon Bellows, MD FACP [facility]     RMR/MEDQ  D:  02/24/2013  T:  02/24/2013  Job:  [id number]  cc:   Lynnea Ferrier, MD

## 2013-02-24 NOTE — Care Management Note (Unsigned)
    Page 1 of 1   02/24/2013     2:23:47 PM   CARE MANAGEMENT NOTE 02/24/2013  Patient:  [name], [name]   Account Number:  [id number]  Date Initiated:  02/24/2013  Documentation initiated by:  Sharrie Rothman  Subjective/Objective Assessment:   Pt admitted from home with cholecystitis. Pt lives with his wife and will return home at discharge. Pt has neb machine for home use. Pt is independent with ADL's.     Action/Plan:   Will continue to follow for [facility] needs.   Anticipated DC Date:  02/27/2013   Anticipated DC Plan:  HOME/SELF CARE      DC Planning Services  CM consult      Choice offered to / List presented to:             Status of service:  In process, will continue to follow Medicare Important Message given?   (If response is "NO", the following Medicare IM given date fields will be blank) Date Medicare IM given:   Date Additional Medicare IM given:    Discharge Disposition:    Per UR Regulation:    If discussed at Long Length of Stay Meetings, dates discussed:    Comments:  02/24/13 1420 Arlyss Queen, RN BSN CM

## 2013-02-24 NOTE — Progress Notes (Signed)
Inpatient Diabetes Program Recommendations  AACE/ADA: New Consensus Statement on Inpatient Glycemic Control (2013)  Target Ranges:  Prepandial:   less than 140 mg/dL      Peak postprandial:   less than 180 mg/dL (1-2 hours)      Critically ill patients:  140 - 180 mg/dL   Results for JAVIN, NONG (MRN [id number]) as of 02/24/2013 09:44  Ref. Range 02/23/2013 04:23 02/23/2013 07:20 02/23/2013 10:51 02/23/2013 15:15 02/23/2013 17:07 02/23/2013 21:07 02/24/2013 03:57 02/24/2013 07:48  Glucose-Capillary Latest Range: 70-99 mg/dL 952 (H) 841 (H) 324 (H) 224 (H) 241 (H) 211 (H) 234 (H) 238 (H)    Inpatient Diabetes Program Recommendations Insulin - Basal: Please consider increasing Levemir to 12 units QHS.  Note: Blood glucose over the past 24 hours has ranged from 195-246 mg/dl and fasting blood glucose this morning was 238 mg/dl.  Patient remains to be NPO.  If not started on a diet today, please consider changing the frequency of CBGs and Novolog correction to Q4H.  Also, please consider increasing Levemir to 12 units QHS to further improve glycemic control.  Will continue to follow as an inpatient.  Thanks, Orlando Penner, RN, MSN, CCRN Diabetes Coordinator Inpatient Diabetes Program [phone]

## 2013-02-24 NOTE — OR Nursing (Signed)
[name] intact draining sanguinous fluid.  Voided 150cc amber urine

## 2013-02-24 NOTE — Consult Note (Signed)
Referring Provider: Dr. Lovell Sheehan Primary Care Physician:  Leo Grosser, MD Primary Gastroenterologist:  Dr. Jena Gauss   Date of Admission: 02/21/13 Date of Consultation: 02/24/13  Reason for Consultation:  Elevated LFTs status post cholecystectomy, possible ERCP  HPI:  [name] is a 77 year old male who presented February 21, 2013 to the ED due to RUQ pain and found to have cholelithiasis. CT revealed stones in the gallbladder neck and possibly the cystic duct. [place] of abdomen ordered to evaluate further, without evidence of bile duct dilation or bile duct stone. Fatty liver incidentally noted. Patient underwent a laparoscopic cholecystectomy on Aug 18th by Dr. Lovell Sheehan, noted to have a gangrenous gallbladder.   On admission, bilirubin normal at 0.4. Bilirubin increased to 4.5 from yesterday, which was 1.9. Zosyn every 8 hours. At baseline, his family states he is intermittently confused. At time of consultation, he is alert to person and occasionally situation. He has asked multiple times "what is a gallbladder?"  States sore from procedure. Nausea reported as "the main thing". No vomiting. Feels like he has to vomit but can't. No fever, chills. Tolerated clear liquids this morning for breakfast. Last dose of Lovenox at 1800 yesterday evening.   Past Medical History  Diagnosis Date  . COPD (chronic obstructive pulmonary disease)   . Hx-TIA (transient ischemic attack)   . Diabetes mellitus   . Hypertension   . Hyperlipidemia   . Anxiety     Past Surgical History  Procedure Laterality Date  . Rotator cuff repair      Prior to Admission medications   Medication Sig Start Date End Date Taking? Authorizing Provider  albuterol (PROVENTIL) (2.5 MG/3ML) 0.083% nebulizer solution Take 2.5 mg by nebulization every 6 (six) hours as needed. Shortness of breath.   Yes Historical Provider, MD  amLODipine (NORVASC) 2.5 MG tablet Take 1 tablet (2.5 mg total) by mouth daily. 10/01/12  Yes Donita Brooks, MD  aspirin EC 81 MG tablet Take 81 mg by mouth at bedtime.    Yes Historical Provider, MD  Fluticasone-Salmeterol (ADVAIR) 250-50 MCG/DOSE AEPB Inhale 1 puff into the lungs every 12 (twelve) hours. 11/10/12  Yes Donita Brooks, MD  metFORMIN (GLUCOPHAGE) 1000 MG tablet Take 1,000 mg by mouth 2 (two) times daily.   Yes Historical Provider, MD  montelukast (SINGULAIR) 10 MG tablet Take 10 mg by mouth at bedtime.   Yes Historical Provider, MD  olmesartan (BENICAR) 40 MG tablet Take 40 mg by mouth daily.   Yes Historical Provider, MD  omeprazole (PRILOSEC) 40 MG capsule Take 40 mg by mouth daily.   Yes Historical Provider, MD  sitaGLIPtin (JANUVIA) 100 MG tablet Take 1 tablet (100 mg total) by mouth daily. 10/14/12  Yes Donita Brooks, MD    Current Facility-Administered Medications  Medication Dose Route Frequency Provider Last Rate Last Dose  . acetaminophen (TYLENOL) suppository 650 mg  650 mg Rectal Q4H PRN Osvaldo Shipper, MD   650 mg at 02/23/13 2310  . acetaminophen (TYLENOL) tablet 650 mg  650 mg Oral Q6H PRN Osvaldo Shipper, MD      . antiseptic oral rinse (BIOTENE) solution 15 mL  15 mL Mouth Rinse BID Erick Blinks, MD   15 mL at 02/24/13 0800  . enoxaparin (LOVENOX) injection 40 mg  40 mg Subcutaneous Q24H Dalia Heading, MD   40 mg at 02/23/13 1821  . hydrALAZINE (APRESOLINE) injection 5 mg  5 mg Intravenous Q4H PRN Wilson Singer, MD      .  HYDROcodone-acetaminophen (NORCO/VICODIN) 5-325 MG per tablet 1-2 tablet  1-2 tablet Oral Q4H PRN Dalia Heading, MD      . HYDROmorphone (DILAUDID) injection 1 mg  1 mg Intravenous Q2H PRN Dalia Heading, MD      . insulin aspart (novoLOG) injection 0-15 Units  0-15 Units Subcutaneous TID WC Wilson Singer, MD   5 Units at 02/24/13 0813  . insulin aspart (novoLOG) injection 0-5 Units  0-5 Units Subcutaneous QHS Wilson Singer, MD   2 Units at 02/23/13 2125  . insulin detemir (LEVEMIR) injection 10 Units  10 Units Subcutaneous QHS  Erick Blinks, MD   10 Units at 02/23/13 2125  . ondansetron (ZOFRAN) tablet 4 mg  4 mg Oral Q6H PRN Nimish Normajean Glasgow, MD       Or  . ondansetron (ZOFRAN) injection 4 mg  4 mg Intravenous Q6H PRN Nimish C Gosrani, MD      . ondansetron (ZOFRAN) tablet 4 mg  4 mg Oral Q6H PRN Dalia Heading, MD       Or  . ondansetron [facility]) injection 4 mg  4 mg Intravenous Q6H PRN Dalia Heading, MD      . piperacillin-tazobactam (ZOSYN) IVPB 3.375 g  3.375 g Intravenous Q8H Dalia Heading, MD   3.375 g at 02/24/13 0945  . potassium chloride 10 mEq in 100 mL IVPB  10 mEq Intravenous Q1 Hr x 3 Dalia Heading, MD        Allergies as of 02/21/2013 - Review Complete 02/21/2013  Allergen Reaction Noted  . Ace inhibitors  02/20/2013    Family History  Problem Relation Age of Onset  . Colon cancer Neg Hx     History   Social History  . Marital Status: Married    Spouse Name: N/A    Number of Children: N/A  . Years of Education: N/A   Occupational History  . Not on file.   Social History Main Topics  . Smoking status: Never Smoker   . Smokeless tobacco: Not on file  . Alcohol Use: No  . Drug Use: No  . Sexual Activity: Not on file   Other Topics Concern  . Not on file   Social History Narrative  . No narrative on file    Review of Systems: Gen: Denies fever, chills, loss of appetite, change in weight or weight loss CV: occasional palpitations  Resp: COPD.  GI: see HPI  GU : Denies urinary burning, urinary frequency, urinary incontinence.  MS: Denies joint pain,swelling, cramping Derm: Denies rash, itching, dry skin Psych: negative Heme: Denies bruising, bleeding, and enlarged lymph nodes.  Physical Exam: Vital signs in last 24 hours: Temp:  [98 F (36.7 C)-102.5 F (39.2 C)] 98.4 F (36.9 C) (08/19 0800) Pulse Rate:  [95-113] 105 (08/19 0400) Resp:  [13-27] 27 (08/19 0800) BP: (104-175)/(50-84) 118/77 mmHg (08/19 0800) SpO2:  [94 %-100 %] 96 % (08/19 0400) Weight:  [203  lb 0.7 oz (92.1 kg)-212 lb (96.163 kg)] 203 lb 0.7 oz (92.1 kg) (08/19 0500) Last BM Date: 02/22/13 General:   Alert,  Well-developed, well-nourished, pleasant and cooperative in NAD Head:  Normocephalic and atraumatic. Eyes:  Mild scleral icterus.  Ears:  Normal auditory acuity. Nose:  No deformity, discharge,  or lesions. Mouth:  No deformity or lesions, dentition normal. Neck:  Supple; no masses or thyromegaly. Lungs:  Clear throughout to auscultation.   No wheezes, crackles, or rhonchi. No acute distress. Heart:  Regular  rate and rhythm; no murmurs, clicks, rubs,  or gallops. Abdomen:  Soft, mild discomfort RUQ as expected post-surgery. No rebound or guarding. Surgical dressings intact, JP drain with small amount of serosanguineous drainage. +BS Rectal:  Deferred  Msk:  Symmetrical without gross deformities. Normal posture. Pulses:  Normal pulses noted. Extremities:  Without clubbing or edema. Neurologic:  Alert and  Oriented to person and year, slight intermittent confusion, pleasantly confused at times. Appears this is baseline for patient, with improvement since admission. Skin:  Intact without significant lesions or rashes. Cervical Nodes:  No significant cervical adenopathy. Psych:  Alert and cooperative. Normal mood and affect.  Intake/Output from previous day: 08/18 0701 - 08/19 0700 In: 1820 [P.O.:420; I.V.:1300; IV Piggyback:100] Out: 290 [Drains:140; Blood:150] Intake/Output this shift: Total I/O In: -  Out: 650 [Urine:650]  Lab Results:  Recent Labs  02/22/13 0703 02/23/13 0532 02/24/13 0456  WBC 12.5* 13.6* 10.2  HGB 12.2* 11.9* 11.6*  HCT 34.5* 33.8* 33.7*  PLT 165 200 176   BMET  Recent Labs  02/22/13 0704 02/23/13 0532 02/24/13 0456  NA 132* 140 139  K 3.4* 4.1 3.4*  CL 101 104 103  CO2 20 24 23   GLUCOSE 280* 236* 256*  BUN 18 15 17   CREATININE 1.41* 1.52* 1.62*  CALCIUM 8.6 8.9 8.9   LFT  Recent Labs  02/22/13 0704 02/23/13 0532  02/24/13 0456  PROT 6.9 7.2 7.0  ALBUMIN 2.9* 2.9* 2.4*  AST 45* 52* 191*  ALT 42 40 93*  ALKPHOS 93 183* 113  BILITOT 1.5* 1.9* 4.5*  BILIDIR 1.0* 1.2* 3.8*  IBILI 0.5 0.7 0.7   PT/INR  Recent Labs  02/22/13 0703  LABPROT 15.9*  INR 1.30    Studies/Results: Dg Chest Port 1 View  02/23/2013   *RADIOLOGY REPORT*  Clinical Data: Hypoxia  PORTABLE CHEST - 1 VIEW  Comparison: February 20, 2013.  Findings: Hypoinflation of the lungs is noted.  No acute pulmonary disease is noted.  Cardiomediastinal silhouette appears normal.  No pleural effusions or pneumothorax noted.  IMPRESSION: No acute cardiopulmonary abnormality seen. Hypoinflation of the lungs is noted most likely due to poor respiratory effort.   Original Report Authenticated By: Lupita Raider.,  M.D.    Impression: 77 year old male status post cholecystectomy on 02/23/13 by Dr. Lovell Sheehan, found to have a gangrenous gallbladder at time of procedure. Unable to complete intra-op cholangiogram due to findings. Obstructive pattern noted with LFTs, likely due to choledocholithiasis. Bilirubin has climbed to 4.5 from 1.9 yesterday. Overall, patient is clinically stable and appropriate for an ERCP today with Dr. Jena Gauss. He is afebrile, and he is also receiving Zosyn s/p cholecystectomy.   I discussed the procedure in detail with the patient and his wife, who will most likely need to sign the consent. The risks and benefits were discussed in detail. We will proceed with an ERCP with Dr. Jena Gauss this afternoon.   Plan: NPO Hold Lovenox ERCP with Dr. Jena Gauss today LFTs in am  [name], ANP-BC [facility]     LOS: 3 days    02/24/2013, 10:34 AM   Attending note:     Patient seen and examined in preop. Patient likely has a common duct stone given clinical scenario. Less likely would be a common bile duct injury following laparoscopic cholecystectomy.The risks, limitations, alternatives, and mponderable have been  reviewed with the patient . I specifically discussed a1 in 10 chance of pancreatitis, reaction to medications, bleeding, perforation and the possibility of  a failed ERCP. Potential for sphincterotomy and stent placement also reviewed. Questions have been answered. All parties agreeable.  After talking with the patient's daughters, there is concern of some mental status changes which became more notable 3 months ago.  Acutely, recent infection  could explain some recent decline; however, he may need further evaluation of abnormal mental status per attending after biliary intervention performed.

## 2013-02-24 NOTE — Anesthesia Postprocedure Evaluation (Signed)
  Anesthesia Post-op Note  Patient: [name]  Procedure(s) Performed: Procedure(s): ENDOSCOPIC RETROGRADE CHOLANGIOPANCREATOGRAPHY (N/A)  Patient Location: PACU  Anesthesia Type:General  Level of Consciousness: sedated  Airway and Oxygen Therapy: Patient Spontanous Breathing and Patient connected to face mask oxygen  Post-op Pain: none  Post-op Assessment: Post-op Vital signs reviewed, Patient's Cardiovascular Status Stable, Respiratory Function Stable, Patent Airway and No signs of Nausea or vomiting  Post-op Vital Signs: Reviewed and stable  Complications: No apparent anesthesia complications

## 2013-02-24 NOTE — Progress Notes (Signed)
[facility] PROGRESS NOTE  [name] ZOX:[id number] DOB: 1935-09-11 DOA: 02/21/2013 PCP: Leo Grosser, MD  Assessment/Plan: 1. Acute cholecystitis, gangrenous gallbladder: Status post cholecystectomy 8/18. Management per surgery. Empiric antibiotics. 2. Elevated liver function tests: Obstructive pattern, suspected choledocholithiasis. ERCP per GI. 3. Acute renal failure: Etiology unclear. May be secondary to acute illness, perioperative period. Plan as below. 4. Acute encephalopathy: Appears stable 5. Hypertension: Stable. 6. Possible early dementia: Some agitation noted during hospital stay. Ativan as needed. 7. Diabetes mellitus: Continue long-acting insulin. Sliding-scale insulin. 8. Hyponatremia: Resolved. Likely secondary to dehydration.   Increase long-acting insulin  Strict measurement of intake and output. N.p.o. today, increase IV fluids. Likely needs more fluid. Repeat BMP in the morning. Consider nephrology consult if not improved. Consider renal ultrasound  Code Status: Full code DVT prophylaxis: Lovenox Family Communication: None present Disposition Plan: Likely home when improved  Brendia Sacks, MD  [facility]  Pager [phone] If 7PM-7AM, please contact night-coverage at [email or web address], password [facility] 02/24/2013, 12:49 PM  LOS: 3 days   Consultants:  Gen surgery, Dr. Lovell Sheehan Gastroenterology Procedures:  Laparoscopic cholecystectomy on 8/18 ERCP 8/19 Antibiotics:  Cefazolin   HPI/Subjective: Patient is finally returned from ERCP at 620 PM, seen at that time. He feels okay. No new complaints.  Objective: Filed Vitals:   02/24/13 0500 02/24/13 0600 02/24/13 0700 02/24/13 0800  BP: 115/56 117/57 123/67 118/77  Pulse:      Temp:    98.4 F (36.9 C)  TempSrc:    Oral  Resp: 22 19 23 27   Height:      Weight: 92.1 kg (203 lb 0.7 oz)     SpO2:        Intake/Output Summary (Last 24 hours) at 02/24/13 1249 Last data filed at 02/24/13  0932  Gross per 24 hour  Intake   1820 ml  Output    940 ml  Net    880 ml     Filed Weights   02/21/13 1801 02/23/13 1100 02/24/13 0500  Weight: 96.163 kg (212 lb) 96.163 kg (212 lb) 92.1 kg (203 lb 0.7 oz)    Exam:   Febrile last night, tachypneic, normotensive  General: Appears calm and comfortable. Speech fluent and clear.  Cardiovascular: Regular rate and rhythm. No murmur, rub, gallop. No lower extremity edema.  Respiratory: Clear to auscultation bilaterally. No wheezes, rales, rhonchi. Normal respiratory effort.  Abdomen: Soft.  Osseous skull to: Grossly normal tone and strength.  Data Reviewed:  Capillary blood sugars elevated  Creatinine increased 1.62. Bilirubin 4.5. AST and ALT increased.  Leukocytosis has resolved  Pending studies:   None  Scheduled Meds: . antiseptic oral rinse  15 mL Mouth Rinse BID  . enoxaparin (LOVENOX) injection  40 mg Subcutaneous Q24H  . insulin aspart  0-15 Units Subcutaneous TID WC  . insulin aspart  0-5 Units Subcutaneous QHS  . insulin detemir  10 Units Subcutaneous QHS  . piperacillin-tazobactam (ZOSYN)  IV  3.375 g Intravenous Q8H  . potassium chloride  10 mEq Intravenous Q1 Hr x 3   Continuous Infusions:   Principal Problem:   Cholecystitis, acute Active Problems:   Hypertension   Cholelithiasis   DM (diabetes mellitus)   Choledocholithiasis   Acute renal failure   Time spent 25 minutes

## 2013-02-24 NOTE — Progress Notes (Signed)
UR chart review completed.  

## 2013-02-24 NOTE — Transfer of Care (Signed)
Immediate Anesthesia Transfer of Care Note  Patient: [name]  Procedure(s) Performed: Procedure(s): ENDOSCOPIC RETROGRADE CHOLANGIOPANCREATOGRAPHY (N/A)  Patient Location: PACU  Anesthesia Type:General  Level of Consciousness: sedated  Airway & Oxygen Therapy: Patient Spontanous Breathing and Patient connected to face mask oxygen  Post-op Assessment: Report given to PACU RN  Post vital signs: Reviewed and stable  Complications: No apparent anesthesia complications

## 2013-02-24 NOTE — Progress Notes (Signed)
1 Day Post-Op  Subjective: Patient more alert and oriented this morning. Oriented x3. Mild incisional pain.  Objective: Vital signs in last 24 hours: Temp:  [98 F (36.7 C)-102.5 F (39.2 C)] 98.4 F (36.9 C) (08/19 0800) Pulse Rate:  [95-113] 105 (08/19 0400) Resp:  [13-27] 27 (08/19 0800) BP: (104-175)/(50-84) 118/77 mmHg (08/19 0800) SpO2:  [94 %-100 %] 96 % (08/19 0400) Weight:  [92.1 kg (203 lb 0.7 oz)-96.163 kg (212 lb)] 92.1 kg (203 lb 0.7 oz) (08/19 0500) Last BM Date: 02/22/13  Intake/Output from previous day: 08/18 0701 - 08/19 0700 In: 1820 [P.O.:420; I.V.:1300; IV Piggyback:100] Out: 290 [Drains:140; Blood:150] Intake/Output this shift: Total I/O In: -  Out: 650 [Urine:650]  General appearance: alert, cooperative and no distress Resp: clear to auscultation bilaterally Cardio: regular rate and rhythm, S1, S2 normal, no murmur, click, rub or gallop GI: Soft. JP drainage no and serosanguineous in nature. No bile present. Dressings dry and intact.  Lab Results:   Recent Labs  02/23/13 0532 02/24/13 0456  WBC 13.6* 10.2  HGB 11.9* 11.6*  HCT 33.8* 33.7*  PLT 200 176   BMET  Recent Labs  02/23/13 0532 02/24/13 0456  NA 140 139  K 4.1 3.4*  CL 104 103  CO2 24 23  GLUCOSE 236* 256*  BUN 15 17  CREATININE 1.52* 1.62*  CALCIUM 8.9 8.9   PT/INR  Recent Labs  02/22/13 0703  LABPROT 15.9*  INR 1.30    Studies/Results: Dg Chest Port 1 View  02/23/2013   *RADIOLOGY REPORT*  Clinical Data: Hypoxia  PORTABLE CHEST - 1 VIEW  Comparison: February 20, 2013.  Findings: Hypoinflation of the lungs is noted.  No acute pulmonary disease is noted.  Cardiomediastinal silhouette appears normal.  No pleural effusions or pneumothorax noted.  IMPRESSION: No acute cardiopulmonary abnormality seen. Hypoinflation of the lungs is noted most likely due to poor respiratory effort.   Original Report Authenticated By: Lupita Raider.,  M.D.     Anti-infectives: Anti-infectives   Start     Dose/Rate Route Frequency Ordered Stop   02/23/13 1800  piperacillin-tazobactam (ZOSYN) IVPB 3.375 g     3.375 g 12.5 mL/hr over 240 Minutes Intravenous Every 8 hours 02/23/13 1632     02/23/13 1315  ceFAZolin (ANCEF) IVPB 2 g/50 mL premix     2 g 100 mL/hr over 30 Minutes Intravenous  Once 02/23/13 1311 02/23/13 1340   02/21/13 1730  [MAR Hold]  ceFAZolin (ANCEF) IVPB 1 g/50 mL premix  Status:  Discontinued     (On MAR Hold since 02/23/13 1047)   1 g 100 mL/hr over 30 Minutes Intravenous 3 times per day 02/21/13 1715 02/23/13 1315      Assessment/Plan: s/p Procedure(s): LAPAROSCOPIC CHOLECYSTECTOMY Impression: Stable, status post laparoscopic cholecystectomy. Direct bilirubin is elevated, more likely secondary to retained gallstone. GI is being consulted for possible ERCP and stone extraction. This has been explained to the patient and family, who understand and agree to the treatment plan. We will hold off on CT scan of head until after ERCP is completed. Leukocytosis has resolved. Would continue Zosyn.  LOS: 3 days    [name] 02/24/2013

## 2013-02-24 NOTE — Anesthesia Procedure Notes (Signed)
Procedure Name: Intubation Date/Time: 02/24/2013 2:44 PM Performed by: Franco Nones Pre-anesthesia Checklist: Patient identified, Patient being monitored, Timeout performed, Emergency Drugs available and Suction available Patient Re-evaluated:Patient Re-evaluated prior to inductionOxygen Delivery Method: Circle System Utilized Preoxygenation: Pre-oxygenation with 100% oxygen Intubation Type: IV induction, Rapid sequence and Cricoid Pressure applied Ventilation: Mask ventilation without difficulty Laryngoscope Size: Miller and 2 Grade View: Grade I Tube type: Oral Tube size: 7.0 mm Number of attempts: 1 Airway Equipment and Method: stylet Placement Confirmation: ETT inserted through vocal cords under direct vision,  positive ETCO2 and breath sounds checked- equal and bilateral Secured at: 21 cm Tube secured with: Tape Dental Injury: Teeth and Oropharynx as per pre-operative assessment

## 2013-02-24 NOTE — Anesthesia Preprocedure Evaluation (Signed)
Anesthesia Evaluation  Patient identified by MRN, date of birth, ID band Patient confused    Reviewed: Allergy & Precautions, H&P , NPO status , Patient's Chart, lab work & pertinent test results  Airway Mallampati: III TM Distance: >3 FB     Dental  (+) Teeth Intact and Poor Dentition   Pulmonary COPD breath sounds clear to auscultation        Cardiovascular hypertension, Pt. on medications Rhythm:Regular Rate:Tachycardia     Neuro/Psych PSYCHIATRIC DISORDERS Anxiety Pt is confused, Ox1. Wife reports he's had worsening confusion since just before admission. Previous hx TIA's. MRI head 2012. TIA   GI/Hepatic   Endo/Other  diabetes, Type 2, Oral Hypoglycemic Agents  Renal/GU      Musculoskeletal   Abdominal   Peds  Hematology   Anesthesia Other Findings   Reproductive/Obstetrics                           Anesthesia Physical Anesthesia Plan  ASA: IV  Anesthesia Plan: General   Post-op Pain Management:    Induction: Intravenous  Airway Management Planned: Oral ETT  Additional Equipment:   Intra-op Plan:   Post-operative Plan: Extubation in OR and Possible Post-op intubation/ventilation  Informed Consent: I have reviewed the patients History and Physical, chart, labs and discussed the procedure including the risks, benefits and alternatives for the proposed anesthesia with the patient or authorized representative who has indicated his/her understanding and acceptance.     Plan Discussed with: CRNA and Surgeon  Anesthesia Plan Comments: (Pt's mental status is somewhat improved from yesterday.)        Anesthesia Quick Evaluation

## 2013-02-25 ENCOUNTER — Encounter (HOSPITAL_COMMUNITY): Payer: Self-pay | Admitting: General Surgery

## 2013-02-25 ENCOUNTER — Inpatient Hospital Stay (HOSPITAL_COMMUNITY): Payer: Medicare Other

## 2013-02-25 LAB — CBC
HCT: 30.9 % — ABNORMAL LOW (ref 39.0–52.0)
Hemoglobin: 10.8 g/dL — ABNORMAL LOW (ref 13.0–17.0)
MCH: 31 pg (ref 26.0–34.0)
MCHC: 35 g/dL (ref 30.0–36.0)

## 2013-02-25 LAB — GLUCOSE, CAPILLARY
Glucose-Capillary: 128 mg/dL — ABNORMAL HIGH (ref 70–99)
Glucose-Capillary: 176 mg/dL — ABNORMAL HIGH (ref 70–99)

## 2013-02-25 LAB — SODIUM, URINE, RANDOM: Sodium, Ur: 40 mEq/L

## 2013-02-25 LAB — HEPATIC FUNCTION PANEL
ALT: 63 U/L — ABNORMAL HIGH (ref 0–53)
AST: 115 U/L — ABNORMAL HIGH (ref 0–37)
Bilirubin, Direct: 4.6 mg/dL — ABNORMAL HIGH (ref 0.0–0.3)
Total Bilirubin: 5.1 mg/dL — ABNORMAL HIGH (ref 0.3–1.2)

## 2013-02-25 LAB — URINALYSIS, ROUTINE W REFLEX MICROSCOPIC
Ketones, ur: NEGATIVE mg/dL
Nitrite: NEGATIVE
Protein, ur: 30 mg/dL — AB
pH: 6 (ref 5.0–8.0)

## 2013-02-25 LAB — BASIC METABOLIC PANEL
BUN: 19 mg/dL (ref 6–23)
GFR calc non Af Amer: 34 mL/min — ABNORMAL LOW (ref >90)
Glucose, Bld: 175 mg/dL — ABNORMAL HIGH (ref 70–99)
Potassium: 3.6 mEq/L (ref 3.5–5.1)

## 2013-02-25 LAB — URINE MICROSCOPIC-ADD ON

## 2013-02-25 LAB — PROTEIN, URINE, RANDOM: Total Protein, Urine: 62 mg/dL

## 2013-02-25 LAB — CREATININE, URINE, RANDOM: Creatinine, Urine: 85.62 mg/dL

## 2013-02-25 MED ORDER — IOHEXOL 300 MG/ML  SOLN
80.0000 mL | Freq: Once | INTRAMUSCULAR | Status: AC | PRN
  Administered 2013-02-25: 80 mL via INTRAVENOUS

## 2013-02-25 MED ORDER — PIPERACILLIN-TAZOBACTAM 3.375 G IVPB
3.3750 g | Freq: Three times a day (TID) | INTRAVENOUS | Status: DC
  Administered 2013-02-25 – 2013-02-27 (×5): 3.375 g via INTRAVENOUS
  Filled 2013-02-25 (×15): qty 50

## 2013-02-25 MED ORDER — IOHEXOL 300 MG/ML  SOLN
50.0000 mL | Freq: Once | INTRAMUSCULAR | Status: AC | PRN
  Administered 2013-02-25: 50 mL via ORAL

## 2013-02-25 NOTE — Progress Notes (Signed)
1 Day Post-Op  Subjective: Denies any significant abdominal pain.  Objective: Vital signs in last 24 hours: Temp:  [97.4 F (36.3 C)-100 F (37.8 C)] 100 F (37.8 C) (08/20 0730) Pulse Rate:  [98-104] 100 (08/19 2200) Resp:  [15-33] 20 (08/20 0900) BP: (118-161)/(58-138) 122/66 mmHg (08/20 0900) SpO2:  [81 %-100 %] 97 % (08/19 2200) Weight:  [96.9 kg (213 lb 10 oz)] 96.9 kg (213 lb 10 oz) (08/20 0500) Last BM Date: 02/22/13  Intake/Output from previous day: 08/19 0701 - 08/20 0700 In: 2806.3 [I.V.:2456.3; IV Piggyback:350] Out: 685 [Urine:650; Drains:35] Intake/Output this shift: Total I/O In: 625 [I.V.:575; IV Piggyback:50] Out: -   General appearance: alert, cooperative and no distress GI: Soft. Incisions healing well. JP drainage serosanguineous in nature.  Lab Results:   Recent Labs  02/24/13 0456 02/25/13 0615  WBC 10.2 10.1  HGB 11.6* 10.8*  HCT 33.7* 30.9*  PLT 176 191   BMET  Recent Labs  02/24/13 0456 02/25/13 0615  NA 139 138  K 3.4* 3.6  CL 103 104  CO2 23 22  GLUCOSE 256* 175*  BUN 17 19  CREATININE 1.62* 1.88*  CALCIUM 8.9 8.5   PT/INR No results found for this basename: LABPROT, INR,  in the last 72 hours  Studies/Results: Ct Abdomen Pelvis W Wo Contrast  02/25/2013   CLINICAL DATA:  Postop cholecystectomy. Recent ERCP.  EXAM: CT ABDOMEN AND PELVIS WITHOUT AND WITH CONTRAST  TECHNIQUE: Multidetector CT imaging of the abdomen and pelvis was performed without contrast material in one or both body regions, followed by contrast material(s) and further sections in one or both body regions.  CONTRAST:  80mL OMNIPAQUE IOHEXOL 300 MG/ML SOLN, 50mL OMNIPAQUE IOHEXOL 300 MG/ML SOLN  COMPARISON:  ERCP 02/24/2013. CT 02/20/2013.  FINDINGS: Small right pleural effusion. Atelectasis or consolidation in the right lower lobe. No focal opacity in the left base. Coronary artery calcifications are noted. Oral contrast material is noted within the esophagus which  could be related to reflux or dysmotility.  Prior cholecystectomy. Surgical drain is present in the right upper quadrant period postsurgical stranding and a few locules of gas in the right upper quadrant. Pockets of gas within the right lateral abdominal wall. These findings are presumably due to recent surgery. Diffuse fatty infiltration of the liver. No biliary ductal dilatation. Spleen, pancreas, adrenals and stomach are unremarkable. 2.2 cm cyst in the midpole of the right kidney. Kidneys otherwise unremarkable. No hydronephrosis.  Diffuse colonic diverticulosis. No changes of active diverticulitis. Small bowel is decompressed. Aorta and branch vessels are calcified, non aneurysmal. Urinary bladder is unremarkable. No free fluid, free air or adenopathy. No acute bony abnormality.  IMPRESSION: Postoperative changes from prior cholecystectomy. Postsurgical changes in the right upper quadrant with postsurgical drain in place.  Fatty infiltration of the liver.  Colonic diverticulosis.  Small right pleural effusion.   Electronically Signed   By: Charlett Nose   On: 02/25/2013 11:26   Ct Head W Wo Contrast  02/25/2013   CLINICAL DATA:  Mental status changes.  EXAM: CT HEAD WITHOUT AND WITH CONTRAST  TECHNIQUE: Contiguous axial images were obtained from the base of the skull through the vertex without and with intravenous contrast  CONTRAST:  80 cc Omnipaque 300 IV.  COMPARISON:  11/30/2010  FINDINGS: Bold anterior left frontal infarct. Chronic microvascular changes throughout the deep white matter. No hemorrhage, hydrocephalus or acute infarction. No enhancing lesions following contrast administration. No acute calvarial abnormality. Visualized paranasal sinuses and mastoids  clear. Orbital soft tissues unremarkable.  IMPRESSION: Atrophy, chronic microvascular changes.  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  No acute findings.   Electronically Signed   By: Charlett Nose   On: 02/25/2013 11:18   Dg Ercp With  Sphincterotomy  02/24/2013   *RADIOLOGY REPORT*  Clinical Data: Choledocholithiasis  ERCP  Comparison:  Ultrasound 02/20/2013  Technique:  Multiple spot images obtained with the fluoroscopic device and submitted for interpretation post-procedure.  ERCP was performed by Dr. Jena Gauss.  Findings: Initial images demonstrate placement of a wire into the common duct.  No images which entirely opacify the  biliary system. Therefore, low sensitivity for choledocholithiasis.  IMPRESSION: Limited ERCP imaging.  These images were submitted for radiologic interpretation only. Please see the procedural report for the amount of contrast and the fluoroscopy time utilized.   Original Report Authenticated By: Jeronimo Greaves, M.D.    Anti-infectives: Anti-infectives   Start     Dose/Rate Route Frequency Ordered Stop   02/23/13 1800  piperacillin-tazobactam (ZOSYN) IVPB 3.375 g     3.375 g 12.5 mL/hr over 240 Minutes Intravenous Every 8 hours 02/23/13 1632     02/23/13 1315  ceFAZolin (ANCEF) IVPB 2 g/50 mL premix     2 g 100 mL/hr over 30 Minutes Intravenous  Once 02/23/13 1311 02/23/13 1340   02/21/13 1730  [MAR Hold]  ceFAZolin (ANCEF) IVPB 1 g/50 mL premix  Status:  Discontinued     (On MAR Hold since 02/23/13 1047)   1 g 100 mL/hr over 30 Minutes Intravenous 3 times per day 02/21/13 1715 02/23/13 1315      Assessment/Plan: s/p Procedure(s): ENDOSCOPIC RETROGRADE CHOLANGIOPANCREATOGRAPHY Impression: Postoperative day 2, laparoscopic cholecystectomy, postoperative day one, attempted ERCP. His total bilirubin and direct bilirubin have a slightly increased. CT scan of the abdomen today reveals no biliary dilatation and no mass within the pancreatic head. At this point, would just observe over the next 24 hours. Some of this rise in his bilirubin may be secondary to edema and inflammation from the gangrenous gallbladder. CT scan of the head reveals no acute abnormalities. He does have an old frontal infarct. Will  advance his diet to full liquid diet.  LOS: 4 days    Evanne Matsunaga A 02/25/2013

## 2013-02-25 NOTE — Progress Notes (Signed)
ANTIBIOTIC CONSULT NOTE Pharmacy Consult for Zosyn Indication: Gallbladder  Allergies  Allergen Reactions  . Ace Inhibitors     Patient Measurements: Height: 6' (182.9 cm) Weight: 213 lb 10 oz (96.9 kg) IBW/kg (Calculated) : 77.6  Vital Signs: Temp: 99.5 F (37.5 C) (08/20 1130) Temp src: Axillary (08/20 1130) BP: 146/79 mmHg (08/20 1800) Intake/Output from previous day: 08/19 0701 - 08/20 0700 In: 2806.3 [I.V.:2456.3; IV Piggyback:350] Out: 685 [Urine:650; Drains:35] Intake/Output from this shift:    Labs:  Recent Labs  02/23/13 0532 02/24/13 0456 02/25/13 0615 02/25/13 1421  WBC 13.6* 10.2 10.1  --   HGB 11.9* 11.6* 10.8*  --   PLT 200 176 191  --   LABCREA  --   --   --  85.62  CREATININE 1.52* 1.62* 1.88*  --    Estimated Creatinine Clearance: 41.6 ml/min (by C-G formula based on Cr of 1.88). No results found for this basename: VANCOTROUGH, Leodis Binet, VANCORANDOM, GENTTROUGH, GENTPEAK, GENTRANDOM, TOBRATROUGH, TOBRAPEAK, TOBRARND, AMIKACINPEAK, AMIKACINTROU, AMIKACIN,  in the last 72 hours   Microbiology: Recent Results (from the past 720 hour(s))  SURGICAL PCR SCREEN     Status: None   Collection Time    02/22/13 11:14 PM      Result Value Range Status   MRSA, PCR NEGATIVE  NEGATIVE Final   Staphylococcus aureus NEGATIVE  NEGATIVE Final   Comment:            The Xpert SA Assay (FDA     approved for NASAL specimens     in patients over 45 years of age),     is one component of     a comprehensive surveillance     program.  Test performance has     been validated by [employer] for patients greater     than or equal to 67 year old.     It is not intended     to diagnose infection nor to     guide or monitor treatment.    Medical History: Past Medical History  Diagnosis Date  . COPD (chronic obstructive pulmonary disease)   . Hx-TIA (transient ischemic attack)   . Diabetes mellitus   . Hypertension   . Hyperlipidemia   . Anxiety     Medications:  Scheduled:  . antiseptic oral rinse  15 mL Mouth Rinse BID  . enoxaparin (LOVENOX) injection  40 mg Subcutaneous Q24H  . insulin aspart  0-15 Units Subcutaneous TID WC  . insulin aspart  0-5 Units Subcutaneous QHS  . insulin detemir  10 Units Subcutaneous QHS  . [START ON 02/26/2013] piperacillin-tazobactam (ZOSYN)  IV  3.375 g Intravenous Q8H   Assessment: Okay for Protocol Estimated Creatinine Clearance: 41.6 ml/min (by C-G formula based on Cr of 1.88). Acute renal failure, s/p cholecystectomy 8/18, and incompleted ERCP  8/19  Ancef 8/16 >> 8/18 Zosyn 8/18 >>  Goal of Therapy:  Eradicate infection.  Plan:  Zosyn 3.375gm IV every 8 hours. Follow-up micro data, labs, vitals.  Mady Gemma 02/25/2013,7:32 PM

## 2013-02-25 NOTE — Anesthesia Postprocedure Evaluation (Signed)
  Anesthesia Post-op Note  Patient: [name]  Procedure(s) Performed: Procedure(s): ENDOSCOPIC RETROGRADE CHOLANGIOPANCREATOGRAPHY (N/A)  Patient Location: ICU 12  Anesthesia Type:General  Level of Consciousness: confused and lethargic (As pre-op)  Airway and Oxygen Therapy: Patient Spontanous Breathing  Post-op Pain: mild  Post-op Assessment: Post-op Vital signs reviewed, Patient's Cardiovascular Status Stable, Respiratory Function Stable, Patent Airway, No signs of Nausea or vomiting and Pain level controlled  Post-op Vital Signs: Reviewed and stable  Complications: No apparent anesthesia complications

## 2013-02-25 NOTE — Progress Notes (Signed)
[facility] PROGRESS NOTE  DERRIC DEALMEIDA ZOX:[id number] DOB: 21-Jun-1936 DOA: 02/21/2013 PCP: Leo Grosser, MD  Assessment/Plan: 1. Acute cholecystitis, gangrenous gallbladder: Status post cholecystectomy 8/18. Management per surgery. Empiric antibiotics. Leukocytosis has resolved. Afebrile greater than 24 hours. 2. Elevated liver function tests: Obstructive pattern, possible choledocholithiasis. Transaminases mildly improved, bilirubin mildly worse. ERCP per GI nondiagnostic. CT imaging today per gastroenterology. 3. Acute renal failure: Etiology unclear. May be secondary to acute illness, perioperative period. Creatinine mildly worse today, urine output incompletely documented but suspect improve. We will ask nephrology for evaluation. Check renal ultrasound. 4. Acute encephalopathy: Appears stable, likely ICU delirium superimposed on possible dementia. 5. Hypertension: Stable. 6. Suspect early dementia: Some agitation noted during hospital stay. Ativan as needed. 7. Diabetes mellitus: Stable. Continue long-acting insulin, sliding scale insulin. 8. Hyponatremia: Resolved. Likely secondary to dehydration.   Renal ultrasound, Consult nephrology, continue IV fluids, repeat metabolic panel in the morning  Continue antibiotics, management per surgery and gastroenterology.  Can transfer to medical floor if ok with surgery  Code Status: Full code DVT prophylaxis: Lovenox Family Communication: None present Disposition Plan: Likely home when improved  Brendia Sacks, MD  [facility]  Pager [phone] If 7PM-7AM, please contact night-coverage at [email or web address], password [facility] 02/25/2013, 8:26 AM  LOS: 4 days   Summary: 77 year old man who presented with right upper quadrant pain. He was admitted for suspected acute cholecystitis and seen in consultation with Gen. surgery. He underwent cholecystectomy laparoscopically which revealed a gangrenous gallbladder. Postoperatively he  developed elevation of liver function tests and choledocholithiasis was suspected and GI consultation obtained. ERCP was nondiagnostic secondary to technical difficulties. Hospitalizations been complicated by acute renal failure and encephalopathy.  Consultants:  Gen surgery, Dr. Lovell Sheehan Gastroenterology Procedures:  Laparoscopic cholecystectomy on 8/18 ERCP 8/19 Antibiotics:  Cefazolin   HPI/Subjective: Patient denies pain at this point. Noted to be confused overnight, mitts on hands.  Objective: Filed Vitals:   02/25/13 0300 02/25/13 0400 02/25/13 0500 02/25/13 0600  BP: 127/64 118/65 135/80 139/69  Pulse:      Temp:  98 F (36.7 C)    TempSrc:  Oral    Resp: 17 15 15 16   Height:      Weight:   96.9 kg (213 lb 10 oz)   SpO2:        Intake/Output Summary (Last 24 hours) at 02/25/13 0826 Last data filed at 02/25/13 0600  Gross per 24 hour  Intake 2681.25 ml  Output    485 ml  Net 2196.25 ml     Filed Weights   02/23/13 1100 02/24/13 0500 02/25/13 0500  Weight: 96.163 kg (212 lb) 92.1 kg (203 lb 0.7 oz) 96.9 kg (213 lb 10 oz)    Exam:   Afebrile greater than 24 hours. Normotensive. Borderline tachycardia.  General: Appears calm, sleeping. Awakens easily. Somewhat confused. Mitts on both hands.  Cardiovascular: Regular rate and rhythm. No murmur, rub, gallop. No lower extremity edema.  Respiratory: Clear to auscultation bilaterally. No wheezes, rales, rhonchi. Normal respiratory effort.  Abdomen: Soft, nontender.  Skin: No rash seen.  Musculoskeletal: Moves all extremities well. Tone and strength appears grossly normal all extremities.  Psychiatric: Confused.  Data Reviewed:  Urine output noted 650, 2 voids unmeasured  Basic metabolic panel notable for slightly increased creatinine 1.88.  Transaminases with some improvement. Lipase normal. Total bilirubin somewhat tired today.  CBC unremarkable, leukocytosis is resolved.  Pending studies:    None  Scheduled Meds: . antiseptic oral rinse  15 mL  Mouth Rinse BID  . enoxaparin (LOVENOX) injection  40 mg Subcutaneous Q24H  . insulin aspart  0-15 Units Subcutaneous TID WC  . insulin aspart  0-5 Units Subcutaneous QHS  . insulin detemir  10 Units Subcutaneous QHS  . piperacillin-tazobactam (ZOSYN)  IV  3.375 g Intravenous Q8H   Continuous Infusions: . sodium chloride 125 mL/hr at 02/25/13 0600    Principal Problem:   Cholecystitis, acute Active Problems:   Hypertension   Cholelithiasis   DM (diabetes mellitus)   Choledocholithiasis   Acute renal failure   Time spent 20 minutes

## 2013-02-25 NOTE — Progress Notes (Addendum)
Subjective: Patient confused, mitts on hands, wife at bedside. Denies abdominal pain, N/V. However, tender to palpation RUQ on exam. Unable to obtain accurate history due to confusion.   Objective: Vital signs in last 24 hours: Temp:  [97.4 F (36.3 C)-98.9 F (37.2 C)] 98 F (36.7 C) (08/20 0400) Pulse Rate:  [98-104] 100 (08/19 2200) Resp:  [15-33] 16 (08/20 0600) BP: (113-161)/(37-138) 139/69 mmHg (08/20 0600) SpO2:  [81 %-100 %] 97 % (08/19 2200) Weight:  [213 lb 10 oz (96.9 kg)] 213 lb 10 oz (96.9 kg) (08/20 0500) Last BM Date: 02/22/13 General:   Mild agitation.  Head:  Normocephalic and atraumatic. Eyes:  Mild icterus Heart:  S1, S2 present Lungs: Clear to auscultation bilaterally Abdomen:  Bowel sounds present, protuberant, tender to palpation RUQ without rebound or guarding Extremities:  Without clubbing or edema. Neuro: responds to name, otherwise confused regarding situation, place, year  Intake/Output from previous day: 08/19 0701 - 08/20 0700 In: 2681.3 [I.V.:2331.3; IV Piggyback:350] Out: 685 [Urine:650; Drains:35] Intake/Output this shift:    Lab Results:  Recent Labs  02/23/13 0532 02/24/13 0456 02/25/13 0615  WBC 13.6* 10.2 10.1  HGB 11.9* 11.6* 10.8*  HCT 33.8* 33.7* 30.9*  PLT 200 176 191   BMET  Recent Labs  02/23/13 0532 02/24/13 0456 02/25/13 0615  NA 140 139 138  K 4.1 3.4* 3.6  CL 104 103 104  CO2 24 23 22   GLUCOSE 236* 256* 175*  BUN 15 17 19   CREATININE 1.52* 1.62* 1.88*  CALCIUM 8.9 8.9 8.5   LFT  Recent Labs  02/23/13 0532 02/24/13 0456 02/25/13 0615  PROT 7.2 7.0 6.7  ALBUMIN 2.9* 2.4* 2.3*  AST 52* 191* 115*  ALT 40 93* 63*  ALKPHOS 183* 113 104  BILITOT 1.9* 4.5* 5.1*  BILIDIR 1.2* 3.8* 4.6*  IBILI 0.7 0.7 0.5   Lab Results  Component Value Date   LIPASE 49 02/25/2013     Studies/Results: Dg Chest Port 1 View  02/23/2013   *RADIOLOGY REPORT*  Clinical Data: Hypoxia  PORTABLE CHEST - 1 VIEW  Comparison:  February 20, 2013.  Findings: Hypoinflation of the lungs is noted.  No acute pulmonary disease is noted.  Cardiomediastinal silhouette appears normal.  No pleural effusions or pneumothorax noted.  IMPRESSION: No acute cardiopulmonary abnormality seen. Hypoinflation of the lungs is noted most likely due to poor respiratory effort.   Original Report Authenticated By: Lupita Raider.,  M.D.   Dg Ercp With Sphincterotomy  02/24/2013   *RADIOLOGY REPORT*  Clinical Data: Choledocholithiasis  ERCP  Comparison:  Ultrasound 02/20/2013  Technique:  Multiple spot images obtained with the fluoroscopic device and submitted for interpretation post-procedure.  ERCP was performed by Dr. Jena Gauss.  Findings: Initial images demonstrate placement of a wire into the common duct.  No images which entirely opacify the  biliary system. Therefore, low sensitivity for choledocholithiasis.  IMPRESSION: Limited ERCP imaging.  These images were submitted for radiologic interpretation only. Please see the procedural report for the amount of contrast and the fluoroscopy time utilized.   Original Report Authenticated By: Jeronimo Greaves, M.D.    Assessment: 77 year old male admitted with gangrenous cholecystitis, s/p cholecystectomy 8/18, with increase in LFTs and concern for CBD stone. ERCP yesterday attempted but incomplete despite significant effort and multiple attempts at bile duct cannulation.   Patient is more confused today than yesterday, now restrained due to mild agitation. Continued rise in bilirubin, but his lipase has remained normal.  Will proceed with CT  with pancreatic protocol as soon as possible, as MRCP is not indicated secondary to staples present.     Plan: CT with pancreatic protocol now Remain NPO Further recommendations to follow  Nira Retort, ANP-BC [facility] Gastroenterology    LOS: 4 days    02/25/2013, 7:47 AM    CT reveals no surprises. Post  changes present. No apparent complications from a  lengthy attempt at ERCP yesterday. Agree with continued observation. Follow labs closely. Discuss with Dr. Lovell Sheehan and family

## 2013-02-25 NOTE — Progress Notes (Signed)
UR chart review completed.  

## 2013-02-25 NOTE — Consult Note (Addendum)
[name] MRN: [id number] DOB/AGE: 11/03/1935 77 y.o. Primary Care Physician:PICKARD,WARREN TOM, MD Admit date: 02/21/2013 Chief Complaint:  Chief Complaint  Patient presents with  . Abdominal Pain   HPI:  Pt is 77 year old [place] male with past medical hx of DM , HTN wh presented to ER on 8/16 with C/o Abdominal pain,. HPI dates back to 02/19/13 when pt came to Er and was diagnosed with acute cholecystits. Pt later went home and elected not to get operated at that time. Pt came back on 02/21/13 with severe pain . Pt at that tike also c/o nausea and voiting associated with pain. Pt was  Admitted and   started on IV ABX. Pt later underwent Cholecystectomy o 02/23/13 and had gangrenous gall bladder. Pt todays offers no spefici complaints. Pt just says I am okay. NO c/o chest pain  No c/o cough/fever/chills No c/o hematuria Pt c/o minimal pain in abdomen over op site.    Past Medical History  Diagnosis Date  . COPD (chronic obstructive pulmonary disease)   . Hx-TIA (transient ischemic attack)   . Diabetes mellitus   . Hypertension   . Hyperlipidemia   . Anxiety         Family History  Problem Relation Age of Onset  . Colon cancer Neg Hx   NO hx of ESRD  Social History:  reports that he has never smoked. He does not have any smokeless tobacco history on file. He reports that he does not drink alcohol or use illicit drugs.   Allergies:  Allergies  Allergen Reactions  . Ace Inhibitors     Medications Prior to Admission  Medication Sig Dispense Refill  . albuterol (PROVENTIL) (2.5 MG/3ML) 0.083% nebulizer solution Take 2.5 mg by nebulization every 6 (six) hours as needed. Shortness of breath.      [name] Kitchen amLODipine (NORVASC) 2.5 MG tablet Take 1 tablet (2.5 mg total) by mouth daily.  30 tablet  3  . aspirin EC 81 MG tablet Take 81 mg by mouth at bedtime.       . Fluticasone-Salmeterol (ADVAIR) 250-50 MCG/DOSE AEPB Inhale 1 puff into the lungs every 12 (twelve)  hours.  60 each  11  . metFORMIN (GLUCOPHAGE) 1000 MG tablet Take 1,000 mg by mouth 2 (two) times daily.      . montelukast (SINGULAIR) 10 MG tablet Take 10 mg by mouth at bedtime.      [name] Kitchen olmesartan (BENICAR) 40 MG tablet Take 40 mg by mouth daily.      [name] Kitchen omeprazole (PRILOSEC) 40 MG capsule Take 40 mg by mouth daily.      . sitaGLIPtin (JANUVIA) 100 MG tablet Take 1 tablet (100 mg total) by mouth daily.  30 tablet  5       ZHY:QMVHQ from the symptoms mentioned above,there are no other symptoms referable to all systems reviewed.  [name] Kitchen antiseptic oral rinse  15 mL Mouth Rinse BID  . enoxaparin (LOVENOX) injection  40 mg Subcutaneous Q24H  . insulin aspart  0-15 Units Subcutaneous TID WC  . insulin aspart  0-5 Units Subcutaneous QHS  . insulin detemir  10 Units Subcutaneous QHS  . piperacillin-tazobactam (ZOSYN)  IV  3.375 g Intravenous Q8H      Physical Exam: Vital signs in last 24 hours: Temp:  [97.4 F (36.3 C)-100 F (37.8 C)] 100 F (37.8 C) (08/20 0730) Pulse Rate:  [98-104] 100 (08/19 2200) Resp:  [15-33] 20 (08/20 0900) BP: (113-161)/(37-138) 122/66 mmHg (08/20 0900)  SpO2:  [81 %-100 %] 97 % (08/19 2200) Weight:  [213 lb 10 oz (96.9 kg)] 213 lb 10 oz (96.9 kg) (08/20 0500) Weight change: 1 lb 10 oz (0.737 kg) Last BM Date: 02/22/13  Intake/Output from previous day: 08/19 0701 - 08/20 0700 In: 2806.3 [I.V.:2456.3; IV Piggyback:350] Out: 685 [Urine:650; Drains:35] Total I/O In: 275 [I.V.:275] Out: -    Physical Exam: General- pt is lethargic but arousable,follow commands . Resp- No acute REsp distress, CTA B/L NO Rhonchi CVS- S1S2 regular in rate and rhythm GIT- BS+, soft, ND, Drain in situ EXT- NO LE Edema, NO Cyanosis CNS- CN 2-12 grossly intact. Moving all 4 extremities     Lab Results: CBC  Recent Labs  02/24/13 0456 02/25/13 0615  WBC 10.2 10.1  HGB 11.6* 10.8*  HCT 33.7* 30.9*  PLT 176 191    BMET  Recent Labs  02/24/13 0456  02/25/13 0615  NA 139 138  K 3.4* 3.6  CL 103 104  CO2 23 22  GLUCOSE 256* 175*  BUN 17 19  CREATININE 1.62* 1.88*  CALCIUM 8.9 8.5    Trend Creat             8/14     8/16       8/17      8/18      8/19       8/20 2014  1.17==>1.27==>1.41==>1.52==>1.62==>1.88              1.45 in April 2014  2012   1.09  MICRO Recent Results (from the past 240 hour(s))  SURGICAL PCR SCREEN     Status: None   Collection Time    02/22/13 11:14 PM      Result Value Range Status   MRSA, PCR NEGATIVE  NEGATIVE Final   Staphylococcus aureus NEGATIVE  NEGATIVE Final   Comment:            The Xpert SA Assay (FDA     approved for NASAL specimens     in patients over 88 years of age),     is one component of     a comprehensive surveillance     program.  Test performance has     been validated by [employer] for patients greater     than or equal to 54 year old.     It is not intended     to diagnose infection nor to     guide or monitor treatment.      Lab Results  Component Value Date   CALCIUM 8.5 02/25/2013   PHOS 2.4 02/24/2013   [place]  Right Kidney: Right kidney demonstrates a 2.4 cm mid pole cyst,  but is otherwise unremarkable measuring 10.8 cm in length.  Left Kidney: Normal measuring 11.3 cm in length.   Impression: 1)Renal  AKI secondary to             Prerenal / ATN /AIN / IV contrast  AKI sec to SIRS / ARB/ Hypovolemia ( Uncontrolled DM) AKI sec to Non Oliguric ATN CKD stage  3. CKD since 2014 ( data goes back to more than 3 months on April creat was 1.45) CKD secondary to DM/ HTN Progression of CKD marked with AKI  Proteinura will check.  2)HTN BP at goal Target Organ damage  CKD CVA  Not ion any ANTI HTN at this time   3)Anemia HGb at goal (9--11)   4)CKD Mineral-Bone Disorder  PTH not avail . Secondary Hyperparathyroidism w/u pending  Phosphorus &Vitamin 25-OH will check.   5)GI- admitted with Cholecystis  now s/o  Cholecystectomy.  6)Electrolytes  Normokalemic NOrmonatremic   7)Acid base Co2 at goal     Plan: Agree with current iVf at 126ml/hr will ask for Fena and protein/ crea ratio If protein/crea ratio is high-will ask for 24hr urine  Will suggest to adjust zosyn dose Aim for SBP upto 150's Aviod nephrotoxics Aviod ACE/ARB for HTN control if needed Will ask for CKD-BMd w/u as oupt. Will follow bmet Educated pt and family about Kidney disease.    BHUTANI,MANPREET S 02/25/2013, 9:47 AM

## 2013-02-25 NOTE — Progress Notes (Signed)
Patient incontinent of urine, with condom cath in place. 400 cc urine since 0600.Renal ultrasound tech noted full bladder. Dr Irene Limbo notified, and in and out cath ordered. Post void residual 1,030cc. Dr Irene Limbo notified.

## 2013-02-26 DIAGNOSIS — R7989 Other specified abnormal findings of blood chemistry: Secondary | ICD-10-CM

## 2013-02-26 LAB — CBC
HCT: 29.1 % — ABNORMAL LOW (ref 39.0–52.0)
MCH: 30.4 pg (ref 26.0–34.0)
MCV: 91.2 fL (ref 78.0–100.0)
Platelets: 218 10*3/uL (ref 150–400)
RDW: 13.9 % (ref 11.5–15.5)
WBC: 9.8 10*3/uL (ref 4.0–10.5)

## 2013-02-26 LAB — URINE CULTURE

## 2013-02-26 LAB — GLUCOSE, CAPILLARY
Glucose-Capillary: 101 mg/dL — ABNORMAL HIGH (ref 70–99)
Glucose-Capillary: 243 mg/dL — ABNORMAL HIGH (ref 70–99)
Glucose-Capillary: 239 mg/dL — ABNORMAL HIGH (ref 70–99)

## 2013-02-26 LAB — COMPREHENSIVE METABOLIC PANEL
AST: 110 U/L — ABNORMAL HIGH (ref 0–37)
Albumin: 2 g/dL — ABNORMAL LOW (ref 3.5–5.2)
Alkaline Phosphatase: 105 U/L (ref 39–117)
BUN: 23 mg/dL (ref 6–23)
Chloride: 110 mEq/L (ref 96–112)
Potassium: 3.3 mEq/L — ABNORMAL LOW (ref 3.5–5.1)
Sodium: 146 mEq/L — ABNORMAL HIGH (ref 135–145)
Total Bilirubin: 3.6 mg/dL — ABNORMAL HIGH (ref 0.3–1.2)
Total Protein: 6.5 g/dL (ref 6.0–8.3)

## 2013-02-26 MED ORDER — FUROSEMIDE 10 MG/ML IJ SOLN
40.0000 mg | Freq: Two times a day (BID) | INTRAMUSCULAR | Status: DC
  Administered 2013-02-26 (×2): 40 mg via INTRAVENOUS
  Filled 2013-02-26 (×2): qty 4

## 2013-02-26 MED ORDER — LORAZEPAM 2 MG/ML IJ SOLN
0.5000 mg | Freq: Four times a day (QID) | INTRAMUSCULAR | Status: DC | PRN
  Administered 2013-02-26 – 2013-02-27 (×2): 0.5 mg via INTRAVENOUS
  Filled 2013-02-26 (×3): qty 1

## 2013-02-26 MED ORDER — POLYETHYLENE GLYCOL 3350 17 G PO PACK
17.0000 g | PACK | Freq: Two times a day (BID) | ORAL | Status: DC
  Administered 2013-02-26 – 2013-02-27 (×4): 17 g via ORAL
  Filled 2013-02-26 (×4): qty 1

## 2013-02-26 NOTE — Progress Notes (Signed)
Subjective: Interval History: none.  Objective: Vital signs in last 24 hours: Temp:  [97.5 F (36.4 C)-99.5 F (37.5 C)] 97.6 F (36.4 C) (08/21 0400) Pulse Rate:  [87-94] 94 (08/21 0100) Resp:  [15-24] 19 (08/21 0700) BP: (94-146)/(36-87) 103/49 mmHg (08/21 0700) SpO2:  [97 %-100 %] 97 % (08/21 0100) Weight:  [99.3 kg (218 lb 14.7 oz)] 99.3 kg (218 lb 14.7 oz) (08/21 0500) Weight change: 2.4 kg (5 lb 4.7 oz)  Intake/Output from previous day: 08/20 0701 - 08/21 0700 In: 4595 [P.O.:840; I.V.:3575; IV Piggyback:150] Out: 1030 [Urine:1030] Intake/Output this shift:    The patient seems to be very confused and restless. Chest is clear to auscultation no rhonchi or egophony. Heart exam regular rate and rhythm Abdomen hypoactive Extremities no edema.   Lab Results:  Recent Labs  02/25/13 0615 02/26/13 0517  WBC 10.1 9.8  HGB 10.8* 9.7*  HCT 30.9* 29.1*  PLT 191 218   BMET:  Recent Labs  02/25/13 0615 02/26/13 0517  NA 138 146*  K 3.6 3.3*  CL 104 110  CO2 22 25  GLUCOSE 175* 120*  BUN 19 23  CREATININE 1.88* 2.06*  CALCIUM 8.5 8.4   No results found for this basename: PTH,  in the last 72 hours Iron Studies: No results found for this basename: IRON, TIBC, TRANSFERRIN, FERRITIN,  in the last 72 hours  Studies/Results: Ct Abdomen Pelvis W Wo Contrast  02/25/2013   CLINICAL DATA:  Postop cholecystectomy. Recent ERCP.  EXAM: CT ABDOMEN AND PELVIS WITHOUT AND WITH CONTRAST  TECHNIQUE: Multidetector CT imaging of the abdomen and pelvis was performed without contrast material in one or both body regions, followed by contrast material(s) and further sections in one or both body regions.  CONTRAST:  80mL OMNIPAQUE IOHEXOL 300 MG/ML SOLN, 50mL OMNIPAQUE IOHEXOL 300 MG/ML SOLN  COMPARISON:  ERCP 02/24/2013. CT 02/20/2013.  FINDINGS: Small right pleural effusion. Atelectasis or consolidation in the right lower lobe. No focal opacity in the left base. Coronary artery  calcifications are noted. Oral contrast material is noted within the esophagus which could be related to reflux or dysmotility.  Prior cholecystectomy. Surgical drain is present in the right upper quadrant period postsurgical stranding and a few locules of gas in the right upper quadrant. Pockets of gas within the right lateral abdominal wall. These findings are presumably due to recent surgery. Diffuse fatty infiltration of the liver. No biliary ductal dilatation. Spleen, pancreas, adrenals and stomach are unremarkable. 2.2 cm cyst in the midpole of the right kidney. Kidneys otherwise unremarkable. No hydronephrosis.  Diffuse colonic diverticulosis. No changes of active diverticulitis. Small bowel is decompressed. Aorta and branch vessels are calcified, non aneurysmal. Urinary bladder is unremarkable. No free fluid, free air or adenopathy. No acute bony abnormality.  IMPRESSION: Postoperative changes from prior cholecystectomy. Postsurgical changes in the right upper quadrant with postsurgical drain in place.  Fatty infiltration of the liver.  Colonic diverticulosis.  Small right pleural effusion.   Electronically Signed   By: Charlett Nose   On: 02/25/2013 11:26   Ct Head W Wo Contrast  02/25/2013   CLINICAL DATA:  Mental status changes.  EXAM: CT HEAD WITHOUT AND WITH CONTRAST  TECHNIQUE: Contiguous axial images were obtained from the base of the skull through the vertex without and with intravenous contrast  CONTRAST:  80 cc Omnipaque 300 IV.  COMPARISON:  11/30/2010  FINDINGS: Bold anterior left frontal infarct. Chronic microvascular changes throughout the deep white matter. No hemorrhage, hydrocephalus  or acute infarction. No enhancing lesions following contrast administration. No acute calvarial abnormality. Visualized paranasal sinuses and mastoids clear. Orbital soft tissues unremarkable.  IMPRESSION: Atrophy, chronic microvascular changes.  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  No acute findings.    Electronically Signed   By: Charlett Nose   On: 02/25/2013 11:18   US Renal  02/25/2013   CLINICAL DATA:  Acute renal failure.  EXAM: RENAL/URINARY TRACT ULTRASOUND COMPLETE  COMPARISON:  CT 02/25/2013  FINDINGS: Right Kidney: 10.7 cm. Normal echotexture. No visible focal abnormality. The previously seen cyst by CT cannot be appreciated on ultrasound.  Left Kidney: 12.4 cm. Normal size and echotexture. No focal abnormality. No hydronephrosis.  Bladder:  Normal appearance.  IMPRESSION: Unremarkable study.  No hydronephrosis.   Electronically Signed   By: Charlett Nose   On: 02/25/2013 13:21   Dg Ercp With Sphincterotomy  02/24/2013   *RADIOLOGY REPORT*  Clinical Data: Choledocholithiasis  ERCP  Comparison:  Ultrasound 02/20/2013  Technique:  Multiple spot images obtained with the fluoroscopic device and submitted for interpretation post-procedure.  ERCP was performed by Dr. Jena Gauss.  Findings: Initial images demonstrate placement of a wire into the common duct.  No images which entirely opacify the  biliary system. Therefore, low sensitivity for choledocholithiasis.  IMPRESSION: Limited ERCP imaging.  These images were submitted for radiologic interpretation only. Please see the procedural report for the amount of contrast and the fluoroscopy time utilized.   Original Report Authenticated By: Jeronimo Greaves, M.D.    I have reviewed the patient's current medications.  Assessment/Plan: Problem #1 acute kidney injury presently seems to be multifactorial including prerenal syndrome/ATN/blind used acute kidney injury. Presently his BUN and creatinine seems to be increasing. Problem #2 chronic renal failure. Ultrasound left kidney 12.4 and right kidney 10.7 hence patient seems to have an equal kidney. There is no hydronephrosis. The etiology for this and kidney is not clear. Problem #3 hypertension his blood pressure seems to be reasonably controlled Problem #4 diabetes Problem #5 history of cholecystitis status  post cholecystectomy. Problem #6 anemia etiology as this moment is not clear H&H is low Problem #7 metabolic bone disease calcium is with  in acceptable range. Plan: We'll check his basic metabolic panel, phosphorus in the morning. Will start patient on Lasix 40 mg IV twice a day. We'll check iron studies in the morning.      LOS: 5 days   Shatyra Becka S 02/26/2013,8:01 AM

## 2013-02-26 NOTE — Progress Notes (Signed)
PT IS ALERT AND COOPERATIVE. REMAINS PLEASANTLY CONFUSED. UP TP RECLINER X 30. TOLERATED TRANSFER FROM BED TO CHAIR WELL. PT HAD LARGE INCONTINENT WHILE GETTING INTO CHAIR. ABDOMIEN IS SLIGHTLY LESS NISTENDED NOW. IV IN RT AC INFUSING /PATENT. RT JP CHARGED. ALL ABDOMINAL DRGS ARE DRY AND INTACT.FAMILY REMAINS AT BEDSIDE.PT TRANSFERS TO ROOM 338. REPORT CALLED TO  VAL DIDLY LPN.

## 2013-02-26 NOTE — Progress Notes (Signed)
Subjective:  Patient states abdomen is sore. Tolerating full liquids. Multiple family members at bedside.  Objective: Vital signs in last 24 hours: Temp:  [97.5 F (36.4 C)-99.5 F (37.5 C)] 97.6 F (36.4 C) (08/21 0400) Pulse Rate:  [87-94] 94 (08/21 0100) Resp:  [15-24] 18 (08/21 0800) BP: (94-146)/(36-87) 123/68 mmHg (08/21 0800) SpO2:  [97 %-100 %] 97 % (08/21 0100) Weight:  [218 lb 14.7 oz (99.3 kg)] 218 lb 14.7 oz (99.3 kg) (08/21 0500) Last BM Date: 02/24/13 General:   Alert,  Well-developed, well-nourished, pleasant and cooperative in NAD. Very conversant today. Head:  Normocephalic and atraumatic. Eyes:  Sclera clear, no icterus.  . Abdomen:  Soft, mild upper abdominal tenderness and nondistended.  Normal bowel sounds, without guarding, and without rebound.   Extremities:  Without clubbing, deformity or edema. Neurologic:  Alert and  oriented x4;  grossly normal neurologically. Skin:  Intact without significant lesions or rashes. Psych:  Alert and cooperative. Normal mood and affect.  Intake/Output from previous day: 08/20 0701 - 08/21 0700 In: 4595 [P.O.:840; I.V.:3575; IV Piggyback:150] Out: 1030 [Urine:1030] Intake/Output this shift: Total I/O In: 150 [I.V.:150] Out: -   Lab Results: CBC  Recent Labs  02/24/13 0456 02/25/13 0615 02/26/13 0517  WBC 10.2 10.1 9.8  HGB 11.6* 10.8* 9.7*  HCT 33.7* 30.9* 29.1*  MCV 88.2 88.8 91.2  PLT 176 191 218   BMET  Recent Labs  02/24/13 0456 02/25/13 0615 02/26/13 0517  NA 139 138 146*  K 3.4* 3.6 3.3*  CL 103 104 110  CO2 23 22 25   GLUCOSE 256* 175* 120*  BUN 17 19 23   CREATININE 1.62* 1.88* 2.06*  CALCIUM 8.9 8.5 8.4   LFTs  Recent Labs  02/24/13 0456 02/25/13 0615 02/26/13 0517  BILITOT 4.5* 5.1* 3.6*  BILIDIR 3.8* 4.6*  --   IBILI 0.7 0.5  --   ALKPHOS 113 104 105  AST 191* 115* 110*  ALT 93* 63* 60*  PROT 7.0 6.7 6.5  ALBUMIN 2.4* 2.3* 2.0*    Recent Labs  02/25/13 0615  LIPASE 49    PT/INR No results found for this basename: LABPROT, INR,  in the last 72 hours    Imaging Studies: Ct Abdomen Pelvis W Wo Contrast  02/25/2013   CLINICAL DATA:  Postop cholecystectomy. Recent ERCP.  EXAM: CT ABDOMEN AND PELVIS WITHOUT AND WITH CONTRAST  TECHNIQUE: Multidetector CT imaging of the abdomen and pelvis was performed without contrast material in one or both body regions, followed by contrast material(s) and further sections in one or both body regions.  CONTRAST:  80mL OMNIPAQUE IOHEXOL 300 MG/ML SOLN, 50mL OMNIPAQUE IOHEXOL 300 MG/ML SOLN  COMPARISON:  ERCP 02/24/2013. CT 02/20/2013.  FINDINGS: Small right pleural effusion. Atelectasis or consolidation in the right lower lobe. No focal opacity in the left base. Coronary artery calcifications are noted. Oral contrast material is noted within the esophagus which could be related to reflux or dysmotility.  Prior cholecystectomy. Surgical drain is present in the right upper quadrant period postsurgical stranding and a few locules of gas in the right upper quadrant. Pockets of gas within the right lateral abdominal wall. These findings are presumably due to recent surgery. Diffuse fatty infiltration of the liver. No biliary ductal dilatation. Spleen, pancreas, adrenals and stomach are unremarkable. 2.2 cm cyst in the midpole of the right kidney. Kidneys otherwise unremarkable. No hydronephrosis.  Diffuse colonic diverticulosis. No changes of active diverticulitis. Small bowel is decompressed. Aorta and branch vessels are  calcified, non aneurysmal. Urinary bladder is unremarkable. No free fluid, free air or adenopathy. No acute bony abnormality.  IMPRESSION: Postoperative changes from prior cholecystectomy. Postsurgical changes in the right upper quadrant with postsurgical drain in place.  Fatty infiltration of the liver.  Colonic diverticulosis.  Small right pleural effusion.   Electronically Signed   By: Charlett Nose   On: 02/25/2013 11:26    US  Abdomen Complete  02/20/2013   *RADIOLOGY REPORT*  Clinical Data:  Abdominal pain.  Evidence of choledocholithiasis and cholelithiasis on the current CT.  COMPLETE ABDOMINAL ULTRASOUND  Comparison:  Abdomen CT, 02/20/2013  Findings:  Gallbladder:  Multiple small gallstones are noted along with a small amount of sludge.  The wall is not well defined, but does not appear thickened.  There is some low echogenicity in the liver adjacent to the gallbladder that is likely focal fatty sparing.  No pericholecystic fluid.  There is no sonographic Murphy's sign.  Common bile duct:  Common bile duct measures 4.6 mm proximally.  It is not well seen distally.  No duct stone is evident, but a stone in the more distal duct is not excluded on this exam.  Liver:  Liver is echogenic with attenuation of the sound beam, findings consistent with diffuse fatty infiltration.  No liver mass or focal lesion is seen.  As noted above, there is some focal fatty sparing along the gallbladder fossa.  IVC:  Appears normal.  Pancreas:  Pancreas is poorly visualized and cannot be accurately assessed.  Of note, it was normal on CT.  Spleen:  Normal in size echogenicity measuring 7.7 cm.  Right Kidney:  Right kidney demonstrates a 2.4 cm mid pole cyst, but is otherwise unremarkable measuring 10.8 cm in length.  Left Kidney:  Normal measuring 11.3 cm in length.  Abdominal aorta:  No aneurysm identified.  IMPRESSION: Cholelithiasis, but no ultrasound evidence of acute cholecystitis.  No bile duct dilation or bile duct stone is seen.  The distal bile duct was not visualized.  Incomplete visualization of the pancreas.  Extensive fatty infiltration of the liver with some focal fatty sparing along the gallbladder fossa.  Right mid pole renal cyst.   Original Report Authenticated By: Amie Portland, M.D.   Ct Abdomen Pelvis W Contrast  02/20/2013   *RADIOLOGY REPORT*  Clinical Data: Abdominal pain.  CT ABDOMEN AND PELVIS WITH CONTRAST  Technique:   Multidetector CT imaging of the abdomen and pelvis was performed following the standard protocol during bolus administration of intravenous contrast.  Contrast: 50mL OMNIPAQUE IOHEXOL 300 MG/ML  SOLN, OMNIPAQUE IOHEXOL 300 MG/ML  SOLN  Comparison: None.  Findings:  BODY WALL: Unremarkable.  LOWER CHEST:  Mediastinum: Coronary artery atherosclerosis.  Small sliding-type hiatal hernia.  Lungs/pleura: No consolidation.  ABDOMEN/PELVIS:  Liver: Diffuse low attenuation consistent with steatosis.  Biliary: Layering high-density material, including in the neck or cystic duct of the gallbladder, consistent with stones.  No definite pericholecystic inflammation, although patient motion limits assessment of the gallbladder wall.  Pancreas: Unremarkable.  Spleen: Unremarkable.  Adrenals: 1 cm myelolipoma in the left adrenal gland.  Kidneys and ureters: 2 cm cyst in the interpolar right kidney. Another low attenuation focus in the posterior hilar lip on the right is too small to characterize, although also likely a cyst.  Bladder: Unremarkable.  Bowel: No obstruction. Negative for appendicitis.  Colonic diverticulosis without diverticulitis.  Sliding-type hiatal hernia as above.  Retroperitoneum: Minimal haziness of the small bowel mesentery.  Peritoneum: No  free fluid or gas.  Reproductive: Unremarkable.  Vascular: No acute abnormality.  OSSEOUS: No acute abnormalities. Advanced lower lumbar degenerative disc and facet disease, with facet overgrowth causing notable spinal canal stenosis at L4-5.  L5-S1 degenerative disc disease, advanced, with foraminal stenoses.  IMPRESSION: 1.Cholelithiasis with stones in the gallbladder neck, and possibly the cystic duct.  2.  Incidental findings include coronary artery atherosclerosis, colonic diverticulosis, and hepatic steatosis.   Original Report Authenticated By: Tiburcio Pea      Dg Ercp With Sphincterotomy  02/24/2013   *RADIOLOGY REPORT*  Clinical Data:  Choledocholithiasis  ERCP  Comparison:  Ultrasound 02/20/2013  Technique:  Multiple spot images obtained with the fluoroscopic device and submitted for interpretation post-procedure.  ERCP was performed by Dr. Jena Gauss.  Findings: Initial images demonstrate placement of a wire into the common duct.  No images which entirely opacify the  biliary system. Therefore, low sensitivity for choledocholithiasis.  IMPRESSION: Limited ERCP imaging.  These images were submitted for radiologic interpretation only. Please see the procedural report for the amount of contrast and the fluoroscopy time utilized.   Original Report Authenticated By: Jeronimo Greaves, M.D.  [2 weeks]   Assessment: 77 year old male admitted with gangrenous cholecystitis, s/p cholecystectomy 8/18, with increase in LFTs and concern for CBD stone. ERCP attempted but incomplete despite significant effort and multiple attempts at bile duct cannulation. CT did not show any biliary dilation.  Bilirubin coming down. Clinically patient improving. Patient tolerating diet.   Plan: 1. Continue to monitor LFTs. 2. We will continue to follow with you.   LOS: 5 days   Tana Coast  02/26/2013, 8:07 AM

## 2013-02-26 NOTE — Progress Notes (Signed)
2 Days Post-Op  Subjective: Feels better. Denies any significant abdominal pain. Is passing flatus, but no bowel movement yet.  Objective: Vital signs in last 24 hours: Temp:  [97.5 F (36.4 C)-99.5 F (37.5 C)] 99 F (37.2 C) (08/21 0730) Pulse Rate:  [87-94] 94 (08/21 0100) Resp:  [15-24] 18 (08/21 0800) BP: (94-146)/(36-87) 123/68 mmHg (08/21 0800) SpO2:  [97 %-100 %] 97 % (08/21 0100) Weight:  [99.3 kg (218 lb 14.7 oz)] 99.3 kg (218 lb 14.7 oz) (08/21 0500) Last BM Date: 02/24/13  Intake/Output from previous day: 08/20 0701 - 08/21 0700 In: 4595 [P.O.:840; I.V.:3575; IV Piggyback:150] Out: 1030 [Urine:1030] Intake/Output this shift: Total I/O In: 150 [I.V.:150] Out: -   General appearance: alert, cooperative and no distress GI: Soft. Dressings dry and intact. JP drainage serosanguineous with some bilious material present.  Lab Results:   Recent Labs  02/25/13 0615 02/26/13 0517  WBC 10.1 9.8  HGB 10.8* 9.7*  HCT 30.9* 29.1*  PLT 191 218   BMET  Recent Labs  02/25/13 0615 02/26/13 0517  NA 138 146*  K 3.6 3.3*  CL 104 110  CO2 22 25  GLUCOSE 175* 120*  BUN 19 23  CREATININE 1.88* 2.06*  CALCIUM 8.5 8.4   PT/INR No results found for this basename: LABPROT, INR,  in the last 72 hours  Studies/Results: Ct Abdomen Pelvis W Wo Contrast  02/25/2013   CLINICAL DATA:  Postop cholecystectomy. Recent ERCP.  EXAM: CT ABDOMEN AND PELVIS WITHOUT AND WITH CONTRAST  TECHNIQUE: Multidetector CT imaging of the abdomen and pelvis was performed without contrast material in one or both body regions, followed by contrast material(s) and further sections in one or both body regions.  CONTRAST:  80mL OMNIPAQUE IOHEXOL 300 MG/ML SOLN, 50mL OMNIPAQUE IOHEXOL 300 MG/ML SOLN  COMPARISON:  ERCP 02/24/2013. CT 02/20/2013.  FINDINGS: Small right pleural effusion. Atelectasis or consolidation in the right lower lobe. No focal opacity in the left base. Coronary artery calcifications  are noted. Oral contrast material is noted within the esophagus which could be related to reflux or dysmotility.  Prior cholecystectomy. Surgical drain is present in the right upper quadrant period postsurgical stranding and a few locules of gas in the right upper quadrant. Pockets of gas within the right lateral abdominal wall. These findings are presumably due to recent surgery. Diffuse fatty infiltration of the liver. No biliary ductal dilatation. Spleen, pancreas, adrenals and stomach are unremarkable. 2.2 cm cyst in the midpole of the right kidney. Kidneys otherwise unremarkable. No hydronephrosis.  Diffuse colonic diverticulosis. No changes of active diverticulitis. Small bowel is decompressed. Aorta and branch vessels are calcified, non aneurysmal. Urinary bladder is unremarkable. No free fluid, free air or adenopathy. No acute bony abnormality.  IMPRESSION: Postoperative changes from prior cholecystectomy. Postsurgical changes in the right upper quadrant with postsurgical drain in place.  Fatty infiltration of the liver.  Colonic diverticulosis.  Small right pleural effusion.   Electronically Signed   By: Charlett Nose   On: 02/25/2013 11:26   Ct Head W Wo Contrast  02/25/2013   CLINICAL DATA:  Mental status changes.  EXAM: CT HEAD WITHOUT AND WITH CONTRAST  TECHNIQUE: Contiguous axial images were obtained from the base of the skull through the vertex without and with intravenous contrast  CONTRAST:  80 cc Omnipaque 300 IV.  COMPARISON:  11/30/2010  FINDINGS: Bold anterior left frontal infarct. Chronic microvascular changes throughout the deep white matter. No hemorrhage, hydrocephalus or acute infarction. No enhancing lesions  following contrast administration. No acute calvarial abnormality. Visualized paranasal sinuses and mastoids clear. Orbital soft tissues unremarkable.  IMPRESSION: Atrophy, chronic microvascular changes.  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  No acute findings.   Electronically Signed    By: Charlett Nose   On: 02/25/2013 11:18   US Renal  02/25/2013   CLINICAL DATA:  Acute renal failure.  EXAM: RENAL/URINARY TRACT ULTRASOUND COMPLETE  COMPARISON:  CT 02/25/2013  FINDINGS: Right Kidney: 10.7 cm. Normal echotexture. No visible focal abnormality. The previously seen cyst by CT cannot be appreciated on ultrasound.  Left Kidney: 12.4 cm. Normal size and echotexture. No focal abnormality. No hydronephrosis.  Bladder:  Normal appearance.  IMPRESSION: Unremarkable study.  No hydronephrosis.   Electronically Signed   By: Charlett Nose   On: 02/25/2013 13:21   Dg Ercp With Sphincterotomy  02/24/2013   *RADIOLOGY REPORT*  Clinical Data: Choledocholithiasis  ERCP  Comparison:  Ultrasound 02/20/2013  Technique:  Multiple spot images obtained with the fluoroscopic device and submitted for interpretation post-procedure.  ERCP was performed by Dr. Jena Gauss.  Findings: Initial images demonstrate placement of a wire into the common duct.  No images which entirely opacify the  biliary system. Therefore, low sensitivity for choledocholithiasis.  IMPRESSION: Limited ERCP imaging.  These images were submitted for radiologic interpretation only. Please see the procedural report for the amount of contrast and the fluoroscopy time utilized.   Original Report Authenticated By: Jeronimo Greaves, M.D.    Anti-infectives: Anti-infectives   Start     Dose/Rate Route Frequency Ordered Stop   02/26/13 0000  piperacillin-tazobactam (ZOSYN) IVPB 3.375 g     3.375 g 12.5 mL/hr over 240 Minutes Intravenous Every 8 hours 02/25/13 1931     02/23/13 1800  piperacillin-tazobactam (ZOSYN) IVPB 3.375 g  Status:  Discontinued     3.375 g 12.5 mL/hr over 240 Minutes Intravenous Every 8 hours 02/23/13 1632 02/25/13 1920   02/23/13 1315  ceFAZolin (ANCEF) IVPB 2 g/50 mL premix     2 g 100 mL/hr over 30 Minutes Intravenous  Once 02/23/13 1311 02/23/13 1340   02/21/13 1730  [MAR Hold]  ceFAZolin (ANCEF) IVPB 1 g/50 mL premix  Status:   Discontinued     (On MAR Hold since 02/23/13 1047)   1 g 100 mL/hr over 30 Minutes Intravenous 3 times per day 02/21/13 1715 02/23/13 1315      Assessment/Plan: s/p Procedure(s): ENDOSCOPIC RETROGRADE CHOLANGIOPANCREATOGRAPHY Impression: Status post laparoscopic cholecystectomy for gangrenous cholecystitis. His total bilirubin has decreased today. He does have some bilious drainage in the drain, though this is not unexpected given his recent surgery and attempted ERCP. Acute renal failure is being monitored by nephrology. Would continue IV Zosyn for now. Agree with transfer to regular floor. We'll start MiraLAX. Advance to carb modified diet.  LOS: 5 days    Rakhi Romagnoli A 02/26/2013

## 2013-02-26 NOTE — Progress Notes (Signed)
[facility] PROGRESS NOTE  BAZIL DHANANI NWG:[id number] DOB: 1935/07/24 DOA: 02/21/2013 PCP: Leo Grosser, MD  Assessment/Plan: 1. Acute cholecystitis, gangrenous gallbladder: Remains afebrile, no leukocytosis. Status post cholecystectomy 8/18. Management per surgery. Discussed with Dr. Lovell Sheehan, will treat with IV antibiotics total 5 days then switch to oral. 2. Elevated liver function tests: Obstructive pattern, possible choledocholithiasis. LFTs continue to improve. ERCP was nondiagnostic. 3. Acute renal failure: Renal ultrasound unremarkable. There is a component of urinary retention. Multifactorial, likely prerenal syndrome, ATN, IV contrast. Lasix per nephrology. Expect improvement in the next 48 hours. Nephrology consultation appreciated. 4. Acute encephalopathy: Better today, likely ICU delirium superimposed on possible dementia. 5. Urinary retention: Likely perioperative in nature. 6. Hypertension: Stable. 7. Suspect early dementia: Some agitation noted during hospital stay. Ativan as needed. 8. Diabetes mellitus: Stable. Continue long-acting insulin, sliding scale insulin. 9. Hyponatremia: Resolved. Likely secondary to dehydration.   Monitor for recurrent urinary retention.  Follow renal function, management per nephrology  Continue IV antibiotics through tomorrow then switch to oral  Repeat CMP in the morning  Transfer to medical floor  Pending studies:    urine culture  Code Status: Full code DVT prophylaxis: Lovenox Family Communication: Discussed with wife , son Fowler at bedside Disposition Plan: Likely home when improved  Brendia Sacks, MD  [facility]  Pager [phone] If 7PM-7AM, please contact night-coverage at [email or web address], password [facility] 02/26/2013, 8:22 AM  LOS: 5 days   Summary: 77 year old man who presented with right upper quadrant pain. He was admitted for suspected acute cholecystitis and seen in consultation with Gen. surgery. He  underwent cholecystectomy laparoscopically which revealed a gangrenous gallbladder. Postoperatively he developed elevation of liver function tests and choledocholithiasis was suspected and GI consultation obtained. ERCP was nondiagnostic secondary to technical difficulties. Hospitalizations been complicated by acute renal failure and encephalopathy.  Consultants:  Gen surgery, Dr. Lovell Sheehan Gastroenterology Procedures:  Laparoscopic cholecystectomy on 8/18 ERCP 8/19: Normal-appearing ampulla and normal-appearing pancreatic duct. Unable to achieve bile duct cannulation   Antibiotics:  Zosyn 8/18 >>   HPI/Subjective: Urinary retention noted. Patient denies pain, no nausea or vomiting. No complaints. Still confused but better today per family.  Objective: Filed Vitals:   02/26/13 0600 02/26/13 0700 02/26/13 0730 02/26/13 0800  BP:  103/49  123/68  Pulse:      Temp:   99 F (37.2 C)   TempSrc:   Axillary   Resp: 17 19  18   Height:      Weight:      SpO2:        Intake/Output Summary (Last 24 hours) at 02/26/13 2952 Last data filed at 02/26/13 0800  Gross per 24 hour  Intake   4620 ml  Output   1030 ml  Net   3590 ml     Filed Weights   02/24/13 0500 02/25/13 0500 02/26/13 0500  Weight: 92.1 kg (203 lb 0.7 oz) 96.9 kg (213 lb 10 oz) 99.3 kg (218 lb 14.7 oz)    Exam:   Afebrile greater than 48 hours. Vital signs stable.  General: Appears calm, comfortable today. No restraints.   Psychiatric: He is more alert and oriented today. More calm today. He is oriented to himself, wife, son, daughter-in-law. He thinks he is in [facility]. In short month or year. Knows president.  Cardiovascular: Regular rate and rhythm. No murmur, rub, gallop. No lower extremity edema.  Telemetry: Sinus rhythm.  Respiratory: Clear to auscultation bilaterally. No wheezes, rales, rhonchi. Normal respiratory effort.  Abdomen:  Soft, nontender, nondistended.  Skin: Appears  unremarkable.  Muscular skeletal: Moves arms and legs without difficulty.  Neurologic: Grossly nonfocal.  Data Reviewed:  Urine output 1030.   Capillary blood sugars stable  Creatinine continues to rise 1.88 >> 2.06  Bilirubin, AST and ALT have decreased.  Hemoglobin slowly trending downwards  Urinalysis was equivocal  Renal ultrasound unremarkable  CT abdomen and pelvis without acute issues. CT head unremarkable.     Scheduled Meds: . antiseptic oral rinse  15 mL Mouth Rinse BID  . enoxaparin (LOVENOX) injection  40 mg Subcutaneous Q24H  . furosemide  40 mg Intravenous BID  . insulin aspart  0-15 Units Subcutaneous TID WC  . insulin aspart  0-5 Units Subcutaneous QHS  . insulin detemir  10 Units Subcutaneous QHS  . piperacillin-tazobactam (ZOSYN)  IV  3.375 g Intravenous Q8H   Continuous Infusions: . sodium chloride 150 mL/hr at 02/26/13 0800    Principal Problem:   Cholecystitis, acute Active Problems:   Hypertension   Cholelithiasis   DM (diabetes mellitus)   Choledocholithiasis   Acute renal failure   Time spent 20 minutes

## 2013-02-27 DIAGNOSIS — G9340 Encephalopathy, unspecified: Secondary | ICD-10-CM

## 2013-02-27 DIAGNOSIS — R339 Retention of urine, unspecified: Secondary | ICD-10-CM

## 2013-02-27 LAB — BASIC METABOLIC PANEL
CO2: 29 mEq/L (ref 19–32)
Chloride: 103 mEq/L (ref 96–112)
GFR calc Af Amer: 35 mL/min — ABNORMAL LOW (ref >90)
Potassium: 2.9 mEq/L — ABNORMAL LOW (ref 3.5–5.1)

## 2013-02-27 LAB — CBC
HCT: 30.4 % — ABNORMAL LOW (ref 39.0–52.0)
Hemoglobin: 10.3 g/dL — ABNORMAL LOW (ref 13.0–17.0)
MCV: 89.9 fL (ref 78.0–100.0)
WBC: 12.1 10*3/uL — ABNORMAL HIGH (ref 4.0–10.5)

## 2013-02-27 LAB — GLUCOSE, CAPILLARY
Glucose-Capillary: 173 mg/dL — ABNORMAL HIGH (ref 70–99)
Glucose-Capillary: 206 mg/dL — ABNORMAL HIGH (ref 70–99)
Glucose-Capillary: 128 mg/dL — ABNORMAL HIGH (ref 70–99)

## 2013-02-27 LAB — HEPATIC FUNCTION PANEL
ALT: 82 U/L — ABNORMAL HIGH (ref 0–53)
AST: 134 U/L — ABNORMAL HIGH (ref 0–37)
Albumin: 2.2 g/dL — ABNORMAL LOW (ref 3.5–5.2)
Alkaline Phosphatase: 130 U/L — ABNORMAL HIGH (ref 39–117)
Bilirubin, Direct: 1.7 mg/dL — ABNORMAL HIGH (ref 0.0–0.3)
Total Bilirubin: 2.5 mg/dL — ABNORMAL HIGH (ref 0.3–1.2)

## 2013-02-27 LAB — FERRITIN: Ferritin: 6217 ng/mL — ABNORMAL HIGH (ref 22–322)

## 2013-02-27 LAB — IRON AND TIBC: TIBC: 154 ug/dL — ABNORMAL LOW (ref 215–435)

## 2013-02-27 LAB — PHOSPHORUS: Phosphorus: 3.1 mg/dL (ref 2.3–4.6)

## 2013-02-27 MED ORDER — POTASSIUM CHLORIDE CRYS ER 20 MEQ PO TBCR
40.0000 meq | EXTENDED_RELEASE_TABLET | Freq: Two times a day (BID) | ORAL | Status: AC
  Administered 2013-02-27 (×2): 40 meq via ORAL
  Filled 2013-02-27: qty 2
  Filled 2013-02-27: qty 1

## 2013-02-27 MED ORDER — POTASSIUM CHLORIDE IN NACL 40-0.9 MEQ/L-% IV SOLN
INTRAVENOUS | Status: DC
  Administered 2013-02-27: 10:00:00 via INTRAVENOUS

## 2013-02-27 MED ORDER — HALOPERIDOL 0.5 MG PO TABS
0.5000 mg | ORAL_TABLET | Freq: Three times a day (TID) | ORAL | Status: DC | PRN
  Administered 2013-02-27 – 2013-02-28 (×5): 0.5 mg via ORAL
  Filled 2013-02-27: qty 1

## 2013-02-27 MED ORDER — POTASSIUM CHLORIDE 10 MEQ/100ML IV SOLN: 10.0000 meq | INTRAVENOUS | Status: DC

## 2013-02-27 NOTE — Progress Notes (Signed)
REVIEWED.  

## 2013-02-27 NOTE — Clinical Social Work Psychosocial (Signed)
    Clinical Social Work Department BRIEF PSYCHOSOCIAL ASSESSMENT 02/27/2013  Patient:  [name], [name]     Account Number:  [id number]     Admit date:  02/21/2013  Clinical Social Worker:  Santa Genera, CLINICAL SOCIAL WORKER  Date/Time:  02/27/2013 04:00 PM  Referred by:  RN  Date Referred:  02/27/2013 Referred for  SNF Placement   Other Referral:   Interview type:  Family Other interview type:    PSYCHOSOCIAL DATA Living Status:  FAMILY Admitted from facility:   Level of care:   Primary support name:  [name] Primary support relationship to patient:  SPOUSE Degree of support available:   Patient has good family support from wife and 6 children    CURRENT CONCERNS Current Concerns  Post-Acute Placement   Other Concerns:    SOCIAL WORK ASSESSMENT / PLAN CSW met w family at bedside, patient not oriented and sleeping.  Explained PT recommendation for SNF rehab due to concerns about managing patient's care needs at home in light of his altered mental status.  Wife reluctant to consider SNF placement, says they have always promised each other that they would care for each other at home.  Willing to have CSW seek bed offers, but wants to discuss options w her children who are also involved and supportive.    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  Lives at home w wife, children all live nearby and help out.    CSW explained SNF placement process, and that family can decline bed offers at discharge if they desire to take him home.  Family referred to RN CM to discuss options for home health services if desired.   Assessment/plan status:  Psychosocial Support/Ongoing Assessment of Needs Other assessment/ plan:   Information/referral to community resources:   [employer] list    PATIENT'S/FAMILY'S RESPONSE TO PLAN OF CARE: Family seems to prefer to take patient home but willing to have CSW seek bed offers.        Santa Genera, LCSW Clinical Social Worker [phone])

## 2013-02-27 NOTE — Progress Notes (Addendum)
Called by nursing staff regarding patient's confusion/agitation. According to my colleague, the patient has had encephalopathy during this hospitalization. I spoke with the daughter at the bedside he tells me that prior to hospitalization, there was no indication of any confusion or memory loss. On physical examination, he is afebrile and hemodynamically stable. He looks clinically dehydrated. He is delirious. There are no focal neurological signs. Lung fields are clear. Abdomen is soft and nontender.  Impression: 1. Acute metabolic encephalopathy/delirium. Multifactorial, combination of sepsis, dehydration, hypernatremia. 2. No IV access.  Plan: 1. PICC line placement early tomorrow. 2. Discontinue IV Lasix. Encourage oral water intake. 3. Haloperidol intramuscularly when necessary to see if this will help his delirium until the etiology is corrected.

## 2013-02-27 NOTE — Progress Notes (Signed)
Inpatient Diabetes Program Recommendations  AACE/ADA: New Consensus Statement on Inpatient Glycemic Control (2013)  Target Ranges:  Prepandial:   less than 140 mg/dL      Peak postprandial:   less than 180 mg/dL (1-2 hours)      Critically ill patients:  140 - 180 mg/dL   Hyperglycemia following meals  Inpatient Diabetes Program Recommendations Insulin - Basal: Please consider increasing Levemir to 12 units QHS. Insulin - Meal Coverage: Please consider addition of meal coverage-Novolog 3-4 units tidwc  Thank you, Lenor Coffin, RN, CNS, Diabetes Coordinator [phone])

## 2013-02-27 NOTE — Plan of Care (Signed)
Problem: Phase I Progression Outcomes Goal: Sutures/staples intact Talked to wife about OR closures and how to take care of at home

## 2013-02-27 NOTE — Progress Notes (Signed)
Subjective: Interval History: no complaint.   Objective: Vital signs in last 24 hours: Temp:  [97.6 F (36.4 C)-98.9 F (37.2 C)] 98.2 F (36.8 C) (08/22 0609) Pulse Rate:  [69-86] 69 (08/22 0609) Resp:  [17-20] 18 (08/22 0609) BP: (115-165)/(68-118) 115/68 mmHg (08/22 0609) SpO2:  [96 %-99 %] 99 % (08/22 0609) Weight change:   Intake/Output from previous day: 08/21 0701 - 08/22 0700 In: 1930 [P.O.:480; I.V.:1350; IV Piggyback:100] Out: 950 [Drains:950] Intake/Output this shift:    The patient seems to be very confused and restless seems disoriented Chest is clear to auscultation no rhonchi or egophony. Heart exam regular rate and rhythm Abdomen  possetive bowl sound Extremities no edema.   Lab Results:  Recent Labs  02/26/13 0517 02/27/13 0529  WBC 9.8 12.1*  HGB 9.7* 10.3*  HCT 29.1* 30.4*  PLT 218 279   BMET:   Recent Labs  02/26/13 0517 02/27/13 0529  NA 146* 143  K 3.3* 2.9*  CL 110 103  CO2 25 29  GLUCOSE 120* 209*  BUN 23 26*  CREATININE 2.06* 2.05*  CALCIUM 8.4 8.8   No results found for this basename: PTH,  in the last 72 hours Iron Studies: No results found for this basename: IRON, TIBC, TRANSFERRIN, FERRITIN,  in the last 72 hours  Studies/Results: Ct Abdomen Pelvis W Wo Contrast  02/25/2013   CLINICAL DATA:  Postop cholecystectomy. Recent ERCP.  EXAM: CT ABDOMEN AND PELVIS WITHOUT AND WITH CONTRAST  TECHNIQUE: Multidetector CT imaging of the abdomen and pelvis was performed without contrast material in one or both body regions, followed by contrast material(s) and further sections in one or both body regions.  CONTRAST:  80mL OMNIPAQUE IOHEXOL 300 MG/ML SOLN, 50mL OMNIPAQUE IOHEXOL 300 MG/ML SOLN  COMPARISON:  ERCP 02/24/2013. CT 02/20/2013.  FINDINGS: Small right pleural effusion. Atelectasis or consolidation in the right lower lobe. No focal opacity in the left base. Coronary artery calcifications are noted. Oral contrast material is noted  within the esophagus which could be related to reflux or dysmotility.  Prior cholecystectomy. Surgical drain is present in the right upper quadrant period postsurgical stranding and a few locules of gas in the right upper quadrant. Pockets of gas within the right lateral abdominal wall. These findings are presumably due to recent surgery. Diffuse fatty infiltration of the liver. No biliary ductal dilatation. Spleen, pancreas, adrenals and stomach are unremarkable. 2.2 cm cyst in the midpole of the right kidney. Kidneys otherwise unremarkable. No hydronephrosis.  Diffuse colonic diverticulosis. No changes of active diverticulitis. Small bowel is decompressed. Aorta and branch vessels are calcified, non aneurysmal. Urinary bladder is unremarkable. No free fluid, free air or adenopathy. No acute bony abnormality.  IMPRESSION: Postoperative changes from prior cholecystectomy. Postsurgical changes in the right upper quadrant with postsurgical drain in place.  Fatty infiltration of the liver.  Colonic diverticulosis.  Small right pleural effusion.   Electronically Signed   By: Charlett Nose   On: 02/25/2013 11:26   Ct Head W Wo Contrast  02/25/2013   CLINICAL DATA:  Mental status changes.  EXAM: CT HEAD WITHOUT AND WITH CONTRAST  TECHNIQUE: Contiguous axial images were obtained from the base of the skull through the vertex without and with intravenous contrast  CONTRAST:  80 cc Omnipaque 300 IV.  COMPARISON:  11/30/2010  FINDINGS: Bold anterior left frontal infarct. Chronic microvascular changes throughout the deep white matter. No hemorrhage, hydrocephalus or acute infarction. No enhancing lesions following contrast administration. No acute calvarial abnormality.  Visualized paranasal sinuses and mastoids clear. Orbital soft tissues unremarkable.  IMPRESSION: Atrophy, chronic microvascular changes.  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  No acute findings.   Electronically Signed   By: Charlett Nose   On: 02/25/2013 11:18    US Renal  02/25/2013   CLINICAL DATA:  Acute renal failure.  EXAM: RENAL/URINARY TRACT ULTRASOUND COMPLETE  COMPARISON:  CT 02/25/2013  FINDINGS: Right Kidney: 10.7 cm. Normal echotexture. No visible focal abnormality. The previously seen cyst by CT cannot be appreciated on ultrasound.  Left Kidney: 12.4 cm. Normal size and echotexture. No focal abnormality. No hydronephrosis.  Bladder:  Normal appearance.  IMPRESSION: Unremarkable study.  No hydronephrosis.   Electronically Signed   By: Charlett Nose   On: 02/25/2013 13:21    I have reviewed the patient's current medications.  Assessment/Plan: Problem #1 acute kidney injury presently seems to be multifactorial including prerenal syndrome/ATN/blind used acute kidney injury. Presently his BUN and creatinine seems stabilizing Problem #2 chronic renal failure. Ultrasound left kidney 12.4 and right kidney 10.7 hence patient seems to have an equal kidney. There is no hydronephrosis. The etiology for this and kidney is not clear. Problem #3 hypertension his blood pressure seems to be reasonably controlled Problem #4 diabetes Problem #5 history of cholecystitis status post cholecystectomy. Problem #6 anemia etiology as this moment is not clear H&H is low Problem #7 metabolic bone disease calcium is with  in acceptable range and phospherous is normal Problem #8 hypokalemia  Most likely due to lasix Plan:1] We'll check his basic metabolic panel and CBC  in am         2]Will start patient on Lasix 40 mg IV twice a day.        3]We will change his iv fluid to 1/2 ns with 40 meq kcl at 125 cc/hr        4]Give lasix 10 meq in 100 cc of normal saline over 1 hour time 3 doses       LOS: 6 days   Chaise Mahabir S 02/27/2013,7:53 AM

## 2013-02-27 NOTE — Progress Notes (Signed)
Subjective:  Patient more agitated throughtout the night. Has mittens on. Denies abdominal pain.  Objective: Vital signs in last 24 hours: Temp:  [97.6 F (36.4 C)-98.9 F (37.2 C)] 98.2 F (36.8 C) (08/22 0609) Pulse Rate:  [69-86] 69 (08/22 0609) Resp:  [17-20] 18 (08/22 0609) BP: (115-165)/(68-118) 115/68 mmHg (08/22 0609) SpO2:  [96 %-99 %] 99 % (08/22 0609) Last BM Date: 02/26/13 General:   Alert,  Well-developed, well-nourished, pleasant and cooperative in NAD Head:  Normocephalic and atraumatic. Eyes:  Sclera clear, no icterus.   Abdomen:  Soft, mild diffuse tenderness, nondistended.   Normal bowel sounds, without guarding, and without rebound.   Extremities:  Without clubbing, deformity or edema. Neurologic:  Alert and  oriented to person. Knows family members,  grossly normal neurologically. Skin:  Intact without significant lesions or rashes. Psych:  Alert and cooperative. Normal mood and affect.  Intake/Output from previous day: 08/21 0701 - 08/22 0700 In: 1930 [P.O.:480; I.V.:1350; IV Piggyback:100] Out: 950 [Drains:950] Intake/Output this shift:    Lab Results: CBC  Recent Labs  02/25/13 0615 02/26/13 0517 02/27/13 0529  WBC 10.1 9.8 12.1*  HGB 10.8* 9.7* 10.3*  HCT 30.9* 29.1* 30.4*  MCV 88.8 91.2 89.9  PLT 191 218 279   BMET  Recent Labs  02/25/13 0615 02/26/13 0517 02/27/13 0529  NA 138 146* 143  K 3.6 3.3* 2.9*  CL 104 110 103  CO2 22 25 29   GLUCOSE 175* 120* 209*  BUN 19 23 26*  CREATININE 1.88* 2.06* 2.05*  CALCIUM 8.5 8.4 8.8   LFTs  Recent Labs  02/25/13 0615 02/26/13 0517 02/27/13 0529  BILITOT 5.1* 3.6* 2.5*  BILIDIR 4.6*  --  1.7*  IBILI 0.5  --  0.8  ALKPHOS 104 105 130*  AST 115* 110* 134*  ALT 63* 60* 82*  PROT 6.7 6.5 7.4  ALBUMIN 2.3* 2.0* 2.2*    Recent Labs  02/25/13 0615  LIPASE 49   PT/INR No results found for this basename: LABPROT, INR,  in the last 72 hours    Imaging Studies: Ct Abdomen Pelvis W  Wo Contrast  02/25/2013   CLINICAL DATA:  Postop cholecystectomy. Recent ERCP.  EXAM: CT ABDOMEN AND PELVIS WITHOUT AND WITH CONTRAST  TECHNIQUE: Multidetector CT imaging of the abdomen and pelvis was performed without contrast material in one or both body regions, followed by contrast material(s) and further sections in one or both body regions.  CONTRAST:  80mL OMNIPAQUE IOHEXOL 300 MG/ML SOLN, 50mL OMNIPAQUE IOHEXOL 300 MG/ML SOLN  COMPARISON:  ERCP 02/24/2013. CT 02/20/2013.  FINDINGS: Small right pleural effusion. Atelectasis or consolidation in the right lower lobe. No focal opacity in the left base. Coronary artery calcifications are noted. Oral contrast material is noted within the esophagus which could be related to reflux or dysmotility.  Prior cholecystectomy. Surgical drain is present in the right upper quadrant period postsurgical stranding and a few locules of gas in the right upper quadrant. Pockets of gas within the right lateral abdominal wall. These findings are presumably due to recent surgery. Diffuse fatty infiltration of the liver. No biliary ductal dilatation. Spleen, pancreas, adrenals and stomach are unremarkable. 2.2 cm cyst in the midpole of the right kidney. Kidneys otherwise unremarkable. No hydronephrosis.  Diffuse colonic diverticulosis. No changes of active diverticulitis. Small bowel is decompressed. Aorta and branch vessels are calcified, non aneurysmal. Urinary bladder is unremarkable. No free fluid, free air or adenopathy. No acute bony abnormality.  IMPRESSION: Postoperative changes from  prior cholecystectomy. Postsurgical changes in the right upper quadrant with postsurgical drain in place.  Fatty infiltration of the liver.  Colonic diverticulosis.  Small right pleural effusion.   Electronically Signed   By: Charlett Nose   On: 02/25/2013 11:26   Ct Head W Wo Contrast  02/25/2013   CLINICAL DATA:  Mental status changes.  EXAM: CT HEAD WITHOUT AND WITH CONTRAST  TECHNIQUE:  Contiguous axial images were obtained from the base of the skull through the vertex without and with intravenous contrast  CONTRAST:  80 cc Omnipaque 300 IV.  COMPARISON:  11/30/2010  FINDINGS: Bold anterior left frontal infarct. Chronic microvascular changes throughout the deep white matter. No hemorrhage, hydrocephalus or acute infarction. No enhancing lesions following contrast administration. No acute calvarial abnormality. Visualized paranasal sinuses and mastoids clear. Orbital soft tissues unremarkable.  IMPRESSION: Atrophy, chronic microvascular changes.  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  No acute findings.   Electronically Signed   By: Charlett Nose   On: 02/25/2013 11:18   US Abdomen Complete  02/20/2013   *RADIOLOGY REPORT*  Clinical Data:  Abdominal pain.  Evidence of choledocholithiasis and cholelithiasis on the current CT.  COMPLETE ABDOMINAL ULTRASOUND  Comparison:  Abdomen CT, 02/20/2013  Findings:  Gallbladder:  Multiple small gallstones are noted along with a small amount of sludge.  The wall is not well defined, but does not appear thickened.  There is some low echogenicity in the liver adjacent to the gallbladder that is likely focal fatty sparing.  No pericholecystic fluid.  There is no sonographic Murphy's sign.  Common bile duct:  Common bile duct measures 4.6 mm proximally.  It is not well seen distally.  No duct stone is evident, but a stone in the more distal duct is not excluded on this exam.  Liver:  Liver is echogenic with attenuation of the sound beam, findings consistent with diffuse fatty infiltration.  No liver mass or focal lesion is seen.  As noted above, there is some focal fatty sparing along the gallbladder fossa.  IVC:  Appears normal.  Pancreas:  Pancreas is poorly visualized and cannot be accurately assessed.  Of note, it was normal on CT.  Spleen:  Normal in size echogenicity measuring 7.7 cm.  Right Kidney:  Right kidney demonstrates a 2.4 cm mid pole cyst, but is  otherwise unremarkable measuring 10.8 cm in length.  Left Kidney:  Normal measuring 11.3 cm in length.  Abdominal aorta:  No aneurysm identified.  IMPRESSION: Cholelithiasis, but no ultrasound evidence of acute cholecystitis.  No bile duct dilation or bile duct stone is seen.  The distal bile duct was not visualized.  Incomplete visualization of the pancreas.  Extensive fatty infiltration of the liver with some focal fatty sparing along the gallbladder fossa.  Right mid pole renal cyst.   Original Report Authenticated By: Amie Portland, M.D.   Ct Abdomen Pelvis W Contrast  02/20/2013   *RADIOLOGY REPORT*  Clinical Data: Abdominal pain.  CT ABDOMEN AND PELVIS WITH CONTRAST  Technique:  Multidetector CT imaging of the abdomen and pelvis was performed following the standard protocol during bolus administration of intravenous contrast.  Contrast: 50mL OMNIPAQUE IOHEXOL 300 MG/ML  SOLN, OMNIPAQUE IOHEXOL 300 MG/ML  SOLN  Comparison: None.  Findings:  BODY WALL: Unremarkable.  LOWER CHEST:  Mediastinum: Coronary artery atherosclerosis.  Small sliding-type hiatal hernia.  Lungs/pleura: No consolidation.  ABDOMEN/PELVIS:  Liver: Diffuse low attenuation consistent with steatosis.  Biliary: Layering high-density material, including in the neck  or cystic duct of the gallbladder, consistent with stones.  No definite pericholecystic inflammation, although patient motion limits assessment of the gallbladder wall.  Pancreas: Unremarkable.  Spleen: Unremarkable.  Adrenals: 1 cm myelolipoma in the left adrenal gland.  Kidneys and ureters: 2 cm cyst in the interpolar right kidney. Another low attenuation focus in the posterior hilar lip on the right is too small to characterize, although also likely a cyst.  Bladder: Unremarkable.  Bowel: No obstruction. Negative for appendicitis.  Colonic diverticulosis without diverticulitis.  Sliding-type hiatal hernia as above.  Retroperitoneum: Minimal haziness of the small bowel  mesentery.  Peritoneum: No free fluid or gas.  Reproductive: Unremarkable.  Vascular: No acute abnormality.  OSSEOUS: No acute abnormalities. Advanced lower lumbar degenerative disc and facet disease, with facet overgrowth causing notable spinal canal stenosis at L4-5.  L5-S1 degenerative disc disease, advanced, with foraminal stenoses.  IMPRESSION: 1.Cholelithiasis with stones in the gallbladder neck, and possibly the cystic duct.  2.  Incidental findings include coronary artery atherosclerosis, colonic diverticulosis, and hepatic steatosis.   Original Report Authenticated By: Tiburcio Pea      Assessment: 77 year old male admitted with gangrenous cholecystitis, s/p cholecystectomy 8/18, with increase in LFTs and concern for CBD stone. ERCP attempted but incomplete despite significant effort and multiple attempts at bile duct cannulation. CT did not show any biliary dilation. Bilirubin coming down. Mild bump in AP, AST, ALT. Clinically patient improving. Patient tolerating diet.  Plan: 1. Would recheck LFTs prior to discharge.   LOS: 6 days   Tana Coast  02/27/2013, 7:51 AM

## 2013-02-27 NOTE — Progress Notes (Signed)
[facility] PROGRESS NOTE  [name] ZOX:[id number] DOB: 04/26/1936 DOA: 02/21/2013 PCP: Leo Grosser, MD  Assessment/Plan: 1. Acute cholecystitis, gangrenous gallbladder: Remains afebrile. Status post cholecystectomy 8/18. Management per surgery. Per Dr. Lovell Sheehan, will treat with IV antibiotics total 5 days then switch to oral. 2. Elevated liver function tests: Obstructive pattern, possible choledocholithiasis. ERCP was nondiagnostic. Overall clinically improving. 3. Acute renal failure: Appears to be peaking. Renal ultrasound unremarkable. There is a component of urinary retention. Multifactorial, likely prerenal syndrome, ATN, IV contrast. Lasix per nephrology. Nephrology consultation appreciated. 4. Acute encephalopathy: Consistent with delirium secondary to acute illness, lucidity waxes and wanes. No focal neurologic deficits. 5. Urinary retention: Likely perioperative in nature. Probably urine output. 6. Hypertension: Stable. 7. Possible early dementia: No history of memory problems per family. Some agitation noted during hospital stay. Ativan as needed. 8. Diabetes mellitus: Stable. Continue long-acting insulin, sliding scale insulin. 9. Hypokalemia: Replete. 10. Hyponatremia: Resolved. Likely secondary to dehydration.   Replete potassium.  Strict urine output measurement. Continue management of renal failure per nephrology.  Blinds open today. Mobilize patient.  Prefer peripheral IV given overall clinical improvement. Discussed with PICC nurse and Dr. Lovell Sheehan.  Overall appears to be clinically improving. Hopefully home in 48 hours if kidney function continues to improve.  Pending studies:   None  Code Status: Full code DVT prophylaxis: Lovenox Family Communication: Discussed with wife at bedside Disposition Plan: Likely home when improved  Brendia Sacks, MD  [facility]  Pager [phone] If 7PM-7AM, please contact night-coverage at [email or web address],  password [facility] 02/27/2013, 9:37 AM  LOS: 6 days   Summary: 77 year old man who presented with right upper quadrant pain. He was admitted for suspected acute cholecystitis and seen in consultation with Gen. surgery. He underwent cholecystectomy laparoscopically which revealed a gangrenous gallbladder. Postoperatively he developed elevation of liver function tests and choledocholithiasis was suspected and GI consultation obtained. ERCP was nondiagnostic secondary to technical difficulties. Hospitalizations been complicated by acute renal failure and encephalopathy.  Consultants:  Gen surgery, Dr. Lovell Sheehan Gastroenterology Procedures:  Laparoscopic cholecystectomy on 8/18 ERCP 8/19: Normal-appearing ampulla and normal-appearing pancreatic duct. Unable to achieve bile duct cannulation   Antibiotics:  Zosyn 8/18 >> 8/22  HPI/Subjective: Confused again overnight, lost IV. Better today. Denies pain. No nausea or vomiting.  Objective: Filed Vitals:   02/26/13 1130 02/26/13 1400 02/26/13 2203 02/27/13 0609  BP:  158/96 135/71 115/68  Pulse:   86 69  Temp: 98.9 F (37.2 C) 98.6 F (37 C) 97.6 F (36.4 C) 98.2 F (36.8 C)  TempSrc: Oral Oral Oral Oral  Resp:   20 18  Height:      Weight:      SpO2:  96%  99%    Intake/Output Summary (Last 24 hours) at 02/27/13 0937 Last data filed at 02/27/13 8295  Gross per 24 hour  Intake   1730 ml  Output    950 ml  Net    780 ml     Filed Weights   02/24/13 0500 02/25/13 0500 02/26/13 0500  Weight: 92.1 kg (203 lb 0.7 oz) 96.9 kg (213 lb 10 oz) 99.3 kg (218 lb 14.7 oz)    Exam:   Afebrile greater than 96 hours. Vital signs stable.  General: Appears calm, comfortable.  Psychiatric: Mildly confused. Follows commands. Recognizes his wife and family member. Oriented to self, president. Disoriented to time, place.  Neurologic: Cranial nerves 2-12 appear intact. Grossly normal tone and strength bilateral upper and lower extremities. Follows  commands well.  Cardiovascular: Regular rate and rhythm. No murmur, rub, gallop. No lower extremity edema.  Respiratory: Clear to auscultation bilaterally. No wheezes, rales, rhonchi. Normal respiratory effort.  Abdomen: Soft, nontender.   Data Reviewed:  Urine output not charted.  Capillary blood sugars stable  Creatinine without significant change. 2.05.  Potassium 2.9.  CBC, stable hemoglobin. Mild elevation of white blood cell count.  Urine culture no growth, final.   Scheduled Meds: . antiseptic oral rinse  15 mL Mouth Rinse BID  . enoxaparin (LOVENOX) injection  40 mg Subcutaneous Q24H  . insulin aspart  0-15 Units Subcutaneous TID WC  . insulin aspart  0-5 Units Subcutaneous QHS  . insulin detemir  10 Units Subcutaneous QHS  . piperacillin-tazobactam (ZOSYN)  IV  3.375 g Intravenous Q8H  . polyethylene glycol  17 g Oral BID  . potassium chloride  10 mEq Intravenous Q1 Hr x 3   Continuous Infusions: . 0.9 % NaCl with KCl 40 mEq / L      Principal Problem:   Cholecystitis, acute Active Problems:   Hypertension   Cholelithiasis   DM (diabetes mellitus)   Choledocholithiasis   Acute renal failure   Time spent 20 minutes

## 2013-02-27 NOTE — Progress Notes (Signed)
Utilization Review Complete  

## 2013-02-27 NOTE — Evaluation (Signed)
Physical Therapy Evaluation Patient Details Name: [name] MRN: [id number] DOB: 1935-07-10 Today's Date: 02/27/2013 Time: 1119-1205 PT Time Calculation (min): 46 min  PT Assessment / Plan / Recommendation History of Present Illness  Pt is admitted with gangrenous gall bladder and had a cholecystectomy on 02-23-13.  He currently has a drain in place.  He had been independent at home PTA however wife states that pt had been having difficutly with poor balance recently.  Clinical Impression   Pt was seen for evaluation which was made exceedingly difficult due to his agitation and altered mental status.  Pt was initially resistant to any interaction with me and it was only after much persistence that I was able to finally evaluate his mobility.  His gait is ataxic and he needs maximal assistance to ambulate with a walker.  He is unable to focus on task.  I am concerned that his family will not be able to manage him at home if this altered mental status persists and am therefore recommending SNF at this time.      PT Assessment  Patient needs continued PT services    Follow Up Recommendations  SNF    Does the patient have the potential to tolerate intense rehabilitation    no  Barriers to Discharge   home has 2 steps at entry...handrail is available    Equipment Recommendations  Rolling walker with 5" wheels    Recommendations for Other Services     Frequency Min 3X/week    Precautions / Restrictions Precautions Precautions: Fall   Pertinent Vitals/Pain       Mobility  Bed Mobility Bed Mobility: Supine to Sit Supine to Sit: 2: Max assist Details for Bed Mobility Assistance: pt somewhat resistant in trying to sit at EOB and would not assist..[name] KitchenMy presumption is that he could do this independently if he were more cognitively coherent Transfers Transfers: Sit to Stand;Stand to Sit Sit to Stand: 4: Min assist;With upper extremity assist;From toilet;From bed Stand to Sit: 4:  Min assist;To toilet;With upper extremity assist;To chair/3-in-1 Ambulation/Gait Ambulation/Gait Assistance: 2: Max assist Ambulation Distance (Feet): 80 Feet Assistive device: Rolling walker;None Ambulation/Gait Assistance Details: gait was noted to be very unstable with no assistive device, so a walker was given to the pt and he was asked to lean on it while walking...this did stabilize his gait, but he was so distractable that he kept losing his balance while trying to look into pt rooms. Gait Pattern: Ataxic Gait velocity: slow Stairs: No Wheelchair Mobility Wheelchair Mobility: No    Exercises     PT Diagnosis: Difficulty walking;Abnormality of gait;Acute pain  PT Problem List: Decreased activity tolerance;Decreased balance;Decreased mobility;Decreased cognition;Decreased knowledge of use of DME;Decreased safety awareness;Pain PT Treatment Interventions: Gait training;Functional mobility training     PT Goals(Current goals can be found in the care plan section) Acute Rehab PT Goals Patient Stated Goal: wife hopes to bring pt home at d/c PT Goal Formulation: With family Time For Goal Achievement: 03/13/13 Potential to Achieve Goals: Fair  Visit Information  Last PT Received On: 02/27/13 History of Present Illness: Pt is admitted with gangrenous gall bladder and had a cholecystectomy on 02-23-13.  He currently has a drain in place.  He had been independent at home PTA however wife states that pt had been having difficutly with poor balance recently.       Prior Functioning  Home Living Family/patient expects to be discharged to:: Skilled nursing facility Prior Function Level of Independence: Independent  Communication Communication: No difficulties    Cognition  Cognition Arousal/Alertness: Awake/alert Behavior During Therapy: Agitated Overall Cognitive Status: Impaired/Different from baseline Area of Impairment: Orientation;Attention;Following commands Orientation  Level: Disoriented to;Place;Time;Situation Current Attention Level: Divided Following Commands: Follows one step commands inconsistently    Extremity/Trunk Assessment Lower Extremity Assessment Lower Extremity Assessment: Overall WFL for tasks assessed   Balance Balance Balance Assessed: Yes Static Sitting Balance Static Sitting - Balance Support: No upper extremity supported;Feet supported Static Sitting - Level of Assistance: 5: Stand by assistance  End of Session PT - End of Session Equipment Utilized During Treatment: Gait belt Activity Tolerance: Treatment limited secondary to agitation Patient left: in chair;with call bell/phone within reach;with chair alarm set;with family/visitor present Nurse Communication: Mobility status  GP     [name] 02/27/2013, 12:11 PM

## 2013-02-27 NOTE — Progress Notes (Signed)
3 Days Post-Op  Subjective: Patient still intermittently confused, but is reoriented when I talked to him.  Objective: Vital signs in last 24 hours: Temp:  [97.6 F (36.4 C)-98.9 F (37.2 C)] 98.2 F (36.8 C) (08/22 0609) Pulse Rate:  [69-86] 69 (08/22 0609) Resp:  [18-20] 18 (08/22 0609) BP: (115-158)/(68-96) 115/68 mmHg (08/22 0609) SpO2:  [96 %-99 %] 99 % (08/22 0609) Last BM Date: 02/26/13  Intake/Output from previous day: 08/21 0701 - 08/22 0700 In: 1930 [P.O.:480; I.V.:1350; IV Piggyback:100] Out: 950 [Drains:950] Intake/Output this shift: Total I/O In: 360 [P.O.:360] Out: -   General appearance: appears stated age and no distress GI: Soft. Incisions healing well. JP drainage serosanguineous. No significant bile present.  Lab Results:   Recent Labs  02/26/13 0517 02/27/13 0529  WBC 9.8 12.1*  HGB 9.7* 10.3*  HCT 29.1* 30.4*  PLT 218 279   BMET  Recent Labs  02/26/13 0517 02/27/13 0529  NA 146* 143  K 3.3* 2.9*  CL 110 103  CO2 25 29  GLUCOSE 120* 209*  BUN 23 26*  CREATININE 2.06* 2.05*  CALCIUM 8.4 8.8   PT/INR No results found for this basename: LABPROT, INR,  in the last 72 hours  Studies/Results: Ct Abdomen Pelvis W Wo Contrast  02/25/2013   CLINICAL DATA:  Postop cholecystectomy. Recent ERCP.  EXAM: CT ABDOMEN AND PELVIS WITHOUT AND WITH CONTRAST  TECHNIQUE: Multidetector CT imaging of the abdomen and pelvis was performed without contrast material in one or both body regions, followed by contrast material(s) and further sections in one or both body regions.  CONTRAST:  80mL OMNIPAQUE IOHEXOL 300 MG/ML SOLN, 50mL OMNIPAQUE IOHEXOL 300 MG/ML SOLN  COMPARISON:  ERCP 02/24/2013. CT 02/20/2013.  FINDINGS: Small right pleural effusion. Atelectasis or consolidation in the right lower lobe. No focal opacity in the left base. Coronary artery calcifications are noted. Oral contrast material is noted within the esophagus which could be related to reflux or  dysmotility.  Prior cholecystectomy. Surgical drain is present in the right upper quadrant period postsurgical stranding and a few locules of gas in the right upper quadrant. Pockets of gas within the right lateral abdominal wall. These findings are presumably due to recent surgery. Diffuse fatty infiltration of the liver. No biliary ductal dilatation. Spleen, pancreas, adrenals and stomach are unremarkable. 2.2 cm cyst in the midpole of the right kidney. Kidneys otherwise unremarkable. No hydronephrosis.  Diffuse colonic diverticulosis. No changes of active diverticulitis. Small bowel is decompressed. Aorta and branch vessels are calcified, non aneurysmal. Urinary bladder is unremarkable. No free fluid, free air or adenopathy. No acute bony abnormality.  IMPRESSION: Postoperative changes from prior cholecystectomy. Postsurgical changes in the right upper quadrant with postsurgical drain in place.  Fatty infiltration of the liver.  Colonic diverticulosis.  Small right pleural effusion.   Electronically Signed   By: Charlett Nose   On: 02/25/2013 11:26   Ct Head W Wo Contrast  02/25/2013   CLINICAL DATA:  Mental status changes.  EXAM: CT HEAD WITHOUT AND WITH CONTRAST  TECHNIQUE: Contiguous axial images were obtained from the base of the skull through the vertex without and with intravenous contrast  CONTRAST:  80 cc Omnipaque 300 IV.  COMPARISON:  11/30/2010  FINDINGS: Bold anterior left frontal infarct. Chronic microvascular changes throughout the deep white matter. No hemorrhage, hydrocephalus or acute infarction. No enhancing lesions following contrast administration. No acute calvarial abnormality. Visualized paranasal sinuses and mastoids clear. Orbital soft tissues unremarkable.  IMPRESSION: Atrophy,  chronic microvascular changes.  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  No acute findings.   Electronically Signed   By: Charlett Nose   On: 02/25/2013 11:18   US Renal  02/25/2013   CLINICAL DATA:  Acute renal  failure.  EXAM: RENAL/URINARY TRACT ULTRASOUND COMPLETE  COMPARISON:  CT 02/25/2013  FINDINGS: Right Kidney: 10.7 cm. Normal echotexture. No visible focal abnormality. The previously seen cyst by CT cannot be appreciated on ultrasound.  Left Kidney: 12.4 cm. Normal size and echotexture. No focal abnormality. No hydronephrosis.  Bladder:  Normal appearance.  IMPRESSION: Unremarkable study.  No hydronephrosis.   Electronically Signed   By: Charlett Nose   On: 02/25/2013 13:21    Anti-infectives: Anti-infectives   Start     Dose/Rate Route Frequency Ordered Stop   02/26/13 0000  piperacillin-tazobactam (ZOSYN) IVPB 3.375 g     3.375 g 12.5 mL/hr over 240 Minutes Intravenous Every 8 hours 02/25/13 1931     02/23/13 1800  piperacillin-tazobactam (ZOSYN) IVPB 3.375 g  Status:  Discontinued     3.375 g 12.5 mL/hr over 240 Minutes Intravenous Every 8 hours 02/23/13 1632 02/25/13 1920   02/23/13 1315  ceFAZolin (ANCEF) IVPB 2 g/50 mL premix     2 g 100 mL/hr over 30 Minutes Intravenous  Once 02/23/13 1311 02/23/13 1340   02/21/13 1730  [MAR Hold]  ceFAZolin (ANCEF) IVPB 1 g/50 mL premix  Status:  Discontinued     (On MAR Hold since 02/23/13 1047)   1 g 100 mL/hr over 30 Minutes Intravenous 3 times per day 02/21/13 1715 02/23/13 1315      Assessment/Plan: s/p Procedure(s): ENDOSCOPIC RETROGRADE CHOLANGIOPANCREATOGRAPHY Impression: Postoperative day 4, status post laparoscopic cholecystectomy for gangrenous cholecystitis. His bilirubin is improving. I suspect his delirium has been more sundowning in nature. Nothing further to add from surgical standpoint. Dr. Leticia Penna will be covering me this weekend. His Jackson-Pratt drain can be removed if he is discharged.  LOS: 6 days    [name] A 02/27/2013

## 2013-02-28 DIAGNOSIS — G9340 Encephalopathy, unspecified: Secondary | ICD-10-CM

## 2013-02-28 LAB — CBC
Platelets: 324 10*3/uL (ref 150–400)
RBC: 3.24 MIL/uL — ABNORMAL LOW (ref 4.22–5.81)
RDW: 12.9 % (ref 11.5–15.5)
WBC: 11.3 10*3/uL — ABNORMAL HIGH (ref 4.0–10.5)

## 2013-02-28 LAB — MAGNESIUM: Magnesium: 1.8 mg/dL (ref 1.5–2.5)

## 2013-02-28 LAB — BASIC METABOLIC PANEL
Calcium: 8.2 mg/dL — ABNORMAL LOW (ref 8.4–10.5)
Chloride: 105 mEq/L (ref 96–112)
Creatinine, Ser: 1.81 mg/dL — ABNORMAL HIGH (ref 0.50–1.35)
GFR calc Af Amer: 41 mL/min — ABNORMAL LOW (ref >90)
Sodium: 141 mEq/L (ref 135–145)

## 2013-02-28 LAB — GLUCOSE, CAPILLARY
Glucose-Capillary: 143 mg/dL — ABNORMAL HIGH (ref 70–99)
Glucose-Capillary: 126 mg/dL — ABNORMAL HIGH (ref 70–99)
Glucose-Capillary: 202 mg/dL — ABNORMAL HIGH (ref 70–99)

## 2013-02-28 MED ORDER — INSULIN DETEMIR 100 UNIT/ML ~~LOC~~ SOLN
12.0000 [IU] | Freq: Every day | SUBCUTANEOUS | Status: DC
  Administered 2013-02-28: 12 [IU] via SUBCUTANEOUS
  Filled 2013-02-28 (×2): qty 0.12

## 2013-02-28 MED ORDER — INSULIN ASPART 100 UNIT/ML ~~LOC~~ SOLN
3.0000 [IU] | Freq: Three times a day (TID) | SUBCUTANEOUS | Status: DC
  Administered 2013-02-28 – 2013-03-01 (×4): 3 [IU] via SUBCUTANEOUS

## 2013-02-28 NOTE — Plan of Care (Signed)
Mr [name] informed about no IV (pt pulled out both on 3rd shift).  He said ok to leave out IV for now.

## 2013-02-28 NOTE — Plan of Care (Signed)
Problem: Phase I Progression Outcomes Goal: Sutures/staples intact Educated family what incisions and staples were supposed to look like and how they would know of any infections at the sites

## 2013-02-28 NOTE — Progress Notes (Signed)
Subjective: Patient without complaints. Tolerating regular food.  Objective: Vital signs in last 24 hours: Temp:  [97.7 F (36.5 C)-99 F (37.2 C)] 98.7 F (37.1 C) (08/23 0509) Pulse Rate:  [78-90] 83 (08/23 0509) Resp:  [18-20] 20 (08/23 0509) BP: (140-162)/(61-104) 141/61 mmHg (08/23 0509) SpO2:  [94 %-99 %] 97 % (08/23 0509) Last BM Date: 02/27/13 General:   Alert,  conversant. More oriented than when I last saw him a couple of days ago. He is accompanied by multiple family members. Abdomen:  Nondistended.  Normal bowel sounds, Minimal right upper quadrant tenderness. without guarding, and without rebound.  No mass or organomegaly. Nil fluid in JP drain. Extremities:  Without clubbing or edema.    Intake/Output from previous day: 08/22 0701 - 08/23 0700 In: 1080 [P.O.:1080] Out: 1076 [Urine:700; Drains:375; Stool:1] Intake/Output this shift:    Lab Results:  Recent Labs  02/26/13 0517 02/27/13 0529 02/28/13 0531  WBC 9.8 12.1* 11.3*  HGB 9.7* 10.3* 10.0*  HCT 29.1* 30.4* 28.9*  PLT 218 279 324   BMET  Recent Labs  02/26/13 0517 02/27/13 0529 02/28/13 0531  NA 146* 143 141  K 3.3* 2.9* 3.1*  CL 110 103 105  CO2 25 29 25   GLUCOSE 120* 209* 90  BUN 23 26* 26*  CREATININE 2.06* 2.05* 1.81*  CALCIUM 8.4 8.8 8.2*   LFT  Recent Labs  02/27/13 0529  PROT 7.4  ALBUMIN 2.2*  AST 134*  ALT 82*  ALKPHOS 130*  BILITOT 2.5*  BILIDIR 1.7*  IBILI 0.8     Impression:  Patient has exhibited steady improvement since cholecystectomy. His mental status appears to be also improved to me this morning.  Recommendations:  At a minimum, would consider repeating his LFTs the first of next week or prior to discharge.

## 2013-02-28 NOTE — Progress Notes (Signed)
4 Days Post-Op  Subjective: Pain mostly controlled.  Poor appetite but no nausea.  Ambulating.    Objective: Vital signs in last 24 hours: Temp:  [98.1 F (36.7 Newman)-99 F (37.2 Newman)] 98.1 F (36.7 Newman) (08/23 1500) Pulse Rate:  [78-86] 86 (08/23 1500) Resp:  [20] 20 (08/23 1500) BP: (133-141)/(61-83) 133/71 mmHg (08/23 1500) SpO2:  [94 %-97 %] 97 % (08/23 1500) Last BM Date: 02/27/13  Intake/Output from previous day: 08/22 0701 - 08/23 0700 In: 1080 [P.O.:1080] Out: 1076 [Urine:700; Drains:375; Stool:1] Intake/Output this shift:    General appearance: alert and no distress GI: +BS, soft, anticipated tenderness.  Incision Newman/d/i.  Lab Results:   Recent Labs  02/27/13 0529 02/28/13 0531  WBC 12.1* 11.3*  HGB 10.3* 10.0*  HCT 30.4* 28.9*  PLT 279 324   BMET  Recent Labs  02/27/13 0529 02/28/13 0531  NA 143 141  K 2.9* 3.1*  CL 103 105  CO2 29 25  GLUCOSE 209* 90  BUN 26* 26*  CREATININE 2.05* 1.81*  CALCIUM 8.8 8.2*   PT/INR No results found for this basename: LABPROT, INR,  in the last 72 hours ABG No results found for this basename: PHART, PCO2, PO2, HCO3,  in the last 72 hours  Studies/Results: No results found.  Anti-infectives: Anti-infectives   Start     Dose/Rate Route Frequency Ordered Stop   02/26/13 0000  piperacillin-tazobactam (ZOSYN) IVPB 3.375 g  Status:  Discontinued     3.375 g 12.5 mL/hr over 240 Minutes Intravenous Every 8 hours 02/25/13 1931 02/28/13 1015   02/23/13 1800  piperacillin-tazobactam (ZOSYN) IVPB 3.375 g  Status:  Discontinued     3.375 g 12.5 mL/hr over 240 Minutes Intravenous Every 8 hours 02/23/13 1632 02/25/13 1920   02/23/13 1315  ceFAZolin (ANCEF) IVPB 2 g/50 mL premix     2 g 100 mL/hr over 30 Minutes Intravenous  Once 02/23/13 1311 02/23/13 1340   02/21/13 1730  [MAR Hold]  ceFAZolin (ANCEF) IVPB 1 g/50 mL premix  Status:  Discontinued     (On MAR Hold since 02/23/13 1047)   1 g 100 mL/hr over 30 Minutes  Intravenous 3 times per day 02/21/13 1715 02/23/13 1315      Assessment/Plan: s/p Procedure(s): ENDOSCOPIC RETROGRADE CHOLANGIOPANCREATOGRAPHY (N/A) Increase activity and diet.  If bili improved tomorrow, ok from surgical standpoint to consider discharge in next 24-48 hrs.    LOS: 7 days    [name] 02/28/2013

## 2013-02-28 NOTE — Progress Notes (Signed)
Subjective: Interval History: not  Fee;ing good. No nausea pr vomiting   Objective: Vital signs in last 24 hours: Temp:  [97.7 F (36.5 C)-99 F (37.2 C)] 98.7 F (37.1 C) (08/23 0509) Pulse Rate:  [78-90] 83 (08/23 0509) Resp:  [18-20] 20 (08/23 0509) BP: (140-162)/(61-104) 141/61 mmHg (08/23 0509) SpO2:  [94 %-99 %] 97 % (08/23 0509) Weight change:   Intake/Output from previous day: 08/22 0701 - 08/23 0700 In: 1080 [P.O.:1080] Out: 1076 [Urine:700; Drains:375; Stool:1] Intake/Output this shift:    The patient seems to be very confused and restless . Patient alert possibly better today Chest is clear to auscultation no rhonchi or egophony. Heart exam regular rate and rhythm Abdomen  possetive bowl sound Extremities no edema.   Lab Results:  Recent Labs  02/27/13 0529 02/28/13 0531  WBC 12.1* 11.3*  HGB 10.3* 10.0*  HCT 30.4* 28.9*  PLT 279 324   BMET:   Recent Labs  02/27/13 0529 02/28/13 0531  NA 143 141  K 2.9* 3.1*  CL 103 105  CO2 29 25  GLUCOSE 209* 90  BUN 26* 26*  CREATININE 2.05* 1.81*  CALCIUM 8.8 8.2*   No results found for this basename: PTH,  in the last 72 hours Iron Studies:   Recent Labs  02/27/13 0529  IRON 63  TIBC 154*  FERRITIN 6217*    Studies/Results: No results found.  I have reviewed the patient's current medications.  Assessment/Plan: Problem #1 acute kidney injury presently seems to be multifactorial including prerenal syndrome/ATN/blind used acute kidney injury. Presently his BUN and creatinine is progressivly improving Problem #2 chronic renal failure. Ultrasound left kidney 12.4 and right kidney 10.7 hence patient seems to have an equal kidney. There is no hydronephrosis. The etiology for this and kidney is not clear. Problem #3 hypertension his blood pressure seems to be reasonably controlled Problem #4 diabetes Problem #5 history of cholecystitis status post cholecystectomy. Problem #6 anemia etiology as this  moment is not clear H&H is low but stable Problem #7 metabolic bone disease calcium is with  in acceptable range and phospherous is normal Problem #8 hypokalemia  Most likely due to lasix .Presently on kcl and potassium is improving Plan:1] We'll check his basic metabolic panel and CBC  in am         2]Continue with present treatment       LOS: 7 days   Jordell Outten S 02/28/2013,7:12 AM

## 2013-02-28 NOTE — Progress Notes (Signed)
[facility] PROGRESS NOTE  [name] EAV:[id number] DOB: 1935-09-27 DOA: 02/21/2013 PCP: Leo Grosser, MD  Assessment/Plan: 1. Acute cholecystitis, gangrenous gallbladder: Remains afebrile. Status post cholecystectomy 8/18. Management per surgery. Discontinue antibiotics. 2. Elevated liver function tests: Obstructive pattern, possible choledocholithiasis. ERCP was nondiagnostic. Repeat CMP in the morning. 3. Acute renal failure: Improving. Renal ultrasound unremarkable. There was a component of urinary retention. Multifactorial, likely prerenal syndrome, ATN, IV contrast. Lasix per nephrology. Nephrology consultation appreciated. 4. Acute encephalopathy: Consistent with delirium secondary to acute illness, lucidity waxes and wanes. No focal neurologic deficits. Possibly secondary to Zosyn, will discontinue. Electrolytes unremarkable. Remove Foley which may help as well. Expect spontaneous improvement will continue at home. 5. Urinary retention: Likely perioperative in nature.  6. Hypertension: Stable. 7. Diabetes mellitus: Somewhat labile. Continue long-acting insulin, sliding scale insulin. 8. Hypokalemia: Replete. Magnesium normal. 9. Hyponatremia: Resolved. Likely secondary to dehydration.   Replete potassium.  CMP in the morning.  Stop Zosyn  Increase long-acting insulin  Remove Foley catheter, voiding trial.  Okay to leave IV out for now given overall clinical improvement. Encourage oral intake. Hopefully home within the next 48 hours.  Pending studies:   None  Code Status: Full code DVT prophylaxis: Lovenox Family Communication: Discussed with wife and daughter at bedside Disposition Plan: Likely home when improved  Brendia Sacks, MD  [facility]  Pager [phone] If 7PM-7AM, please contact night-coverage at [email or web address], password [facility] 02/28/2013, 10:11 AM  LOS: 7 days   Summary: 77 year old man who presented with right upper quadrant pain. He  was admitted for suspected acute cholecystitis and seen in consultation with Gen. surgery. He underwent cholecystectomy laparoscopically which revealed a gangrenous gallbladder. Postoperatively he developed elevation of liver function tests and choledocholithiasis was suspected and GI consultation obtained. ERCP was nondiagnostic secondary to technical difficulties. Hospitalizations been complicated by acute renal failure and encephalopathy.  Consultants:  Gen surgery, Dr. Lovell Sheehan Gastroenterology Procedures:  Laparoscopic cholecystectomy on 8/18 ERCP 8/19: Normal-appearing ampulla and normal-appearing pancreatic duct. Unable to achieve bile duct cannulation   Antibiotics:  Zosyn 8/18 >> 8/22  HPI/Subjective: Patient removed both IVs. Still confused, waxing and waning. He denies pain. Family at bedside. Eating fairly well. Good urine output.  Objective: Filed Vitals:   02/27/13 0609 02/27/13 1259 02/27/13 2229 02/28/13 0509  BP: 115/68 162/104 140/83 141/61  Pulse: 69 90 78 83  Temp: 98.2 F (36.8 C) 97.7 F (36.5 C) 99 F (37.2 C) 98.7 F (37.1 C)  TempSrc: Oral Oral Oral Axillary  Resp: 18 18 20 20   Height:      Weight:      SpO2: 99% 99% 94% 97%    Intake/Output Summary (Last 24 hours) at 02/28/13 1011 Last data filed at 02/28/13 0509  Gross per 24 hour  Intake    720 ml  Output   1076 ml  Net   -356 ml     Filed Weights   02/24/13 0500 02/25/13 0500 02/26/13 0500  Weight: 92.1 kg (203 lb 0.7 oz) 96.9 kg (213 lb 10 oz) 99.3 kg (218 lb 14.7 oz)    Exam:   Remains afebrile. Vital signs stable. No hypoxia.  General: Appears calm and comfortable. Speech fluent and clear. Somewhat inappropriate.  Psychiatric: Mood and affect grossly normal. Oriented to self, family members. Not location month or year.  Cardiovascular: Regular rate and rhythm. No murmur, rub, gallop. No lower extremity edema.  Respiratory: Clear to auscultation bilaterally. No wheezes, rales,  rhonchi. Normal respiratory effort.  Abdomen: Soft, nontender, nondistended.  Skin: No rash seen.  Data Reviewed:  Creatinine improving. Potassium 3.1. Magnesium normal.  Minimal leukocytosis. Hemoglobin stable.   Scheduled Meds: . antiseptic oral rinse  15 mL Mouth Rinse BID  . enoxaparin (LOVENOX) injection  40 mg Subcutaneous Q24H  . insulin aspart  0-15 Units Subcutaneous TID WC  . insulin aspart  0-5 Units Subcutaneous QHS  . insulin detemir  10 Units Subcutaneous QHS  . piperacillin-tazobactam (ZOSYN)  IV  3.375 g Intravenous Q8H  . polyethylene glycol  17 g Oral BID   Continuous Infusions: . 0.9 % NaCl with KCl 40 mEq / L 50 mL/hr at 02/27/13 1357    Principal Problem:   Cholecystitis, acute Active Problems:   Hypertension   Cholelithiasis   DM (diabetes mellitus)   Choledocholithiasis   Acute renal failure   Acute encephalopathy   Urinary retention   Time spent 15 minutes

## 2013-03-01 LAB — COMPREHENSIVE METABOLIC PANEL
AST: 75 U/L — ABNORMAL HIGH (ref 0–37)
Albumin: 2.3 g/dL — ABNORMAL LOW (ref 3.5–5.2)
BUN: 22 mg/dL (ref 6–23)
Chloride: 102 mEq/L (ref 96–112)
Creatinine, Ser: 1.48 mg/dL — ABNORMAL HIGH (ref 0.50–1.35)
Total Protein: 7.1 g/dL (ref 6.0–8.3)

## 2013-03-01 LAB — GLUCOSE, CAPILLARY: Glucose-Capillary: 113 mg/dL — ABNORMAL HIGH (ref 70–99)

## 2013-03-01 MED ORDER — POTASSIUM CHLORIDE CRYS ER 20 MEQ PO TBCR
40.0000 meq | EXTENDED_RELEASE_TABLET | Freq: Once | ORAL | Status: AC
  Administered 2013-03-01: 40 meq via ORAL
  Filled 2013-03-01: qty 2

## 2013-03-01 MED ORDER — POTASSIUM CHLORIDE CRYS ER 20 MEQ PO TBCR
30.0000 meq | EXTENDED_RELEASE_TABLET | Freq: Two times a day (BID) | ORAL | Status: DC
  Filled 2013-03-01: qty 1

## 2013-03-01 MED ORDER — HYDROCODONE-ACETAMINOPHEN 5-325 MG PO TABS: 1.0000 | ORAL_TABLET | ORAL | Status: DC | PRN

## 2013-03-01 NOTE — Discharge Planning (Signed)
Contacted Dr. Leticia Penna to confirm that JP drain was to be left in place. Dr. Charline Bills he did want it to remain in place and pt should FU with Lovell Sheehan on Thurs.

## 2013-03-01 NOTE — Progress Notes (Signed)
[facility] PROGRESS NOTE  [name] MVH:[id number] DOB: 17-May-1936 DOA: 02/21/2013 PCP: Leo Grosser, MD  Assessment/Plan: 1. Acute cholecystitis, gangrenous gallbladder: Remains afebrile. Status post cholecystectomy 8/18. Management per surgery.  2. Elevated liver function tests: Continue to improve towards normal. Obstructive pattern, possible choledocholithiasis. ERCP was nondiagnostic.  3. Acute renal failure: Appears resolved. Renal ultrasound unremarkable. Multifactorial, likely prerenal syndrome, ATN, IV contrast, urinary retention. Nephrology consultation appreciated. 4. Acute encephalopathy: Resolving, much improved today. Consistent with delirium secondary to acute illness, lucidity waxes and wanes. No focal neurologic deficits. Possibly secondary to Zosyn. Expect spontaneous improvement will continue at home. 5. Urinary retention: Likely perioperative in nature. Resolved. Voiding well. 6. Hypertension: Stable. 7. Diabetes mellitus: Control improved. Resume oral medications on discharge. 8. Hypokalemia: Replete. Magnesium was normal. 9. Hyponatremia: Resolved. Likely secondary to dehydration.   Replete potassium.  Home today with home health RN, PT, aide, social work  Pending studies:   None if okay with surgery  Code Status: Full code DVT prophylaxis: Lovenox Family Communication: Discussed with family members at bedside Disposition Plan: Likely home when improved  Brendia Sacks, MD  [facility]  Pager [phone] If 7PM-7AM, please contact night-coverage at [email or web address], password [facility] 03/01/2013, 10:23 AM  LOS: 8 days   Summary: 77 year old man who presented with right upper quadrant pain. He was admitted for suspected acute cholecystitis and seen in consultation with Gen. surgery. He underwent cholecystectomy laparoscopically which revealed a gangrenous gallbladder. Postoperatively he developed elevation of liver function tests and  choledocholithiasis was suspected and GI consultation obtained. ERCP was nondiagnostic secondary to technical difficulties. Hospitalizations been complicated by acute renal failure and encephalopathy.  Consultants:  Gen surgery, Dr. Lovell Sheehan Gastroenterology Procedures:  Laparoscopic cholecystectomy on 8/18 ERCP 8/19: Normal-appearing ampulla and normal-appearing pancreatic duct. Unable to achieve bile duct cannulation   Antibiotics:  Zosyn 8/18 >> 8/22  HPI/Subjective: No issues overnight. Much better today. No complaints. No pain. Ate all of his breakfast.  Objective: Filed Vitals:   02/28/13 0509 02/28/13 1500 02/28/13 2119 03/01/13 0552  BP: 141/61 133/71 122/86 109/50  Pulse: 83 86 83 79  Temp: 98.7 F (37.1 C) 98.1 F (36.7 C) 98 F (36.7 C) 98.4 F (36.9 C)  TempSrc: Axillary Oral Oral Oral  Resp: 20 20 20 18   Height:      Weight:      SpO2: 97% 97% 100% 100%    Intake/Output Summary (Last 24 hours) at 03/01/13 1023 Last data filed at 03/01/13 0658  Gross per 24 hour  Intake      0 ml  Output    400 ml  Net   -400 ml     Filed Weights   02/24/13 0500 02/25/13 0500 02/26/13 0500  Weight: 92.1 kg (203 lb 0.7 oz) 96.9 kg (213 lb 10 oz) 99.3 kg (218 lb 14.7 oz)    Exam:   Remains afebrile. Vital signs stable. No hypoxia.  General: Very calm, appropriate today. Appears well.  Cardiovascular: Regular rate and rhythm. No murmur, rub, gallop. No lower extremity edema.  Respiratory: Clear to auscultation bilaterally. No wheezes, rales, rhonchi. Normal respiratory effort.  Abdomen: Soft, nontender, nondistended  Musculoskeletal: Grossly normal tone and strength in all extremities.  Psychiatric: Grossly normal mood and affect. Speech fluent and appropriate. Oriented to self, year. Recognizes family members. Makes good eye contact.  Data Reviewed:  Creatinine continues to improve. Potassium low. Total bilirubin near normal. AST and ALT  improved.   Scheduled Meds: . antiseptic oral rinse  15 mL Mouth Rinse BID  . enoxaparin (LOVENOX) injection  40 mg Subcutaneous Q24H  . insulin aspart  0-15 Units Subcutaneous TID WC  . insulin aspart  0-5 Units Subcutaneous QHS  . insulin aspart  3 Units Subcutaneous TID WC  . insulin detemir  12 Units Subcutaneous QHS  . polyethylene glycol  17 g Oral BID  . potassium chloride  30 mEq Oral BID   Continuous Infusions:    Principal Problem:   Cholecystitis, acute Active Problems:   Hypertension   Cholelithiasis   DM (diabetes mellitus)   Choledocholithiasis   Acute renal failure   Acute encephalopathy   Urinary retention

## 2013-03-01 NOTE — Discharge Summary (Signed)
Physician Discharge Summary  [name] JXB:[id number] DOB: 12/29/1935 DOA: 02/21/2013  PCP: Kenneth Grosser, MD  Admit date: 02/21/2013 Discharge date: 03/01/2013  Recommendations for Outpatient Follow-up:  1. Followup cholecystectomy  2. Followup elevated liver function tests, consider repeat complete metabolic panel the outpatient setting. 3. Followup resolution delirium  4. Metformin has been held secondary to borderline kidney function. Could resume as clinically indicated.  5. Coronary atherosclerosis seen on chest CT, incidental finding. Could consider outpatient evaluation as clinically indicated.  6. Home health RN, PT, aide, social work   Follow-up Information   Follow up with [facility] TOM, MD In 1 week.   Specialty:  Family Medicine   Contact information:   [address] [place] [address] [phone]       Follow up with Dalia Heading, MD In 1 week.   Specialty:  General Surgery   Contact information:   [address] [place] [address] [phone]      Discharge Diagnoses:  1. Acute cholecystitis, gangrenous gallbladder 2. Elevated liver function tests 3. Acute renal failure 4. Acute encephalopathy 5. Urinary retention 6. Diabetes mellitus  Discharge Condition: Improved Disposition: Home  Diet recommendation: Diabetic diet  Filed Weights   02/24/13 0500 02/25/13 0500 02/26/13 0500  Weight: 92.1 kg (203 lb 0.7 oz) 96.9 kg (213 lb 10 oz) 99.3 kg (218 lb 14.7 oz)    History of present illness:  77 year old man who presented with right upper quadrant pain. He was admitted for suspected acute cholecystitis and seen in consultation with Gen. surgery.   Hospital Course:  [name] underwent cholecystectomy laparoscopically which revealed a gangrenous gallbladder. Postoperatively he developed elevation of liver function tests and choledocholithiasis was suspected and GI consultation obtained. ERCP was nondiagnostic secondary  to technical difficulties. Hospitalization complicated by acute renal failure and encephalopathy. Liver function tests have improved spontaneously, no further evaluation suggested by GI or surgery. Patient was seen by nephrology for acute renal failure which was multifactorial and continues to improve. Encephalopathy also likely multifactorial and resolving. He is now stable for discharge. Skilled nursing facility was recommended, family refused. He has excellent support and likely will do well at home.  Assessment/Plan:  1. Acute cholecystitis, gangrenous gallbladder: Remains afebrile. Status post cholecystectomy 8/18. Management per surgery.  2. Elevated liver function tests: Continue to improve towards normal. Obstructive pattern, possible choledocholithiasis. ERCP was nondiagnostic.  3. Acute renal failure: Appears resolved. Renal ultrasound unremarkable. Multifactorial, likely prerenal syndrome, ATN, IV contrast, urinary retention. Nephrology consultation appreciated. 4. Acute encephalopathy: Resolving, much improved today. Consistent with delirium secondary to acute illness, lucidity waxes and wanes. No focal neurologic deficits. Possibly secondary to Zosyn. Expect spontaneous improvement will continue at home. 5. Urinary retention: Likely perioperative in nature. Resolved. Voiding well. 6. Hypertension: Stable. 7. Diabetes mellitus: Control improved. Resume oral medications on discharge. 8. Hypokalemia: Replete. Magnesium was normal. 9. Hyponatremia: Resolved. Likely secondary to dehydration.  Consultants:  Gen surgery, Dr. Lovell Sheehan  Gastroenterology Procedures:  Laparoscopic cholecystectomy on 8/18  ERCP 8/19: Normal-appearing ampulla and normal-appearing pancreatic duct. Unable to achieve bile duct cannulation  Antibiotics:  Zosyn 8/18 >> 8/22  Discharge Instructions  Discharge Orders   Future Appointments Provider Department Dept Phone   03/05/2013 8:30 AM Donita Brooks, MD [facility] [phone]   Future Orders Complete By Expires   Diet Carb Modified  As directed    Discharge instructions  As directed    Comments:     Ambulate  only with assistance. Call physician or seek immediate medical attention for fever, increased pain or worsening of condition.   Face-to-face encounter (required for Medicare/Medicaid patients)  As directed    Comments:     I Yaeli Hartung certify that this patient is under my care and that I, or a nurse practitioner or physician's assistant working with me, had a face-to-face encounter that meets the physician face-to-face encounter requirements with this patient on 03/01/2013. The encounter with the patient was in whole, or in part for the following medical condition(s) which is the primary reason for home health care (List medical condition): Acute cholecystitis   Questions:     The encounter with the patient was in whole, or in part, for the following medical condition, which is the primary reason for home health care:  Acute cholecystitis   I certify that, based on my findings, the following services are medically necessary home health services:  Physical therapy   My clinical findings support the need for the above services:  Unsafe ambulation due to balance issues   Further, I certify that my clinical findings support that this patient is homebound due to:  Unable to leave home safely without assistance   Reason for Medically Necessary Home Health Services:  Therapy- Investment banker, operational, Patent examiner   Home Health  As directed    Questions:     To provide the following care/treatments:  PT   RN   Home Health Aide   Social work   Increase activity slowly  As directed        Medication List    STOP taking these medications       metFORMIN 1000 MG tablet  Commonly known as:  GLUCOPHAGE      TAKE these medications       albuterol (2.5 MG/3ML) 0.083% nebulizer solution  Commonly known as:   PROVENTIL  Take 2.5 mg by nebulization every 6 (six) hours as needed. Shortness of breath.     amLODipine 2.5 MG tablet  Commonly known as:  NORVASC  Take 1 tablet (2.5 mg total) by mouth daily.     aspirin EC 81 MG tablet  Take 81 mg by mouth at bedtime.     Fluticasone-Salmeterol 250-50 MCG/DOSE Aepb  Commonly known as:  ADVAIR  Inhale 1 puff into the lungs every 12 (twelve) hours.     glipiZIDE 10 MG tablet  Commonly known as:  GLUCOTROL  Take 10 mg by mouth daily.     HYDROcodone-acetaminophen 5-325 MG per tablet  Commonly known as:  NORCO/VICODIN  Take 1 tablet by mouth every 4 (four) hours as needed (moderate pain).     montelukast 10 MG tablet  Commonly known as:  SINGULAIR  Take 10 mg by mouth at bedtime.     olmesartan 40 MG tablet  Commonly known as:  BENICAR  Take 40 mg by mouth daily.     omeprazole 40 MG capsule  Commonly known as:  PRILOSEC  Take 40 mg by mouth daily.     sitaGLIPtin 100 MG tablet  Commonly known as:  JANUVIA  Take 1 tablet (100 mg total) by mouth daily.       Allergies  Allergen Reactions  . Ace Inhibitors     The results of significant diagnostics from this hospitalization (including imaging, microbiology, ancillary and laboratory) are listed below for reference.    Significant Diagnostic Studies: Ct Abdomen Pelvis W Wo Contrast  02/25/2013   CLINICAL DATA:  Postop cholecystectomy. Recent ERCP.  EXAM: CT ABDOMEN AND PELVIS WITHOUT AND WITH CONTRAST  TECHNIQUE: Multidetector CT imaging of the abdomen and pelvis was performed without contrast material in one or both body regions, followed by contrast material(s) and further sections in one or both body regions.  CONTRAST:  80mL OMNIPAQUE IOHEXOL 300 MG/ML SOLN, 50mL OMNIPAQUE IOHEXOL 300 MG/ML SOLN  COMPARISON:  ERCP 02/24/2013. CT 02/20/2013.  FINDINGS: Small right pleural effusion. Atelectasis or consolidation in the right lower lobe. No focal opacity in the left base. Coronary artery  calcifications are noted. Oral contrast material is noted within the esophagus which could be related to reflux or dysmotility.  Prior cholecystectomy. Surgical drain is present in the right upper quadrant period postsurgical stranding and a few locules of gas in the right upper quadrant. Pockets of gas within the right lateral abdominal wall. These findings are presumably due to recent surgery. Diffuse fatty infiltration of the liver. No biliary ductal dilatation. Spleen, pancreas, adrenals and stomach are unremarkable. 2.2 cm cyst in the midpole of the right kidney. Kidneys otherwise unremarkable. No hydronephrosis.  Diffuse colonic diverticulosis. No changes of active diverticulitis. Small bowel is decompressed. Aorta and branch vessels are calcified, non aneurysmal. Urinary bladder is unremarkable. No free fluid, free air or adenopathy. No acute bony abnormality.  IMPRESSION: Postoperative changes from prior cholecystectomy. Postsurgical changes in the right upper quadrant with postsurgical drain in place.  Fatty infiltration of the liver.  Colonic diverticulosis.  Small right pleural effusion.   Electronically Signed   By: Charlett Nose   On: 02/25/2013 11:26   Ct Head W Wo Contrast  02/25/2013   CLINICAL DATA:  Mental status changes.  EXAM: CT HEAD WITHOUT AND WITH CONTRAST  TECHNIQUE: Contiguous axial images were obtained from the base of the skull through the vertex without and with intravenous contrast  CONTRAST:  80 cc Omnipaque 300 IV.  COMPARISON:  11/30/2010  FINDINGS: Bold anterior left frontal infarct. Chronic microvascular changes throughout the deep white matter. No hemorrhage, hydrocephalus or acute infarction. No enhancing lesions following contrast administration. No acute calvarial abnormality. Visualized paranasal sinuses and mastoids clear. Orbital soft tissues unremarkable.  IMPRESSION: Atrophy, chronic microvascular changes.  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  No acute findings.    Electronically Signed   By: Charlett Nose   On: 02/25/2013 11:18   US Abdomen Complete  02/20/2013   *RADIOLOGY REPORT*  Clinical Data:  Abdominal pain.  Evidence of choledocholithiasis and cholelithiasis on the current CT.  COMPLETE ABDOMINAL ULTRASOUND  Comparison:  Abdomen CT, 02/20/2013  Findings:  Gallbladder:  Multiple small gallstones are noted along with a small amount of sludge.  The wall is not well defined, but does not appear thickened.  There is some low echogenicity in the liver adjacent to the gallbladder that is likely focal fatty sparing.  No pericholecystic fluid.  There is no sonographic Murphy's sign.  Common bile duct:  Common bile duct measures 4.6 mm proximally.  It is not well seen distally.  No duct stone is evident, but a stone in the more distal duct is not excluded on this exam.  Liver:  Liver is echogenic with attenuation of the sound beam, findings consistent with diffuse fatty infiltration.  No liver mass or focal lesion is seen.  As noted above, there is some focal fatty sparing along the gallbladder fossa.  IVC:  Appears normal.  Pancreas:  Pancreas is poorly visualized and cannot be accurately assessed.  Of note, it  was normal on CT.  Spleen:  Normal in size echogenicity measuring 7.7 cm.  Right Kidney:  Right kidney demonstrates a 2.4 cm mid pole cyst, but is otherwise unremarkable measuring 10.8 cm in length.  Left Kidney:  Normal measuring 11.3 cm in length.  Abdominal aorta:  No aneurysm identified.  IMPRESSION: Cholelithiasis, but no ultrasound evidence of acute cholecystitis.  No bile duct dilation or bile duct stone is seen.  The distal bile duct was not visualized.  Incomplete visualization of the pancreas.  Extensive fatty infiltration of the liver with some focal fatty sparing along the gallbladder fossa.  Right mid pole renal cyst.   Original Report Authenticated By: Amie Portland, M.D.   Ct Abdomen Pelvis W Contrast  02/20/2013   *RADIOLOGY REPORT*  Clinical Data:  Abdominal pain.  CT ABDOMEN AND PELVIS WITH CONTRAST  Technique:  Multidetector CT imaging of the abdomen and pelvis was performed following the standard protocol during bolus administration of intravenous contrast.  Contrast: 50mL OMNIPAQUE IOHEXOL 300 MG/ML  SOLN, OMNIPAQUE IOHEXOL 300 MG/ML  SOLN  Comparison: None.  Findings:  BODY WALL: Unremarkable.  LOWER CHEST:  Mediastinum: Coronary artery atherosclerosis.  Small sliding-type hiatal hernia.  Lungs/pleura: No consolidation.  ABDOMEN/PELVIS:  Liver: Diffuse low attenuation consistent with steatosis.  Biliary: Layering high-density material, including in the neck or cystic duct of the gallbladder, consistent with stones.  No definite pericholecystic inflammation, although patient motion limits assessment of the gallbladder wall.  Pancreas: Unremarkable.  Spleen: Unremarkable.  Adrenals: 1 cm myelolipoma in the left adrenal gland.  Kidneys and ureters: 2 cm cyst in the interpolar right kidney. Another low attenuation focus in the posterior hilar lip on the right is too small to characterize, although also likely a cyst.  Bladder: Unremarkable.  Bowel: No obstruction. Negative for appendicitis.  Colonic diverticulosis without diverticulitis.  Sliding-type hiatal hernia as above.  Retroperitoneum: Minimal haziness of the small bowel mesentery.  Peritoneum: No free fluid or gas.  Reproductive: Unremarkable.  Vascular: No acute abnormality.  OSSEOUS: No acute abnormalities. Advanced lower lumbar degenerative disc and facet disease, with facet overgrowth causing notable spinal canal stenosis at L4-5.  L5-S1 degenerative disc disease, advanced, with foraminal stenoses.  IMPRESSION: 1.Cholelithiasis with stones in the gallbladder neck, and possibly the cystic duct.  2.  Incidental findings include coronary artery atherosclerosis, colonic diverticulosis, and hepatic steatosis.   Original Report Authenticated By: Tiburcio Pea   US Renal  02/25/2013   CLINICAL  DATA:  Acute renal failure.  EXAM: RENAL/URINARY TRACT ULTRASOUND COMPLETE  COMPARISON:  CT 02/25/2013  FINDINGS: Right Kidney: 10.7 cm. Normal echotexture. No visible focal abnormality. The previously seen cyst by CT cannot be appreciated on ultrasound.  Left Kidney: 12.4 cm. Normal size and echotexture. No focal abnormality. No hydronephrosis.  Bladder:  Normal appearance.  IMPRESSION: Unremarkable study.  No hydronephrosis.   Electronically Signed   By: Charlett Nose   On: 02/25/2013 13:21   Dg Ercp With Sphincterotomy  02/24/2013   *RADIOLOGY REPORT*  Clinical Data: Choledocholithiasis  ERCP  Comparison:  Ultrasound 02/20/2013  Technique:  Multiple spot images obtained with the fluoroscopic device and submitted for interpretation post-procedure.  ERCP was performed by Dr. Jena Gauss.  Findings: Initial images demonstrate placement of a wire into the common duct.  No images which entirely opacify the  biliary system. Therefore, low sensitivity for choledocholithiasis.  IMPRESSION: Limited ERCP imaging.  These images were submitted for radiologic interpretation only. Please see the procedural report for the  amount of contrast and the fluoroscopy time utilized.   Original Report Authenticated By: Jeronimo Greaves, M.D.    Microbiology: Recent Results (from the past 240 hour(s))  SURGICAL PCR SCREEN     Status: None   Collection Time    02/22/13 11:14 PM      Result Value Range Status   MRSA, PCR NEGATIVE  NEGATIVE Final   Staphylococcus aureus NEGATIVE  NEGATIVE Final   Comment:            The Xpert SA Assay (FDA     approved for NASAL specimens     in patients over 64 years of age),     is one component of     a comprehensive surveillance     program.  Test performance has     been validated by [employer] for patients greater     than or equal to 13 year old.     It is not intended     to diagnose infection nor to     guide or monitor treatment.  URINE CULTURE     Status: None   Collection  Time    02/25/13  1:24 PM      Result Value Range Status   Specimen Description URINE, CLEAN CATCH   Final   Special Requests NONE   Final   Culture  Setup Time     Final   Value: 02/25/2013 17:46     Performed at [facility] Count     Final   Value: NO GROWTH     Performed at [facility]   Culture     Final   Value: NO GROWTH     Performed at [facility]   Report Status 02/26/2013 FINAL   Final     Labs: Basic Metabolic Panel:  Recent Labs Lab 02/23/13 0532 02/24/13 0456 02/25/13 0615 02/26/13 0517 02/27/13 0529 02/28/13 0531 03/01/13 0512  NA 140 139 138 146* 143 141 138  K 4.1 3.4* 3.6 3.3* 2.9* 3.1* 3.0*  CL 104 103 104 110 103 105 102  CO2 24 23 22 25 29 25 24   GLUCOSE 236* 256* 175* 120* 209* 90 129*  BUN 15 17 19 23  26* 26* 22  CREATININE 1.52* 1.62* 1.88* 2.06* 2.05* 1.81* 1.48*  CALCIUM 8.9 8.9 8.5 8.4 8.8 8.2* 8.2*  MG  --  2.0  --   --   --  1.8  --   PHOS  --  2.4  --   --  3.1  --   --    Liver Function Tests:  Recent Labs Lab 02/24/13 0456 02/25/13 0615 02/26/13 0517 02/27/13 0529 03/01/13 0512  AST 191* 115* 110* 134* 75*  ALT 93* 63* 60* 82* 69*  ALKPHOS 113 104 105 130* 166*  BILITOT 4.5* 5.1* 3.6* 2.5* 1.6*  PROT 7.0 6.7 6.5 7.4 7.1  ALBUMIN 2.4* 2.3* 2.0* 2.2* 2.3*    Recent Labs Lab 02/25/13 0615  LIPASE 49   CBC:  Recent Labs Lab 02/23/13 0532 02/24/13 0456 02/25/13 0615 02/26/13 0517 02/27/13 0529 02/28/13 0531  WBC 13.6* 10.2 10.1 9.8 12.1* 11.3*  NEUTROABS 11.4*  --   --   --   --   --   HGB 11.9* 11.6* 10.8* 9.7* 10.3* 10.0*  HCT 33.8* 33.7* 30.9* 29.1* 30.4* 28.9*  MCV 88.7 88.2 88.8 91.2 89.9 89.2  PLT 200 176 191 218 279 324  CBG:  Recent Labs Lab 02/28/13 0845 02/28/13 1130 02/28/13 1702 02/28/13 2116 03/01/13 0739  GLUCAP 143* 143* 126* 202* 113*    Principal Problem:   Cholecystitis, acute Active Problems:   Hypertension   Cholelithiasis   DM (diabetes  mellitus)   Choledocholithiasis   Acute renal failure   Acute encephalopathy   Urinary retention   Time coordinating discharge: 25 minutes  Signed:  Brendia Sacks, MD [facility] 03/01/2013, 10:38 AM

## 2013-03-01 NOTE — Progress Notes (Signed)
Subjective: Interval History: Patient alert and in no apparent distress. Presently his mental status seems to be improving. Still seems to be disoriented but has some questions.  Objective: Vital signs in last 24 hours: Temp:  [98 F (36.7 C)-98.4 F (36.9 C)] 98.4 F (36.9 C) (08/24 0552) Pulse Rate:  [79-86] 79 (08/24 0552) Resp:  [18-20] 18 (08/24 0552) BP: (109-133)/(50-86) 109/50 mmHg (08/24 0552) SpO2:  [97 %-100 %] 100 % (08/24 0552) Weight change:   Intake/Output from previous day: 08/23 0701 - 08/24 0700 In: -  Out: 400 [Urine:400] Intake/Output this shift:    The patient is alert. Patient is presently eating his food. Chest is clear to auscultation no rhonchi or egophony. Heart exam regular rate and rhythm Abdomen  possetive bowl sound Extremities no edema.   Lab Results:  Recent Labs  02/27/13 0529 02/28/13 0531  WBC 12.1* 11.3*  HGB 10.3* 10.0*  HCT 30.4* 28.9*  PLT 279 324   BMET:   Recent Labs  02/28/13 0531 03/01/13 0512  NA 141 138  K 3.1* 3.0*  CL 105 102  CO2 25 24  GLUCOSE 90 129*  BUN 26* 22  CREATININE 1.81* 1.48*  CALCIUM 8.2* 8.2*   No results found for this basename: PTH,  in the last 72 hours Iron Studies:   Recent Labs  02/27/13 0529  IRON 63  TIBC 154*  FERRITIN 6217*    Studies/Results: No results found.  I have reviewed the patient's current medications.  Assessment/Plan: Problem #1 acute kidney injury presently seems to be multifactorial including prerenal syndrome/ATN/. Presently his BUN is 22 and creatinine is 1.48. His renal function is progressivly improving Problem #2 chronic renal failure. Ultrasound left kidney 12.4 and right kidney 10.7 hence patient seems to have an equal kidney. There is no hydronephrosis. The etiology for this and kidney is not clear. Problem #3 hypertension his blood pressure seems to be reasonably controlled Problem #4 diabetes Problem #5 history of cholecystitis status post  cholecystectomy. Problem #6 anemia etiology as this moment is not clear H&H is low but stable Problem #7 metabolic bone disease calcium is with  in acceptable range and phospherous is normal Problem #8 hypokalemia :.his potassium remains low. Presently unable to keep his IV fluid and patient was not getting any potassium through the IV. Plan:1] We'll check his basic metabolic panel and CBC  in am         2] will start patient on KCl 30 mEq by mouth twice a day and DC IV fluid.       LOS: 8 days   Edmund Holcomb S 03/01/2013,8:42 AM

## 2013-03-01 NOTE — Plan of Care (Signed)
Problem: Discharge Progression Outcomes Goal: Barriers To Progression Addressed/Resolved Discussed challenges ahead for family if pt is discharged today.

## 2013-03-01 NOTE — Discharge Summary (Signed)
Pt and family stated that he was ready to go home and he had no pain.  Pt's IV was removed prior to DC, but pt's JP drain was left intact per Dr. Illene Regulus orders.  Pt's family was in the room when DC papers were given and explained.  [facility] was called to inform of pt's DC.  Family was educated on JP drain and given extra supplies to change dressing if needed.  They were also shown surgery sites and educated on wheat they would see if infection occurred.  Given Norco script, told of FU appointments and also gave phone number to [facility]. Pt was wheeled to car by me Banker) and family.  Pt's family was given extensive education on how to focus on safety at home when taking care of pt.  Pt left hospital, though improved, confused and requiring constant care for mobility, care and safety.  Pt's wife declined going to a rehab facility on DC.

## 2013-03-02 ENCOUNTER — Telehealth: Payer: Self-pay | Admitting: Family Medicine

## 2013-03-02 DIAGNOSIS — Z48814 Encounter for surgical aftercare following surgery on the teeth or oral cavity: Secondary | ICD-10-CM

## 2013-03-02 DIAGNOSIS — E119 Type 2 diabetes mellitus without complications: Secondary | ICD-10-CM

## 2013-03-02 DIAGNOSIS — J449 Chronic obstructive pulmonary disease, unspecified: Secondary | ICD-10-CM

## 2013-03-02 DIAGNOSIS — J4489 Other specified chronic obstructive pulmonary disease: Secondary | ICD-10-CM

## 2013-03-02 DIAGNOSIS — I10 Essential (primary) hypertension: Secondary | ICD-10-CM

## 2013-03-02 DIAGNOSIS — Z48815 Encounter for surgical aftercare following surgery on the digestive system: Secondary | ICD-10-CM

## 2013-03-02 NOTE — Telephone Encounter (Signed)
Check his sugars before meals for 3 days and coe in for ov Thursday to discuss.

## 2013-03-02 NOTE — Telephone Encounter (Signed)
Pt just had emergency gall bladder surgery. They took him off of the metformin because of it "interacting with the dye they had to use". Wife said something about the surgeon wanting him to go on insulin to control his sugar and she wants to know what your opinion is on this. Surgery notes are in EPIC.

## 2013-03-03 NOTE — Telephone Encounter (Signed)
Pt is aware and already had an appointment set up for Thursday.

## 2013-03-05 ENCOUNTER — Ambulatory Visit: Payer: Medicare Other | Admitting: Family Medicine

## 2013-03-12 ENCOUNTER — Ambulatory Visit (INDEPENDENT_AMBULATORY_CARE_PROVIDER_SITE_OTHER): Payer: Medicare Other | Admitting: Family Medicine

## 2013-03-12 ENCOUNTER — Encounter: Payer: Self-pay | Admitting: Family Medicine

## 2013-03-12 VITALS — BP 120/60 | HR 78 | Temp 97.7°F | Resp 18 | Wt 195.0 lb

## 2013-03-12 DIAGNOSIS — K8051 Calculus of bile duct without cholangitis or cholecystitis with obstruction: Secondary | ICD-10-CM

## 2013-03-12 DIAGNOSIS — F329 Major depressive disorder, single episode, unspecified: Secondary | ICD-10-CM

## 2013-03-12 DIAGNOSIS — R4182 Altered mental status, unspecified: Secondary | ICD-10-CM

## 2013-03-12 DIAGNOSIS — I6930 Unspecified sequelae of cerebral infarction: Secondary | ICD-10-CM | POA: Insufficient documentation

## 2013-03-12 LAB — COMPLETE METABOLIC PANEL WITH GFR
ALT: 23 U/L (ref 0–53)
Alkaline Phosphatase: 140 U/L — ABNORMAL HIGH (ref 39–117)
CO2: 21 mEq/L (ref 19–32)
Creat: 1.48 mg/dL — ABNORMAL HIGH (ref 0.50–1.35)
GFR, Est African American: 53 mL/min — ABNORMAL LOW
Total Bilirubin: 1.1 mg/dL (ref 0.3–1.2)

## 2013-03-12 MED ORDER — PIOGLITAZONE HCL 30 MG PO TABS: 30.0000 mg | ORAL_TABLET | Freq: Every day | ORAL | Status: DC

## 2013-03-12 MED ORDER — ESCITALOPRAM OXALATE 10 MG PO TABS: 10.0000 mg | ORAL_TABLET | Freq: Every day | ORAL | Status: DC

## 2013-03-12 NOTE — Progress Notes (Signed)
Subjective:    Patient ID: [name], male    DOB: 1936-03-09, 77 y.o.   MRN: [id number]  HPI  Patient recently was hospitalized with cholecystitis and choledocholithiasis. He required urgent cholecystectomy. In the hospital he had delirium. A CT scan obtained in the hospital revealed an old left sided cerebral infarction. We had diagnosed the patient with TIA in the past but apparently he has had a silent stroke as well. Ever since discharge from the hospital he is been slightly confused. He is forgetting peoples names. He is getting confused as to who people are. He can be disoriented at times. He is also extremely emotional. He is crying daily. He has no appetite and refuses to eat due to early satiety. He is anxious. He cannot sleep. He also has type 2 diabetes which was previously well controlled on Januvia, glipizide, and metformin. Metformin was discontinued in the hospital due to the contrast for a CT scan.  He has not resumed any medication in its place and his blood sugars are running 100-200 after meals.     Prior to his hospitalization, the patient had just discontinued Lexapro after noticing significant improvement in anxiety and depression. Past Medical History  Diagnosis Date  . COPD (chronic obstructive pulmonary disease)   . Hx-TIA (transient ischemic attack)   . Diabetes mellitus   . Hypertension   . Hyperlipidemia   . Anxiety   . CVA (cerebral vascular accident)    Current Outpatient Prescriptions on File Prior to Visit  Medication Sig Dispense Refill  . albuterol (PROVENTIL) (2.5 MG/3ML) 0.083% nebulizer solution Take 2.5 mg by nebulization every 6 (six) hours as needed. Shortness of breath.      [name] Kitchen amLODipine (NORVASC) 2.5 MG tablet Take 1 tablet (2.5 mg total) by mouth daily.  30 tablet  3  . aspirin EC 81 MG tablet Take 81 mg by mouth at bedtime.       . Fluticasone-Salmeterol (ADVAIR) 250-50 MCG/DOSE AEPB Inhale 1 puff into the lungs every 12 (twelve) hours.  60  each  11  . glipiZIDE (GLUCOTROL) 10 MG tablet Take 10 mg by mouth daily.      [name] Kitchen HYDROcodone-acetaminophen (NORCO/VICODIN) 5-325 MG per tablet Take 1 tablet by mouth every 4 (four) hours as needed (moderate pain).  30 tablet  0  . montelukast (SINGULAIR) 10 MG tablet Take 10 mg by mouth at bedtime.      [name] Kitchen olmesartan (BENICAR) 40 MG tablet Take 40 mg by mouth daily.      [name] Kitchen omeprazole (PRILOSEC) 40 MG capsule Take 40 mg by mouth daily.      . sitaGLIPtin (JANUVIA) 100 MG tablet Take 1 tablet (100 mg total) by mouth daily.  30 tablet  5   No current facility-administered medications on file prior to visit.   Allergies  Allergen Reactions  . Ace Inhibitors    History   Social History  . Marital Status: Married    Spouse Name: N/A    Number of Children: N/A  . Years of Education: N/A   Occupational History  . Not on file.   Social History Main Topics  . Smoking status: Never Smoker   . Smokeless tobacco: Not on file  . Alcohol Use: No  . Drug Use: No  . Sexual Activity: Not on file   Other Topics Concern  . Not on file   Social History Narrative  . No narrative on file    Review of Systems  All other  systems reviewed and are negative.       Objective:   Physical Exam  Vitals reviewed. Constitutional: He is oriented to person, place, and time. He appears well-developed and well-nourished.  Eyes: Pupils are equal, round, and reactive to light.  Neck: Neck supple. No JVD present. No thyromegaly present.  Cardiovascular: Normal rate, regular rhythm, normal heart sounds and intact distal pulses.  Exam reveals no gallop and no friction rub.   No murmur heard. Pulmonary/Chest: Breath sounds normal. No respiratory distress. He has no wheezes. He has no rales. He exhibits no tenderness.  Abdominal: Soft. Bowel sounds are normal. He exhibits no distension and no mass. There is no tenderness. There is no rebound and no guarding.  Lymphadenopathy:    He has no cervical  adenopathy.  Neurological: He is alert and oriented to person, place, and time. He has normal reflexes. He displays normal reflexes. No cranial nerve deficit. He exhibits normal muscle tone.  Psychiatric: His speech is normal and behavior is normal. Judgment and thought content normal. His affect is labile. Cognition and memory are impaired (Slightly impaired and mildly confused). He exhibits a depressed mood.          Assessment & Plan:  1. Choledocholithiasis with obstruction Recheck CBC and CMP to ensure that liver function tests have completely resolved and there is no evidence of leukocytosis. - CBC with Differential - COMPLETE METABOLIC PANEL WITH GFR  2. Type II or unspecified type diabetes mellitus without mention of complication, uncontrolled Begin Actos 30 mg by mouth daily in place of metformin. Recheck in 3-4 weeks.  3. Depression Start Lexapro 10 mg by mouth daily. December previously very well to the patient's depression. I believe the patient is also depressed right now. He is extremely tearful. He has early satiety and no appetite. I like to see if we can improve the symptoms with the Lexapro and recheck in 2-4 weeks. 4. Altered mental status This is likely multifactorial stemming from hisremote stroke, delirium after hospitalization, mild vascular dementia, and depression. I will treat the depression with Lexapro 10 mg by mouth daily. I will give his system a chance to recover after his recent hospitalization and recheck the patient in 4 weeks. If the confusion persists I will obtain an MRI of the brain. There are numerous strokes we may want to start Plavix in place of the aspirin for secondary stroke prevention. I may also consider starting [name] for vascular dementia.

## 2013-03-13 LAB — CBC WITH DIFFERENTIAL/PLATELET
Basophils Absolute: 0.1 10*3/uL (ref 0.0–0.1)
Basophils Relative: 1 % (ref 0–1)
Eosinophils Absolute: 0.2 10*3/uL (ref 0.0–0.7)
Eosinophils Relative: 2 % (ref 0–5)
Lymphs Abs: 1.8 10*3/uL (ref 0.7–4.0)
MCH: 30.1 pg (ref 26.0–34.0)
MCHC: 33.7 g/dL (ref 30.0–36.0)
MCV: 89.5 fL (ref 78.0–100.0)
Neutrophils Relative %: 75 % (ref 43–77)
Platelets: 492 10*3/uL — ABNORMAL HIGH (ref 150–400)
RDW: 13 % (ref 11.5–15.5)

## 2013-03-16 ENCOUNTER — Other Ambulatory Visit: Payer: Self-pay | Admitting: Family Medicine

## 2013-03-16 DIAGNOSIS — D72829 Elevated white blood cell count, unspecified: Secondary | ICD-10-CM

## 2013-03-16 DIAGNOSIS — R4182 Altered mental status, unspecified: Secondary | ICD-10-CM

## 2013-03-17 ENCOUNTER — Other Ambulatory Visit: Payer: Medicare Other

## 2013-03-17 ENCOUNTER — Other Ambulatory Visit: Payer: Self-pay | Admitting: Family Medicine

## 2013-03-17 DIAGNOSIS — D72829 Elevated white blood cell count, unspecified: Secondary | ICD-10-CM

## 2013-03-17 LAB — URINALYSIS, ROUTINE W REFLEX MICROSCOPIC
Bilirubin Urine: NEGATIVE
Nitrite: NEGATIVE
Specific Gravity, Urine: 1.025 (ref 1.005–1.030)
Urobilinogen, UA: 1 mg/dL (ref 0.0–1.0)
pH: 5 (ref 5.0–8.0)

## 2013-03-17 LAB — URINALYSIS, MICROSCOPIC ONLY: Crystals: NONE SEEN

## 2013-03-24 ENCOUNTER — Ambulatory Visit (HOSPITAL_COMMUNITY)
Admission: RE | Admit: 2013-03-24 | Discharge: 2013-03-24 | Disposition: A | Discharge status: Home / Self Care | Payer: Medicare Other | Source: Ambulatory Visit | Attending: Family Medicine | Admitting: Family Medicine

## 2013-03-24 DIAGNOSIS — D72829 Elevated white blood cell count, unspecified: Secondary | ICD-10-CM

## 2013-03-24 DIAGNOSIS — I10 Essential (primary) hypertension: Secondary | ICD-10-CM | POA: Insufficient documentation

## 2013-03-28 ENCOUNTER — Other Ambulatory Visit: Payer: Self-pay | Admitting: Family Medicine

## 2013-04-02 ENCOUNTER — Ambulatory Visit (INDEPENDENT_AMBULATORY_CARE_PROVIDER_SITE_OTHER): Payer: Medicare Other | Admitting: Family Medicine

## 2013-04-02 ENCOUNTER — Encounter: Payer: Self-pay | Admitting: Family Medicine

## 2013-04-02 VITALS — BP 98/58 | HR 62 | Temp 98.2°F | Resp 16 | Wt 198.0 lb

## 2013-04-02 DIAGNOSIS — R410 Disorientation, unspecified: Secondary | ICD-10-CM

## 2013-04-02 DIAGNOSIS — R531 Weakness: Secondary | ICD-10-CM

## 2013-04-02 DIAGNOSIS — R5381 Other malaise: Secondary | ICD-10-CM

## 2013-04-02 DIAGNOSIS — F99 Mental disorder, not otherwise specified: Secondary | ICD-10-CM

## 2013-04-02 NOTE — Progress Notes (Signed)
Subjective:    Patient ID: [name], male    DOB: 1935-09-08, 77 y.o.   MRN: [id number]  HPI  03/12/13 Patient recently was hospitalized with cholecystitis and choledocholithiasis. He required urgent cholecystectomy. In the hospital he had delirium. A CT scan obtained in the hospital revealed an old left sided cerebral infarction. We had diagnosed the patient with TIA in the past but apparently he has had a silent stroke as well. Ever since discharge from the hospital he is been slightly confused. He is forgetting peoples names. He is getting confused as to who people are. He can be disoriented at times. He is also extremely emotional. He is crying daily. He has no appetite and refuses to eat due to early satiety. He is anxious. He cannot sleep. He also has type 2 diabetes which was previously well controlled on Januvia, glipizide, and metformin. Metformin was discontinued in the hospital due to the contrast for a CT scan.  He has not resumed any medication in its place and his blood sugars are running 100-200 after meals.     Prior to his hospitalization, the patient had just discontinued Lexapro after noticing significant improvement in anxiety and depression.  At  That time, my plan was: 1. Choledocholithiasis with obstruction Recheck CBC and CMP to ensure that liver function tests have completely resolved and there is no evidence of leukocytosis. - CBC with Differential - COMPLETE METABOLIC PANEL WITH GFR  2. Type II or unspecified type diabetes mellitus without mention of complication, uncontrolled Begin Actos 30 mg by mouth daily in place of metformin. Recheck in 3-4 weeks.  3. Depression Start Lexapro 10 mg by mouth daily. December previously very well to the patient's depression. I believe the patient is also depressed right now. He is extremely tearful. He has early satiety and no appetite. I like to see if we can improve the symptoms with the Lexapro and recheck in 2-4 weeks. 4.  Altered mental status This is likely multifactorial stemming from hisremote stroke, delirium after hospitalization, mild vascular dementia, and depression. I will treat the depression with Lexapro 10 mg by mouth daily. I will give his system a chance to recover after his recent hospitalization and recheck the patient in 4 weeks. If the confusion persists I will obtain an MRI of the brain. There are numerous strokes we may want to start Plavix in place of the aspirin for secondary stroke prevention. I may also consider starting [name] for vascular dementia.  04/02/13 Patient continues to remain confused. Furthermore he is weak. He is having a difficult time walking. His weight is up 3 pounds. He is still extremely emotional and depressed.  He is currently wearing his T-shirt inside out. He has to have help wrapping around the house. His most recent CT scan showed a remote infarct and diffuse chronic microvascular atrophy. However he has not had an evaluation for recent stroke.  Past Medical History  Diagnosis Date  . COPD (chronic obstructive pulmonary disease)   . Hx-TIA (transient ischemic attack)   . Diabetes mellitus   . Hypertension   . Hyperlipidemia   . Anxiety   . CVA (cerebral vascular accident)    Current Outpatient Prescriptions on File Prior to Visit  Medication Sig Dispense Refill  . albuterol (PROVENTIL) (2.5 MG/3ML) 0.083% nebulizer solution Take 2.5 mg by nebulization every 6 (six) hours as needed. Shortness of breath.      [name] Kitchen amLODipine (NORVASC) 2.5 MG tablet Take 1 tablet (2.5 mg total)  by mouth daily.  30 tablet  3  . aspirin EC 81 MG tablet Take 81 mg by mouth at bedtime.       [name] Kitchen escitalopram (LEXAPRO) 10 MG tablet Take 1 tablet (10 mg total) by mouth daily.  30 tablet  5  . Fluticasone-Salmeterol (ADVAIR) 250-50 MCG/DOSE AEPB Inhale 1 puff into the lungs every 12 (twelve) hours.  60 each  11  . glipiZIDE (GLUCOTROL) 10 MG tablet Take 10 mg by mouth daily.      [name] Kitchen  HYDROcodone-acetaminophen (NORCO/VICODIN) 5-325 MG per tablet Take 1 tablet by mouth every 4 (four) hours as needed (moderate pain).  30 tablet  0  . montelukast (SINGULAIR) 10 MG tablet Take 10 mg by mouth at bedtime.      [name] Kitchen olmesartan (BENICAR) 40 MG tablet Take 40 mg by mouth daily.      [name] Kitchen omeprazole (PRILOSEC) 40 MG capsule Take 40 mg by mouth daily.      . pioglitazone (ACTOS) 30 MG tablet Take 1 tablet (30 mg total) by mouth daily.  30 tablet  5  . sitaGLIPtin (JANUVIA) 100 MG tablet Take 1 tablet (100 mg total) by mouth daily.  30 tablet  5   No current facility-administered medications on file prior to visit.   Allergies  Allergen Reactions  . Ace Inhibitors    History   Social History  . Marital Status: Married    Spouse Name: N/A    Number of Children: N/A  . Years of Education: N/A   Occupational History  . Not on file.   Social History Main Topics  . Smoking status: Never Smoker   . Smokeless tobacco: Not on file  . Alcohol Use: No  . Drug Use: No  . Sexual Activity: Not on file   Other Topics Concern  . Not on file   Social History Narrative  . No narrative on file    Review of Systems  All other systems reviewed and are negative.       Objective:   Physical Exam  Vitals reviewed. Constitutional: He is oriented to person, place, and time. He appears well-developed and well-nourished.  Eyes: Pupils are equal, round, and reactive to light.  Neck: Neck supple. No JVD present. No thyromegaly present.  Cardiovascular: Normal rate, regular rhythm, normal heart sounds and intact distal pulses.  Exam reveals no gallop and no friction rub.   No murmur heard. Pulmonary/Chest: Breath sounds normal. No respiratory distress. He has no wheezes. He has no rales. He exhibits no tenderness.  Abdominal: Soft. Bowel sounds are normal. He exhibits no distension and no mass. There is no tenderness. There is no rebound and no guarding.  Lymphadenopathy:    He has no  cervical adenopathy.  Neurological: He is alert and oriented to person, place, and time. He has normal reflexes. No cranial nerve deficit. He exhibits normal muscle tone.  Psychiatric: His speech is normal and behavior is normal. Judgment and thought content normal. His affect is labile. Cognition and memory are impaired (Slightly impaired and mildly confused). He exhibits a depressed mood.          Assessment & Plan:  1. Weakness generalized I'm going to check an MRI he's had a recent stroke. Meanwhile the schedule patient for physical therapy to try to help facilitate reconditioning of his leg muscles. - MR Brain W Wo Contrast; Future - Ambulatory referral to Physical Therapy - Ambulatory referral to Home Health  2. Confusion, postoperative I believe  the patient likely has microvascular dementia. I am also concerned he may have had a recent stroke which exacerbated this. I will check an MRI of the brain. He has had a stroke, I would switch his aspirin to Plavix. I recommended the patient not drive. - MR Brain W Wo Contrast; Future - Ambulatory referral to Home Health

## 2013-04-07 ENCOUNTER — Ambulatory Visit
Admission: RE | Admit: 2013-04-07 | Discharge: 2013-04-07 | Disposition: A | Discharge status: Home / Self Care | Payer: Medicare Other | Source: Ambulatory Visit | Attending: Family Medicine | Admitting: Family Medicine

## 2013-04-07 DIAGNOSIS — R531 Weakness: Secondary | ICD-10-CM

## 2013-04-07 DIAGNOSIS — R410 Disorientation, unspecified: Secondary | ICD-10-CM

## 2013-04-07 MED ORDER — GADOBENATE DIMEGLUMINE 529 MG/ML IV SOLN
20.0000 mL | Freq: Once | INTRAVENOUS | Status: AC | PRN
  Administered 2013-04-07: 20 mL via INTRAVENOUS

## 2013-04-10 ENCOUNTER — Telehealth: Payer: Self-pay | Admitting: Family Medicine

## 2013-04-11 ENCOUNTER — Other Ambulatory Visit: Payer: Self-pay | Admitting: Family Medicine

## 2013-04-13 NOTE — Telephone Encounter (Signed)
Spoke to pt's wife 04/10/13

## 2013-04-14 ENCOUNTER — Other Ambulatory Visit: Payer: Self-pay | Admitting: Family Medicine

## 2013-04-16 ENCOUNTER — Other Ambulatory Visit: Payer: Self-pay | Admitting: Family Medicine

## 2013-04-21 ENCOUNTER — Other Ambulatory Visit: Payer: Self-pay | Admitting: Family Medicine

## 2013-04-28 ENCOUNTER — Telehealth: Payer: Self-pay | Admitting: Family Medicine

## 2013-04-28 NOTE — Telephone Encounter (Signed)
Do you want pt to drive now since legs are better and no weakness, [facility] at [facility] wants to know if he can drive.

## 2013-04-28 NOTE — Telephone Encounter (Signed)
Yes I want to see him because he was also confused and disoriented which was another reason I didn't want him driving.

## 2013-04-28 NOTE — Telephone Encounter (Signed)
Patient was told not to drive due to weakness in his legs. Patient is to start therapy at the [facility] tomorrow . He has been walking up to a mile a day . His legs are getting better. Does he need to come in to see Dr.Pickard before driving.  ASAP

## 2013-04-28 NOTE — Telephone Encounter (Signed)
Appt made

## 2013-05-07 ENCOUNTER — Ambulatory Visit (INDEPENDENT_AMBULATORY_CARE_PROVIDER_SITE_OTHER): Payer: Medicare Other | Admitting: Family Medicine

## 2013-05-07 ENCOUNTER — Encounter: Payer: Self-pay | Admitting: Family Medicine

## 2013-05-07 VITALS — BP 132/78 | HR 72 | Temp 97.8°F | Resp 16 | Wt 205.0 lb

## 2013-05-07 DIAGNOSIS — I639 Cerebral infarction, unspecified: Secondary | ICD-10-CM

## 2013-05-07 DIAGNOSIS — I6350 Cerebral infarction due to unspecified occlusion or stenosis of unspecified cerebral artery: Secondary | ICD-10-CM

## 2013-05-07 MED ORDER — CLOPIDOGREL BISULFATE 75 MG PO TABS: 75.0000 mg | ORAL_TABLET | Freq: Every day | ORAL | Status: DC

## 2013-05-07 NOTE — Progress Notes (Signed)
Subjective:    Patient ID: [name], male    DOB: October 22, 1935, 77 y.o.   MRN: [id number]  HPI  03/12/13 Patient recently was hospitalized with cholecystitis and choledocholithiasis. He required urgent cholecystectomy. In the hospital he had delirium. A CT scan obtained in the hospital revealed an old left sided cerebral infarction. We had diagnosed the patient with TIA in the past but apparently he has had a silent stroke as well. Ever since discharge from the hospital he is been slightly confused. He is forgetting peoples names. He is getting confused as to who people are. He can be disoriented at times. He is also extremely emotional. He is crying daily. He has no appetite and refuses to eat due to early satiety. He is anxious. He cannot sleep. He also has type 2 diabetes which was previously well controlled on Januvia, glipizide, and metformin. Metformin was discontinued in the hospital due to the contrast for a CT scan.  He has not resumed any medication in its place and his blood sugars are running 100-200 after meals.     Prior to his hospitalization, the patient had just discontinued Lexapro after noticing significant improvement in anxiety and depression.  At  That time, my plan was: 1. Choledocholithiasis with obstruction Recheck CBC and CMP to ensure that liver function tests have completely resolved and there is no evidence of leukocytosis. - CBC with Differential - COMPLETE METABOLIC PANEL WITH GFR  2. Type II or unspecified type diabetes mellitus without mention of complication, uncontrolled Begin Actos 30 mg by mouth daily in place of metformin. Recheck in 3-4 weeks.  3. Depression Start Lexapro 10 mg by mouth daily. December previously very well to the patient's depression. I believe the patient is also depressed right now. He is extremely tearful. He has early satiety and no appetite. I like to see if we can improve the symptoms with the Lexapro and recheck in 2-4 weeks. 4.  Altered mental status This is likely multifactorial stemming from hisremote stroke, delirium after hospitalization, mild vascular dementia, and depression. I will treat the depression with Lexapro 10 mg by mouth daily. I will give his system a chance to recover after his recent hospitalization and recheck the patient in 4 weeks. If the confusion persists I will obtain an MRI of the brain. There are numerous strokes we may want to start Plavix in place of the aspirin for secondary stroke prevention. I may also consider starting [name] for vascular dementia.  04/02/13 Patient continues to remain confused. Furthermore he is weak. He is having a difficult time walking. His weight is up 3 pounds. He is still extremely emotional and depressed.  He is currently wearing his T-shirt inside out. He has to have help wrapping around the house. His most recent CT scan showed a remote infarct and diffuse chronic microvascular atrophy. However he has not had an evaluation for recent stroke.  At that time, my plan was: 1. Weakness generalized I'm going to check an MRI he's had a recent stroke. Meanwhile the schedule patient for physical therapy to try to help facilitate reconditioning of his leg muscles. - MR Brain W Wo Contrast; Future - Ambulatory referral to Physical Therapy - Ambulatory referral to Home Health  2. Confusion, postoperative I believe the patient likely has microvascular dementia. I am also concerned he may have had a recent stroke which exacerbated this. I will check an MRI of the brain. He has had a stroke, I would switch his aspirin  to Plavix. I recommended the patient not drive. - MR Brain W Wo Contrast; Future - Ambulatory referral to Home Health  MRI revealed: Superimposed small cortically based chronic infarcts in the  left superior frontal gyrus and parietal lobe (series 8, image 20)  not significantly changed. Suggestion of a chronic lacunar infarct  bordering the right basal ganglia  on series 7, image 14.  Given these findings, I switched the patient from aspirin to plavix 75 mg poqday and recommended intense PT for weakness.  He is here today for follow up.  He is dry. His family states that his memory is not quite yet to his baseline. They also state he is very unstable on his feet. He has been getting stronger by exercising on his and at the [facility].  However he continues to have a slightly ataxic gait was staggering particularly standing from a chair or getting out of bed.    Past Medical History  Diagnosis Date  . COPD (chronic obstructive pulmonary disease)   . Hx-TIA (transient ischemic attack)   . Diabetes mellitus   . Hypertension   . Hyperlipidemia   . Anxiety   . CVA (cerebral vascular accident)    Current Outpatient Prescriptions on File Prior to Visit  Medication Sig Dispense Refill  . albuterol (PROVENTIL) (2.5 MG/3ML) 0.083% nebulizer solution Take 2.5 mg by nebulization every 6 (six) hours as needed. Shortness of breath.      [name] Kitchen amLODipine (NORVASC) 2.5 MG tablet Take 1 tablet (2.5 mg total) by mouth daily.  30 tablet  3  . aspirin EC 81 MG tablet Take 81 mg by mouth at bedtime.       [name] Kitchen escitalopram (LEXAPRO) 10 MG tablet Take 1 tablet (10 mg total) by mouth daily.  30 tablet  5  . Fluticasone-Salmeterol (ADVAIR) 250-50 MCG/DOSE AEPB Inhale 1 puff into the lungs every 12 (twelve) hours.  60 each  11  . glipiZIDE (GLUCOTROL) 10 MG tablet Take 10 mg by mouth daily.      [name] Kitchen HYDROcodone-acetaminophen (NORCO/VICODIN) 5-325 MG per tablet Take 1 tablet by mouth every 4 (four) hours as needed (moderate pain).  30 tablet  0  . montelukast (SINGULAIR) 10 MG tablet Take 10 mg by mouth at bedtime.      [name] Kitchen olmesartan (BENICAR) 40 MG tablet Take 40 mg by mouth daily.      [name] Kitchen omeprazole (PRILOSEC) 40 MG capsule Take 40 mg by mouth daily.      . pioglitazone (ACTOS) 30 MG tablet Take 1 tablet (30 mg total) by mouth daily.  30 tablet  5  . pravastatin (PRAVACHOL) 40 MG  tablet TAKE 1 TABLET BY [facility] EVERY EVENING AT BEDTIME. NEEDS OFFICE VISIT  30 tablet  1  . sitaGLIPtin (JANUVIA) 100 MG tablet Take 1 tablet (100 mg total) by mouth daily.  30 tablet  5   No current facility-administered medications on file prior to visit.   Allergies  Allergen Reactions  . Ace Inhibitors    History   Social History  . Marital Status: Married    Spouse Name: N/A    Number of Children: N/A  . Years of Education: N/A   Occupational History  . Not on file.   Social History Main Topics  . Smoking status: Never Smoker   . Smokeless tobacco: Not on file  . Alcohol Use: No  . Drug Use: No  . Sexual Activity: Not on file   Other Topics Concern  . Not on  file   Social History Narrative  . No narrative on file    Review of Systems  All other systems reviewed and are negative.       Objective:   Physical Exam  Vitals reviewed. Constitutional: He is oriented to person, place, and time. He appears well-developed and well-nourished.  Eyes: Pupils are equal, round, and reactive to light.  Neck: Neck supple. No JVD present. No thyromegaly present.  Cardiovascular: Normal rate, regular rhythm, normal heart sounds and intact distal pulses.  Exam reveals no gallop and no friction rub.   No murmur heard. Pulmonary/Chest: Breath sounds normal. No respiratory distress. He has no wheezes. He has no rales. He exhibits no tenderness.  Abdominal: Soft. Bowel sounds are normal. He exhibits no distension and no mass. There is no tenderness. There is no rebound and no guarding.  Lymphadenopathy:    He has no cervical adenopathy.  Neurological: He is alert and oriented to person, place, and time. He has normal reflexes. No cranial nerve deficit. He exhibits normal muscle tone.  Psychiatric: His speech is normal and behavior is normal. Judgment and thought content normal. His affect is not labile. Cognition and memory are not impaired (Slightly impaired and mildly confused).  He does not exhibit a depressed mood.          Assessment & Plan:    1. CVA (cerebral vascular accident) Patient appears to be in brighter spirits. He is not as depressed. He seems stronger. He is walking without a cane. He does stagger upon standing. I believe this is a reflection of his stroke. Again I feel that he is ready to drive at this point. I believe his mild confusion and memory loss and slow reaction times make him a potential risk on the road.  However I will arrange physical therapy to get to neurology for vestibular rehabilitation to try to help improve his balance. Also recommended discontinuing aspirin and starting Plavix 75 mg by mouth daily for stroke prevention. - Ambulatory referral to Physical Therapy

## 2013-05-19 ENCOUNTER — Ambulatory Visit (INDEPENDENT_AMBULATORY_CARE_PROVIDER_SITE_OTHER): Payer: Medicare Other | Admitting: Diagnostic Neuroimaging

## 2013-05-19 ENCOUNTER — Encounter: Payer: Self-pay | Admitting: Diagnostic Neuroimaging

## 2013-05-19 VITALS — BP 167/95 | HR 72 | Ht 72.0 in | Wt 202.5 lb

## 2013-05-19 DIAGNOSIS — I6340 Cerebral infarction due to embolism of unspecified cerebral artery: Secondary | ICD-10-CM

## 2013-05-19 DIAGNOSIS — R413 Other amnesia: Secondary | ICD-10-CM

## 2013-05-19 DIAGNOSIS — R269 Unspecified abnormalities of gait and mobility: Secondary | ICD-10-CM

## 2013-05-19 DIAGNOSIS — I639 Cerebral infarction, unspecified: Secondary | ICD-10-CM

## 2013-05-19 NOTE — Progress Notes (Signed)
[facility]  PATIENT: [name] DOB: 09/10/1935  REFERRING CLINICIAN: Pickard HISTORY FROM: patient REASON FOR VISIT: new consult   HISTORICAL  CHIEF COMPLAINT:  Chief Complaint  Patient presents with  . Cerebrovascular Accident    follow up    HISTORY OF PRESENT ILLNESS:   77 year old right-handed male with diabetes, here for evaluation of weakness, forgetfulness.  Patient was admitted to the hospital in May 2012 for transient ischemic attack. Patient had transient slurred speech and confusion lasting for 30 minutes. MRI showed no acute stroke.  Patient was otherwise in normal health, working, functioning until August 2000 at 67 when he was admitted for acute cholecystitis and gangrenous gallbladder. He also had acute renal failure and acute encephalopathy. Ever since that time he has not recovered to baseline mental or physical abilities. He has not returned to work. He no longer drives.  MRI brain from 04/07/13 shows moderate to severe chronic small vessel ischemic disease, no acute findings. No    REVIEW OF SYSTEMS: Full 14 system review of systems performed and notable only for itching, memory loss, balance difficulty.  ALLERGIES: Allergies  Allergen Reactions  . Ace Inhibitors     HOME MEDICATIONS: Outpatient Prescriptions Prior to Visit  Medication Sig Dispense Refill  . albuterol (PROVENTIL) (2.5 MG/3ML) 0.083% nebulizer solution Take 2.5 mg by nebulization every 6 (six) hours as needed. Shortness of breath.      [name] Kitchen amLODipine (NORVASC) 2.5 MG tablet Take 1 tablet (2.5 mg total) by mouth daily.  30 tablet  3  . clopidogrel (PLAVIX) 75 MG tablet Take 1 tablet (75 mg total) by mouth daily.  30 tablet  3  . Fluticasone-Salmeterol (ADVAIR) 250-50 MCG/DOSE AEPB Inhale 1 puff into the lungs every 12 (twelve) hours.  60 each  11  . HYDROcodone-acetaminophen (NORCO/VICODIN) 5-325 MG per tablet Take 1 tablet by mouth every 4 (four) hours as needed  (moderate pain).  30 tablet  0  . olmesartan (BENICAR) 40 MG tablet Take 40 mg by mouth daily.      . pioglitazone (ACTOS) 30 MG tablet Take 1 tablet (30 mg total) by mouth daily.  30 tablet  5  . pravastatin (PRAVACHOL) 40 MG tablet TAKE 1 TABLET BY MOTUH EVERY EVENING AT BEDTIME. NEEDS OFFICE VISIT  30 tablet  1  . sitaGLIPtin (JANUVIA) 100 MG tablet Take 1 tablet (100 mg total) by mouth daily.  30 tablet  5  . aspirin EC 81 MG tablet Take 81 mg by mouth at bedtime.       [name] Kitchen escitalopram (LEXAPRO) 10 MG tablet Take 1 tablet (10 mg total) by mouth daily.  30 tablet  5  . glipiZIDE (GLUCOTROL) 10 MG tablet Take 10 mg by mouth daily.      . montelukast (SINGULAIR) 10 MG tablet Take 10 mg by mouth at bedtime.      [name] Kitchen omeprazole (PRILOSEC) 40 MG capsule Take 40 mg by mouth daily.       No facility-administered medications prior to visit.    PAST MEDICAL HISTORY: Past Medical History  Diagnosis Date  . COPD (chronic obstructive pulmonary disease)   . Hx-TIA (transient ischemic attack)   . Diabetes mellitus   . Hypertension   . Hyperlipidemia   . Anxiety   . CVA (cerebral vascular accident)     PAST SURGICAL HISTORY: Past Surgical History  Procedure Laterality Date  . Rotator cuff repair    . Cholecystectomy N/A 02/23/2013    Procedure: LAPAROSCOPIC  CHOLECYSTECTOMY;  Surgeon: Dalia Heading, MD;  Location: [facility];  Service: General;  Laterality: N/A;  . Ercp N/A 02/24/2013    Procedure: ENDOSCOPIC RETROGRADE CHOLANGIOPANCREATOGRAPHY;  Surgeon: Corbin Ade, MD;  Location: [facility];  Service: Endoscopy;  Laterality: N/A;    FAMILY HISTORY: Family History  Problem Relation Age of Onset  . Colon cancer Neg Hx     SOCIAL HISTORY:  History   Social History  . Marital Status: Married    Spouse Name: [name]    Number of Children: 6  . Years of Education: 11th   Occupational History  . Retired    Social History Main Topics  . Smoking status: Never Smoker   . Smokeless tobacco:  Never Used  . Alcohol Use: No  . Drug Use: No  . Sexual Activity: Not on file   Other Topics Concern  . Not on file   Social History Narrative   Patient lives at home with family.   Caffeine Use: 1 cup of coffee     PHYSICAL EXAM  Filed Vitals:   05/19/13 1016  BP: 167/95  Pulse: 72  Height: 6' (1.829 m)  Weight: 202 lb 8 oz (91.853 kg)    Not recorded    Body mass index is 27.46 kg/(m^2).  GENERAL EXAM: Patient is in no distress  CARDIOVASCULAR: Regular rate and rhythm, no murmurs, no carotid bruits  NEUROLOGIC: MENTAL STATUS: awake, alert, language fluent, comprehension intact, naming intact; MMSE 24/30. GDS 5.  CRANIAL NERVE: no papilledema on fundoscopic exam, pupils equal and reactive to light, visual fields full to confrontation, extraocular muscles intact, no nystagmus, facial sensation and strength symmetric, uvula midline, shoulder shrug symmetric, tongue midline. MOTOR: normal bulk and tone, full strength in the BUE, BLE; RIGHT PRONATOR DRIFT. SENSORY: VIB 5 SEC AT TOES. COORDINATION: finger-nose-finger, fine finger movements normal REFLEXES: BUE 1, KNEES TRACE, ANKLES 0. GAIT/STATION: UNSTEADY GAIT. STAGGERS.    DIAGNOSTIC DATA (LABS, IMAGING, TESTING) - I reviewed patient records, labs, notes, testing and imaging myself where available.  Lab Results  Component Value Date   WBC 13.0* 03/12/2013   HGB 10.3* 03/12/2013   HCT 30.6* 03/12/2013   MCV 89.5 03/12/2013   PLT 492* 03/12/2013      Component Value Date/Time   NA 134* 03/12/2013 1121   K 4.3 03/12/2013 1121   CL 101 03/12/2013 1121   CO2 21 03/12/2013 1121   GLUCOSE 162* 03/12/2013 1121   BUN 16 03/12/2013 1121   CREATININE 1.48* 03/12/2013 1121   CREATININE 1.48* 03/01/2013 0512   CALCIUM 8.8 03/12/2013 1121   PROT 8.2 03/12/2013 1121   ALBUMIN 3.6 03/12/2013 1121   AST 30 03/12/2013 1121   ALT 23 03/12/2013 1121   ALKPHOS 140* 03/12/2013 1121   BILITOT 1.1 03/12/2013 1121   GFRNONAA 45* 03/01/2013 0512   GFRAA 52*  03/01/2013 0512   Lab Results  Component Value Date   CHOL 179 10/13/2012   HDL 34* 10/13/2012   LDLCALC 84 10/13/2012   TRIG 303* 10/13/2012   CHOLHDL 5.3 10/13/2012   Lab Results  Component Value Date   HGBA1C 6.6* 12/11/2012   No results found for this basename: VITAMINB12   No results found for this basename: TSH   I reviewed images myself and agree with interpretation.   04/07/13 MRI BRAIN - No acute intracranial abnormality. Chronic small and medium-sized vessel ischemia in the brain with progression since 2012. Stable small cortical infarcts in the superior left MCA  territory since that time.   ASSESSMENT AND PLAN  77 y.o. year old male here with hospitalization for acute cholecystitis and acute renal failure, with acute toxic metabolic encephalopathy. Patient has had incomplete recovery. Neuroimaging shows no new findings. He does have significant chronic small vessel extremities as well as prior left MCA small cortical infarcts are stable from 2012.  Ongoing difficulties may be related to progressive vascular dementia. Balance and gait difficulties may be related to small vessel ischemic disease as well as possible diabetic neuropathy.  Ddx: stroke, vascular dementia, diabetic neuropathy  PLAN: - Complete stroke workup - Stroke risk factor management with statin, diabetes management, blood pressure management, Plavix. - PT evaluation - agree with family's concerns about patient's lack of ability to drive and work at this time  Orders Placed This Encounter  Procedures  . US Carotid Duplex Bilateral  . Ambulatory referral to Physical Therapy  . 2D Echocardiogram without contrast    Return in about 6 months (around 11/16/2013).    Suanne Marker, MD 05/19/2013, 11:33 AM Certified in Neurology, Neurophysiology and Neuroimaging  [facility] [address] [place], [place] [address] [phone]

## 2013-05-28 ENCOUNTER — Ambulatory Visit: Payer: Medicare Other

## 2013-06-02 ENCOUNTER — Ambulatory Visit: Payer: Medicare Other

## 2013-06-03 ENCOUNTER — Other Ambulatory Visit: Payer: Self-pay | Admitting: Family Medicine

## 2013-06-05 ENCOUNTER — Other Ambulatory Visit: Payer: Self-pay | Admitting: Family Medicine

## 2013-06-05 ENCOUNTER — Encounter: Payer: Self-pay | Admitting: Cardiovascular Disease

## 2013-06-05 ENCOUNTER — Ambulatory Visit (HOSPITAL_COMMUNITY): Payer: Medicare Other | Attending: Cardiology | Admitting: Cardiology

## 2013-06-05 DIAGNOSIS — R269 Unspecified abnormalities of gait and mobility: Secondary | ICD-10-CM

## 2013-06-05 DIAGNOSIS — I079 Rheumatic tricuspid valve disease, unspecified: Secondary | ICD-10-CM | POA: Insufficient documentation

## 2013-06-05 DIAGNOSIS — J449 Chronic obstructive pulmonary disease, unspecified: Secondary | ICD-10-CM | POA: Insufficient documentation

## 2013-06-05 DIAGNOSIS — Z8673 Personal history of transient ischemic attack (TIA), and cerebral infarction without residual deficits: Secondary | ICD-10-CM | POA: Insufficient documentation

## 2013-06-05 DIAGNOSIS — E785 Hyperlipidemia, unspecified: Secondary | ICD-10-CM | POA: Insufficient documentation

## 2013-06-05 DIAGNOSIS — J4489 Other specified chronic obstructive pulmonary disease: Secondary | ICD-10-CM | POA: Insufficient documentation

## 2013-06-05 DIAGNOSIS — R413 Other amnesia: Secondary | ICD-10-CM

## 2013-06-05 DIAGNOSIS — I10 Essential (primary) hypertension: Secondary | ICD-10-CM | POA: Insufficient documentation

## 2013-06-05 DIAGNOSIS — I639 Cerebral infarction, unspecified: Secondary | ICD-10-CM

## 2013-06-05 DIAGNOSIS — I6350 Cerebral infarction due to unspecified occlusion or stenosis of unspecified cerebral artery: Secondary | ICD-10-CM | POA: Insufficient documentation

## 2013-06-05 DIAGNOSIS — I6340 Cerebral infarction due to embolism of unspecified cerebral artery: Secondary | ICD-10-CM | POA: Insufficient documentation

## 2013-06-05 DIAGNOSIS — E119 Type 2 diabetes mellitus without complications: Secondary | ICD-10-CM | POA: Insufficient documentation

## 2013-06-05 DIAGNOSIS — I359 Nonrheumatic aortic valve disorder, unspecified: Secondary | ICD-10-CM | POA: Insufficient documentation

## 2013-06-05 NOTE — Progress Notes (Signed)
Echo performed. 

## 2013-06-07 ENCOUNTER — Other Ambulatory Visit: Payer: Self-pay | Admitting: Family Medicine

## 2013-06-09 ENCOUNTER — Encounter: Payer: Self-pay | Admitting: Family Medicine

## 2013-06-09 ENCOUNTER — Other Ambulatory Visit: Payer: Self-pay | Admitting: Family Medicine

## 2013-06-09 DIAGNOSIS — E119 Type 2 diabetes mellitus without complications: Secondary | ICD-10-CM

## 2013-06-09 NOTE — Progress Notes (Signed)
Records indicate patient is due to have a Urine Micro Albumin done.  He was called and asked to come to office to leave a urine sample.  Reminder letter also sent.  Lab ordered

## 2013-06-10 ENCOUNTER — Other Ambulatory Visit: Payer: Medicare Other

## 2013-06-11 ENCOUNTER — Other Ambulatory Visit: Payer: Medicare Other

## 2013-06-11 DIAGNOSIS — E119 Type 2 diabetes mellitus without complications: Secondary | ICD-10-CM

## 2013-06-11 LAB — MICROALBUMIN, URINE: Microalb, Ur: 4.57 mg/dL — ABNORMAL HIGH (ref 0.00–1.89)

## 2013-06-24 ENCOUNTER — Ambulatory Visit (INDEPENDENT_AMBULATORY_CARE_PROVIDER_SITE_OTHER): Payer: Medicare Other

## 2013-06-24 ENCOUNTER — Encounter (INDEPENDENT_AMBULATORY_CARE_PROVIDER_SITE_OTHER): Payer: Self-pay

## 2013-06-24 DIAGNOSIS — I639 Cerebral infarction, unspecified: Secondary | ICD-10-CM

## 2013-06-24 DIAGNOSIS — R269 Unspecified abnormalities of gait and mobility: Secondary | ICD-10-CM

## 2013-06-24 DIAGNOSIS — I6350 Cerebral infarction due to unspecified occlusion or stenosis of unspecified cerebral artery: Secondary | ICD-10-CM

## 2013-06-24 DIAGNOSIS — R413 Other amnesia: Secondary | ICD-10-CM

## 2013-07-24 ENCOUNTER — Encounter: Payer: Self-pay | Admitting: Family Medicine

## 2013-07-24 ENCOUNTER — Ambulatory Visit (INDEPENDENT_AMBULATORY_CARE_PROVIDER_SITE_OTHER): Payer: Medicare Other | Admitting: Family Medicine

## 2013-07-24 VITALS — BP 126/78 | HR 90 | Temp 97.1°F | Resp 18 | Wt 207.0 lb

## 2013-07-24 DIAGNOSIS — J45901 Unspecified asthma with (acute) exacerbation: Secondary | ICD-10-CM

## 2013-07-24 MED ORDER — PREDNISONE 20 MG PO TABS: ORAL_TABLET | ORAL | Status: DC

## 2013-07-24 NOTE — Progress Notes (Signed)
Subjective:    Patient ID: [name], male    DOB: Sep 21, 1935, 78 y.o.   MRN: [id number]  HPI Patient quit taking his Advair 3 months ago due to cost. I last days he has been wheezing and progressively becoming more short of breath. He denies any symptoms of an upper respiratory infection. He denies any rhinorrhea or sore throat or otalgia. He has had a cough. He feels tight in his chest. He feels like he cannot catch his breath. He's been using nebulizers every 6 hours benefit however the symptoms returned shortly after he uses them. The cough is nonproductive. He denies any hemoptysis. He denies any chest pain. Past Medical History  Diagnosis Date  . COPD (chronic obstructive pulmonary disease)   . Hx-TIA (transient ischemic attack)   . Diabetes mellitus   . Hypertension   . Hyperlipidemia   . Anxiety   . CVA (cerebral vascular accident)    Current Outpatient Prescriptions on File Prior to Visit  Medication Sig Dispense Refill  . albuterol (PROVENTIL) (2.5 MG/3ML) 0.083% nebulizer solution Take 2.5 mg by nebulization every 6 (six) hours as needed. Shortness of breath.      [name] Kitchen amLODipine (NORVASC) 2.5 MG tablet TAKE 1 TABLET (2.5 MG TOTAL) BY MOUTH DAILY.  30 tablet  3  . aspirin 81 MG tablet Take 81 mg by mouth daily.      . clopidogrel (PLAVIX) 75 MG tablet Take 1 tablet (75 mg total) by mouth daily.  30 tablet  3  . Fluticasone-Salmeterol (ADVAIR) 250-50 MCG/DOSE AEPB Inhale 1 puff into the lungs every 12 (twelve) hours.  60 each  11  . glucose blood test strip       . HYDROcodone-acetaminophen (NORCO/VICODIN) 5-325 MG per tablet Take 1 tablet by mouth every 4 (four) hours as needed (moderate pain).  30 tablet  0  . Lancets 30G MISC       . metFORMIN (GLUCOPHAGE) 1000 MG tablet Take 2 tablets by mouth daily.      . metFORMIN (GLUCOPHAGE) 1000 MG tablet TAKE 1 TABLET TWICE DAILY  60 tablet  3  . olmesartan (BENICAR) 40 MG tablet Take 40 mg by mouth daily.      . pioglitazone  (ACTOS) 30 MG tablet Take 1 tablet (30 mg total) by mouth daily.  30 tablet  5  . pravastatin (PRAVACHOL) 40 MG tablet TAKE 1 TABLET BY MOTUH EVERY EVENING AT BEDTIME. NEEDS OFFICE VISIT  30 tablet  1  . sitaGLIPtin (JANUVIA) 100 MG tablet Take 1 tablet (100 mg total) by mouth daily.  30 tablet  5   No current facility-administered medications on file prior to visit.   Allergies  Allergen Reactions  . Ace Inhibitors    History   Social History  . Marital Status: Married    Spouse Name: [name]    Number of Children: 6  . Years of Education: 11th   Occupational History  . Retired    Social History Main Topics  . Smoking status: Never Smoker   . Smokeless tobacco: Never Used  . Alcohol Use: No  . Drug Use: No  . Sexual Activity: Not on file   Other Topics Concern  . Not on file   Social History Narrative   Patient lives at home with family.   Caffeine Use: 1 cup of coffee      Review of Systems  All other systems reviewed and are negative.       Objective:  Physical Exam  Vitals reviewed. Constitutional: He appears well-developed and well-nourished. No distress.  HENT:  Right Ear: External ear normal.  Left Ear: External ear normal.  Nose: Nose normal.  Mouth/Throat: Oropharynx is clear and moist. No oropharyngeal exudate.  Eyes: Conjunctivae and EOM are normal. Pupils are equal, round, and reactive to light. Right eye exhibits no discharge. Left eye exhibits no discharge. No scleral icterus.  Neck: Normal range of motion. Neck supple. No JVD present. No tracheal deviation present. No thyromegaly present.  Cardiovascular: Normal rate, regular rhythm, normal heart sounds and intact distal pulses.  Exam reveals no gallop and no friction rub.   No murmur heard. Pulmonary/Chest: Effort normal. No stridor. No respiratory distress. He has decreased breath sounds. He has wheezes.  Musculoskeletal: He exhibits no edema.  Lymphadenopathy:    He has no cervical  adenopathy.  Skin: He is not diaphoretic.          Assessment & Plan:  1. Asthma with acute exacerbation Continue albuterol nebulizers every 6 hours. Add prednisone 60 mg by mouth daily for 5 days. I warned the patient that this cause hypoglycemia. I recommended he push fluids try to help prevent dehydration. I also told the patient to resume Advair 250/50 one inhalation twice a day. I gave the patient one month of samples. Quiting his preventative medicine likely caused this exacerbation. I educated the patient as to the purpose of preventative medicines - predniSONE (DELTASONE) 20 MG tablet; 3 tabs poqday x 5 days  Dispense: 15 tablet; Refill: 0

## 2013-09-08 ENCOUNTER — Encounter: Payer: Self-pay | Admitting: Family Medicine

## 2013-09-08 ENCOUNTER — Ambulatory Visit (INDEPENDENT_AMBULATORY_CARE_PROVIDER_SITE_OTHER): Payer: Medicare Other | Admitting: Family Medicine

## 2013-09-08 VITALS — BP 132/80 | HR 88 | Temp 97.3°F | Resp 18 | Ht 70.0 in | Wt 221.0 lb

## 2013-09-08 DIAGNOSIS — J45901 Unspecified asthma with (acute) exacerbation: Secondary | ICD-10-CM

## 2013-09-08 NOTE — Progress Notes (Signed)
Subjective:    Patient ID: [name], male    DOB: 06/03/1936, 78 y.o.   MRN: [id number]  HPI 07/24/13 Patient quit taking his Advair 3 months ago due to cost. I last days he has been wheezing and progressively becoming more short of breath. He denies any symptoms of an upper respiratory infection. He denies any rhinorrhea or sore throat or otalgia. He has had a cough. He feels tight in his chest. He feels like he cannot catch his breath. He's been using nebulizers every 6 hours benefit however the symptoms returned shortly after he uses them. The cough is nonproductive. He denies any hemoptysis. He denies any chest pain.  At that time, my plan was: 1. Asthma with acute exacerbation Continue albuterol nebulizers every 6 hours. Add prednisone 60 mg by mouth daily for 5 days. I warned the patient that this cause hypoglycemia. I recommended he push fluids try to help prevent dehydration. I also told the patient to resume Advair 250/50 one inhalation twice a day. I gave the patient one month of samples. Quiting his preventative medicine likely caused this exacerbation. I educated the patient as to the purpose of preventative medicines - predniSONE (DELTASONE) 20 MG tablet; 3 tabs poqday x 5 days  Dispense: 15 tablet; Refill: 0  09/08/13 He is here today for followup. He is been compliant with taking his Advair 250/50 one inhalation twice a day. His breathing is much better. He denies any coughing, wheezing, or shortness of breath. He is asking if he can resume driving. He is attended by his daughter who complains of still short-term memory loss after his stroke and occasional confusion. She was also concerned his reaction time is suppressed. Past Medical History  Diagnosis Date  . COPD (chronic obstructive pulmonary disease)   . Hx-TIA (transient ischemic attack)   . Diabetes mellitus   . Hypertension   . Hyperlipidemia   . Anxiety   . CVA (cerebral vascular accident)    Current Outpatient  Prescriptions on File Prior to Visit  Medication Sig Dispense Refill  . albuterol (PROVENTIL) (2.5 MG/3ML) 0.083% nebulizer solution Take 2.5 mg by nebulization every 6 (six) hours as needed. Shortness of breath.      [name] Kitchen amLODipine (NORVASC) 2.5 MG tablet TAKE 1 TABLET (2.5 MG TOTAL) BY MOUTH DAILY.  30 tablet  3  . aspirin 81 MG tablet Take 81 mg by mouth daily.      . clopidogrel (PLAVIX) 75 MG tablet Take 1 tablet (75 mg total) by mouth daily.  30 tablet  3  . Fluticasone-Salmeterol (ADVAIR) 250-50 MCG/DOSE AEPB Inhale 1 puff into the lungs every 12 (twelve) hours.  60 each  11  . glucose blood test strip       . Lancets 30G MISC       . metFORMIN (GLUCOPHAGE) 1000 MG tablet Take 2 tablets by mouth daily.      [name] Kitchen olmesartan (BENICAR) 40 MG tablet Take 40 mg by mouth daily.      . pioglitazone (ACTOS) 30 MG tablet Take 1 tablet (30 mg total) by mouth daily.  30 tablet  5  . pravastatin (PRAVACHOL) 40 MG tablet TAKE 1 TABLET BY MOTUH EVERY EVENING AT BEDTIME. NEEDS OFFICE VISIT  30 tablet  1  . sitaGLIPtin (JANUVIA) 100 MG tablet Take 1 tablet (100 mg total) by mouth daily.  30 tablet  5   No current facility-administered medications on file prior to visit.   Allergies  Allergen Reactions  .  Ace Inhibitors    History   Social History  . Marital Status: Married    Spouse Name: [name]    Number of Children: 6  . Years of Education: 11th   Occupational History  . Retired    Social History Main Topics  . Smoking status: Never Smoker   . Smokeless tobacco: Never Used  . Alcohol Use: No  . Drug Use: No  . Sexual Activity: Not on file   Other Topics Concern  . Not on file   Social History Narrative   Patient lives at home with family.   Caffeine Use: 1 cup of coffee      Review of Systems  All other systems reviewed and are negative.       Objective:   Physical Exam  Vitals reviewed. Constitutional: He appears well-developed and well-nourished. No distress.  HENT:    Right Ear: External ear normal.  Left Ear: External ear normal.  Nose: Nose normal.  Mouth/Throat: Oropharynx is clear and moist. No oropharyngeal exudate.  Eyes: Conjunctivae and EOM are normal. Pupils are equal, round, and reactive to light. Right eye exhibits no discharge. Left eye exhibits no discharge. No scleral icterus.  Neck: Normal range of motion. Neck supple. No JVD present. No tracheal deviation present. No thyromegaly present.  Cardiovascular: Normal rate, regular rhythm, normal heart sounds and intact distal pulses.  Exam reveals no gallop and no friction rub.   No murmur heard. Pulmonary/Chest: Effort normal. No stridor. No respiratory distress.  Musculoskeletal: He exhibits no edema.  Lymphadenopathy:    He has no cervical adenopathy.  Skin: He is not diaphoretic.          Assessment & Plan:  1. Asthma with acute exacerbation Patient's asthma seems well controlled. I was adamant that he needs to continue the Advair 250/50 one inhalation twice a day long-term. I will be glad to refill his medication whenever he needs it. The remainder of his exam is normal. 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  He does not want to proceed with this at the present time.

## 2013-10-22 ENCOUNTER — Other Ambulatory Visit: Payer: Self-pay | Admitting: Family Medicine

## 2013-11-17 ENCOUNTER — Ambulatory Visit: Payer: Medicare Other | Admitting: Diagnostic Neuroimaging

## 2013-11-24 ENCOUNTER — Encounter (HOSPITAL_COMMUNITY): Payer: Self-pay | Admitting: Emergency Medicine

## 2013-11-24 ENCOUNTER — Emergency Department (HOSPITAL_COMMUNITY): Payer: Medicare Other

## 2013-11-24 ENCOUNTER — Inpatient Hospital Stay (HOSPITAL_COMMUNITY)
Admission: EM | Admit: 2013-11-24 | Discharge: 2013-11-26 | DRG: 186 | Disposition: A | Discharge status: Home / Self Care | Payer: Medicare Other | Attending: Internal Medicine | Admitting: Internal Medicine

## 2013-11-24 ENCOUNTER — Inpatient Hospital Stay (HOSPITAL_COMMUNITY): Payer: Medicare Other

## 2013-11-24 DIAGNOSIS — E119 Type 2 diabetes mellitus without complications: Secondary | ICD-10-CM | POA: Diagnosis present

## 2013-11-24 DIAGNOSIS — Z87891 Personal history of nicotine dependence: Secondary | ICD-10-CM

## 2013-11-24 DIAGNOSIS — Z7982 Long term (current) use of aspirin: Secondary | ICD-10-CM

## 2013-11-24 DIAGNOSIS — J189 Pneumonia, unspecified organism: Secondary | ICD-10-CM | POA: Diagnosis present

## 2013-11-24 DIAGNOSIS — J449 Chronic obstructive pulmonary disease, unspecified: Secondary | ICD-10-CM

## 2013-11-24 DIAGNOSIS — J9383 Other pneumothorax: Secondary | ICD-10-CM | POA: Diagnosis present

## 2013-11-24 DIAGNOSIS — Z8673 Personal history of transient ischemic attack (TIA), and cerebral infarction without residual deficits: Secondary | ICD-10-CM

## 2013-11-24 DIAGNOSIS — J4489 Other specified chronic obstructive pulmonary disease: Secondary | ICD-10-CM | POA: Diagnosis present

## 2013-11-24 DIAGNOSIS — Z8249 Family history of ischemic heart disease and other diseases of the circulatory system: Secondary | ICD-10-CM

## 2013-11-24 DIAGNOSIS — I10 Essential (primary) hypertension: Secondary | ICD-10-CM | POA: Diagnosis present

## 2013-11-24 DIAGNOSIS — I152 Hypertension secondary to endocrine disorders: Secondary | ICD-10-CM | POA: Diagnosis present

## 2013-11-24 DIAGNOSIS — Z9981 Dependence on supplemental oxygen: Secondary | ICD-10-CM

## 2013-11-24 DIAGNOSIS — E785 Hyperlipidemia, unspecified: Secondary | ICD-10-CM | POA: Diagnosis present

## 2013-11-24 DIAGNOSIS — E1159 Type 2 diabetes mellitus with other circulatory complications: Secondary | ICD-10-CM | POA: Diagnosis present

## 2013-11-24 DIAGNOSIS — J90 Pleural effusion, not elsewhere classified: Principal | ICD-10-CM | POA: Diagnosis present

## 2013-11-24 DIAGNOSIS — N179 Acute kidney failure, unspecified: Secondary | ICD-10-CM | POA: Diagnosis present

## 2013-11-24 DIAGNOSIS — Z7902 Long term (current) use of antithrombotics/antiplatelets: Secondary | ICD-10-CM

## 2013-11-24 LAB — CBC WITH DIFFERENTIAL/PLATELET
BASOS ABS: 0 10*3/uL (ref 0.0–0.1)
Basophils Relative: 0 % (ref 0–1)
EOS ABS: 0.1 10*3/uL (ref 0.0–0.7)
EOS PCT: 2 % (ref 0–5)
HCT: 33.5 % — ABNORMAL LOW (ref 39.0–52.0)
Hemoglobin: 11.7 g/dL — ABNORMAL LOW (ref 13.0–17.0)
Lymphocytes Relative: 26 % (ref 12–46)
Lymphs Abs: 1.9 10*3/uL (ref 0.7–4.0)
MCH: 30.3 pg (ref 26.0–34.0)
MCHC: 34.9 g/dL (ref 30.0–36.0)
MCV: 86.8 fL (ref 78.0–100.0)
Monocytes Absolute: 0.5 10*3/uL (ref 0.1–1.0)
Monocytes Relative: 7 % (ref 3–12)
Neutro Abs: 4.7 10*3/uL (ref 1.7–7.7)
Neutrophils Relative %: 65 % (ref 43–77)
PLATELETS: 229 10*3/uL (ref 150–400)
RBC: 3.86 MIL/uL — ABNORMAL LOW (ref 4.22–5.81)
RDW: 12.9 % (ref 11.5–15.5)
WBC: 7.3 10*3/uL (ref 4.0–10.5)

## 2013-11-24 LAB — BASIC METABOLIC PANEL
BUN: 27 mg/dL — ABNORMAL HIGH (ref 6–23)
CALCIUM: 8.8 mg/dL (ref 8.4–10.5)
CO2: 22 mEq/L (ref 19–32)
Chloride: 103 mEq/L (ref 96–112)
Creatinine, Ser: 1.63 mg/dL — ABNORMAL HIGH (ref 0.50–1.35)
GFR calc Af Amer: 46 mL/min — ABNORMAL LOW (ref >90)
GFR, EST NON AFRICAN AMERICAN: 40 mL/min — AB (ref >90)
Glucose, Bld: 161 mg/dL — ABNORMAL HIGH (ref 70–99)
Potassium: 3.8 mEq/L (ref 3.7–5.3)
Sodium: 138 mEq/L (ref 137–147)

## 2013-11-24 LAB — GLUCOSE, CAPILLARY
Glucose-Capillary: 191 mg/dL — ABNORMAL HIGH (ref 70–99)
Glucose-Capillary: 170 mg/dL — ABNORMAL HIGH (ref 70–99)

## 2013-11-24 LAB — HEPATIC FUNCTION PANEL
ALBUMIN: 3.4 g/dL — AB (ref 3.5–5.2)
ALT: 8 U/L (ref 0–53)
AST: 13 U/L (ref 0–37)
Alkaline Phosphatase: 60 U/L (ref 39–117)
Bilirubin, Direct: <0.2 mg/dL (ref 0.0–0.3)
Total Bilirubin: 0.4 mg/dL (ref 0.3–1.2)
Total Protein: 7.1 g/dL (ref 6.0–8.3)

## 2013-11-24 LAB — TROPONIN I: Troponin I: <0.3 ng/mL (ref <0.30)

## 2013-11-24 LAB — PRO B NATRIURETIC PEPTIDE: Pro B Natriuretic peptide (BNP): 356.7 pg/mL (ref 0–450)

## 2013-11-24 LAB — LACTATE DEHYDROGENASE: LDH: 203 U/L (ref 94–250)

## 2013-11-24 MED ORDER — ACETAMINOPHEN 650 MG RE SUPP: 650.0000 mg | Freq: Four times a day (QID) | RECTAL | Status: DC | PRN

## 2013-11-24 MED ORDER — ONDANSETRON HCL 4 MG PO TABS: 4.0000 mg | ORAL_TABLET | Freq: Four times a day (QID) | ORAL | Status: DC | PRN

## 2013-11-24 MED ORDER — ALBUTEROL SULFATE (2.5 MG/3ML) 0.083% IN NEBU: 2.5000 mg | INHALATION_SOLUTION | Freq: Four times a day (QID) | RESPIRATORY_TRACT | Status: DC

## 2013-11-24 MED ORDER — SODIUM CHLORIDE 0.9 % IV SOLN
250.0000 mL | INTRAVENOUS | Status: DC | PRN
  Administered 2013-11-24: 250 mL via INTRAVENOUS

## 2013-11-24 MED ORDER — SODIUM CHLORIDE 0.9 % IJ SOLN
3.0000 mL | Freq: Two times a day (BID) | INTRAMUSCULAR | Status: DC
  Administered 2013-11-24 – 2013-11-26 (×4): 3 mL via INTRAVENOUS

## 2013-11-24 MED ORDER — ALBUTEROL SULFATE (2.5 MG/3ML) 0.083% IN NEBU: 2.5000 mg | INHALATION_SOLUTION | RESPIRATORY_TRACT | Status: DC | PRN

## 2013-11-24 MED ORDER — ENOXAPARIN SODIUM 40 MG/0.4ML ~~LOC~~ SOLN
40.0000 mg | SUBCUTANEOUS | Status: DC
  Administered 2013-11-24 – 2013-11-25 (×2): 40 mg via SUBCUTANEOUS
  Filled 2013-11-24 (×3): qty 0.4

## 2013-11-24 MED ORDER — ASPIRIN EC 81 MG PO TBEC
81.0000 mg | DELAYED_RELEASE_TABLET | Freq: Every day | ORAL | Status: DC
  Administered 2013-11-24 – 2013-11-26 (×3): 81 mg via ORAL
  Filled 2013-11-24 (×3): qty 1

## 2013-11-24 MED ORDER — INSULIN ASPART 100 UNIT/ML ~~LOC~~ SOLN
0.0000 [IU] | Freq: Three times a day (TID) | SUBCUTANEOUS | Status: DC
  Administered 2013-11-24 – 2013-11-26 (×5): 2 [IU] via SUBCUTANEOUS

## 2013-11-24 MED ORDER — DEXTROSE 5 % IV SOLN
1.0000 g | INTRAVENOUS | Status: DC
  Administered 2013-11-24 – 2013-11-26 (×3): 1 g via INTRAVENOUS
  Filled 2013-11-24 (×4): qty 10

## 2013-11-24 MED ORDER — SODIUM CHLORIDE 0.9 % IJ SOLN
3.0000 mL | INTRAMUSCULAR | Status: DC | PRN
  Administered 2013-11-25: 3 mL via INTRAVENOUS

## 2013-11-24 MED ORDER — HYDROCODONE-ACETAMINOPHEN 5-325 MG PO TABS: 1.0000 | ORAL_TABLET | ORAL | Status: DC | PRN

## 2013-11-24 MED ORDER — IPRATROPIUM-ALBUTEROL 0.5-2.5 (3) MG/3ML IN SOLN
3.0000 mL | Freq: Three times a day (TID) | RESPIRATORY_TRACT | Status: DC
  Administered 2013-11-25 – 2013-11-26 (×5): 3 mL via RESPIRATORY_TRACT
  Filled 2013-11-24 (×5): qty 3

## 2013-11-24 MED ORDER — DOCUSATE SODIUM 100 MG PO CAPS
100.0000 mg | ORAL_CAPSULE | Freq: Two times a day (BID) | ORAL | Status: DC
  Administered 2013-11-24 – 2013-11-26 (×5): 100 mg via ORAL
  Filled 2013-11-24 (×5): qty 1

## 2013-11-24 MED ORDER — IPRATROPIUM-ALBUTEROL 0.5-2.5 (3) MG/3ML IN SOLN
3.0000 mL | RESPIRATORY_TRACT | Status: DC
  Administered 2013-11-24: 3 mL via RESPIRATORY_TRACT
  Filled 2013-11-24: qty 3

## 2013-11-24 MED ORDER — ACETAMINOPHEN 325 MG PO TABS: 650.0000 mg | ORAL_TABLET | Freq: Four times a day (QID) | ORAL | Status: DC | PRN

## 2013-11-24 MED ORDER — AZITHROMYCIN 500 MG IV SOLR
500.0000 mg | INTRAVENOUS | Status: DC
  Administered 2013-11-24 – 2013-11-25 (×2): 500 mg via INTRAVENOUS
  Filled 2013-11-24 (×3): qty 500

## 2013-11-24 MED ORDER — ONDANSETRON HCL 4 MG/2ML IJ SOLN: 4.0000 mg | Freq: Four times a day (QID) | INTRAMUSCULAR | Status: DC | PRN

## 2013-11-24 MED ORDER — IPRATROPIUM BROMIDE 0.02 % IN SOLN: 0.5000 mg | Freq: Four times a day (QID) | RESPIRATORY_TRACT | Status: DC

## 2013-11-24 MED ORDER — IPRATROPIUM-ALBUTEROL 0.5-2.5 (3) MG/3ML IN SOLN
3.0000 mL | Freq: Once | RESPIRATORY_TRACT | Status: AC
  Administered 2013-11-24: 3 mL via RESPIRATORY_TRACT
  Filled 2013-11-24: qty 3

## 2013-11-24 NOTE — ED Notes (Addendum)
ER Md in room at this time - Family at bedside, pt sitting up on stretcher alert and orientated . No co voiced at this time

## 2013-11-24 NOTE — ED Provider Notes (Signed)
CSN: [id number]     Arrival date & time 11/24/13  [id number] History  This chart was scribed for Mervin Kung, MD by Ladene Artist, ED Scribe. The patient was seen in room APA04/APA04. Patient's care was started at 8:51 AM.     Chief Complaint  Patient presents with  . Shortness of Breath    Patient is a 78 y.o. male presenting with shortness of breath. The history is provided by the patient. No language interpreter was used.  Shortness of Breath Severity:  Severe Duration:  3 days Timing:  Intermittent Chronicity:  Recurrent Associated symptoms: chest pain and cough   Associated symptoms: no abdominal pain, no fever, no headaches, no neck pain, no rash, no sore throat and no vomiting    HPI Comments: ALESSIO MCQUILLIN is a 78 y.o. male, with a h/o COPD, who presents to the Emergency Department complaining of shortness of breath onset 3-4 days with associated productive cough, wheezing, chest pain that resolved while sleeping. Pt was seen by PCP 3 days ago and started Prednisone 60mg  for 3 days, 40mg  for 3 days, as well Septra. Pt ssed nebulizer 6 hours ago. Pt denies wearing O2 at home.   PCP: Jenna Luo  Past Medical History  Diagnosis Date  . COPD (chronic obstructive pulmonary disease)   . Hx-TIA (transient ischemic attack)   . Diabetes mellitus   . Hypertension   . Hyperlipidemia   . Anxiety   . CVA (cerebral vascular accident)    Past Surgical History  Procedure Laterality Date  . Rotator cuff repair    . Cholecystectomy N/A 02/23/2013    Procedure: LAPAROSCOPIC CHOLECYSTECTOMY;  Surgeon: Jamesetta So, MD;  Location: [facility];  Service: General;  Laterality: N/A;  . Ercp N/A 02/24/2013    Procedure: ENDOSCOPIC RETROGRADE CHOLANGIOPANCREATOGRAPHY;  Surgeon: Daneil Dolin, MD;  Location: [facility];  Service: Endoscopy;  Laterality: N/A;   Family History  Problem Relation Age of Onset  . Colon cancer Neg Hx    History  Substance Use Topics  . Smoking status: Never Smoker    . Smokeless tobacco: Never Used  . Alcohol Use: No    Review of Systems  Constitutional: Negative for fever and chills.  HENT: Negative for rhinorrhea and sore throat.   Eyes: Negative for visual disturbance.  Respiratory: Positive for cough and shortness of breath.   Cardiovascular: Positive for chest pain. Negative for leg swelling.  Gastrointestinal: Negative for nausea, vomiting, abdominal pain and diarrhea.  Genitourinary: Negative for dysuria.  Musculoskeletal: Negative for back pain and neck pain.  Skin: Negative for rash.  Neurological: Negative for headaches.  Hematological: Does not bruise/bleed easily.  All other systems reviewed and are negative.   Allergies  Ace inhibitors  Home Medications   Prior to Admission medications   Medication Sig Start Date End Date Taking? Authorizing Provider  albuterol (PROVENTIL) (2.5 MG/3ML) 0.083% nebulizer solution Take 2.5 mg by nebulization every 6 (six) hours as needed. Shortness of breath.    Historical Provider, MD  amLODipine (NORVASC) 2.5 MG tablet TAKE 1 TABLET (2.5 MG TOTAL) BY MOUTH DAILY. 10/22/13   Susy Frizzle, MD  aspirin 81 MG tablet Take 81 mg by mouth daily.    Historical Provider, MD  clopidogrel (PLAVIX) 75 MG tablet Take 1 tablet (75 mg total) by mouth daily. 05/07/13   Susy Frizzle, MD  Fluticasone-Salmeterol (ADVAIR) 250-50 MCG/DOSE AEPB Inhale 1 puff into the lungs every 12 (twelve) hours. 11/10/12  Susy Frizzle, MD  glucose blood test strip  04/28/13   Historical Provider, MD  Lancets 30G MISC  04/28/13   Historical Provider, MD  metFORMIN (GLUCOPHAGE) 1000 MG tablet Take 2 tablets by mouth daily. 05/11/13   Historical Provider, MD  olmesartan (BENICAR) 40 MG tablet Take 40 mg by mouth daily.    Historical Provider, MD  pioglitazone (ACTOS) 30 MG tablet Take 1 tablet (30 mg total) by mouth daily. 03/12/13   Susy Frizzle, MD  pravastatin (PRAVACHOL) 40 MG tablet TAKE 1 TABLET BY MOTUH EVERY EVENING  AT BEDTIME. NEEDS OFFICE VISIT 04/21/13   Susy Frizzle, MD  sitaGLIPtin (JANUVIA) 100 MG tablet Take 1 tablet (100 mg total) by mouth daily. 10/14/12   Susy Frizzle, MD   Triage Vitals: BP 141/99  Temp(Src) 97.9 F (36.6 C) (Oral)  Resp 20  Ht 6' (1.829 m)  Wt 215 lb (97.523 kg)  BMI 29.15 kg/m2  SpO2 98% Physical Exam  Nursing note and vitals reviewed. Constitutional: He is oriented to person, place, and time. He appears well-developed and well-nourished. No distress.  HENT:  Head: Normocephalic and atraumatic.  Eyes: EOM are normal.  Neck: Neck supple.  Cardiovascular: Normal rate.   Pulmonary/Chest: Effort normal. No respiratory distress.  Abdominal: Bowel sounds are normal. There is no tenderness.  Musculoskeletal: Normal range of motion. He exhibits no edema.  Neurological: He is alert and oriented to person, place, and time. No cranial nerve deficit. He exhibits normal muscle tone. Coordination normal.  Skin: Skin is warm and dry.  Psychiatric: He has a normal mood and affect. His behavior is normal.    ED Course  Procedures (including critical care time) DIAGNOSTIC STUDIES: Oxygen Saturation is 98% on RA, normal by my interpretation.    COORDINATION OF CARE: 8:58 AM-Discussed treatment plan which includes CXR, EKG and breathing treatment with pt at bedside and pt agreed to plan.   Labs Review Labs Reviewed  CBC WITH DIFFERENTIAL - Abnormal; Notable for the following:    RBC 3.86 (*)    Hemoglobin 11.7 (*)    HCT 33.5 (*)    All other components within normal limits  BASIC METABOLIC PANEL - Abnormal; Notable for the following:    Glucose, Bld 161 (*)    BUN 27 (*)    Creatinine, Ser 1.63 (*)    GFR calc non Af Amer 40 (*)    GFR calc Af Amer 46 (*)    All other components within normal limits  TROPONIN I  PRO B NATRIURETIC PEPTIDE   Results for orders placed during the hospital encounter of 11/24/13  CBC WITH DIFFERENTIAL      Result Value Ref Range    WBC 7.3  4.0 - 10.5 K/uL   RBC 3.86 (*) 4.22 - 5.81 MIL/uL   Hemoglobin 11.7 (*) 13.0 - 17.0 g/dL   HCT 33.5 (*) 39.0 - 52.0 %   MCV 86.8  78.0 - 100.0 fL   MCH 30.3  26.0 - 34.0 pg   MCHC 34.9  30.0 - 36.0 g/dL   RDW 12.9  11.5 - 15.5 %   Platelets 229  150 - 400 K/uL   Neutrophils Relative % 65  43 - 77 %   Neutro Abs 4.7  1.7 - 7.7 K/uL   Lymphocytes Relative 26  12 - 46 %   Lymphs Abs 1.9  0.7 - 4.0 K/uL   Monocytes Relative 7  3 - 12 %  Monocytes Absolute 0.5  0.1 - 1.0 K/uL   Eosinophils Relative 2  0 - 5 %   Eosinophils Absolute 0.1  0.0 - 0.7 K/uL   Basophils Relative 0  0 - 1 %   Basophils Absolute 0.0  0.0 - 0.1 K/uL  BASIC METABOLIC PANEL      Result Value Ref Range   Sodium 138  137 - 147 mEq/L   Potassium 3.8  3.7 - 5.3 mEq/L   Chloride 103  96 - 112 mEq/L   CO2 22  19 - 32 mEq/L   Glucose, Bld 161 (*) 70 - 99 mg/dL   BUN 27 (*) 6 - 23 mg/dL   Creatinine, Ser 1.63 (*) 0.50 - 1.35 mg/dL   Calcium 8.8  8.4 - 10.5 mg/dL   GFR calc non Af Amer 40 (*) >90 mL/min   GFR calc Af Amer 46 (*) >90 mL/min  TROPONIN I      Result Value Ref Range   Troponin I <0.30  <0.30 ng/mL  PRO B NATRIURETIC PEPTIDE      Result Value Ref Range   Pro B Natriuretic peptide (BNP) 356.7  0 - 450 pg/mL     Imaging Review Dg Chest 2 View  11/24/2013   CLINICAL DATA:  Shortness of breath.  EXAM: CHEST  2 VIEW  COMPARISON:  PA and lateral chest x-ray of March 24, 2013.  FINDINGS: Since the previous study there has developed a moderate-sized right pleural effusion as well as parenchymal consolidation in the lower lobe. The right upper lobe is well-expanded. The left lung is clear. The mediastinum is not shifted. The visualized portions of the right heart border appear normal. The left heart border is normal. The pulmonary vascularity is not engorged. The observed portions of the bony thorax exhibit no acute abnormalities.  IMPRESSION: Since the previous study the patient has developed a  moderate-sized right pleural effusion as well as right lower lobe parenchymal consolidation. The left lung is clear. Further evaluation of the thorax now with CT scanning is recommended.   Electronically Signed   By: David  Martinique   On: 11/24/2013 10:02   Ct Chest Wo Contrast  11/24/2013   CLINICAL DATA:  Shortness of breath, abnormal chest x-ray, elevated serum creatinine  EXAM: CT CHEST WITHOUT CONTRAST  TECHNIQUE: Multidetector CT imaging of the chest was performed following the standard protocol without IV contrast.  COMPARISON:  Chest x-ray dated Nov 24, 2013.  FINDINGS: There is a moderate to large right pleural effusion layering posteriorly. There is an area of parenchymal consolidation in the anterior aspect of the right lower lobe adjacent to the pleural effusion. The left lung is clear. There is no left pleural effusion. The cardiac silhouette is normal in size. There is no pericardial effusion. There is coronary artery calcification. The caliber of the thoracic aorta is normal. There is a small hiatal hernia. The retrosternal soft tissues appear normal. No pathologic sized mediastinal or hilar lymph nodes are demonstrated.  Within the upper abdomen the observed portions of the liver and spleen appear normal.  The thoracic vertebral bodies are preserved in height. The sternum and visualized portions of the ribs exhibit no acute abnormalities.  IMPRESSION: 1. There is a moderate to large right pleural effusion layering posteriorly. Consolidated right lower lobe lies along the anterior aspect of the effusion. The right upper and much of the right middle lobe is clear. The left lung is clear. 2. There is no mediastinal nor hilar  lymphadenopathy. 3. There are coronary artery calcifications present but no evidence of CHF.   Electronically Signed   By: David  Martinique   On: 11/24/2013 11:25     EKG Interpretation   Date/Time:  Tuesday Nov 24 2013 09:10:42 EDT Ventricular Rate:  73 PR Interval:  53 QRS  Duration: 109 QT Interval:  385 QTC Calculation: 424 R Axis:   42 Text Interpretation:  Sinus rhythm Ventricular premature complex Short PR  interval Baseline wander in lead(s) V5 no evidence of inferior infarct  present today Artifact Confirmed by Anastasio Wogan  MD, Tasia Liz ([id number]) on  11/24/2013 9:14:59 AM      MDM   Final diagnoses:  Pleural effusion  COPD (chronic obstructive pulmonary disease)    Patient presents with shortness of breath and cough ongoing for 3-4 days. That seen by his primary care doctor and was started on Septra and prednisone. Currently taking 60 mg a day. Persistent shortness of breath so came in for further evaluation.  Workup here shows a fairly significant right-sided pleural effusion etiology of this is not clear. Probably represents patient's shortness of breath problem. Patient given duo nebs here without any improvement. Patient's satting around 93%. Patient normally on oxygen at home. We'll discuss with hospitalist regarding admission for the pleural effusion. CT was done to rule out obvious mass or adenopathy does not show that. No evidence of pneumonia no evidence of neoplastic process at this point in time.  I personally performed the services described in this documentation, which was scribed in my presence. The recorded information has been reviewed and is accurate.    Mervin Kung, MD 11/24/13 1146

## 2013-11-24 NOTE — H&P (Signed)
[facility] History and Physical  [name] FYB:[id number] DOB: 05/03/1936 DOA: 11/24/2013  Referring physician: Dr Rogene Houston.  PCP: Odette Fraction, MD   Chief Complaint: Cough, SOB  HPI: [name] is a 78 y.o. male with PMH significant for TIA, Diabetes, Hypertension, COPD, prior history of smoking more than 40 years ago, who presents complaining of persistent cough, and SOB. He was seen in the urgent care few days ago for the same. He was prescribe prednisone, nebulizer and bactrim. He has not notice improvement. He also relates chest pain at times. He denies fever, weight loss.    Review of Systems:  Negative, except as per HPI.   Past Medical History  Diagnosis Date  . COPD (chronic obstructive pulmonary disease)   . Hx-TIA (transient ischemic attack)   . Diabetes mellitus   . Hypertension   . Hyperlipidemia   . Anxiety   . CVA (cerebral vascular accident)    Past Surgical History  Procedure Laterality Date  . Rotator cuff repair    . Cholecystectomy N/A 02/23/2013    Procedure: LAPAROSCOPIC CHOLECYSTECTOMY;  Surgeon: Jamesetta So, MD;  Location: [facility];  Service: General;  Laterality: N/A;  . Ercp N/A 02/24/2013    Procedure: ENDOSCOPIC RETROGRADE CHOLANGIOPANCREATOGRAPHY;  Surgeon: Daneil Dolin, MD;  Location: [facility];  Service: Endoscopy;  Laterality: N/A;   Social History:  reports that he has never smoked. He has never used smokeless tobacco. He reports that he does not drink alcohol or use illicit drugs.  Allergies  Allergen Reactions  . Ace Inhibitors     Family History  Problem Relation Age of Onset  . Colon cancer Neg Hx      Prior to Admission medications   Medication Sig Start Date End Date Taking? Authorizing Provider  amLODipine (NORVASC) 2.5 MG tablet TAKE 1 TABLET (2.5 MG TOTAL) BY MOUTH DAILY. 10/22/13  Yes Susy Frizzle, MD  aspirin EC 81 MG tablet Take 81 mg by mouth daily.   Yes Historical Provider, MD    Fluticasone-Salmeterol (ADVAIR) 250-50 MCG/DOSE AEPB Inhale 1 puff into the lungs every 12 (twelve) hours. 11/10/12  Yes Susy Frizzle, MD  ipratropium (ATROVENT) 0.02 % nebulizer solution Take 0.5 mg by nebulization 4 (four) times daily.   Yes Historical Provider, MD  pioglitazone (ACTOS) 30 MG tablet Take 1 tablet (30 mg total) by mouth daily. 03/12/13  Yes Susy Frizzle, MD  predniSONE (DELTASONE) 20 MG tablet Take 20 mg by mouth See admin instructions. 3 tabs x 3 days; 2 tabs x 3 days, 1 tab x 3 days.  Starting 11/21/2013.   Yes Historical Provider, MD  sulfamethoxazole-trimethoprim (BACTRIM DS) 800-160 MG per tablet Take 1 tablet by mouth 2 (two) times daily. Starting 11/21/2013 x 10 days.   Yes Historical Provider, MD   Physical Exam: Filed Vitals:   11/24/13 1230  BP: 103/80  Pulse: 73  Temp:   Resp: 15    BP 103/80  Pulse 73  Temp(Src) 97.9 F (36.6 C) (Oral)  Resp 15  Ht 6' (1.829 m)  Wt 97.523 kg (215 lb)  BMI 29.15 kg/m2  SpO2 99%  General:  Appears calm and comfortable Eyes: PERRL, normal lids, irises & conjunctiva ENT: grossly normal hearing, lips & tongue Neck: no LAD, masses or thyromegaly Cardiovascular: RRR, no m/r/g. No LE edema. Telemetry: SR, no arrhythmias  Respiratory: no wheezing, bilateral crackles,  Normal respiratory effort. Abdomen: soft, ntnd Skin: no rash or induration seen on limited  exam Musculoskeletal: grossly normal tone BUE/BLE Neurologic: grossly non-focal.          Labs on Admission:  Basic Metabolic Panel:  Recent Labs Lab 11/24/13 0923  NA 138  K 3.8  CL 103  CO2 22  GLUCOSE 161*  BUN 27*  CREATININE 1.63*  CALCIUM 8.8   Liver Function Tests: No results found for this basename: AST, ALT, ALKPHOS, BILITOT, PROT, ALBUMIN,  in the last 168 hours No results found for this basename: LIPASE, AMYLASE,  in the last 168 hours No results found for this basename: AMMONIA,  in the last 168 hours CBC:  Recent Labs Lab  11/24/13 0923  WBC 7.3  NEUTROABS 4.7  HGB 11.7*  HCT 33.5*  MCV 86.8  PLT 229   Cardiac Enzymes:  Recent Labs Lab 11/24/13 0923  TROPONINI <0.30    BNP (last 3 results)  Recent Labs  11/24/13 0923  PROBNP 356.7   CBG: No results found for this basename: GLUCAP,  in the last 168 hours  Radiological Exams on Admission: Dg Chest 2 View  11/24/2013   CLINICAL DATA:  Shortness of breath.  EXAM: CHEST  2 VIEW  COMPARISON:  PA and lateral chest x-ray of March 24, 2013.  FINDINGS: Since the previous study there has developed a moderate-sized right pleural effusion as well as parenchymal consolidation in the lower lobe. The right upper lobe is well-expanded. The left lung is clear. The mediastinum is not shifted. The visualized portions of the right heart border appear normal. The left heart border is normal. The pulmonary vascularity is not engorged. The observed portions of the bony thorax exhibit no acute abnormalities.  IMPRESSION: Since the previous study the patient has developed a moderate-sized right pleural effusion as well as right lower lobe parenchymal consolidation. The left lung is clear. Further evaluation of the thorax now with CT scanning is recommended.   Electronically Signed   By: David  Martinique   On: 11/24/2013 10:02   Ct Chest Wo Contrast  11/24/2013   CLINICAL DATA:  Shortness of breath, abnormal chest x-ray, elevated serum creatinine  EXAM: CT CHEST WITHOUT CONTRAST  TECHNIQUE: Multidetector CT imaging of the chest was performed following the standard protocol without IV contrast.  COMPARISON:  Chest x-ray dated Nov 24, 2013.  FINDINGS: There is a moderate to large right pleural effusion layering posteriorly. There is an area of parenchymal consolidation in the anterior aspect of the right lower lobe adjacent to the pleural effusion. The left lung is clear. There is no left pleural effusion. The cardiac silhouette is normal in size. There is no pericardial effusion.  There is coronary artery calcification. The caliber of the thoracic aorta is normal. There is a small hiatal hernia. The retrosternal soft tissues appear normal. No pathologic sized mediastinal or hilar lymph nodes are demonstrated.  Within the upper abdomen the observed portions of the liver and spleen appear normal.  The thoracic vertebral bodies are preserved in height. The sternum and visualized portions of the ribs exhibit no acute abnormalities.  IMPRESSION: 1. There is a moderate to large right pleural effusion layering posteriorly. Consolidated right lower lobe lies along the anterior aspect of the effusion. The right upper and much of the right middle lobe is clear. The left lung is clear. 2. There is no mediastinal nor hilar lymphadenopathy. 3. There are coronary artery calcifications present but no evidence of CHF.   Electronically Signed   By: David  Martinique   On: 11/24/2013 11:25  EKG: Independently reviewed. Sinus Rhythm.   Assessment/Plan Active Problems:   Hypertension   DM (diabetes mellitus)   Acute renal failure   Pleural effusion on right   PNA (pneumonia)   Pleural effusion  1-Dyspnea; likely related to pleural effusion, PNA.   2-Right side pleural effusion and Consolidation: Differential PNA, malignancy. Reviewed CT with radiologist pleural effusion is not loculated. I will order thoracentesis therapeutic and diagnostic.  Pleural fluid analysis for protein, culture, LDH, gram stain, cytology. Will check BNP. I will start Ceftriaxone and Azithromycin. Further evaluation depending on pleural fluid analysis results.   3-Chest pain: probably related to lung process. He has multiple risk factors. I will cycle cardiac enzymes.   4-Acute renal insufficiency; I will not start IV fluids to avoid worsening pleural effusion. Avoid nephrotoxins. Repeat renal function in am.   5-History of COPD; continue with nebulizer. No wheezing on lung exam. I will not start prednisone.    Code  Status: Full Code.  Family Communication: Care discussed with patient, wife and daughter who were at bedside.  Disposition Plan: Expect 2 to 3 days inpatient.   Time spent: 75 minutes.   [facility] Pager [phone]

## 2013-11-24 NOTE — ED Notes (Signed)
Sob with non-productive cough x 3-4 days.  Pt reports episode of mid cp this morning, denies n/v, denies cp now.

## 2013-11-24 NOTE — ED Notes (Signed)
Receiving nurse unable to take report at this time, stated that she will call back in approx 10 mins

## 2013-11-24 NOTE — ED Notes (Signed)
Pt back into room from radiology- transferred by A Gilford Rile

## 2013-11-24 NOTE — ED Notes (Signed)
Pt returned to room from CT at this time.

## 2013-11-25 ENCOUNTER — Inpatient Hospital Stay (HOSPITAL_COMMUNITY): Payer: Medicare Other

## 2013-11-25 ENCOUNTER — Encounter (HOSPITAL_COMMUNITY): Payer: Self-pay

## 2013-11-25 DIAGNOSIS — J189 Pneumonia, unspecified organism: Secondary | ICD-10-CM

## 2013-11-25 LAB — BODY FLUID CELL COUNT WITH DIFFERENTIAL
EOS FL: 4 %
Lymphs, Fluid: 74 %
Monocyte-Macrophage-Serous Fluid: 22 % — ABNORMAL LOW (ref 50–90)
NEUTROPHIL FLUID: 0 % (ref 0–25)
Total Nucleated Cell Count, Fluid: 1378 cu mm — ABNORMAL HIGH (ref 0–1000)

## 2013-11-25 LAB — LACTATE DEHYDROGENASE, PLEURAL OR PERITONEAL FLUID: LD, Fluid: 159 U/L — ABNORMAL HIGH (ref 3–23)

## 2013-11-25 LAB — PROTEIN, BODY FLUID: Total protein, fluid: 5.3 g/dL

## 2013-11-25 LAB — GLUCOSE, CAPILLARY
GLUCOSE-CAPILLARY: 169 mg/dL — AB (ref 70–99)
GLUCOSE-CAPILLARY: 175 mg/dL — AB (ref 70–99)
Glucose-Capillary: 113 mg/dL — ABNORMAL HIGH (ref 70–99)
Glucose-Capillary: 137 mg/dL — ABNORMAL HIGH (ref 70–99)

## 2013-11-25 LAB — EXPECTORATED SPUTUM ASSESSMENT W REFEX TO RESP CULTURE

## 2013-11-25 LAB — GLUCOSE, SEROUS FLUID: Glucose, Fluid: 162 mg/dL

## 2013-11-25 LAB — ALBUMIN, FLUID (OTHER): Albumin, Fluid: 3 g/dL

## 2013-11-25 LAB — EXPECTORATED SPUTUM ASSESSMENT W GRAM STAIN, RFLX TO RESP C

## 2013-11-25 LAB — TROPONIN I

## 2013-11-25 NOTE — Procedures (Signed)
PreOperative Dx: RIGHT pleural effusion Postoperative Dx: RIGHT pleural effusion Procedure:   US guided RIGHT thoracentesis Radiologist:  Thornton Papas Anesthesia:  6 ml of 1% lidocaine Specimen:  1630 ml of yellow to amber colored fluid EBL:   < 1 ml Complications: Tiny right pneumothorax on post procedural chest radiograph

## 2013-11-25 NOTE — Care Management Utilization Note (Signed)
UR completed 

## 2013-11-25 NOTE — Progress Notes (Signed)
Puncture site from thoracentesis assessed, right mid back area.  Dressing clean dry and intact. Pt comfortable, in no acute distress. Will continue to monitor.

## 2013-11-25 NOTE — Progress Notes (Signed)
Thoracentesis complete no signs of distress. 1630 ml amber colored pleural fluid removed

## 2013-11-25 NOTE — Progress Notes (Signed)
[facility] PROGRESS NOTE  [name] LKG:[id number] DOB: [birth year] DOA: 11/24/2013 PCP: Odette Fraction, MD  Assessment/Plan: 1. Right-sided pleural effusion. Status post thoracentesis with removal of 1600 cc of fluid. Fluid analysis indicates a possible exudative effusion. Possibly related to pneumonia. CT scan of the chest was done on admission. Cytology is currently pending. We will request a pulmonology consultation to see if further workup is needed. Clinically, the patient appears improved 2. Tiny pneumothorax status post thoracentesis. We'll repeat chest x-ray to ensure resolution. Clinically he does not have any symptoms. 3. Pneumonia. Started on antibiotics for consolidation noted on imaging. Does not appear to have any significant cough at this time. 4. Chest pain. Likely related to lung process. 5. Acute renal failure. Labs have not been repeated today. We'll request repeat labs in a.m. 6. COPD. Continue with bronchodilators. No wheezing on exam today.  Code Status: full code Family Communication: discussed with patient and family at the bedside Disposition Plan: discharge home once improved   Consultants:  pulmonology  Procedures:  Thoracentesis 5/20 with removal of 1630cc of fluid from right chest  Antibiotics:  Rocephin 5/19  azithro 5/19  HPI/Subjective: Feeling better. Reports breathing is improving post thoracentesis. Family reports that he has been coughing more lately.  Objective: Filed Vitals:   11/25/13 1427  BP: 135/76  Pulse: 80  Temp: 97.2 F (36.2 C)  Resp: 20    Intake/Output Summary (Last 24 hours) at 11/25/13 1848 Last data filed at 11/25/13 1537  Gross per 24 hour  Intake    780 ml  Output      0 ml  Net    780 ml   Filed Weights   11/24/13 0839 11/24/13 1338 11/25/13 0540  Weight: 97.523 kg (215 lb) 100.1 kg (220 lb 10.9 oz) 99.7 kg (219 lb 12.8 oz)    Exam:   General:  NAD  Cardiovascular: S1, S2  RRR  Respiratory: CTA B  Abdomen: soft, nt, nd, bs+  Musculoskeletal:  No edema b/l   Data Reviewed: Basic Metabolic Panel:  Recent Labs Lab 11/24/13 0923  NA 138  K 3.8  CL 103  CO2 22  GLUCOSE 161*  BUN 27*  CREATININE 1.63*  CALCIUM 8.8   Liver Function Tests:  Recent Labs Lab 11/24/13 1424  AST 13  ALT 8  ALKPHOS 60  BILITOT 0.4  PROT 7.1  ALBUMIN 3.4*   No results found for this basename: LIPASE, AMYLASE,  in the last 168 hours No results found for this basename: AMMONIA,  in the last 168 hours CBC:  Recent Labs Lab 11/24/13 0923  WBC 7.3  NEUTROABS 4.7  HGB 11.7*  HCT 33.5*  MCV 86.8  PLT 229   Cardiac Enzymes:  Recent Labs Lab 11/24/13 0923 11/24/13 1424 11/24/13 1952 11/25/13 0147  TROPONINI <0.30 <0.30 <0.30 <0.30   BNP (last 3 results)  Recent Labs  11/24/13 0923  PROBNP 356.7   CBG:  Recent Labs Lab 11/24/13 1710 11/24/13 2155 11/25/13 0812 11/25/13 1229 11/25/13 1654  GLUCAP 191* 170* 169* 113* 175*    Recent Results (from the past 240 hour(s))  BODY FLUID CULTURE     Status: None   Collection Time    11/25/13 11:40 AM      Result Value Ref Range Status   Specimen Description FLUID RIGHT LUNG   Final   Special Requests NONE   Final   Gram Stain     Final   Value: WBC PRESENT, PREDOMINANTLY  MONONUCLEAR     NO ORGANISMS SEEN     CYTOSPIN Performed at [facility]     Performed at [facility]   Culture PENDING   Incomplete   Report Status PENDING   Incomplete  CULTURE, EXPECTORATED SPUTUM-ASSESSMENT     Status: None   Collection Time    11/25/13 12:44 PM      Result Value Ref Range Status   Specimen Description SPUTUM EXPECTORATED   Final   Special Requests NONE   Final   Sputum evaluation     Final   Value: MICROSCOPIC FINDINGS SUGGEST THAT THIS SPECIMEN IS NOT REPRESENTATIVE OF LOWER RESPIRATORY SECRETIONS. PLEASE RECOLLECT.     Performed at [facility]   Report Status 11/25/2013  FINAL   Final     Studies: Dg Chest 1 View  11/25/2013   CLINICAL DATA:  RIGHT pleural effusion post thoracentesis with removal of 1630 mL of yellow fluid  EXAM: CHEST - 1 VIEW  COMPARISON:  11/24/2013  FINDINGS: Mild enlargement of cardiac silhouette.  Mediastinal contours and pulmonary vascularity normal.  Markedly decreased RIGHT pleural effusion post thoracentesis.  Tiny pneumothorax along lateral RIGHT chest wall on expiratory radiograph.  Minimal RIGHT basilar atelectasis.  LEFT lung clear.  IMPRESSION: Tiny RIGHT pneumothorax post thoracentesis.  Markedly decreased RIGHT pleural effusion with minimal residual RIGHT basilar atelectasis.  Findings called to Dr. Roderic Palau On 11/25/2013 at 1219 hr.   Electronically Signed   By: Lavonia Dana M.D.   On: 11/25/2013 12:20   Dg Chest 2 View  11/24/2013   CLINICAL DATA:  Shortness of breath.  EXAM: CHEST  2 VIEW  COMPARISON:  PA and lateral chest x-ray of March 24, 2013.  FINDINGS: Since the previous study there has developed a moderate-sized right pleural effusion as well as parenchymal consolidation in the lower lobe. The right upper lobe is well-expanded. The left lung is clear. The mediastinum is not shifted. The visualized portions of the right heart border appear normal. The left heart border is normal. The pulmonary vascularity is not engorged. The observed portions of the bony thorax exhibit no acute abnormalities.  IMPRESSION: Since the previous study the patient has developed a moderate-sized right pleural effusion as well as right lower lobe parenchymal consolidation. The left lung is clear. Further evaluation of the thorax now with CT scanning is recommended.   Electronically Signed   By: David  Martinique   On: 11/24/2013 10:02   Ct Chest Wo Contrast  11/24/2013   CLINICAL DATA:  Shortness of breath, abnormal chest x-ray, elevated serum creatinine  EXAM: CT CHEST WITHOUT CONTRAST  TECHNIQUE: Multidetector CT imaging of the chest was performed  following the standard protocol without IV contrast.  COMPARISON:  Chest x-ray dated Nov 24, 2013.  FINDINGS: There is a moderate to large right pleural effusion layering posteriorly. There is an area of parenchymal consolidation in the anterior aspect of the right lower lobe adjacent to the pleural effusion. The left lung is clear. There is no left pleural effusion. The cardiac silhouette is normal in size. There is no pericardial effusion. There is coronary artery calcification. The caliber of the thoracic aorta is normal. There is a small hiatal hernia. The retrosternal soft tissues appear normal. No pathologic sized mediastinal or hilar lymph nodes are demonstrated.  Within the upper abdomen the observed portions of the liver and spleen appear normal.  The thoracic vertebral bodies are preserved in height. The sternum and visualized portions of the  ribs exhibit no acute abnormalities.  IMPRESSION: 1. There is a moderate to large right pleural effusion layering posteriorly. Consolidated right lower lobe lies along the anterior aspect of the effusion. The right upper and much of the right middle lobe is clear. The left lung is clear. 2. There is no mediastinal nor hilar lymphadenopathy. 3. There are coronary artery calcifications present but no evidence of CHF.   Electronically Signed   By: David  Martinique   On: 11/24/2013 11:25   US Thoracentesis Asp Pleural Space W/img Guide  11/25/2013   CLINICAL DATA:  RIGHT pleural effusion  EXAM: US THORACENTESIS ASP PLEURAL SPACE W/IMG GUIDE DIAGNOSTIC AND THERAPEUTIC  TECHNIQUE: Procedure, benefits, and risks of procedure were discussed with patient.  Written informed consent for procedure was obtained.  Time out protocol followed.  Pleural effusion localized at the posterior RIGHT hemi thorax.  Skin prepped and draped in usual sterile fashion.  Skin and soft tissues anesthetized with 6 mL of 1% lidocaine.  8 French thoracentesis catheter placed into the RIGHT pleural  space.  1630 mL of yellow to amber colored fluid was aspirated by syringe pump.  Fluid sent to laboratory for requested analysis.  Procedure tolerated well by patient without immediate complication.  Postprocedural chest radiograph ordered.  COMPARISON:  Chest radiograph and chest CT 11/24/2013  FINDINGS: As above  IMPRESSION: Ultrasound guided RIGHT thoracentesis with removal of 1630 mL of fluid.   Electronically Signed   By: Lavonia Dana M.D.   On: 11/25/2013 15:53    Scheduled Meds: . aspirin EC  81 mg Oral Daily  . azithromycin  500 mg Intravenous Q24H  . cefTRIAXone (ROCEPHIN)  IV  1 g Intravenous Q24H  . docusate sodium  100 mg Oral BID  . enoxaparin (LOVENOX) injection  40 mg Subcutaneous Q24H  . insulin aspart  0-9 Units Subcutaneous TID WC  . ipratropium-albuterol  3 mL Nebulization TID  . sodium chloride  3 mL Intravenous Q12H   Continuous Infusions:   Active Problems:   Hypertension   DM (diabetes mellitus)   Acute renal failure   Pleural effusion on right   PNA (pneumonia)   Pleural effusion    Time spent: 65mins    Freddy Spadafora  [facility] Pager [phone]. If 7PM-7AM, please contact night-coverage at [email or web address], password [facility] 11/25/2013, 6:48 PM  LOS: 1 day             0

## 2013-11-26 ENCOUNTER — Inpatient Hospital Stay: Payer: Medicare Other | Admitting: Family Medicine

## 2013-11-26 DIAGNOSIS — E119 Type 2 diabetes mellitus without complications: Secondary | ICD-10-CM

## 2013-11-26 DIAGNOSIS — I10 Essential (primary) hypertension: Secondary | ICD-10-CM

## 2013-11-26 LAB — BASIC METABOLIC PANEL
BUN: 23 mg/dL (ref 6–23)
CALCIUM: 8.6 mg/dL (ref 8.4–10.5)
CO2: 23 mEq/L (ref 19–32)
Chloride: 101 mEq/L (ref 96–112)
Creatinine, Ser: 1.33 mg/dL (ref 0.50–1.35)
GFR calc Af Amer: 59 mL/min — ABNORMAL LOW (ref >90)
GFR, EST NON AFRICAN AMERICAN: 51 mL/min — AB (ref >90)
GLUCOSE: 184 mg/dL — AB (ref 70–99)
Potassium: 3.9 mEq/L (ref 3.7–5.3)
Sodium: 136 mEq/L — ABNORMAL LOW (ref 137–147)

## 2013-11-26 LAB — GLUCOSE, CAPILLARY
Glucose-Capillary: 165 mg/dL — ABNORMAL HIGH (ref 70–99)
Glucose-Capillary: 111 mg/dL — ABNORMAL HIGH (ref 70–99)
Glucose-Capillary: 161 mg/dL — ABNORMAL HIGH (ref 70–99)

## 2013-11-26 MED ORDER — AZITHROMYCIN 500 MG PO TABS: ORAL_TABLET | ORAL | Status: DC

## 2013-11-26 MED ORDER — CEFUROXIME AXETIL 500 MG PO TABS: 500.0000 mg | ORAL_TABLET | Freq: Two times a day (BID) | ORAL | Status: DC

## 2013-11-26 MED ORDER — AZITHROMYCIN 250 MG PO TABS
500.0000 mg | ORAL_TABLET | Freq: Every day | ORAL | Status: DC
  Administered 2013-11-26: 500 mg via ORAL
  Filled 2013-11-26: qty 2

## 2013-11-26 NOTE — Progress Notes (Signed)
IV removed. Discharge instructions reviewed with patient, wife, and daughter. Understanding verbalized. Ready for discharge home.

## 2013-11-26 NOTE — Progress Notes (Signed)
PHARMACIST - PHYSICIAN COMMUNICATION DR:   Roderic Palau CONCERNING: Antibiotic IV to Oral Route Change Policy  RECOMMENDATION: This patient is receiving Zithromax by the intravenous route.  Based on criteria approved by the Pharmacy and Therapeutics Committee, the antibiotic(s) is/are being converted to the equivalent oral dose form(s).   DESCRIPTION: These criteria include:  Patient being treated for a respiratory tract infection, urinary tract infection, cellulitis or clostridium difficile associated diarrhea if on metronidazole  The patient is not neutropenic and does not exhibit a GI malabsorption state  The patient is eating (either orally or via tube) and/or has been taking other orally administered medications for a least 24 hours  The patient is improving clinically and has a Tmax < 100.5  If you have questions about this conversion, please contact the Pharmacy Department  [x]   [phone] )  [facility] []   [phone] )  [facility]  []   [phone] )  [facility] []   [phone] )  [place], PharmD, BCPS 11/26/2013@11 :01 AM

## 2013-11-26 NOTE — Discharge Summary (Signed)
Physician Discharge Summary  [name] SWN:[id number] DOB: 10-04-1935 DOA: 11/24/2013  PCP: Odette Fraction, MD  Admit date: 11/24/2013 Discharge date: 11/26/2013  Time spent: 40 minutes  Recommendations for Outpatient Follow-up:  1. Followup with primary care physician in 1-2 weeks 2. Repeat chest x-ray on 5/26 to evaluate for pleural effusion/tiny pneumothorax, with results sent to primary care physician  Discharge Diagnoses:  Active Problems:   Hypertension   DM (diabetes mellitus)   Acute renal failure   Pleural effusion on right   PNA (pneumonia)   Pleural effusion   Discharge Condition: improved  Diet recommendation: low salt, low carb  Filed Weights   11/24/13 1338 11/25/13 0540 11/26/13 0542  Weight: 100.1 kg (220 lb 10.9 oz) 99.7 kg (219 lb 12.8 oz) 100.5 kg (221 lb 9 oz)    History of present illness:  [name] is a 78 y.o. male with PMH significant for TIA, Diabetes, Hypertension, COPD, prior history of smoking more than 40 years ago, who presents complaining of persistent cough, and SOB. He was seen in the urgent care few days ago for the same. He was prescribe prednisone, nebulizer and bactrim. He has not notice improvement. He also relates chest pain at times. He denies fever, weight loss.   Hospital Course:  This patient was admitted to the hospital with progressive shortness of breath and cough. He was found to have a large right pleural effusion with associated consolidation. He underwent thoracentesis with fluid analysis indicated an exudative effusion which was felt to be parapneumonic. Fluid analysis was negative for any malignant cells. It did not appear that this was related to congestive heart failure. Postthoracentesis, patient felt significantly improved and back to baseline. He did not have any pain or shortness of breath. There was noted to be a tiny pneumothorax on chest x-ray post thoracentesis. Chest x-ray was repeated later on in the evening  and remained stable. Patient was seen by pulmonology and it was felt that he would need followup chest x-ray as an outpatient, but his effusion was likely parapneumonic. It was felt safe for the patient to discharge home. Repeat chest x-ray has been arranged for 5/26 with results sent to the primary care physician. He has been advised that in the interim if he develops any worsening shortness of breath or chest pain they should return to the emergency room for repeat chest x-ray. The patient is ambulating without difficulty and is anxious to discharge home. He'll be sent home on a course of antibiotics.  Procedures:  Thoracentesis with removal of 1630cc of fluid  Consultations:  Pulmonology  Discharge Exam: Filed Vitals:   11/26/13 1524  BP: 107/62  Pulse: 89  Temp:   Resp:     General: NAd Cardiovascular: S1, S2 RRR Respiratory: CTA B  Discharge Instructions You were cared for by a hospitalist during your hospital stay. If you have any questions about your discharge medications or the care you received while you were in the hospital after you are discharged, you can call the unit and asked to speak with the hospitalist on call if the hospitalist that took care of you is not available. Once you are discharged, your primary care physician will handle any further medical issues. Please note that NO REFILLS for any discharge medications will be authorized once you are discharged, as it is imperative that you return to your primary care physician (or establish a relationship with a primary care physician if you do not have one) for  your aftercare needs so that they can reassess your need for medications and monitor your lab values.  Discharge Instructions   Call MD for:  difficulty breathing, headache or visual disturbances    Complete by:  As directed      Call MD for:  temperature >100.4    Complete by:  As directed      Diet - low sodium heart healthy    Complete by:  As directed       Increase activity slowly    Complete by:  As directed             Medication List    STOP taking these medications       predniSONE 20 MG tablet  Commonly known as:  DELTASONE     sulfamethoxazole-trimethoprim 800-160 MG per tablet  Commonly known as:  BACTRIM DS      TAKE these medications       amLODipine 2.5 MG tablet  Commonly known as:  NORVASC  TAKE 1 TABLET (2.5 MG TOTAL) BY MOUTH DAILY.     aspirin EC 81 MG tablet  Take 81 mg by mouth daily.     azithromycin 500 MG tablet  Commonly known as:  ZITHROMAX  Take one tab po daily     cefUROXime 500 MG tablet  Commonly known as:  CEFTIN  Take 1 tablet (500 mg total) by mouth 2 (two) times daily with a meal.     Fluticasone-Salmeterol 250-50 MCG/DOSE Aepb  Commonly known as:  ADVAIR  Inhale 1 puff into the lungs every 12 (twelve) hours.     ipratropium 0.02 % nebulizer solution  Commonly known as:  ATROVENT  Take 0.5 mg by nebulization 4 (four) times daily.     pioglitazone 30 MG tablet  Commonly known as:  ACTOS  Take 1 tablet (30 mg total) by mouth daily.       Allergies  Allergen Reactions  . Ace Inhibitors        Follow-up Information   Follow up with [facility] TOM, MD. (next week for appointment, you need a repeat chest xray on 11/26/13)    Specialty:  Family Medicine   Contact information:   [address] [place] [address] [phone]        The results of significant diagnostics from this hospitalization (including imaging, microbiology, ancillary and laboratory) are listed below for reference.    Significant Diagnostic Studies: Dg Chest 1 View  11/25/2013   CLINICAL DATA:  RIGHT pleural effusion post thoracentesis with removal of 1630 mL of yellow fluid  EXAM: CHEST - 1 VIEW  COMPARISON:  11/24/2013  FINDINGS: Mild enlargement of cardiac silhouette.  Mediastinal contours and pulmonary vascularity normal.  Markedly decreased RIGHT pleural effusion post thoracentesis.  Tiny  pneumothorax along lateral RIGHT chest wall on expiratory radiograph.  Minimal RIGHT basilar atelectasis.  LEFT lung clear.  IMPRESSION: Tiny RIGHT pneumothorax post thoracentesis.  Markedly decreased RIGHT pleural effusion with minimal residual RIGHT basilar atelectasis.  Findings called to Dr. Roderic Palau On 11/25/2013 at 1219 hr.   Electronically Signed   By: Lavonia Dana M.D.   On: 11/25/2013 12:20   Dg Chest 2 View  11/24/2013   CLINICAL DATA:  Shortness of breath.  EXAM: CHEST  2 VIEW  COMPARISON:  PA and lateral chest x-ray of March 24, 2013.  FINDINGS: Since the previous study there has developed a moderate-sized right pleural effusion as well as parenchymal consolidation in the lower lobe.  The right upper lobe is well-expanded. The left lung is clear. The mediastinum is not shifted. The visualized portions of the right heart border appear normal. The left heart border is normal. The pulmonary vascularity is not engorged. The observed portions of the bony thorax exhibit no acute abnormalities.  IMPRESSION: Since the previous study the patient has developed a moderate-sized right pleural effusion as well as right lower lobe parenchymal consolidation. The left lung is clear. Further evaluation of the thorax now with CT scanning is recommended.   Electronically Signed   By: David  Martinique   On: 11/24/2013 10:02   Ct Chest Wo Contrast  11/24/2013   CLINICAL DATA:  Shortness of breath, abnormal chest x-ray, elevated serum creatinine  EXAM: CT CHEST WITHOUT CONTRAST  TECHNIQUE: Multidetector CT imaging of the chest was performed following the standard protocol without IV contrast.  COMPARISON:  Chest x-ray dated Nov 24, 2013.  FINDINGS: There is a moderate to large right pleural effusion layering posteriorly. There is an area of parenchymal consolidation in the anterior aspect of the right lower lobe adjacent to the pleural effusion. The left lung is clear. There is no left pleural effusion. The cardiac  silhouette is normal in size. There is no pericardial effusion. There is coronary artery calcification. The caliber of the thoracic aorta is normal. There is a small hiatal hernia. The retrosternal soft tissues appear normal. No pathologic sized mediastinal or hilar lymph nodes are demonstrated.  Within the upper abdomen the observed portions of the liver and spleen appear normal.  The thoracic vertebral bodies are preserved in height. The sternum and visualized portions of the ribs exhibit no acute abnormalities.  IMPRESSION: 1. There is a moderate to large right pleural effusion layering posteriorly. Consolidated right lower lobe lies along the anterior aspect of the effusion. The right upper and much of the right middle lobe is clear. The left lung is clear. 2. There is no mediastinal nor hilar lymphadenopathy. 3. There are coronary artery calcifications present but no evidence of CHF.   Electronically Signed   By: David  Martinique   On: 11/24/2013 11:25   Dg Chest Port 1 View  11/25/2013   CLINICAL DATA:  Status post thoracentesis today. Right pneumothorax.  EXAM: PORTABLE CHEST - 1 VIEW  COMPARISON:  Nov 25, 2013 11:56 p.m.  FINDINGS: The heart size and mediastinal contours are stable. The previously noted right apical pleural line is again identified. There is no focal infiltrate or pulmonary edema. There is minimal right pleural effusion. The visualized skeletal structures are stable.  IMPRESSION: Small right apical pneumothorax.   Electronically Signed   By: Abelardo Diesel M.D.   On: 11/25/2013 20:14   US Thoracentesis Asp Pleural Space W/img Guide  11/25/2013   CLINICAL DATA:  RIGHT pleural effusion  EXAM: US THORACENTESIS ASP PLEURAL SPACE W/IMG GUIDE DIAGNOSTIC AND THERAPEUTIC  TECHNIQUE: Procedure, benefits, and risks of procedure were discussed with patient.  Written informed consent for procedure was obtained.  Time out protocol followed.  Pleural effusion localized at the posterior RIGHT hemi  thorax.  Skin prepped and draped in usual sterile fashion.  Skin and soft tissues anesthetized with 6 mL of 1% lidocaine.  8 French thoracentesis catheter placed into the RIGHT pleural space.  1630 mL of yellow to amber colored fluid was aspirated by syringe pump.  Fluid sent to laboratory for requested analysis.  Procedure tolerated well by patient without immediate complication.  Postprocedural chest radiograph ordered.  COMPARISON:  Chest  radiograph and chest CT 11/24/2013  FINDINGS: As above  IMPRESSION: Ultrasound guided RIGHT thoracentesis with removal of 1630 mL of fluid.   Electronically Signed   By: Lavonia Dana M.D.   On: 11/25/2013 15:53    Microbiology: Recent Results (from the past 240 hour(s))  BODY FLUID CULTURE     Status: None   Collection Time    11/25/13 11:40 AM      Result Value Ref Range Status   Specimen Description FLUID RIGHT LUNG   Final   Special Requests NONE   Final   Gram Stain     Final   Value: WBC PRESENT, PREDOMINANTLY MONONUCLEAR     NO ORGANISMS SEEN     CYTOSPIN Performed at [facility]     Performed at [facility]   Culture     Final   Value: NO GROWTH 1 DAY     Performed at [facility]   Report Status PENDING   Incomplete  CULTURE, EXPECTORATED SPUTUM-ASSESSMENT     Status: None   Collection Time    11/25/13 12:44 PM      Result Value Ref Range Status   Specimen Description SPUTUM EXPECTORATED   Final   Special Requests NONE   Final   Sputum evaluation     Final   Value: MICROSCOPIC FINDINGS SUGGEST THAT THIS SPECIMEN IS NOT REPRESENTATIVE OF LOWER RESPIRATORY SECRETIONS. PLEASE RECOLLECT.     Performed at [facility]   Report Status 11/25/2013 FINAL   Final     Labs: Basic Metabolic Panel:  Recent Labs Lab 11/24/13 0923 11/26/13 0556  NA 138 136*  K 3.8 3.9  CL 103 101  CO2 22 23  GLUCOSE 161* 184*  BUN 27* 23  CREATININE 1.63* 1.33  CALCIUM 8.8 8.6   Liver Function Tests:  Recent Labs Lab  11/24/13 1424  AST 13  ALT 8  ALKPHOS 60  BILITOT 0.4  PROT 7.1  ALBUMIN 3.4*   No results found for this basename: LIPASE, AMYLASE,  in the last 168 hours No results found for this basename: AMMONIA,  in the last 168 hours CBC:  Recent Labs Lab 11/24/13 0923  WBC 7.3  NEUTROABS 4.7  HGB 11.7*  HCT 33.5*  MCV 86.8  PLT 229   Cardiac Enzymes:  Recent Labs Lab 11/24/13 0923 11/24/13 1424 11/24/13 1952 11/25/13 0147  TROPONINI <0.30 <0.30 <0.30 <0.30   BNP: BNP (last 3 results)  Recent Labs  11/24/13 0923  PROBNP 356.7   CBG:  Recent Labs Lab 11/25/13 1654 11/25/13 2019 11/26/13 0753 11/26/13 1127 11/26/13 1613  GLUCAP 175* 137* 165* 111* 161*       Signed:  Tracia Lacomb  [facility] 11/26/2013, 7:25 PM

## 2013-11-26 NOTE — Discharge Instructions (Signed)
Pleural Effusion °The lining covering your lungs and the inside of your chest is called the pleura. Usually, the space between the 2 pleura contains no air and only a thin layer of fluid. A pleural effusion is an abnormal buildup of fluid in the pleural space. °Fluid gathers when there is increased pressure in the lung vessels. This forces fluids out of the lungs and into the pleural space. Vessels may also leak fluids when there are infections, such as pneumonia, or other causes of soreness and redness (inflammation). Fluids leak into the lungs when protein in the blood is low or when certain vessels (lymphatics) are blocked. °Finding a pleural effusion is important because it is usually caused by another disease. In order to treat a pleural effusion, your caregiver needs to find its cause. If left untreated, a large amount of fluid can build up and cause collapse of the lung. °CAUSES  °· Heart failure. °· Infections (pneumonia, tuberculosis), pulmonary embolism, pulmonary infarction. °· Cancer (primary lung and metastatic), asbestosis. °· Liver failure (cirrhosis). °· Nephrotic syndrome, peritoneal dialysis, kidney problems (uremia). °· Collagen vascular disease (systemic lupus erythematosis, rheumatoid arthritis). °· Injury (trauma) to the chest or rupture of the digestive tube (esophagus). °· Material in the chest or pleural space (hemothorax, chylothorax). °· Pancreatitis. °· Surgery. °· Drug reactions. °SYMPTOMS  °A pleural effusion can decrease the amount of space available for breathing and make you short of breath. The fluid can become infected, which may cause pain and fever. Often, the pain is worse when taking a deep breath. The underlying disease (heart failure, pneumonia, blood clot, tuberculosis, cancer) may also cause symptoms. °DIAGNOSIS  °· Your caregiver can usually tell what is wrong by talking to you (taking a history), doing an exam, and taking a routine X-ray. If the X-ray shows fluid in your  chest, often fluid is removed from your chest with a needle for testing (diagnostic thoracentesis). °· Sometimes, more specialized X-rays may be needed. °· Sometimes, a small piece of tissue is removed and examined by a specialist (biopsy). °TREATMENT  °Treatment varies based on what caused the pleural effusion. Treatments include: °· Removing as much fluid as possible using a needle (thoracentesis) to improve the cough and shortness of breath. This is a simple procedure which can be done at bedside. The risks are bleeding, infection, collapse of a lung, or low blood pressure. °· Placing a tube in the chest to drain the effusion (tube thoracostomy). This is often used when there is an infection in the fluid. This is a simple procedure which can often be done at bedside or in a clinic. The procedure may be painful. The risks are the same as using a needle to drain the fluid. The chest tube usually remains for a few days and is connected to suction to improve fluid drainage. The tube, after placement, usually does not cause much discomfort. °· Surgical removal of fibrous debris in and around the pleural space (decortication). This may be done with a flexible telescope (thoracoscope) through a small or large cut (incision). This is helpful for patients who have fibrosis or scar tissue that prevents complete lung expansion. The risks are infection, blood loss, and side effects from general anesthesia. °· Sometimes, a procedure called pleurodesis is done. A chest tube is placed and the fluid is drained. Next, an agent (tetracycline, talc powder) is added to the pleural space. This causes the lung and chest wall to stick together (adhesion). This leaves no potential space for fluid   to build up. The risks include infection, blood loss, and side effects from general anesthesia. °· If the effusion is caused by infection, it may be treated with antibiotics and improve without draining. °HOME CARE INSTRUCTIONS  °· Take any  medicines exactly as prescribed. °· Follow up with your caregiver as directed. °· Monitor your exercise capacity (the amount of walking you can do before you get short of breath). °· Do not smoke. Ask your caregiver for help quitting. °SEEK MEDICAL CARE IF:  °· Your exercise capacity seems to get worse or does not improve with time. °· You do not recover from your illness. °SEEK IMMEDIATE MEDICAL CARE IF:  °· Shortness of breath or chest pain develops or gets worse. °· You have an oral temperature above 102° F (38.9° C), not controlled by medicine. °· You develop a new cough, especially if the mucus (phlegm) is discolored. °MAKE SURE YOU:  °· Understand these instructions. °· Will watch your condition. °· Will get help right away if you are not doing well or get worse. °Document Released: 06/25/2005 Document Revised: 04/15/2013 Document Reviewed: 02/14/2007 °[employer]® Patient Information ©2014 [employer]. ° °

## 2013-11-26 NOTE — Consult Note (Signed)
Consult requested by: [facility] hospitalist  Consult requested ZOX:[id number] pleural effusion  HPI: This is a 78 year old who presented to the hospital on the 19th with pneumonia and a pleural effusion associated with that. He had thoracentesis yesterday which appears to be exudative. He says he feels better. He has no new complaints.. he has felt much better since he had thoracentesis. His CT shows that this is not  loculated.  Past Medical History  Diagnosis Date  . COPD (chronic obstructive pulmonary disease)   . Hx-TIA (transient ischemic attack)   . Diabetes mellitus   . Hypertension   . Hyperlipidemia   . Anxiety   . CVA (cerebral vascular accident)      Family History  Problem Relation Age of Onset  . Colon cancer Neg Hx   . Hypertension Mother   . Hypertension Father      History   Social History  . Marital Status: Married    Spouse Name: [name]    Number of Children: 6  . Years of Education: 11th   Occupational History  . Retired    Social History Main Topics  . Smoking status: Never Smoker   . Smokeless tobacco: Never Used  . Alcohol Use: No  . Drug Use: No  . Sexual Activity: None   Other Topics Concern  . None   Social History Narrative   Patient lives at home with family.   Caffeine Use: 1 cup of coffee     ROS: He has not lost weight. No chest pain no hemoptysis. He's not had any other symptoms. No edema. He has been coughing. At baseline he has some COPD and is short of breath at his best    Objective: Vital signs in last 24 hours: Temp:  [97.2 F (36.2 C)-98.3 F (36.8 C)] 98.2 F (36.8 C) (05/21 0542) Pulse Rate:  [79-86] 79 (05/21 0542) Resp:  [18-20] 20 (05/21 0542) BP: (112-152)/(69-90) 112/69 mmHg (05/21 0542) SpO2:  [96 %-98 %] 96 % (05/21 0733) Weight:  [100.5 kg (221 lb 9 oz)] 100.5 kg (221 lb 9 oz) (05/21 0542) Weight change: 2.977 kg (6 lb 9 oz) Last BM Date: 11/23/13  Intake/Output from previous day: 05/20 0701 - 05/21  0700 In: 1020 [P.O.:720; IV Piggyback:300] Out: 450 [Urine:450]  PHYSICAL EXAM He is awake and alert and looks comfortable. He is able to lie flat. His HEENT exam is unremarkable. Nose and throat are clear mucous membranes are moist. He does not have any JVD. His chest shows some rales in the right base. His heart is regular. Abdomen is soft. Extremities showed no edema. Central nervous system exam is grossly intact  Lab Results: Basic Metabolic Panel:  Recent Labs  11/24/13 0923 11/26/13 0556  NA 138 136*  K 3.8 3.9  CL 103 101  CO2 22 23  GLUCOSE 161* 184*  BUN 27* 23  CREATININE 1.63* 1.33  CALCIUM 8.8 8.6   Liver Function Tests:  Recent Labs  11/24/13 1424  AST 13  ALT 8  ALKPHOS 60  BILITOT 0.4  PROT 7.1  ALBUMIN 3.4*   No results found for this basename: LIPASE, AMYLASE,  in the last 72 hours No results found for this basename: AMMONIA,  in the last 72 hours CBC:  Recent Labs  11/24/13 0923  WBC 7.3  NEUTROABS 4.7  HGB 11.7*  HCT 33.5*  MCV 86.8  PLT 229   Cardiac Enzymes:  Recent Labs  11/24/13 1424 11/24/13 1952 11/25/13 0147  TROPONINI <  0.30 <0.30 <0.30   BNP:  Recent Labs  11/24/13 0923  PROBNP 356.7   D-Dimer: No results found for this basename: DDIMER,  in the last 72 hours CBG:  Recent Labs  11/24/13 2155 11/25/13 0812 11/25/13 1229 11/25/13 1654 11/25/13 2019 11/26/13 0753  GLUCAP 170* 169* 113* 175* 137* 165*   Hemoglobin A1C: No results found for this basename: HGBA1C,  in the last 72 hours Fasting Lipid Panel: No results found for this basename: CHOL, HDL, LDLCALC, TRIG, CHOLHDL, LDLDIRECT,  in the last 72 hours Thyroid Function Tests: No results found for this basename: TSH, T4TOTAL, FREET4, T3FREE, THYROIDAB,  in the last 72 hours Anemia Panel: No results found for this basename: VITAMINB12, FOLATE, FERRITIN, TIBC, IRON, RETICCTPCT,  in the last 72 hours Coagulation: No results found for this basename: LABPROT,  INR,  in the last 72 hours Urine Drug Screen: Drugs of Abuse     Component Value Date/Time   LABOPIA NONE DETECTED 11/30/2010 1427   COCAINSCRNUR NONE DETECTED 11/30/2010 1427   LABBENZ NONE DETECTED 11/30/2010 1427   AMPHETMU NONE DETECTED 11/30/2010 1427   THCU NONE DETECTED 11/30/2010 1427   LABBARB  Value: NONE DETECTED        DRUG SCREEN FOR MEDICAL PURPOSES ONLY.  IF CONFIRMATION IS NEEDED FOR ANY PURPOSE, NOTIFY LAB WITHIN 5 DAYS.        LOWEST DETECTABLE LIMITS FOR URINE DRUG SCREEN Drug Class       Cutoff (ng/mL) Amphetamine      1000 Barbiturate      200 Benzodiazepine   619 Tricyclics       509 Opiates          300 Cocaine          300 THC              50 11/30/2010 1427    Alcohol Level: No results found for this basename: ETH,  in the last 72 hours Urinalysis: No results found for this basename: COLORURINE, APPERANCEUR, LABSPEC, PHURINE, GLUCOSEU, HGBUR, BILIRUBINUR, KETONESUR, PROTEINUR, UROBILINOGEN, NITRITE, LEUKOCYTESUR,  in the last 72 hours Misc. Labs:   ABGS: No results found for this basename: PHART, PCO2, PO2ART, TCO2, HCO3,  in the last 72 hours   MICROBIOLOGY: Recent Results (from the past 240 hour(s))  BODY FLUID CULTURE     Status: None   Collection Time    11/25/13 11:40 AM      Result Value Ref Range Status   Specimen Description FLUID RIGHT LUNG   Final   Special Requests NONE   Final   Gram Stain     Final   Value: WBC PRESENT, PREDOMINANTLY MONONUCLEAR     NO ORGANISMS SEEN     CYTOSPIN Performed at [facility]     Performed at [facility]   Culture PENDING   Incomplete   Report Status PENDING   Incomplete  CULTURE, EXPECTORATED SPUTUM-ASSESSMENT     Status: None   Collection Time    11/25/13 12:44 PM      Result Value Ref Range Status   Specimen Description SPUTUM EXPECTORATED   Final   Special Requests NONE   Final   Sputum evaluation     Final   Value: MICROSCOPIC FINDINGS SUGGEST THAT THIS SPECIMEN IS NOT REPRESENTATIVE OF  LOWER RESPIRATORY SECRETIONS. PLEASE RECOLLECT.     Performed at [facility]   Report Status 11/25/2013 FINAL   Final    Studies/Results: Dg Chest 1  View  11/25/2013   CLINICAL DATA:  RIGHT pleural effusion post thoracentesis with removal of 1630 mL of yellow fluid  EXAM: CHEST - 1 VIEW  COMPARISON:  11/24/2013  FINDINGS: Mild enlargement of cardiac silhouette.  Mediastinal contours and pulmonary vascularity normal.  Markedly decreased RIGHT pleural effusion post thoracentesis.  Tiny pneumothorax along lateral RIGHT chest wall on expiratory radiograph.  Minimal RIGHT basilar atelectasis.  LEFT lung clear.  IMPRESSION: Tiny RIGHT pneumothorax post thoracentesis.  Markedly decreased RIGHT pleural effusion with minimal residual RIGHT basilar atelectasis.  Findings called to Dr. Roderic Palau On 11/25/2013 at 1219 hr.   Electronically Signed   By: Lavonia Dana M.D.   On: 11/25/2013 12:20   Dg Chest 2 View  11/24/2013   CLINICAL DATA:  Shortness of breath.  EXAM: CHEST  2 VIEW  COMPARISON:  PA and lateral chest x-ray of March 24, 2013.  FINDINGS: Since the previous study there has developed a moderate-sized right pleural effusion as well as parenchymal consolidation in the lower lobe. The right upper lobe is well-expanded. The left lung is clear. The mediastinum is not shifted. The visualized portions of the right heart border appear normal. The left heart border is normal. The pulmonary vascularity is not engorged. The observed portions of the bony thorax exhibit no acute abnormalities.  IMPRESSION: Since the previous study the patient has developed a moderate-sized right pleural effusion as well as right lower lobe parenchymal consolidation. The left lung is clear. Further evaluation of the thorax now with CT scanning is recommended.   Electronically Signed   By: David  Martinique   On: 11/24/2013 10:02   Ct Chest Wo Contrast  11/24/2013   CLINICAL DATA:  Shortness of breath, abnormal chest x-ray, elevated  serum creatinine  EXAM: CT CHEST WITHOUT CONTRAST  TECHNIQUE: Multidetector CT imaging of the chest was performed following the standard protocol without IV contrast.  COMPARISON:  Chest x-ray dated Nov 24, 2013.  FINDINGS: There is a moderate to large right pleural effusion layering posteriorly. There is an area of parenchymal consolidation in the anterior aspect of the right lower lobe adjacent to the pleural effusion. The left lung is clear. There is no left pleural effusion. The cardiac silhouette is normal in size. There is no pericardial effusion. There is coronary artery calcification. The caliber of the thoracic aorta is normal. There is a small hiatal hernia. The retrosternal soft tissues appear normal. No pathologic sized mediastinal or hilar lymph nodes are demonstrated.  Within the upper abdomen the observed portions of the liver and spleen appear normal.  The thoracic vertebral bodies are preserved in height. The sternum and visualized portions of the ribs exhibit no acute abnormalities.  IMPRESSION: 1. There is a moderate to large right pleural effusion layering posteriorly. Consolidated right lower lobe lies along the anterior aspect of the effusion. The right upper and much of the right middle lobe is clear. The left lung is clear. 2. There is no mediastinal nor hilar lymphadenopathy. 3. There are coronary artery calcifications present but no evidence of CHF.   Electronically Signed   By: David  Martinique   On: 11/24/2013 11:25   Dg Chest Port 1 View  11/25/2013   CLINICAL DATA:  Status post thoracentesis today. Right pneumothorax.  EXAM: PORTABLE CHEST - 1 VIEW  COMPARISON:  Nov 25, 2013 11:56 p.m.  FINDINGS: The heart size and mediastinal contours are stable. The previously noted right apical pleural line is again identified. There is no focal infiltrate  or pulmonary edema. There is minimal right pleural effusion. The visualized skeletal structures are stable.  IMPRESSION: Small right apical  pneumothorax.   Electronically Signed   By: Abelardo Diesel M.D.   On: 11/25/2013 20:14   US Thoracentesis Asp Pleural Space W/img Guide  11/25/2013   CLINICAL DATA:  RIGHT pleural effusion  EXAM: US THORACENTESIS ASP PLEURAL SPACE W/IMG GUIDE DIAGNOSTIC AND THERAPEUTIC  TECHNIQUE: Procedure, benefits, and risks of procedure were discussed with patient.  Written informed consent for procedure was obtained.  Time out protocol followed.  Pleural effusion localized at the posterior RIGHT hemi thorax.  Skin prepped and draped in usual sterile fashion.  Skin and soft tissues anesthetized with 6 mL of 1% lidocaine.  8 French thoracentesis catheter placed into the RIGHT pleural space.  1630 mL of yellow to amber colored fluid was aspirated by syringe pump.  Fluid sent to laboratory for requested analysis.  Procedure tolerated well by patient without immediate complication.  Postprocedural chest radiograph ordered.  COMPARISON:  Chest radiograph and chest CT 11/24/2013  FINDINGS: As above  IMPRESSION: Ultrasound guided RIGHT thoracentesis with removal of 1630 mL of fluid.   Electronically Signed   By: Lavonia Dana M.D.   On: 11/25/2013 15:53    Medications:  Prior to Admission:  Prescriptions prior to admission  Medication Sig Dispense Refill  . amLODipine (NORVASC) 2.5 MG tablet TAKE 1 TABLET (2.5 MG TOTAL) BY MOUTH DAILY.  30 tablet  11  . aspirin EC 81 MG tablet Take 81 mg by mouth daily.      . Fluticasone-Salmeterol (ADVAIR) 250-50 MCG/DOSE AEPB Inhale 1 puff into the lungs every 12 (twelve) hours.  60 each  11  . ipratropium (ATROVENT) 0.02 % nebulizer solution Take 0.5 mg by nebulization 4 (four) times daily.      . pioglitazone (ACTOS) 30 MG tablet Take 1 tablet (30 mg total) by mouth daily.  30 tablet  5  . predniSONE (DELTASONE) 20 MG tablet Take 20 mg by mouth See admin instructions. 3 tabs x 3 days; 2 tabs x 3 days, 1 tab x 3 days.  Starting 11/21/2013.      . sulfamethoxazole-trimethoprim (BACTRIM  DS) 800-160 MG per tablet Take 1 tablet by mouth 2 (two) times daily. Starting 11/21/2013 x 10 days.       Scheduled: . aspirin EC  81 mg Oral Daily  . azithromycin  500 mg Intravenous Q24H  . cefTRIAXone (ROCEPHIN)  IV  1 g Intravenous Q24H  . docusate sodium  100 mg Oral BID  . enoxaparin (LOVENOX) injection  40 mg Subcutaneous Q24H  . insulin aspart  0-9 Units Subcutaneous TID WC  . ipratropium-albuterol  3 mL Nebulization TID  . sodium chloride  3 mL Intravenous Q12H   Continuous:  [id number] chloride, acetaminophen, acetaminophen, albuterol, HYDROcodone-acetaminophen, ondansetron (ZOFRAN) IV, ondansetron, sodium chloride  Assesment: He was admitted with pneumonia. He had a large right pleural effusion and this has been removed with thoracentesis. He feels much better. The effusion is exudative and I think clearly parapneumonic but I do not think it needs more evaluation except followup to be sure that it resolves. It is not loculated. I discussed this at length with the patient and family at bedside Active Problems:   Hypertension   DM (diabetes mellitus)   Acute renal failure   Pleural effusion on right   PNA (pneumonia)   Pleural effusion    Plan: He needs serial chest x-rays to be sure that  he does not reaccumulate fluid. This will need to be followed to total resolution. Continue with antibiotics et Ronney Asters.    LOS: 2 days   [name] 11/26/2013, 8:24 AM

## 2013-11-28 LAB — BODY FLUID CULTURE: Culture: NO GROWTH

## 2013-12-01 ENCOUNTER — Other Ambulatory Visit (HOSPITAL_COMMUNITY): Payer: Self-pay | Admitting: Pulmonary Disease

## 2013-12-01 ENCOUNTER — Ambulatory Visit (HOSPITAL_COMMUNITY)
Admission: RE | Admit: 2013-12-01 | Discharge: 2013-12-01 | Disposition: A | Discharge status: Home / Self Care | Payer: Medicare Other | Source: Ambulatory Visit | Attending: Pulmonary Disease | Admitting: Pulmonary Disease

## 2013-12-01 DIAGNOSIS — J9819 Other pulmonary collapse: Secondary | ICD-10-CM | POA: Insufficient documentation

## 2013-12-01 DIAGNOSIS — J90 Pleural effusion, not elsewhere classified: Secondary | ICD-10-CM

## 2013-12-04 ENCOUNTER — Other Ambulatory Visit: Payer: Self-pay | Admitting: Family Medicine

## 2013-12-04 ENCOUNTER — Ambulatory Visit (INDEPENDENT_AMBULATORY_CARE_PROVIDER_SITE_OTHER): Payer: Medicare Other | Admitting: Family Medicine

## 2013-12-04 ENCOUNTER — Encounter: Payer: Self-pay | Admitting: Family Medicine

## 2013-12-04 VITALS — BP 100/60 | HR 78 | Temp 97.9°F | Resp 14 | Ht 70.0 in | Wt 219.0 lb

## 2013-12-04 DIAGNOSIS — J181 Lobar pneumonia, unspecified organism: Secondary | ICD-10-CM

## 2013-12-04 DIAGNOSIS — J90 Pleural effusion, not elsewhere classified: Secondary | ICD-10-CM

## 2013-12-04 DIAGNOSIS — J189 Pneumonia, unspecified organism: Secondary | ICD-10-CM

## 2013-12-04 DIAGNOSIS — Z09 Encounter for follow-up examination after completed treatment for conditions other than malignant neoplasm: Secondary | ICD-10-CM

## 2013-12-04 MED ORDER — AZITHROMYCIN 250 MG PO TABS: ORAL_TABLET | ORAL | Status: DC

## 2013-12-04 MED ORDER — CEFUROXIME AXETIL 500 MG PO TABS: 500.0000 mg | ORAL_TABLET | Freq: Two times a day (BID) | ORAL | Status: DC

## 2013-12-04 NOTE — Addendum Note (Signed)
Addended by: Jenna Luo on: 12/04/2013 03:59 PM   Modules accepted: Orders

## 2013-12-04 NOTE — Progress Notes (Signed)
Subjective:    Patient ID: [name], male    DOB: 07/02/1936, 78 y.o.   MRN: [id number]  HPI Patient was recently admitted to hospital.  I have copied relevant portions of dc summary below for my reference: Admit date: 11/24/2013  Discharge date: 11/26/2013  Time spent: 40 minutes  Recommendations for Outpatient Follow-up:  1. Followup with primary care physician in 1-2 weeks 2. Repeat chest x-ray on 5/26 to evaluate for pleural effusion/tiny pneumothorax, with results sent to primary care physician Discharge Diagnoses:  Active Problems:  Hypertension  DM (diabetes mellitus)  Acute renal failure  Pleural effusion on right  PNA (pneumonia)  Pleural effusion  Discharge Condition: improved  Diet recommendation: low salt, low carb  Filed Weights    11/24/13 1338  11/25/13 0540  11/26/13 0542   Weight:  100.1 kg (220 lb 10.9 oz)  99.7 kg (219 lb 12.8 oz)  100.5 kg (221 lb 9 oz)   History of present illness:  [name] is a 78 y.o. male with PMH significant for TIA, Diabetes, Hypertension, COPD, prior history of smoking more than 40 years ago, who presents complaining of persistent cough, and SOB. He was seen in the urgent care few days ago for the same. He was prescribe prednisone, nebulizer and bactrim. He has not notice improvement. He also relates chest pain at times. He denies fever, weight loss.  Hospital Course:  This patient was admitted to the hospital with progressive shortness of breath and cough. He was found to have a large right pleural effusion with associated consolidation. He underwent thoracentesis with fluid analysis indicated an exudative effusion which was felt to be parapneumonic. Fluid analysis was negative for any malignant cells. It did not appear that this was related to congestive heart failure. Postthoracentesis, patient felt significantly improved and back to baseline. He did not have any pain or shortness of breath. There was noted to be a tiny  pneumothorax on chest x-ray post thoracentesis. Chest x-ray was repeated later on in the evening and remained stable. Patient was seen by pulmonology and it was felt that he would need followup chest x-ray as an outpatient, but his effusion was likely parapneumonic. It was felt safe for the patient to discharge home. Repeat chest x-ray has been arranged for 5/26 with results sent to the primary care physician. He has been advised that in the interim if he develops any worsening shortness of breath or chest pain they should return to the emergency room for repeat chest x-ray. The patient is ambulating without difficulty and is anxious to discharge home. He'll be sent home on a course of antibiotics.  Procedures:  Thoracentesis with removal of 1630cc of fluid Consultations:  Pulmonology 12/04/13 He is here today for follow up.  Repeat CXR 5/26 revealed: IMPRESSION:  1. Recurrent small to moderate right pleural effusion and associated  right basilar atelectasis.  2. Negative for pneumothorax.  3. The left lung is clear.  Comparatively the pleural effusion is much smaller than the initial x-ray of May 19 he did the analysis of the pleural fluid. Pleural fluid LDH/serum LDH was greater than 0.6 suggesting exudative effusion.  Pleural fluid analysis revealed no malignant cells although there were atypical lymphocytes.  Patient denies any fevers or chills. He denies any cough. He denies any chest pain. He denies any pleurisy. He denies any weight loss. CT scan of the chest revealed a right lower lobe consolidation but no malignancy. Patient has finished his antibiotics  today . Past Medical History  Diagnosis Date  . COPD (chronic obstructive pulmonary disease)   . Hx-TIA (transient ischemic attack)   . Diabetes mellitus   . Hypertension   . Hyperlipidemia   . Anxiety   . CVA (cerebral vascular accident)    Past Surgical History  Procedure Laterality Date  . Rotator cuff repair    . Cholecystectomy  N/A 02/23/2013    Procedure: LAPAROSCOPIC CHOLECYSTECTOMY;  Surgeon: Jamesetta So, MD;  Location: [facility];  Service: General;  Laterality: N/A;  . Ercp N/A 02/24/2013    Procedure: ENDOSCOPIC RETROGRADE CHOLANGIOPANCREATOGRAPHY;  Surgeon: Daneil Dolin, MD;  Location: [facility];  Service: Endoscopy;  Laterality: N/A;   Current Outpatient Prescriptions on File Prior to Visit  Medication Sig Dispense Refill  . amLODipine (NORVASC) 2.5 MG tablet TAKE 1 TABLET (2.5 MG TOTAL) BY MOUTH DAILY.  30 tablet  11  . aspirin EC 81 MG tablet Take 81 mg by mouth daily.      [name] Kitchen azithromycin (ZITHROMAX) 500 MG tablet Take one tab po daily  7 each  0  . cefUROXime (CEFTIN) 500 MG tablet Take 1 tablet (500 mg total) by mouth 2 (two) times daily with a meal.  14 tablet  0  . Fluticasone-Salmeterol (ADVAIR) 250-50 MCG/DOSE AEPB Inhale 1 puff into the lungs every 12 (twelve) hours.  60 each  11  . ipratropium (ATROVENT) 0.02 % nebulizer solution Take 0.5 mg by nebulization 4 (four) times daily.      . pioglitazone (ACTOS) 30 MG tablet Take 1 tablet (30 mg total) by mouth daily.  30 tablet  5   No current facility-administered medications on file prior to visit.   Allergies  Allergen Reactions  . Ace Inhibitors    History   Social History  . Marital Status: Married    Spouse Name: [name]    Number of Children: 6  . Years of Education: 11th   Occupational History  . Retired    Social History Main Topics  . Smoking status: Never Smoker   . Smokeless tobacco: Never Used  . Alcohol Use: No  . Drug Use: No  . Sexual Activity: Not on file   Other Topics Concern  . Not on file   Social History Narrative   Patient lives at home with family.   Caffeine Use: 1 cup of coffee     Review of Systems     Objective:   Physical Exam  Vitals reviewed. Constitutional: He appears well-developed and well-nourished.  Cardiovascular: Normal rate, regular rhythm, normal heart sounds and intact distal pulses.     No murmur heard. Pulmonary/Chest: Effort normal. He has decreased breath sounds in the right lower field. He has no wheezes. He has no rhonchi. He has no rales.  Abdominal: Soft. Bowel sounds are normal. He exhibits no distension and no mass. There is no tenderness. There is no rebound and no guarding.  Musculoskeletal: He exhibits no edema.          Assessment & Plan:  Hospital discharge follow-up  Recurrent right pleural effusion - Plan: cefUROXime (CEFTIN) 500 MG tablet, azithromycin (ZITHROMAX) 250 MG tablet, DG Chest 2 View  Right lower lobe pneumonia - Plan: cefUROXime (CEFTIN) 500 MG tablet, azithromycin (ZITHROMAX) 250 MG tablet, DG Chest 2 View  Effusion has reaccumulated in the right lower lobe. However the effusion appears to be an exudative effusion secondary to right lower lobe pneumonia and it appears to be slowly improving (ACCP 1-2).  Furthermore, fluid culture was negative.   I would like to extend his antibiotics for an additional week.  Repeat a chest x-ray after abx and every week until chest x-ray shows resolution of the parapneumonic effusion. If effusion worsens, I would repeat thoracentesis.

## 2013-12-10 ENCOUNTER — Ambulatory Visit (HOSPITAL_COMMUNITY)
Admission: RE | Admit: 2013-12-10 | Discharge: 2013-12-10 | Disposition: A | Discharge status: Home / Self Care | Payer: Medicare Other | Source: Ambulatory Visit | Attending: Family Medicine | Admitting: Family Medicine

## 2013-12-10 DIAGNOSIS — J181 Lobar pneumonia, unspecified organism: Secondary | ICD-10-CM

## 2013-12-10 DIAGNOSIS — J90 Pleural effusion, not elsewhere classified: Secondary | ICD-10-CM | POA: Insufficient documentation

## 2013-12-10 DIAGNOSIS — J189 Pneumonia, unspecified organism: Secondary | ICD-10-CM

## 2013-12-14 ENCOUNTER — Encounter: Payer: Self-pay | Admitting: Emergency Medicine

## 2013-12-14 ENCOUNTER — Other Ambulatory Visit: Payer: Self-pay | Admitting: Family Medicine

## 2013-12-14 ENCOUNTER — Ambulatory Visit (INDEPENDENT_AMBULATORY_CARE_PROVIDER_SITE_OTHER): Payer: Medicare Other | Admitting: Emergency Medicine

## 2013-12-14 VITALS — BP 130/84 | HR 83 | Ht 71.5 in | Wt 222.0 lb

## 2013-12-14 DIAGNOSIS — J90 Pleural effusion, not elsewhere classified: Secondary | ICD-10-CM

## 2013-12-14 NOTE — Assessment & Plan Note (Signed)
Etiology unclear although the labs suggest a lymphocyte predominant exudate. Cx's were negative. The cell count mentioned large cells, mesothelial cells, but cytology was negative. I explained to him that lung CA is a possibility here. I would like to repeat his thora since cytology sensitivity goes up with serial taps, and then check CT chest immediately after to get a good look at his parenchyma. Will follow up with him after these things are completed to review the results. He may end up requiring pleuroscopy, bronchoscopy or some other type of bx.

## 2013-12-14 NOTE — Progress Notes (Signed)
Subjective:    Patient ID: [name], male    DOB: 1936-06-02, 78 y.o.   MRN: [id number]  HPI 78 yo former smoker (very remote), hx of HTN, DM, CVA. Carries the dx of COPD / Asthma although never smoker. He was admitted to [facility] with slowly progressive dyspnea mid-May. Found to have a R pleural effusion > thora  Pleural fluid 11/25/13 > alb 3.0, TP 5.3, glu 162, LDH 159.  Cytology negative, WBC 1378 predominant lymphs no PMN, cx negative  Review of Systems  Constitutional: Positive for appetite change. Negative for fever.  HENT: Negative for congestion, dental problem, ear pain, nosebleeds, postnasal drip, rhinorrhea, sinus pressure, sneezing, sore throat and trouble swallowing.   Eyes: Negative for redness and itching.  Respiratory: Positive for cough and shortness of breath. Negative for chest tightness and wheezing.   Cardiovascular: Negative for palpitations and leg swelling.  Gastrointestinal: Negative for nausea and vomiting.  Genitourinary: Negative for dysuria.  Musculoskeletal: Negative for joint swelling.  Skin: Negative for rash.  Neurological: Negative for headaches.  Hematological: Does not bruise/bleed easily.  Psychiatric/Behavioral: Negative for dysphoric mood. The patient is not nervous/anxious.    Past Medical History  Diagnosis Date  . COPD (chronic obstructive pulmonary disease)   . Hx-TIA (transient ischemic attack)   . Diabetes mellitus   . Hypertension   . Hyperlipidemia   . Anxiety   . CVA (cerebral vascular accident)      Family History  Problem Relation Age of Onset  . Colon cancer Neg Hx   . Hypertension Mother   . Hypertension Father      History   Social History  . Marital Status: Married    Spouse Name: [name]    Number of Children: 6  . Years of Education: 11th   Occupational History  . Retired    Social History Main Topics  . Smoking status: Never Smoker   . Smokeless tobacco: Never Used  . Alcohol Use: No  . Drug Use: No  .  Sexual Activity: Not on file   Other Topics Concern  . Not on file   Social History Narrative   Patient lives at home with family.   Caffeine Use: 1 cup of coffee     Allergies  Allergen Reactions  . Ace Inhibitors      Outpatient Prescriptions Prior to Visit  Medication Sig Dispense Refill  . amLODipine (NORVASC) 2.5 MG tablet TAKE 1 TABLET (2.5 MG TOTAL) BY MOUTH DAILY.  30 tablet  11  . aspirin EC 81 MG tablet Take 81 mg by mouth daily.      . Fluticasone-Salmeterol (ADVAIR) 250-50 MCG/DOSE AEPB Inhale 1 puff into the lungs every 12 (twelve) hours.  60 each  11  . ipratropium (ATROVENT) 0.02 % nebulizer solution Take 0.5 mg by nebulization 4 (four) times daily.      . pioglitazone (ACTOS) 30 MG tablet TAKE 1 TABLET (30 MG TOTAL) BY MOUTH DAILY.  30 tablet  5  . azithromycin (ZITHROMAX) 250 MG tablet Take one tab po daily  7 tablet  0  . cefUROXime (CEFTIN) 500 MG tablet Take 1 tablet (500 mg total) by mouth 2 (two) times daily with a meal.  14 tablet  0   No facility-administered medications prior to visit.         Objective:   Physical Exam Filed Vitals:   12/14/13 1611  BP: 130/84  Pulse: 83  Height: 5' 11.5" (1.816 m)  Weight: 222  lb (100.699 kg)  SpO2: 98%   Gen: Pleasant, well-nourished, in no distress,  normal affect  ENT: No lesions,  mouth clear,  oropharynx clear, no postnasal drip  Neck: No JVD, no TMG, no carotid bruits  Lungs: No use of accessory muscles, clear without rales or rhonchi, decreased at the R base, no crackles.   Cardiovascular: RRR, heart sounds normal, no murmur or gallops, no peripheral edema  Musculoskeletal: No deformities, no cyanosis or clubbing  Neuro: alert, non focal  Skin: Warm, no lesions or rashes     Assessment & Plan:  Pleural effusion on right Etiology unclear although the labs suggest a lymphocyte predominant exudate. Cx's were negative. The cell count mentioned large cells, mesothelial cells, but cytology was  negative. I explained to him that lung CA is a possibility here. I would like to repeat his thora since cytology sensitivity goes up with serial taps, and then check CT chest immediately after to get a good look at his parenchyma. Will follow up with him after these things are completed to review the results. He may end up requiring pleuroscopy, bronchoscopy or some other type of bx.

## 2013-12-14 NOTE — Patient Instructions (Signed)
We will arrange for your to have a repeat right thoracentesis at [facility] We will order the lab work on your pleural fluid similar to your hospitalization This will be followed by a CT scan of your chest  Follow with Dr Lamonte Sakai next available AFTER your testing so we can review.

## 2013-12-16 ENCOUNTER — Telehealth: Payer: Self-pay | Admitting: Emergency Medicine

## 2013-12-16 DIAGNOSIS — J90 Pleural effusion, not elsewhere classified: Secondary | ICD-10-CM

## 2013-12-16 NOTE — Telephone Encounter (Signed)
Called and [email or web address] for the pts wife to make her aware that orders have been placed and we will be scheduling the thorocentisis and ct at [facility].

## 2013-12-16 NOTE — Telephone Encounter (Signed)
Per OV 12/14/13:   Patient Instructions      We will arrange for your to have a repeat right thoracentesis at [facility] We will order the lab work on your pleural fluid similar to your hospitalization This will be followed by a CT scan of your chest  Follow with Dr Lamonte Sakai next available AFTER your testing so we can review  ---  I don't see any order in EPIC for this procedure to be done. Also what labs needs to be done se we can order this once scheduled? Please advise RB thanks

## 2013-12-16 NOTE — Telephone Encounter (Signed)
Sorry I thought this had already been done - he needs IR to do thora (US thoracentesis)  - fluid needs to be sent for>  cytology, cell count, bacterial culture, TSH, protein, glucose Then he needs CT scan of the chest without contrast (would like for it to be right after the thoracentesis)

## 2013-12-17 NOTE — Telephone Encounter (Signed)
I called pt spouse and made her aware. Please advise PCC's thanks

## 2013-12-21 ENCOUNTER — Telehealth: Payer: Self-pay | Admitting: Emergency Medicine

## 2013-12-21 NOTE — Telephone Encounter (Addendum)
Spoke with pt wife--states that pt has appt later this week 6/18 with RB--- I do not see an appt scheduled for 12/24/13 with RB Pt wife requesting we see pt sooner this week-- c/o inc wheezing, cough, SOB appt scheduled to 12/22/13 at 945 with RB Refused appt today---states that if he gets worse as the day goes on she will take him to the ED.   Nothing further needed.

## 2013-12-22 ENCOUNTER — Ambulatory Visit: Payer: Medicare Other | Admitting: Emergency Medicine

## 2013-12-23 ENCOUNTER — Telehealth: Payer: Self-pay | Admitting: Family Medicine

## 2013-12-23 NOTE — Telephone Encounter (Signed)
Spoke to pt's dtr and she states that she talked to you about taking her fathers license away and he stated that he does not drive out of the yard but he is and his wife has tried to stop him but he just leaves anyway's. She would like for [place] to contact DMV to have his license revoked. (she is aware you are oot and will call her back next week)

## 2013-12-23 NOTE — Telephone Encounter (Signed)
Message copied by Alyson Locket on Wed Dec 23, 2013  3:45 PM ------      Message from: Devoria Glassing      Created: Wed Dec 23, 2013 11:08 AM       [place] daughter calling to talk to you about her dads driving she has had enough and is to the point where she has to put her foot down about her driving she said dr pickard had talked with her about this previously  ------

## 2013-12-24 ENCOUNTER — Other Ambulatory Visit (HOSPITAL_COMMUNITY): Payer: Medicare Other

## 2013-12-24 ENCOUNTER — Ambulatory Visit (HOSPITAL_COMMUNITY)
Admission: RE | Admit: 2013-12-24 | Discharge: 2013-12-24 | Disposition: A | Discharge status: Home / Self Care | Payer: Medicare Other | Source: Ambulatory Visit | Attending: Emergency Medicine | Admitting: Emergency Medicine

## 2013-12-24 ENCOUNTER — Encounter (HOSPITAL_COMMUNITY): Payer: Self-pay

## 2013-12-24 DIAGNOSIS — J90 Pleural effusion, not elsewhere classified: Secondary | ICD-10-CM | POA: Insufficient documentation

## 2013-12-24 LAB — BODY FLUID CELL COUNT WITH DIFFERENTIAL
Eos, Fluid: 5 %
LYMPHS FL: 63 %
Monocyte-Macrophage-Serous Fluid: 32 % — ABNORMAL LOW (ref 50–90)
Neutrophil Count, Fluid: 0 % (ref 0–25)
Other Cells, Fluid: ABNORMAL %
Total Nucleated Cell Count, Fluid: 1904 cu mm — ABNORMAL HIGH (ref 0–1000)

## 2013-12-24 LAB — GLUCOSE, PERITONEAL FLUID: Glucose, Peritoneal Fluid: 147 mg/dL

## 2013-12-24 NOTE — Progress Notes (Signed)
Thoracentesis complete no signs of distress. 1550 ml yellow colored pleural fluid removed.

## 2013-12-24 NOTE — Procedures (Signed)
PreOperative Dx: RIGHT pleural effusion Postoperative Dx: RIGHT pleural effusion Procedure:   US guided RIGHT thoracentesis Radiologist:  Thornton Papas Anesthesia:  6.5 ml of 1% lidocaine Specimen:  1550 ml of clear yellow colored fluid EBL:   None Complications: None - no PTX on CT chest performed post procedure per referring MD order

## 2013-12-25 ENCOUNTER — Telehealth: Payer: Self-pay | Admitting: Family Medicine

## 2013-12-25 NOTE — Telephone Encounter (Signed)
Pt's wife aware and med called to [facility]

## 2013-12-25 NOTE — Telephone Encounter (Signed)
Patient would like to know if we can prescribe pain meds for him, he had a procedure in the hospital yesterday and he is agitated [phone]

## 2013-12-25 NOTE — Telephone Encounter (Signed)
Send Ultram 50mg  1 po BID prn pain  #20 , if he is still uncomfortable he needs to be evaluated

## 2013-12-27 LAB — BODY FLUID CULTURE: CULTURE: NO GROWTH

## 2013-12-28 NOTE — Telephone Encounter (Signed)
Please notify DMV that the patient should not be driving.

## 2013-12-29 LAB — ANAEROBIC CULTURE

## 2013-12-30 NOTE — Telephone Encounter (Signed)
Form faxed to [facility] to have them reevaluate his driving ability.

## 2014-01-01 ENCOUNTER — Telehealth: Payer: Self-pay | Admitting: Emergency Medicine

## 2014-01-01 NOTE — Telephone Encounter (Signed)
Spoke with spouse. Aware RB not in office. Spouse requesting results. Please advise thanks

## 2014-01-01 NOTE — Telephone Encounter (Signed)
Discussed Ct scan results with him. The pleural effusion was well drained, allows visualization of a rounded RLL opacity that looks like Atx, consider also malignancy but less likely. The repeat cytology is negative for malignancy. I think at this point we can follow him for re-accumulation. It the effusion returns then we will likely pursue repeat thora + biopsy or pleuroscopy. He may merit FOB or VATS to sample the RLL region of consolidation. If the fluid does not reaccumulate then I will plan to repeat his Ct scan in 2-3 months. Please schedule him to come back to see me in 2-3 weeks with a CXR.

## 2014-01-01 NOTE — Telephone Encounter (Signed)
Called spoke with pt spouse. appt scheduled for 01/29/14 at 9:30. Nothing further needed

## 2014-01-13 ENCOUNTER — Other Ambulatory Visit: Payer: Self-pay | Admitting: Family Medicine

## 2014-01-21 ENCOUNTER — Encounter: Payer: Self-pay | Admitting: Family Medicine

## 2014-01-21 ENCOUNTER — Ambulatory Visit (INDEPENDENT_AMBULATORY_CARE_PROVIDER_SITE_OTHER): Payer: Medicare Other | Admitting: Family Medicine

## 2014-01-21 VITALS — BP 130/70 | HR 86 | Temp 97.2°F | Resp 18 | Ht 71.5 in | Wt 219.0 lb

## 2014-01-21 DIAGNOSIS — J90 Pleural effusion, not elsewhere classified: Secondary | ICD-10-CM

## 2014-01-21 NOTE — Progress Notes (Signed)
Subjective:    Patient ID: [name], male    DOB: 12/17/1935, 78 y.o.   MRN: [id number]  HPI 12/04/13 Patient was recently admitted to hospital.  I have copied relevant portions of dc summary below for my reference: Admit date: 11/24/2013  Discharge date: 11/26/2013  Time spent: 40 minutes  Recommendations for Outpatient Follow-up:  1. Followup with primary care physician in 1-2 weeks 2. Repeat chest x-ray on 5/26 to evaluate for pleural effusion/tiny pneumothorax, with results sent to primary care physician Discharge Diagnoses:  Active Problems:  Hypertension  DM (diabetes mellitus)  Acute renal failure  Pleural effusion on right  PNA (pneumonia)  Pleural effusion  Discharge Condition: improved  Diet recommendation: low salt, low carb  Filed Weights    11/24/13 1338  11/25/13 0540  11/26/13 0542   Weight:  100.1 kg (220 lb 10.9 oz)  99.7 kg (219 lb 12.8 oz)  100.5 kg (221 lb 9 oz)   History of present illness:  [name] is a 78 y.o. male with PMH significant for TIA, Diabetes, Hypertension, COPD, prior history of smoking more than 40 years ago, who presents complaining of persistent cough, and SOB. He was seen in the urgent care few days ago for the same. He was prescribe prednisone, nebulizer and bactrim. He has not notice improvement. He also relates chest pain at times. He denies fever, weight loss.  Hospital Course:  This patient was admitted to the hospital with progressive shortness of breath and cough. He was found to have a large right pleural effusion with associated consolidation. He underwent thoracentesis with fluid analysis indicated an exudative effusion which was felt to be parapneumonic. Fluid analysis was negative for any malignant cells. It did not appear that this was related to congestive heart failure. Postthoracentesis, patient felt significantly improved and back to baseline. He did not have any pain or shortness of breath. There was noted to be a tiny  pneumothorax on chest x-ray post thoracentesis. Chest x-ray was repeated later on in the evening and remained stable. Patient was seen by pulmonology and it was felt that he would need followup chest x-ray as an outpatient, but his effusion was likely parapneumonic. It was felt safe for the patient to discharge home. Repeat chest x-ray has been arranged for 5/26 with results sent to the primary care physician. He has been advised that in the interim if he develops any worsening shortness of breath or chest pain they should return to the emergency room for repeat chest x-ray. The patient is ambulating without difficulty and is anxious to discharge home. He'll be sent home on a course of antibiotics.  Procedures:  Thoracentesis with removal of 1630cc of fluid Consultations:  Pulmonology 12/04/13 He is here today for follow up.  Repeat CXR 5/26 revealed: IMPRESSION:  1. Recurrent small to moderate right pleural effusion and associated  right basilar atelectasis.  2. Negative for pneumothorax.  3. The left lung is clear.  Comparatively the pleural effusion is smaller.  I reviewed the analysis of the pleural fluid. Pleural fluid LDH/serum LDH was greater than 0.6 suggesting exudative effusion.  Pleural fluid analysis revealed no malignant cells although there were atypical lymphocytes.  Patient denies any fevers or chills. He denies any cough. He denies any chest pain. He denies any pleurisy. He denies any weight loss. CT scan of the chest revealed a right lower lobe consolidation but no malignancy. Patient has finished his antibiotics today .  At that time,  my plan was: Recurrent right pleural effusion  Effusion has reaccumulated in the right lower lobe. However the effusion appears to be an exudative effusion secondary to right lower lobe pneumonia and it appears to be slowly improving (ACCP 1-2).  Furthermore, fluid culture was negative.   I would like to extend his antibiotics for an additional week.   Repeat a chest x-ray after abx and every week until chest x-ray shows resolution of the parapneumonic effusion. If effusion worsens, I would repeat thoracentesis.  01/21/14 Repeat chest x-ray showed worsening of the patient's right pleural effusion. The patient was referred to pulmonary who performed repeat thoracentesis.  Followup CT scan on June 18 showed near resolution of the patient's right pleural effusion. It also showed a 2 cm x 5 cm area of atelectasis versus persistent pneumonia. The current plan was to have the patient follow up with pulmonology next week. He has not had any repeat imaging since the 18th of June.    Symptomatically the patient is back to his baseline. He denies any chest pain or shortness of breath. He denies any fever. He denies any dyspnea on exertion beyond his normal baseline. He denies any pleurisy. Past Medical History  Diagnosis Date  . COPD (chronic obstructive pulmonary disease)   . Hx-TIA (transient ischemic attack)   . Diabetes mellitus   . Hypertension   . Hyperlipidemia   . Anxiety   . CVA (cerebral vascular accident)    Past Surgical History  Procedure Laterality Date  . Rotator cuff repair    . Cholecystectomy N/A 02/23/2013    Procedure: LAPAROSCOPIC CHOLECYSTECTOMY;  Surgeon: Jamesetta So, MD;  Location: [facility];  Service: General;  Laterality: N/A;  . Ercp N/A 02/24/2013    Procedure: ENDOSCOPIC RETROGRADE CHOLANGIOPANCREATOGRAPHY;  Surgeon: Daneil Dolin, MD;  Location: [facility];  Service: Endoscopy;  Laterality: N/A;   Current Outpatient Prescriptions on File Prior to Visit  Medication Sig Dispense Refill  . ADVAIR DISKUS 250-50 MCG/DOSE AEPB INHALE 1 PUFF INTO THE LUNGS EVERY 12 (TWELVE) HOURS.  60 each  2  . amLODipine (NORVASC) 2.5 MG tablet TAKE 1 TABLET (2.5 MG TOTAL) BY MOUTH DAILY.  30 tablet  11  . aspirin EC 81 MG tablet Take 81 mg by mouth daily.      . cefUROXime (CEFTIN) 250 MG tablet Take 250 mg by mouth 2 (two) times daily with a  meal.      . ipratropium (ATROVENT) 0.02 % nebulizer solution Take 0.5 mg by nebulization 4 (four) times daily.      . pioglitazone (ACTOS) 30 MG tablet TAKE 1 TABLET (30 MG TOTAL) BY MOUTH DAILY.  30 tablet  5   No current facility-administered medications on file prior to visit.   Allergies  Allergen Reactions  . Ace Inhibitors    History   Social History  . Marital Status: Married    Spouse Name: [name]    Number of Children: 6  . Years of Education: 11th   Occupational History  . Retired    Social History Main Topics  . Smoking status: Never Smoker   . Smokeless tobacco: Never Used  . Alcohol Use: No  . Drug Use: No  . Sexual Activity: Not on file   Other Topics Concern  . Not on file   Social History Narrative   Patient lives at home with family.   Caffeine Use: 1 cup of coffee     Review of Systems  Objective:   Physical Exam  Vitals reviewed. Constitutional: He appears well-developed and well-nourished.  Cardiovascular: Normal rate, regular rhythm, normal heart sounds and intact distal pulses.   No murmur heard. Pulmonary/Chest: Effort normal. He has no decreased breath sounds. He has no wheezes. He has no rhonchi. He has no rales.  Abdominal: Soft. Bowel sounds are normal. He exhibits no distension and no mass. There is no tenderness. There is no rebound and no guarding.  Musculoskeletal: He exhibits no edema.          Assessment & Plan:   1. Recurrent right pleural effusion Clinically the right pleural effusion has resolved. There is no evidence on his physical exam of decreased breath sounds in the right base. He is completely asymptomatic. I would like to repeat an x-ray but the patient is scheduled to see his pulmonologist next week. Therefore I will defer the xray until he sees Dr. Lamonte Sakai next week.  Patient has had a hemoglobin A1c in June which was excellent at 6.6. Therefore he can followup with me for his chronic medical problems in  September or October. Otherwise followup if necessary.

## 2014-01-27 ENCOUNTER — Telehealth: Payer: Self-pay | Admitting: Family Medicine

## 2014-01-27 NOTE — Telephone Encounter (Signed)
LM with pts daughter to call back to schedule [facility] and CPE

## 2014-01-29 ENCOUNTER — Encounter: Payer: Self-pay | Admitting: Emergency Medicine

## 2014-01-29 ENCOUNTER — Ambulatory Visit (INDEPENDENT_AMBULATORY_CARE_PROVIDER_SITE_OTHER): Payer: Medicare Other | Admitting: Emergency Medicine

## 2014-01-29 ENCOUNTER — Ambulatory Visit (INDEPENDENT_AMBULATORY_CARE_PROVIDER_SITE_OTHER)
Admission: RE | Admit: 2014-01-29 | Discharge: 2014-01-29 | Disposition: A | Discharge status: Home / Self Care | Payer: Medicare Other | Source: Ambulatory Visit | Attending: Emergency Medicine | Admitting: Emergency Medicine

## 2014-01-29 VITALS — BP 142/88 | HR 83 | Ht 72.0 in | Wt 221.2 lb

## 2014-01-29 DIAGNOSIS — J9811 Atelectasis: Secondary | ICD-10-CM | POA: Insufficient documentation

## 2014-01-29 DIAGNOSIS — J90 Pleural effusion, not elsewhere classified: Secondary | ICD-10-CM

## 2014-01-29 DIAGNOSIS — R9389 Abnormal findings on diagnostic imaging of other specified body structures: Secondary | ICD-10-CM

## 2014-01-29 NOTE — Progress Notes (Signed)
   Subjective:    Patient ID: [name], male    DOB: 1936/01/17, 78 y.o.   MRN: [id number]  HPI 78 yo former smoker (very remote), hx of HTN, DM, CVA. Carries the dx of COPD / Asthma although never smoker. He was admitted to [facility] with slowly progressive dyspnea mid-May. Found to have a R pleural effusion > thora  Pleural fluid 11/25/13 > alb 3.0, TP 5.3, glu 162, LDH 159.  Cytology negative, WBC 1378 predominant lymphs no PMN, cx negative  ROV 01/29/14 -- follows for a R effusion and abnormal CT scan chest. The pleural effusion was well drained on 6/18, allows visualization of a rounded RLL opacity that looks like Atx, consider also malignancy but less likely. The repeat cytology is negative for malignancy. Returns today with a CXR. He improved with the thora and has not declined since then.     I think at this point we can follow him for re-accumulation. It the effusion returns then we will likely pursue repeat thora + biopsy or pleuroscopy. He may merit FOB or VATS to sample the RLL region of consolidation. If the fluid does not reaccumulate then I will plan to repeat his Ct scan in 2-3 months. Please schedule him to come back to see me in 2-3 weeks with a CXR.  Review of Systems  Constitutional: Positive for appetite change. Negative for fever.  HENT: Negative for congestion, dental problem, ear pain, nosebleeds, postnasal drip, rhinorrhea, sinus pressure, sneezing, sore throat and trouble swallowing.   Eyes: Negative for redness and itching.  Respiratory: Positive for cough and shortness of breath. Negative for chest tightness and wheezing.   Cardiovascular: Negative for palpitations and leg swelling.  Gastrointestinal: Negative for nausea and vomiting.  Genitourinary: Negative for dysuria.  Musculoskeletal: Negative for joint swelling.  Skin: Negative for rash.  Neurological: Negative for headaches.  Hematological: Does not bruise/bleed easily.  Psychiatric/Behavioral: Negative for  dysphoric mood. The patient is not nervous/anxious.        Objective:   Physical Exam Filed Vitals:   01/29/14 0931  BP: 142/88  Pulse: 83  Height: 6' (1.829 m)  Weight: 221 lb 3.2 oz (100.336 kg)  SpO2: 94%   Gen: Pleasant, well-nourished, in no distress,  normal affect  ENT: No lesions,  mouth clear,  oropharynx clear, no postnasal drip  Neck: No JVD, no TMG, no carotid bruits  Lungs: No use of accessory muscles, clear without rales or rhonchi, decreased at the R base, no crackles.   Cardiovascular: RRR, heart sounds normal, no murmur or gallops, no peripheral edema  Musculoskeletal: No deformities, no cyanosis or clubbing  Neuro: alert, non focal  Skin: Warm, no lesions or rashes     Assessment & Plan:  Abnormal chest CT Decreased right pleural effusion following thoracentesis on 6/18. He is CT scan at that time and his chest x-ray today shows persistent right lower lobe volume loss. Etiology of this is unclear but it could represent scar, less likely malignancy. Cytology has been negative x2 - we will repeat his CT scan of the chest in mid September - If the right lower lobe abnormality persists or grows then we will discuss modes to biopsy the area - If the right lower lobe abnormality shrinks or disappears he will not need any further followup

## 2014-01-29 NOTE — Assessment & Plan Note (Signed)
Decreased right pleural effusion following thoracentesis on 6/18. He is CT scan at that time and his chest x-ray today shows persistent right lower lobe volume loss. Etiology of this is unclear but it could represent scar, less likely malignancy. Cytology has been negative x2 - we will repeat his CT scan of the chest in mid September - If the right lower lobe abnormality persists or grows then we will discuss modes to biopsy the area - If the right lower lobe abnormality shrinks or disappears he will not need any further followup

## 2014-01-29 NOTE — Patient Instructions (Signed)
Follow up CT mid-September and Dr. Lamonte Sakai after.

## 2014-02-19 ENCOUNTER — Ambulatory Visit: Payer: Medicare Other | Admitting: Family Medicine

## 2014-02-23 ENCOUNTER — Telehealth: Payer: Self-pay | Admitting: Family Medicine

## 2014-02-23 ENCOUNTER — Telehealth: Payer: Self-pay | Admitting: Emergency Medicine

## 2014-02-23 DIAGNOSIS — J90 Pleural effusion, not elsewhere classified: Secondary | ICD-10-CM

## 2014-02-23 NOTE — Telephone Encounter (Signed)
Spoke with spouse. She reports pt has albuterol nebulizer he uses PRN. We did not RX this for pt. They called Dr. Samella Parr office per spouse and was told to call us. She is aware RB not back until Friday and reports this is fine. Please advise if pt still needs to continue on this and if so Rx needs to be sent for new machine. Please advise thanks

## 2014-02-23 NOTE — Telephone Encounter (Signed)
Spoke to [name] and pt has had ned machine for several years and does not remember where she got it from (which doctor). I recommended that she contact his pulmonologist to see if he still wanted him to be using this treatment and how often and if we need to send in for a new machine we will be happy to do so. Pt's wife agreed and will call us back if we need to help with that.

## 2014-02-23 NOTE — Telephone Encounter (Signed)
Patients wife linda calling to let us know that [facility] nebulizer machine broke, would like to know if we can send in rx for this to [facility] if possible  Call back number is [phone]

## 2014-02-25 ENCOUNTER — Ambulatory Visit (INDEPENDENT_AMBULATORY_CARE_PROVIDER_SITE_OTHER): Payer: Medicare Other | Admitting: Family Medicine

## 2014-02-25 ENCOUNTER — Encounter: Payer: Self-pay | Admitting: Family Medicine

## 2014-02-25 VITALS — BP 122/70 | HR 78 | Temp 98.3°F | Resp 18 | Ht 72.0 in | Wt 220.0 lb

## 2014-02-25 DIAGNOSIS — E119 Type 2 diabetes mellitus without complications: Secondary | ICD-10-CM

## 2014-02-25 NOTE — Progress Notes (Signed)
Subjective:    Patient ID: [name], male    DOB: 04-16-1936, 78 y.o.   MRN: [id number]  HPI Patient is here today for followup of his diabetes mellitus type 2.  He is currently taking Actos 30 mg by mouth daily. His fasting blood sugars tend to range between 101 150. He denies any hypoglycemic episodes. His blood pressure is well controlled on amlodipine 2.5 mg by mouth daily. He is also taking aspirin 81 mg by mouth daily for secondary prevention of stroke. The patient is adamant that he will strive again. His wife is concerned since his stroke and his recent hospitalizations and his judgment reaction time are not sufficient to allow him to drive a car safely. I also have some concerns about his memory. Past Medical History  Diagnosis Date  . COPD (chronic obstructive pulmonary disease)   . Hx-TIA (transient ischemic attack)   . Diabetes mellitus   . Hypertension   . Hyperlipidemia   . Anxiety   . CVA (cerebral vascular accident)    Past Surgical History  Procedure Laterality Date  . Rotator cuff repair    . Cholecystectomy N/A 02/23/2013    Procedure: LAPAROSCOPIC CHOLECYSTECTOMY;  Surgeon: Jamesetta So, MD;  Location: [facility];  Service: General;  Laterality: N/A;  . Ercp N/A 02/24/2013    Procedure: ENDOSCOPIC RETROGRADE CHOLANGIOPANCREATOGRAPHY;  Surgeon: Daneil Dolin, MD;  Location: [facility];  Service: Endoscopy;  Laterality: N/A;   Current Outpatient Prescriptions on File Prior to Visit  Medication Sig Dispense Refill  . ADVAIR DISKUS 250-50 MCG/DOSE AEPB INHALE 1 PUFF INTO THE LUNGS EVERY 12 (TWELVE) HOURS.  60 each  2  . amLODipine (NORVASC) 2.5 MG tablet TAKE 1 TABLET (2.5 MG TOTAL) BY MOUTH DAILY.  30 tablet  11  . aspirin EC 81 MG tablet Take 81 mg by mouth daily.      [name] Kitchen ipratropium (ATROVENT) 0.02 % nebulizer solution Take 0.5 mg by nebulization 4 (four) times daily.      . pioglitazone (ACTOS) 30 MG tablet TAKE 1 TABLET (30 MG TOTAL) BY MOUTH DAILY.  30 tablet  5    No current facility-administered medications on file prior to visit.   Allergies  Allergen Reactions  . Ace Inhibitors    History   Social History  . Marital Status: Married    Spouse Name: [name]    Number of Children: 6  . Years of Education: 11th   Occupational History  . Retired    Social History Main Topics  . Smoking status: Never Smoker   . Smokeless tobacco: Never Used  . Alcohol Use: No  . Drug Use: No  . Sexual Activity: Not on file   Other Topics Concern  . Not on file   Social History Narrative   Patient lives at home with family.   Caffeine Use: 1 cup of coffee      Review of Systems  All other systems reviewed and are negative.      Objective:   Physical Exam  Vitals reviewed. Constitutional: He is oriented to person, place, and time.  Neck: Neck supple. No JVD present. No thyromegaly present.  Cardiovascular: Normal rate, regular rhythm and normal heart sounds.   No murmur heard. Pulmonary/Chest: Effort normal and breath sounds normal. No respiratory distress. He has no wheezes. He has no rales.  Abdominal: Soft. Bowel sounds are normal. He exhibits no distension. There is no tenderness. There is no rebound and no guarding.  Lymphadenopathy:    He has no cervical adenopathy.  Neurological: He is alert and oriented to person, place, and time. He has normal reflexes. He displays normal reflexes. No cranial nerve deficit. He exhibits normal muscle tone. Coordination normal.          Assessment & Plan:  1. Type II or unspecified type diabetes mellitus without mention of complication, not stated as uncontrolled Check hemoglobin A1c today as well as urine microalbumin. Patient's diabetic foot exam up-to-date. He is currently taking an aspirin. His blood pressure is acceptable.  His urine microalbumin is elevated, I will start the patient on an angiotensin receptor blocker due to his history of an allergy to ACE inhibitors. Also contact the DMV. I  believe the patient needs a road test prior to being medically cleared to drive.  Patient is scheduled to follow up with his pulmonologist in September to evaluate for resolution of his right pleural effusion - COMPLETE METABOLIC PANEL WITH GFR - Hemoglobin A1c - Microalbumin, urine

## 2014-02-26 LAB — COMPLETE METABOLIC PANEL WITH GFR
ALBUMIN: 4.3 g/dL (ref 3.5–5.2)
ALT: <8 U/L (ref 0–53)
AST: 12 U/L (ref 0–37)
Alkaline Phosphatase: 78 U/L (ref 39–117)
BILIRUBIN TOTAL: 0.4 mg/dL (ref 0.2–1.2)
BUN: 14 mg/dL (ref 6–23)
CO2: 25 meq/L (ref 19–32)
Calcium: 8.9 mg/dL (ref 8.4–10.5)
Chloride: 102 mEq/L (ref 96–112)
Creat: 1.36 mg/dL — ABNORMAL HIGH (ref 0.50–1.35)
GFR, EST AFRICAN AMERICAN: 58 mL/min — AB
GFR, EST NON AFRICAN AMERICAN: 51 mL/min — AB
GLUCOSE: 169 mg/dL — AB (ref 70–99)
Potassium: 3.9 mEq/L (ref 3.5–5.3)
Sodium: 138 mEq/L (ref 135–145)
Total Protein: 8 g/dL (ref 6.0–8.3)

## 2014-02-26 LAB — MICROALBUMIN, URINE: Microalb, Ur: 5.9 mg/dL — ABNORMAL HIGH (ref 0.00–1.89)

## 2014-02-26 LAB — HEMOGLOBIN A1C
HEMOGLOBIN A1C: 6.6 % — AB (ref <5.7)
Mean Plasma Glucose: 143 mg/dL — ABNORMAL HIGH (ref <117)

## 2014-02-26 NOTE — Telephone Encounter (Signed)
Called and spoke with pts wife and she stated that the pt wants to get this through [place] but they wanted to see which medication that [place] wanted the pt on.  RB please advise. Ok to send in order on Monday.  Thanks

## 2014-02-26 NOTE — Telephone Encounter (Signed)
Yes, please send script for the machine and the meds

## 2014-02-27 ENCOUNTER — Other Ambulatory Visit: Payer: Self-pay | Admitting: *Deleted

## 2014-02-27 MED ORDER — LOSARTAN POTASSIUM 50 MG PO TABS: 50.0000 mg | ORAL_TABLET | Freq: Every day | ORAL | Status: DC

## 2014-03-01 MED ORDER — ALBUTEROL SULFATE (2.5 MG/3ML) 0.083% IN NEBU: 2.5000 mg | INHALATION_SOLUTION | RESPIRATORY_TRACT | Status: DC | PRN

## 2014-03-01 NOTE — Telephone Encounter (Signed)
Order sent in for new nebulizer and albuterol per RB.  Nothing further needed.

## 2014-03-01 NOTE — Telephone Encounter (Signed)
Yes ok to send order

## 2014-03-03 ENCOUNTER — Telehealth: Payer: Self-pay | Admitting: Family Medicine

## 2014-03-03 NOTE — Telephone Encounter (Signed)
The form was faxed to [facility] on 12/28/13

## 2014-03-03 NOTE — Telephone Encounter (Signed)
Patients wife linda calling to ask to see if dr pickard has taken care of the dmv issue they were talking about last visit? I looked in the notes and it did say that he needed a driving test, according to wife she said dr pickard was going to take care of this?  Please call her back at [phone]

## 2014-03-04 NOTE — Telephone Encounter (Signed)
The form was also mailed last week and pt's wife informed of this and if she has nog heard of anything to please call our office.

## 2014-03-20 ENCOUNTER — Emergency Department (HOSPITAL_COMMUNITY): Payer: Medicare Other

## 2014-03-20 ENCOUNTER — Encounter (HOSPITAL_COMMUNITY): Payer: Self-pay | Admitting: Emergency Medicine

## 2014-03-20 ENCOUNTER — Emergency Department (HOSPITAL_COMMUNITY)
Admission: EM | Admit: 2014-03-20 | Discharge: 2014-03-21 | Disposition: A | Discharge status: Home / Self Care | Payer: Medicare Other | Attending: Emergency Medicine | Admitting: Emergency Medicine

## 2014-03-20 DIAGNOSIS — J438 Other emphysema: Secondary | ICD-10-CM | POA: Insufficient documentation

## 2014-03-20 DIAGNOSIS — Z79899 Other long term (current) drug therapy: Secondary | ICD-10-CM | POA: Insufficient documentation

## 2014-03-20 DIAGNOSIS — R0602 Shortness of breath: Secondary | ICD-10-CM | POA: Diagnosis present

## 2014-03-20 DIAGNOSIS — E119 Type 2 diabetes mellitus without complications: Secondary | ICD-10-CM | POA: Insufficient documentation

## 2014-03-20 DIAGNOSIS — Z8673 Personal history of transient ischemic attack (TIA), and cerebral infarction without residual deficits: Secondary | ICD-10-CM | POA: Insufficient documentation

## 2014-03-20 DIAGNOSIS — I10 Essential (primary) hypertension: Secondary | ICD-10-CM | POA: Insufficient documentation

## 2014-03-20 DIAGNOSIS — Z7982 Long term (current) use of aspirin: Secondary | ICD-10-CM | POA: Diagnosis not present

## 2014-03-20 DIAGNOSIS — Z8659 Personal history of other mental and behavioral disorders: Secondary | ICD-10-CM | POA: Diagnosis not present

## 2014-03-20 LAB — CBC WITH DIFFERENTIAL/PLATELET
BASOS ABS: 0 10*3/uL (ref 0.0–0.1)
BASOS PCT: 1 % (ref 0–1)
EOS PCT: 9 % — AB (ref 0–5)
Eosinophils Absolute: 0.5 10*3/uL (ref 0.0–0.7)
HCT: 34.5 % — ABNORMAL LOW (ref 39.0–52.0)
Hemoglobin: 11.9 g/dL — ABNORMAL LOW (ref 13.0–17.0)
LYMPHS PCT: 23 % (ref 12–46)
Lymphs Abs: 1.4 10*3/uL (ref 0.7–4.0)
MCH: 29.8 pg (ref 26.0–34.0)
MCHC: 34.5 g/dL (ref 30.0–36.0)
MCV: 86.5 fL (ref 78.0–100.0)
MONO ABS: 0.4 10*3/uL (ref 0.1–1.0)
Monocytes Relative: 7 % (ref 3–12)
Neutro Abs: 3.5 10*3/uL (ref 1.7–7.7)
Neutrophils Relative %: 60 % (ref 43–77)
PLATELETS: 229 10*3/uL (ref 150–400)
RBC: 3.99 MIL/uL — ABNORMAL LOW (ref 4.22–5.81)
RDW: 13.3 % (ref 11.5–15.5)
WBC: 5.8 10*3/uL (ref 4.0–10.5)

## 2014-03-20 LAB — BASIC METABOLIC PANEL
ANION GAP: 12 (ref 5–15)
BUN: 14 mg/dL (ref 6–23)
CALCIUM: 8.9 mg/dL (ref 8.4–10.5)
CO2: 24 mEq/L (ref 19–32)
CREATININE: 1.22 mg/dL (ref 0.50–1.35)
Chloride: 103 mEq/L (ref 96–112)
GFR, EST AFRICAN AMERICAN: 65 mL/min — AB (ref >90)
GFR, EST NON AFRICAN AMERICAN: 56 mL/min — AB (ref >90)
Glucose, Bld: 157 mg/dL — ABNORMAL HIGH (ref 70–99)
Potassium: 3.6 mEq/L — ABNORMAL LOW (ref 3.7–5.3)
Sodium: 139 mEq/L (ref 137–147)

## 2014-03-20 MED ORDER — ALBUTEROL SULFATE (2.5 MG/3ML) 0.083% IN NEBU
2.5000 mg | INHALATION_SOLUTION | Freq: Once | RESPIRATORY_TRACT | Status: AC
  Administered 2014-03-20: 2.5 mg via RESPIRATORY_TRACT
  Filled 2014-03-20: qty 3

## 2014-03-20 MED ORDER — FLUTICASONE-SALMETEROL 250-50 MCG/DOSE IN AEPB
1.0000 | INHALATION_SPRAY | Freq: Two times a day (BID) | RESPIRATORY_TRACT | Status: DC
Start: 2014-03-20 — End: 2014-04-01

## 2014-03-20 MED ORDER — IPRATROPIUM-ALBUTEROL 0.5-2.5 (3) MG/3ML IN SOLN
3.0000 mL | Freq: Once | RESPIRATORY_TRACT | Status: AC
  Administered 2014-03-20: 3 mL via RESPIRATORY_TRACT
  Filled 2014-03-20: qty 3

## 2014-03-20 MED ORDER — ALBUTEROL SULFATE (2.5 MG/3ML) 0.083% IN NEBU
5.0000 mg | INHALATION_SOLUTION | Freq: Once | RESPIRATORY_TRACT | Status: AC
  Administered 2014-03-20: 5 mg via RESPIRATORY_TRACT
  Filled 2014-03-20: qty 6

## 2014-03-20 MED ORDER — METHYLPREDNISOLONE SODIUM SUCC 125 MG IJ SOLR
125.0000 mg | Freq: Once | INTRAMUSCULAR | Status: AC
  Administered 2014-03-20: 125 mg via INTRAVENOUS
  Filled 2014-03-20: qty 2

## 2014-03-20 MED ORDER — ALBUTEROL SULFATE (2.5 MG/3ML) 0.083% IN NEBU: 2.5000 mg | INHALATION_SOLUTION | RESPIRATORY_TRACT | Status: DC | PRN

## 2014-03-20 NOTE — Discharge Instructions (Signed)
Follow up with your md in 1-2 weeks. °

## 2014-03-20 NOTE — Progress Notes (Signed)
Pt states coughing up thick sputum, white when it comes up. Becomes sob with walking more than usual.

## 2014-03-20 NOTE — ED Provider Notes (Signed)
CSN: [id number]     Arrival date & time 03/20/14  2208 History   First MD Initiated Contact with Patient 03/20/14 2220    This chart was scribed for Maudry Diego, MD by Terressa Koyanagi, ED Scribe. This patient was seen in room [facility] and the patient's care was started at 10:22 PM.  Chief Complaint  Patient presents with  . Shortness of Breath   Patient is a 78 y.o. male presenting with shortness of breath. The history is provided by the patient. No language interpreter was used.  Shortness of Breath Severity:  Moderate Onset quality:  Sudden Duration:  1 day Timing:  Constant Chronicity:  Recurrent Associated symptoms: cough   Associated symptoms: no abdominal pain, no chest pain, no fever, no headaches and no rash    PCP: Odette Fraction, MD  HPI Comments: DORRANCE SELLICK is a 78 y.o. male, with medical Hx noted below and significant for COPD who presents to the Emergency Department complaining of SOB and associated non-productive cough onset today. Pt reports he has been admitted to the hospital for similar Sx before.   Past Medical History  Diagnosis Date  . COPD (chronic obstructive pulmonary disease)   . Hx-TIA (transient ischemic attack)   . Diabetes mellitus   . Hypertension   . Hyperlipidemia   . Anxiety   . CVA (cerebral vascular accident)    Past Surgical History  Procedure Laterality Date  . Rotator cuff repair    . Cholecystectomy N/A 02/23/2013    Procedure: LAPAROSCOPIC CHOLECYSTECTOMY;  Surgeon: Jamesetta So, MD;  Location: [facility];  Service: General;  Laterality: N/A;  . Ercp N/A 02/24/2013    Procedure: ENDOSCOPIC RETROGRADE CHOLANGIOPANCREATOGRAPHY;  Surgeon: Daneil Dolin, MD;  Location: [facility];  Service: Endoscopy;  Laterality: N/A;   Family History  Problem Relation Age of Onset  . Colon cancer Neg Hx   . Hypertension Mother   . Hypertension Father    History  Substance Use Topics  . Smoking status: Never Smoker   . Smokeless tobacco: Never  Used  . Alcohol Use: No    Review of Systems  Constitutional: Negative for fever, appetite change and fatigue.  HENT: Negative for congestion, ear discharge and sinus pressure.   Eyes: Negative for discharge.  Respiratory: Positive for cough and shortness of breath.   Cardiovascular: Negative for chest pain.  Gastrointestinal: Negative for abdominal pain and diarrhea.  Genitourinary: Negative for frequency and hematuria.  Musculoskeletal: Negative for back pain.  Skin: Negative for rash.  Neurological: Negative for seizures and headaches.  Psychiatric/Behavioral: Negative for hallucinations and confusion.      Allergies  Ace inhibitors  Home Medications   Prior to Admission medications   Medication Sig Start Date End Date Taking? Authorizing Provider  ADVAIR DISKUS 250-50 MCG/DOSE AEPB INHALE 1 PUFF INTO THE LUNGS EVERY 12 (TWELVE) HOURS. 01/13/14   Susy Frizzle, MD  albuterol (PROVENTIL) (2.5 MG/3ML) 0.083% nebulizer solution Take 3 mLs (2.5 mg total) by nebulization every 4 (four) hours as needed for wheezing or shortness of breath. 03/01/14   Collene Gobble, MD  aspirin EC 81 MG tablet Take 81 mg by mouth daily.    Historical Provider, MD  ipratropium (ATROVENT) 0.02 % nebulizer solution Take 0.5 mg by nebulization 4 (four) times daily.    Historical Provider, MD  losartan (COZAAR) 50 MG tablet Take 1 tablet (50 mg total) by mouth daily. 02/27/14   Susy Frizzle, MD  pioglitazone (ACTOS)  30 MG tablet TAKE 1 TABLET (30 MG TOTAL) BY MOUTH DAILY.    Susy Frizzle, MD   Triage Vitals: BP 161/101  Pulse 85  Temp(Src) 98.7 F (37.1 C) (Oral)  Resp 24  Ht 6' (1.829 m)  Wt 215 lb (97.523 kg)  BMI 29.15 kg/m2  SpO2 98% Physical Exam  Constitutional: He is oriented to person, place, and time. He appears well-developed.  HENT:  Head: Normocephalic.  Eyes: Conjunctivae and EOM are normal. No scleral icterus.  Neck: Neck supple. No thyromegaly present.  Cardiovascular:  Normal rate and regular rhythm.  Exam reveals no gallop and no friction rub.   No murmur heard. Pulmonary/Chest: No stridor. He has wheezes (mild wheezing bilaterally). He has no rales. He exhibits no tenderness.  Abdominal: He exhibits no distension. There is no tenderness. There is no rebound.  Musculoskeletal: Normal range of motion. He exhibits no edema.  Lymphadenopathy:    He has no cervical adenopathy.  Neurological: He is oriented to person, place, and time. He exhibits normal muscle tone. Coordination normal.  Skin: No rash noted. No erythema.  Psychiatric: He has a normal mood and affect. His behavior is normal.    ED Course  Procedures (including critical care time) DIAGNOSTIC STUDIES: Oxygen Saturation is 98% on RA, nl by my interpretation.    COORDINATION OF CARE: 10:25. PM-Discussed treatment plan which includes meds, imaging, labs and EKG with pt at bedside and pt agreed to plan.   Labs Review Labs Reviewed - No data to display  Imaging Review No results found.   EKG Interpretation None      MDM   Final diagnoses:  None  pt improved with tx The chart was scribed for me under my direct supervision.  I personally performed the history, physical, and medical decision making and all procedures in the evaluation of this patient.Maudry Diego, MD 03/20/14 [phone]

## 2014-03-20 NOTE — ED Notes (Signed)
Pt c/o SOB since this morning. States he has COPD and feels like he is having a hard time getting a deep breath in.

## 2014-03-24 ENCOUNTER — Ambulatory Visit (HOSPITAL_COMMUNITY)
Admission: RE | Admit: 2014-03-24 | Discharge: 2014-03-24 | Disposition: A | Discharge status: Home / Self Care | Payer: Medicare Other | Source: Ambulatory Visit | Attending: Emergency Medicine | Admitting: Emergency Medicine

## 2014-03-24 DIAGNOSIS — I2510 Atherosclerotic heart disease of native coronary artery without angina pectoris: Secondary | ICD-10-CM | POA: Insufficient documentation

## 2014-03-24 DIAGNOSIS — J449 Chronic obstructive pulmonary disease, unspecified: Secondary | ICD-10-CM | POA: Diagnosis not present

## 2014-03-24 DIAGNOSIS — R05 Cough: Secondary | ICD-10-CM | POA: Insufficient documentation

## 2014-03-24 DIAGNOSIS — D3500 Benign neoplasm of unspecified adrenal gland: Secondary | ICD-10-CM | POA: Diagnosis not present

## 2014-03-24 DIAGNOSIS — E119 Type 2 diabetes mellitus without complications: Secondary | ICD-10-CM | POA: Insufficient documentation

## 2014-03-24 DIAGNOSIS — J90 Pleural effusion, not elsewhere classified: Secondary | ICD-10-CM | POA: Diagnosis present

## 2014-03-24 DIAGNOSIS — J984 Other disorders of lung: Secondary | ICD-10-CM | POA: Insufficient documentation

## 2014-03-24 DIAGNOSIS — R059 Cough, unspecified: Secondary | ICD-10-CM | POA: Insufficient documentation

## 2014-03-24 DIAGNOSIS — I10 Essential (primary) hypertension: Secondary | ICD-10-CM | POA: Insufficient documentation

## 2014-03-24 DIAGNOSIS — R0602 Shortness of breath: Secondary | ICD-10-CM | POA: Diagnosis not present

## 2014-03-24 DIAGNOSIS — J4489 Other specified chronic obstructive pulmonary disease: Secondary | ICD-10-CM | POA: Insufficient documentation

## 2014-03-30 ENCOUNTER — Telehealth: Payer: Self-pay | Admitting: Emergency Medicine

## 2014-03-30 NOTE — Telephone Encounter (Signed)
Pt already has appt set for 04/01/14 to review the ct chest results  I called and advised spouse to wait until then for the results  She verbalized understanding and okay with this  Nothing further needed

## 2014-04-01 ENCOUNTER — Encounter: Payer: Self-pay | Admitting: Emergency Medicine

## 2014-04-01 ENCOUNTER — Ambulatory Visit (INDEPENDENT_AMBULATORY_CARE_PROVIDER_SITE_OTHER): Payer: Medicare Other | Admitting: Emergency Medicine

## 2014-04-01 VITALS — BP 110/80 | HR 90 | Temp 98.3°F | Ht 72.0 in | Wt 222.0 lb

## 2014-04-01 DIAGNOSIS — R918 Other nonspecific abnormal finding of lung field: Secondary | ICD-10-CM

## 2014-04-01 DIAGNOSIS — R9389 Abnormal findings on diagnostic imaging of other specified body structures: Secondary | ICD-10-CM

## 2014-04-01 NOTE — Patient Instructions (Signed)
Please continue your Advair twice a day  We will perform a PET scan to further evaluate your right lung Follow with Dr Lamonte Sakai next available appointment after the PET to discuss the results.

## 2014-04-01 NOTE — Progress Notes (Signed)
   Subjective:    Patient ID: [name], male    DOB: 1936/03/17, 78 y.o.   MRN: [id number]  HPI 78 yo former smoker (very remote), hx of HTN, DM, CVA. Carries the dx of COPD / Asthma although never smoker. He was admitted to [facility] with slowly progressive dyspnea mid-May. Found to have a R pleural effusion > thora  Pleural fluid 11/25/13 > alb 3.0, TP 5.3, glu 162, LDH 159.  Cytology negative, WBC 1378 predominant lymphs no PMN, cx negative  ROV 01/29/14 -- follows for a R effusion and abnormal CT scan chest. The pleural effusion was well drained on 6/18, allows visualization of a rounded RLL opacity that looks like Atx, consider also malignancy but less likely. The repeat cytology is negative for malignancy. Returns today with a CXR. He improved with the thora and has not declined since then.   I think at this point we Newman follow him for re-accumulation. It the effusion returns then we will likely pursue repeat thora + biopsy or pleuroscopy. He may merit FOB or VATS to sample the RLL region of consolidation. If the fluid does not reaccumulate then I will plan to repeat his Ct scan in 2-3 months. Please schedule him to come back to see me in 2-3 weeks with a CXR.  ROV 04/01/14 -- R effusion and abnormal area of rounded RLL infiltrate. Repeat CT scan 9/16 >> the RLL opacity is more rounded, has caused some basilar volume loss. No clear mass seen. He has felt well - has had cough and associated dyspnea. He relates this to missing his Advair. No sputum or hemoptysis  Review of Systems  Constitutional: Positive for appetite change. Negative for fever.  HENT: Negative for congestion, dental problem, ear pain, nosebleeds, postnasal drip, rhinorrhea, sinus pressure, sneezing, sore throat and trouble swallowing.   Eyes: Negative for redness and itching.  Respiratory: Positive for cough and shortness of breath. Negative for chest tightness and wheezing.   Cardiovascular: Negative for palpitations and leg  swelling.  Gastrointestinal: Negative for nausea and vomiting.  Genitourinary: Negative for dysuria.  Musculoskeletal: Negative for joint swelling.  Skin: Negative for rash.  Neurological: Negative for headaches.  Hematological: Does not bruise/bleed easily.  Psychiatric/Behavioral: Negative for dysphoric mood. The patient is not nervous/anxious.        Objective:   Physical Exam Filed Vitals:   04/01/14 0956  BP: 110/80  Pulse: 90  Temp: 98.3 F (36.8 C)  TempSrc: Oral  Height: 6' (1.829 m)  Weight: 222 lb (100.699 kg)  SpO2: 99%   Gen: Pleasant, well-nourished, in no distress,  normal affect  ENT: No lesions,  mouth clear,  oropharynx clear, no postnasal drip  Neck: No JVD, no TMG, no carotid bruits  Lungs: No use of accessory muscles, clear without rales or rhonchi, decreased at the R base, no crackles.   Cardiovascular: RRR, heart sounds normal, no murmur or gallops, no peripheral edema  Musculoskeletal: No deformities, no cyanosis or clubbing  Neuro: alert, non focal  Skin: Warm, no lesions or rashes     Assessment & Plan:  Abnormal chest CT I suspect that his RLL opacity is rounded atx from his recent PNA. He is asymptomatic. The size is concerning - we are compelled to follow this further to insure no evidence malignancy. Will perform PET first. If negative then we Newman likely avoid biopsy. If indicated we will perform needle bx vs FOB. Follow up next available.

## 2014-04-01 NOTE — Assessment & Plan Note (Addendum)
I suspect that his RLL opacity is rounded atx from his recent PNA. He is asymptomatic. The size is concerning - we are compelled to follow this further to insure no evidence malignancy. Will perform PET first. If negative then we can likely avoid biopsy. If indicated we will perform needle bx vs FOB. Follow up next available.

## 2014-04-06 ENCOUNTER — Encounter (HOSPITAL_COMMUNITY)
Admission: RE | Admit: 2014-04-06 | Discharge: 2014-04-06 | Disposition: A | Discharge status: Home / Self Care | Payer: Medicare Other | Source: Ambulatory Visit | Attending: Diagnostic Radiology | Admitting: Diagnostic Radiology

## 2014-04-06 DIAGNOSIS — J13 Pneumonia due to Streptococcus pneumoniae: Secondary | ICD-10-CM | POA: Diagnosis not present

## 2014-04-06 DIAGNOSIS — R918 Other nonspecific abnormal finding of lung field: Secondary | ICD-10-CM

## 2014-04-06 DIAGNOSIS — R599 Enlarged lymph nodes, unspecified: Secondary | ICD-10-CM | POA: Insufficient documentation

## 2014-04-06 DIAGNOSIS — I7090 Unspecified atherosclerosis: Secondary | ICD-10-CM | POA: Diagnosis not present

## 2014-04-06 DIAGNOSIS — I2510 Atherosclerotic heart disease of native coronary artery without angina pectoris: Secondary | ICD-10-CM | POA: Insufficient documentation

## 2014-04-06 DIAGNOSIS — J90 Pleural effusion, not elsewhere classified: Secondary | ICD-10-CM | POA: Insufficient documentation

## 2014-04-06 DIAGNOSIS — J181 Lobar pneumonia, unspecified organism: Secondary | ICD-10-CM | POA: Insufficient documentation

## 2014-04-06 LAB — GLUCOSE, CAPILLARY: GLUCOSE-CAPILLARY: 163 mg/dL — AB (ref 70–99)

## 2014-04-06 MED ORDER — FLUDEOXYGLUCOSE F - 18 (FDG) INJECTION
9.7600 | Freq: Once | INTRAVENOUS | Status: AC | PRN
  Administered 2014-04-06: 9.76 via INTRAVENOUS

## 2014-04-09 ENCOUNTER — Ambulatory Visit: Payer: Medicare Other | Admitting: Emergency Medicine

## 2014-04-21 ENCOUNTER — Ambulatory Visit (INDEPENDENT_AMBULATORY_CARE_PROVIDER_SITE_OTHER): Payer: Medicare Other | Admitting: Emergency Medicine

## 2014-04-21 ENCOUNTER — Encounter: Payer: Self-pay | Admitting: Emergency Medicine

## 2014-04-21 VITALS — BP 138/96 | HR 87 | Temp 97.0°F | Ht 72.0 in | Wt 227.0 lb

## 2014-04-21 DIAGNOSIS — J9811 Atelectasis: Secondary | ICD-10-CM

## 2014-04-21 DIAGNOSIS — Z23 Encounter for immunization: Secondary | ICD-10-CM

## 2014-04-21 NOTE — Progress Notes (Signed)
   Subjective:    Patient ID: [name], male    DOB: 1936-04-14, 78 y.o.   MRN: [id number]  HPI 78 yo former smoker (very remote), hx of HTN, DM, CVA. Carries the dx of COPD / Asthma although never smoker. He was admitted to [facility] with slowly progressive dyspnea mid-May. Found to have a R pleural effusion > thora  Pleural fluid 11/25/13 > alb 3.0, TP 5.3, glu 162, LDH 159.  Cytology negative, WBC 1378 predominant lymphs no PMN, cx negative  ROV 01/29/14 -- follows for a R effusion and abnormal CT scan chest. The pleural effusion was well drained on 6/18, allows visualization of a rounded RLL opacity that looks like Atx, consider also malignancy but less likely. The repeat cytology is negative for malignancy. Returns today with a CXR. He improved with the thora and has not declined since then.   I think at this point we can follow him for re-accumulation. It the effusion returns then we will likely pursue repeat thora + biopsy or pleuroscopy. He may merit FOB or VATS to sample the RLL region of consolidation. If the fluid does not reaccumulate then I will plan to repeat his Ct scan in 2-3 months. Please schedule him to come back to see me in 2-3 weeks with a CXR.  ROV 04/01/14 -- R effusion and abnormal area of rounded RLL infiltrate. Repeat CT scan 9/16 >> the RLL opacity is more rounded, has caused some basilar volume loss. No clear mass seen. He has felt well - has had cough and associated dyspnea. He relates this to missing his Advair. No sputum or hemoptysis  ROV 04/21/14 -- follow up for RLL rounded infiltrate vs mass. His PET scan was reassuring - The RLL lesion looks like rounded atelectasis.   Review of Systems  Constitutional: Negative for fever and appetite change.  HENT: Negative for congestion, dental problem, ear pain, nosebleeds, postnasal drip, rhinorrhea, sinus pressure, sneezing, sore throat and trouble swallowing.   Eyes: Negative for redness and itching.  Respiratory: Positive  for cough. Negative for chest tightness, shortness of breath and wheezing.   Cardiovascular: Negative for palpitations and leg swelling.  Gastrointestinal: Negative for nausea and vomiting.  Genitourinary: Negative for dysuria.  Musculoskeletal: Negative for joint swelling.  Skin: Negative for rash.  Neurological: Negative for headaches.  Hematological: Does not bruise/bleed easily.  Psychiatric/Behavioral: Negative for dysphoric mood. The patient is not nervous/anxious.        Objective:   Physical Exam Filed Vitals:   04/21/14 1332  BP: 138/96  Pulse: 87  Temp: 97 F (36.1 C)  TempSrc: Oral  Height: 6' (1.829 m)  Weight: 227 lb (102.967 kg)  SpO2: 98%   Gen: Pleasant, well-nourished, in no distress,  normal affect  ENT: No lesions,  mouth clear,  oropharynx clear, no postnasal drip  Neck: No JVD, no TMG, no carotid bruits  Lungs: No use of accessory muscles, clear without rales or rhonchi, decreased at the R base, no crackles.   Cardiovascular: RRR, heart sounds normal, no murmur or gallops, no peripheral edema  Musculoskeletal: No deformities, no cyanosis or clubbing  Neuro: alert, non focal  Skin: Warm, no lesions or rashes     Assessment & Plan:  Atelectasis of right lung The CT scan and subsequent PET scan are most consistent with rounded atelectasis. I would like to repeat the CT scan in 1 year, sooner if he evolves any pulmonary sx.

## 2014-04-21 NOTE — Patient Instructions (Signed)
Your PET scan does not show any evidence of inflammation or cancer in your right lung lesion - this is most consistent with lung scarring.  We will repeat your CT scan of the chest in September 2016.  Follow with Dr Lamonte Sakai in September 2016 to review that CT scan.  Call sooner if you develop any breathing or chest symptoms

## 2014-04-21 NOTE — Assessment & Plan Note (Signed)
The CT scan and subsequent PET scan are most consistent with rounded atelectasis. I would like to repeat the CT scan in 1 year, sooner if he evolves any pulmonary sx.

## 2014-06-08 ENCOUNTER — Encounter: Payer: Self-pay | Admitting: Family Medicine

## 2014-06-08 ENCOUNTER — Telehealth: Payer: Self-pay | Admitting: *Deleted

## 2014-06-08 ENCOUNTER — Ambulatory Visit (INDEPENDENT_AMBULATORY_CARE_PROVIDER_SITE_OTHER): Payer: Medicare Other | Admitting: Family Medicine

## 2014-06-08 VITALS — BP 128/64 | HR 80 | Temp 98.8°F | Resp 18 | Ht 70.0 in | Wt 226.0 lb

## 2014-06-08 DIAGNOSIS — Z0000 Encounter for general adult medical examination without abnormal findings: Secondary | ICD-10-CM

## 2014-06-08 DIAGNOSIS — Z125 Encounter for screening for malignant neoplasm of prostate: Secondary | ICD-10-CM

## 2014-06-08 DIAGNOSIS — E1165 Type 2 diabetes mellitus with hyperglycemia: Secondary | ICD-10-CM

## 2014-06-08 DIAGNOSIS — Z23 Encounter for immunization: Secondary | ICD-10-CM

## 2014-06-08 DIAGNOSIS — IMO0002 Reserved for concepts with insufficient information to code with codable children: Secondary | ICD-10-CM

## 2014-06-08 LAB — CBC WITH DIFFERENTIAL/PLATELET
BASOS ABS: 0 10*3/uL (ref 0.0–0.1)
BASOS PCT: 0 % (ref 0–1)
EOS ABS: 0.4 10*3/uL (ref 0.0–0.7)
EOS PCT: 5 % (ref 0–5)
HCT: 35.9 % — ABNORMAL LOW (ref 39.0–52.0)
Hemoglobin: 12.5 g/dL — ABNORMAL LOW (ref 13.0–17.0)
Lymphocytes Relative: 22 % (ref 12–46)
Lymphs Abs: 1.7 10*3/uL (ref 0.7–4.0)
MCH: 29.3 pg (ref 26.0–34.0)
MCHC: 34.8 g/dL (ref 30.0–36.0)
MCV: 84.1 fL (ref 78.0–100.0)
MONO ABS: 0.5 10*3/uL (ref 0.1–1.0)
MPV: 8.7 fL — AB (ref 9.4–12.4)
Monocytes Relative: 7 % (ref 3–12)
Neutro Abs: 5 10*3/uL (ref 1.7–7.7)
Neutrophils Relative %: 66 % (ref 43–77)
PLATELETS: 231 10*3/uL (ref 150–400)
RBC: 4.27 MIL/uL (ref 4.22–5.81)
RDW: 14 % (ref 11.5–15.5)
WBC: 7.5 10*3/uL (ref 4.0–10.5)

## 2014-06-08 LAB — COMPLETE METABOLIC PANEL WITH GFR
ALT: 10 U/L (ref 0–53)
AST: 14 U/L (ref 0–37)
Albumin: 4 g/dL (ref 3.5–5.2)
Alkaline Phosphatase: 83 U/L (ref 39–117)
BILIRUBIN TOTAL: 0.5 mg/dL (ref 0.2–1.2)
BUN: 14 mg/dL (ref 6–23)
CALCIUM: 8.7 mg/dL (ref 8.4–10.5)
CO2: 25 mEq/L (ref 19–32)
CREATININE: 1.4 mg/dL — AB (ref 0.50–1.35)
Chloride: 104 mEq/L (ref 96–112)
GFR, EST AFRICAN AMERICAN: 56 mL/min — AB
GFR, Est Non African American: 48 mL/min — ABNORMAL LOW
Glucose, Bld: 219 mg/dL — ABNORMAL HIGH (ref 70–99)
Potassium: 3.9 mEq/L (ref 3.5–5.3)
Sodium: 140 mEq/L (ref 135–145)
Total Protein: 7.3 g/dL (ref 6.0–8.3)

## 2014-06-08 LAB — HEMOGLOBIN A1C
HEMOGLOBIN A1C: 7.4 % — AB (ref <5.7)
MEAN PLASMA GLUCOSE: 166 mg/dL — AB (ref <117)

## 2014-06-08 LAB — LIPID PANEL
Cholesterol: 135 mg/dL (ref 0–200)
HDL: 35 mg/dL — AB (ref >39)
LDL Cholesterol: 72 mg/dL (ref 0–99)
Total CHOL/HDL Ratio: 3.9 Ratio
Triglycerides: 141 mg/dL (ref <150)
VLDL: 28 mg/dL (ref 0–40)

## 2014-06-08 LAB — MICROALBUMIN, URINE: MICROALB UR: 4.5 mg/dL — AB (ref <2.0)

## 2014-06-08 NOTE — Telephone Encounter (Signed)
Filled out forms for [employer] and routed to Dr. Dennard Schaumann.

## 2014-06-08 NOTE — Addendum Note (Signed)
Addended by: Shary Decamp B on: 06/08/2014 01:06 PM   Modules accepted: Orders

## 2014-06-08 NOTE — Progress Notes (Signed)
Subjective:    Patient ID: [name], male    DOB: [birth year], 78 y.o.   MRN: [id number]  HPI Patient is here today for complete physical exam. He was recently released by his pulmonologist for 12 months. Patient originally had a significant right-sided pleural effusion requiring therapeutic thoracentesis twice. This revealed an abnormal spot in his right lung. Follow-up CT scans and PET scans were reassuring and the presumed cause is atelectasis in the right lower lung. Overall he is doing well with no shortness of breath. Patient has a history of diabetes mellitus although he is not checking his sugars regularly. He is overdue for an eye exam. He is due today for a diabetic foot exam  Regarding his physical exam, he is overdue for a colonoscopy as well as a prostate exam. The patient refuses colonoscopy but he will consent to a prostate exam. His flu shot is up-to-date however he is due for Pneumovax 23. He declines a tetanus shot and shingles shot. Is also due for diabetic eye exam but states that he will schedule this on his own and declines my offer to schedule the appt for him. Past Medical History  Diagnosis Date  . COPD (chronic obstructive pulmonary disease)   . Hx-TIA (transient ischemic attack)   . Diabetes mellitus   . Hypertension   . Hyperlipidemia   . Anxiety   . CVA (cerebral vascular accident)    Past Surgical History  Procedure Laterality Date  . Rotator cuff repair    . Cholecystectomy N/A 02/23/2013    Procedure: LAPAROSCOPIC CHOLECYSTECTOMY;  Surgeon: Jamesetta So, MD;  Location: [facility];  Service: General;  Laterality: N/A;  . Ercp N/A 02/24/2013    Procedure: ENDOSCOPIC RETROGRADE CHOLANGIOPANCREATOGRAPHY;  Surgeon: Daneil Dolin, MD;  Location: [facility];  Service: Endoscopy;  Laterality: N/A;   Current Outpatient Prescriptions on File Prior to Visit  Medication Sig Dispense Refill  . albuterol (PROVENTIL) (2.5 MG/3ML) 0.083% nebulizer solution Take 3 mLs (2.5 mg  total) by nebulization every 4 (four) hours as needed for wheezing or shortness of breath. 30 vial 0  . aspirin EC 81 MG tablet Take 81 mg by mouth daily.    . Fluticasone-Salmeterol (ADVAIR) 250-50 MCG/DOSE AEPB Inhale 1 puff into the lungs 2 (two) times daily.    [name] Kitchen losartan (COZAAR) 50 MG tablet Take 1 tablet (50 mg total) by mouth daily. 90 tablet 3  . pioglitazone (ACTOS) 30 MG tablet Take 30 mg by mouth daily.     No current facility-administered medications on file prior to visit.   Allergies  Allergen Reactions  . Ace Inhibitors    History   Social History  . Marital Status: Married    Spouse Name: [name]    Number of Children: 6  . Years of Education: 11th   Occupational History  . Retired    Social History Main Topics  . Smoking status: Never Smoker   . Smokeless tobacco: Never Used  . Alcohol Use: No  . Drug Use: No  . Sexual Activity: Not on file   Other Topics Concern  . Not on file   Social History Narrative   Patient lives at home with family.   Caffeine Use: 1 cup of coffee   Family History  Problem Relation Age of Onset  . Colon cancer Neg Hx   . Hypertension Mother   . Hypertension Father      Review of Systems  All other systems reviewed and are  negative.      Objective:   Physical Exam  Constitutional: He is oriented to person, place, and time. He appears well-developed and well-nourished. No distress.  HENT:  Head: Normocephalic and atraumatic.  Right Ear: External ear normal.  Left Ear: External ear normal.  Nose: Nose normal.  Mouth/Throat: Oropharynx is clear and moist. No oropharyngeal exudate.  Eyes: EOM are normal. Pupils are equal, round, and reactive to light. Right eye exhibits no discharge. Left eye exhibits no discharge. No scleral icterus.  Neck: Normal range of motion. Neck supple. No JVD present. No tracheal deviation present. No thyromegaly present.  Cardiovascular: Normal rate, regular rhythm, normal heart sounds and  intact distal pulses.  Exam reveals no gallop and no friction rub.   No murmur heard. Pulmonary/Chest: Effort normal. No stridor. No respiratory distress. He has no wheezes. He has no rales. He exhibits no tenderness.  Abdominal: Soft. Bowel sounds are normal. He exhibits no distension and no mass. There is no tenderness. There is no rebound and no guarding.  Genitourinary: Rectum normal and prostate normal.  Musculoskeletal: Normal range of motion. He exhibits no edema or tenderness.  Lymphadenopathy:    He has no cervical adenopathy.  Neurological: He is alert and oriented to person, place, and time. He has normal reflexes. He displays normal reflexes. No cranial nerve deficit. He exhibits normal muscle tone. Coordination normal.  Skin: Skin is warm. No rash noted. He is not diaphoretic. No erythema. No pallor.  Vitals reviewed.  patient has diminished breath sounds in his right lower lung. Patient also appears to have mildly impaired judgment related to his stroke .        Assessment & Plan:  Prostate cancer screening - Plan: PSA, Medicare  Diabetes mellitus type II, uncontrolled - Plan: CBC with Differential, COMPLETE METABOLIC PANEL WITH GFR, Hemoglobin A1c, Lipid panel, Microalbumin, urine  Routine general medical examination at a health care facility   patient's physical exam today is normal.  Diabetic foot exam is performed and is normal. I recommended a diabetic eye exam but the patient refused to allow me to schedule it. Patient received Pneumovax 23 today in clinic. He declined a tetanus vaccine or the shingles vaccine. I'll discuss Prevnar with him next year. I will check a PSA. I will also check a CBC, CMP, hemoglobin A1c, lipid panel, and urine microalbumin. The patient is not regularly checking his blood sugars. His blood pressure is acceptable. His goal LDL cholesterol is less than 70 given his history of stroke. I recommended a colonoscopy but the patient declined that.

## 2014-06-09 LAB — PSA, MEDICARE: PSA: 0.51 ng/mL (ref <=4.00)

## 2014-06-11 NOTE — Telephone Encounter (Signed)
Received form back from Dr. Dennard Schaumann and pt aware forms are ready for pick up via voice mail, forms are placed up front in folder.

## 2014-06-14 ENCOUNTER — Other Ambulatory Visit: Payer: Self-pay | Admitting: Family Medicine

## 2014-06-14 MED ORDER — LINAGLIPTIN 5 MG PO TABS: 5.0000 mg | ORAL_TABLET | Freq: Every day | ORAL | Status: DC

## 2014-06-14 MED ORDER — LOSARTAN POTASSIUM 100 MG PO TABS: 100.0000 mg | ORAL_TABLET | Freq: Every day | ORAL | Status: DC

## 2014-06-15 ENCOUNTER — Telehealth: Payer: Self-pay | Admitting: Family Medicine

## 2014-06-15 NOTE — Telephone Encounter (Signed)
Patients wife is calling to say that the pharmacy did not receive the the request for his tradenta yesterday

## 2014-06-16 NOTE — Telephone Encounter (Signed)
RX ready at pharmacy.

## 2014-07-07 ENCOUNTER — Telehealth: Payer: Self-pay | Admitting: Emergency Medicine

## 2014-07-07 MED ORDER — FLUTICASONE-SALMETEROL 250-50 MCG/DOSE IN AEPB: 1.0000 | INHALATION_SPRAY | Freq: Two times a day (BID) | RESPIRATORY_TRACT | Status: DC

## 2014-07-07 NOTE — Telephone Encounter (Signed)
Samples have been left up front and i have attempted to call the pt to make him aware but number is unable to accept a message at this time.  Will try back tomorrow morning.

## 2014-07-08 NOTE — Telephone Encounter (Signed)
Called, spoke with pt. Advised advair samples at front for pick up.  He verbalized understanding, was very appreciative of this, and voiced no further questions or concerns at this time.

## 2014-08-02 IMAGING — CT CT ABD-PEL WO/W CM
2 of 9 series · 11 of 46 positions shown, 17 images · IV contrast (Omnipaque 300)
Comparison: ERCP 02/24/2013. CT 02/20/2013.

CLINICAL DATA: Postop cholecystectomy. Recent ERCP.

EXAM:
CT ABDOMEN AND PELVIS WITHOUT AND WITH CONTRAST
TECHNIQUE: Multidetector CT imaging of the abdomen and pelvis was performed
without contrast material in one or both body regions, followed by
contrast material(s) and further sections in one or both body
regions.
CONTRAST:  80mL OMNIPAQUE IOHEXOL 300 MG/ML SOLN, 50mL OMNIPAQUE
IOHEXOL 300 MG/ML SOLN

[Series 15: mpr arterial cor (id) · coronal · arterial · 0.57mm/px · 3 of 107 slices shown, 4 images]
[im 27/107  soft-tissue]
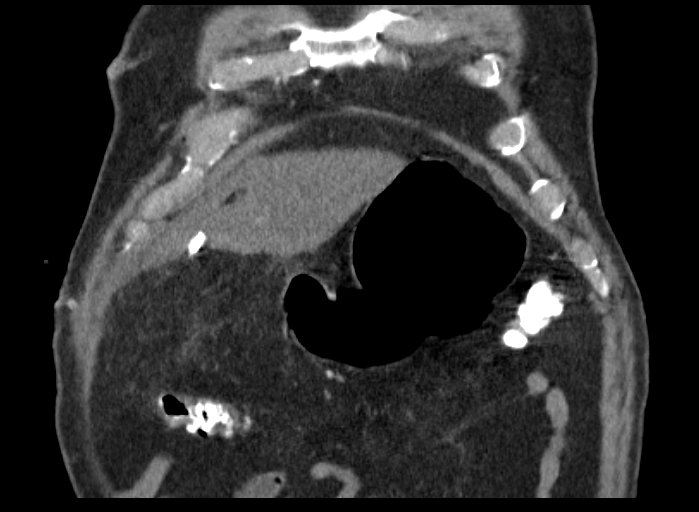
[im 54/107  soft-tissue]
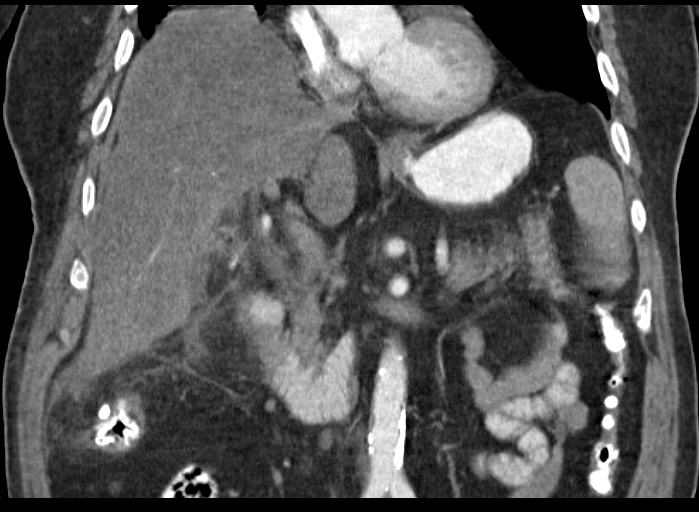
[im 54/107  bone]
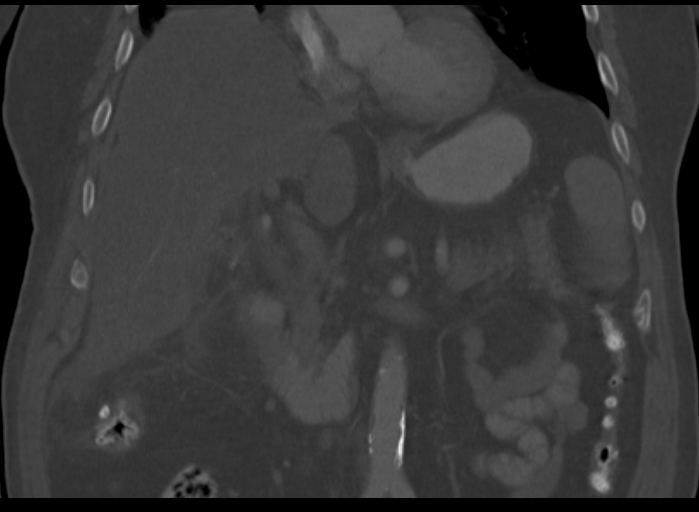
[im 80/107  soft-tissue]
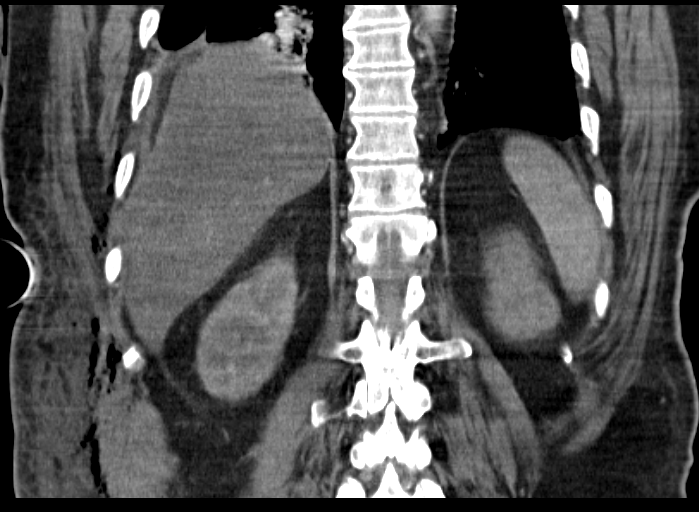

[Series 18: venous 60 sec 3.0 b40f · axial · portal-venous · 0.85mm/px · z∈[+547,+970]mm · 8 of 183 slices shown, 13 images]
[im 21/183  soft-tissue]
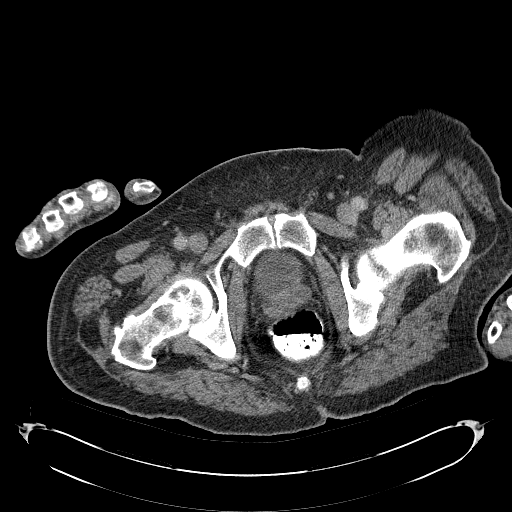
[im 21/183  bone]
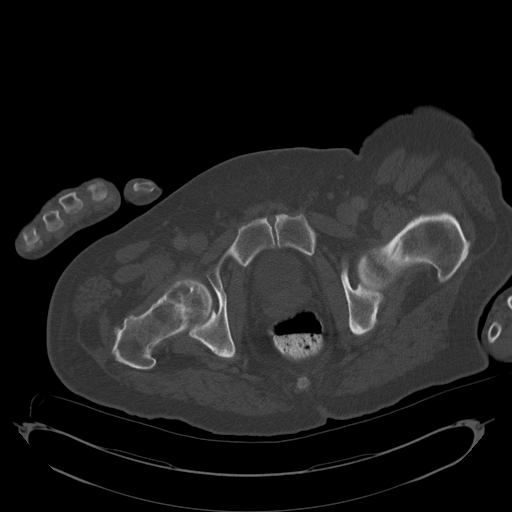
[im 41/183  soft-tissue]
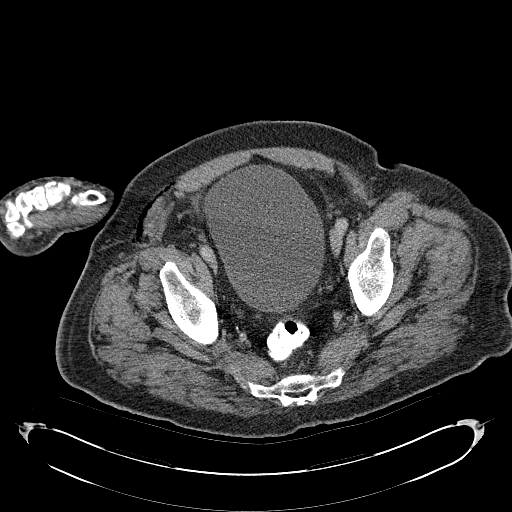
[im 61/183  soft-tissue]
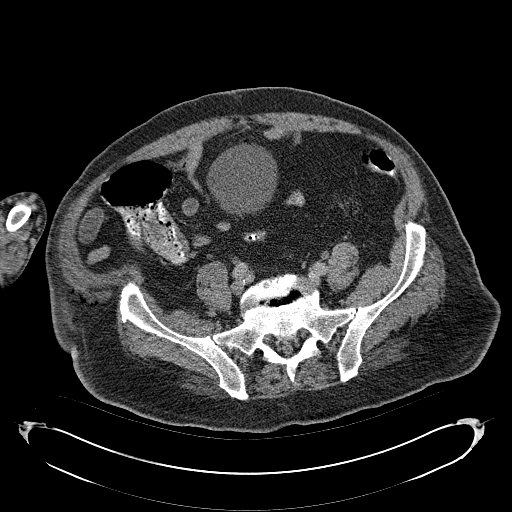
[im 81/183  soft-tissue]
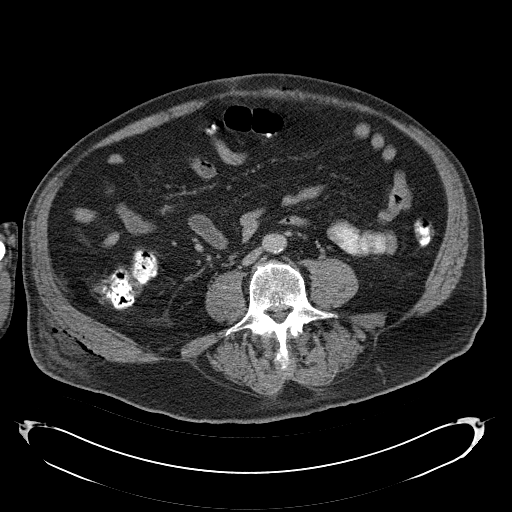
[im 102/183  soft-tissue]
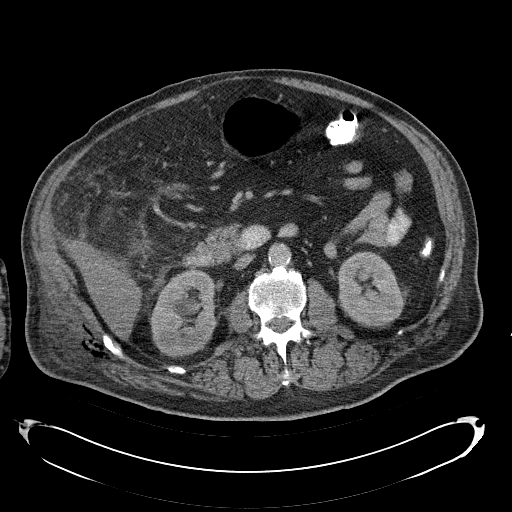
[im 102/183  lung]
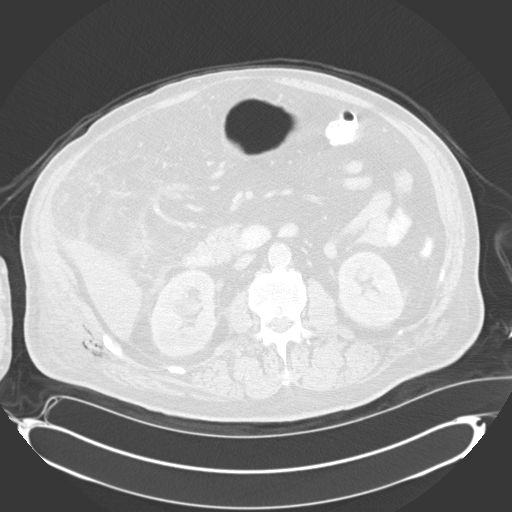
[im 122/183  soft-tissue]
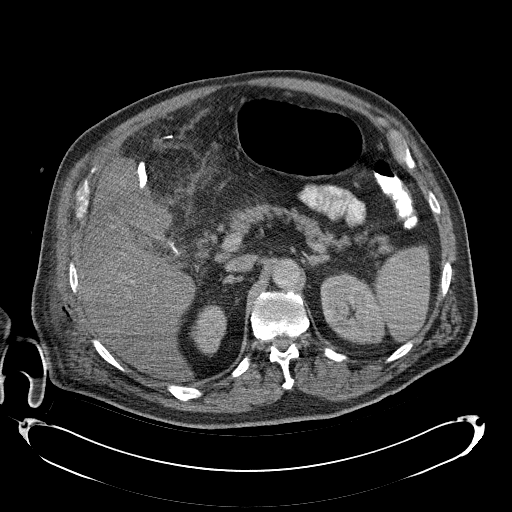
[im 122/183  lung]
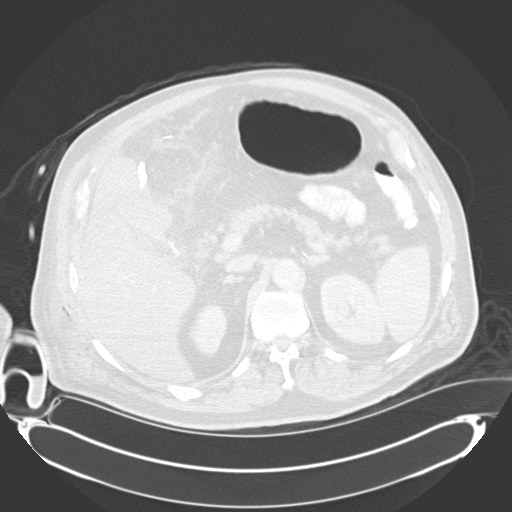
[im 142/183  soft-tissue]
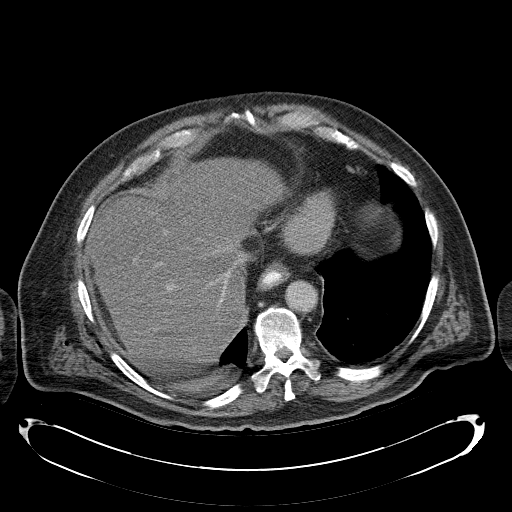
[im 142/183  lung]
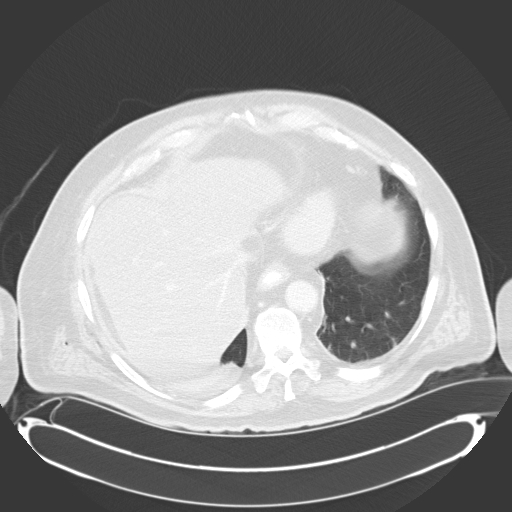
[im 162/183  soft-tissue]
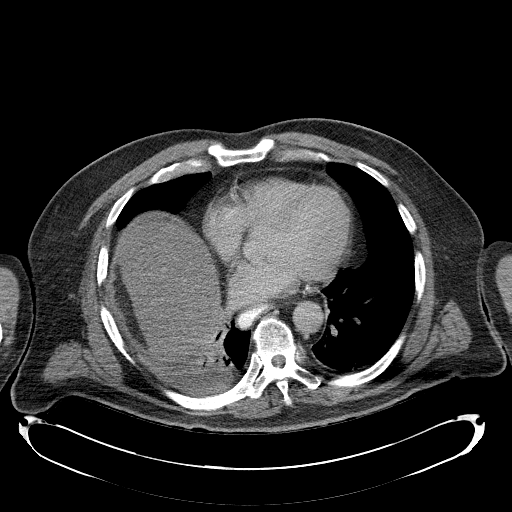
[im 162/183  lung]
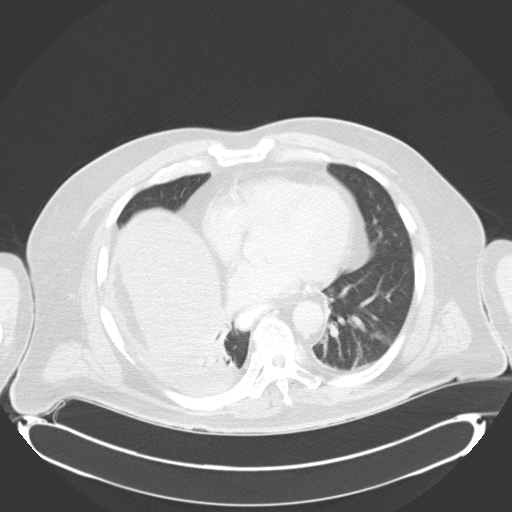

[11 of 46 positions shown; findings below may reference images not displayed]

FINDINGS: Small right pleural effusion. Atelectasis or consolidation in the
right lower lobe. No focal opacity in the left base. Coronary artery
calcifications are noted. Oral contrast material is noted within the
esophagus which could be related to reflux or dysmotility.

Prior cholecystectomy. Surgical drain is present in the right upper
quadrant period postsurgical stranding and a few locules of gas in
the right upper quadrant. Pockets of gas within the right lateral
abdominal wall. These findings are presumably due to recent surgery.
Diffuse fatty infiltration of the liver. No biliary ductal
dilatation. Spleen, pancreas, adrenals and stomach are unremarkable.
2.2 cm cyst in the midpole of the right kidney. Kidneys otherwise
unremarkable. No hydronephrosis.

Diffuse colonic diverticulosis. No changes of active diverticulitis.
Small bowel is decompressed. Aorta and branch vessels are calcified,
non aneurysmal. Urinary bladder is unremarkable. No free fluid, free
air or adenopathy. No acute bony abnormality.
IMPRESSION: Postoperative changes from prior cholecystectomy. Postsurgical
changes in the right upper quadrant with postsurgical drain in
place.

Fatty infiltration of the liver.

Colonic diverticulosis.

Small right pleural effusion.

## 2014-08-02 IMAGING — CT CT HEAD WO/W CM
1 of 2 series · 16 of 30 positions shown, 20 images · IV contrast (omnipaque)
Comparison: 11/30/2010

CLINICAL DATA: Mental status changes.

EXAM:
CT HEAD WITHOUT AND WITH CONTRAST
TECHNIQUE: Contiguous axial images were obtained from the base of the skull
through the vertex without and with intravenous contrast
CONTRAST:  80 cc Omnipaque 300 IV.

[Series 3: headtrauma 2.4 h60s · axial · 0.46mm/px · z∈[+1281,+1441]mm · 16 of 72 slices shown, 20 images]
[im 4/72  brain]
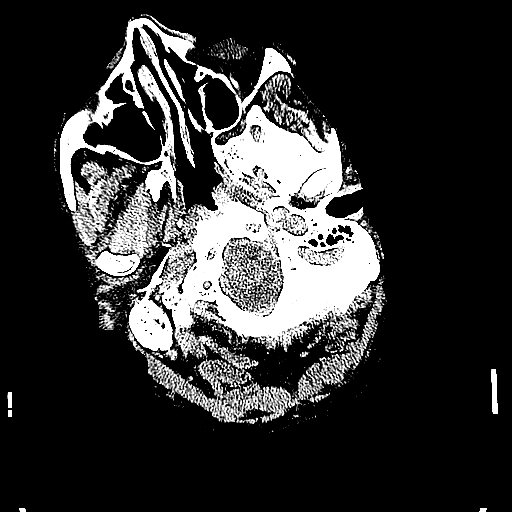
[im 4/72  bone]
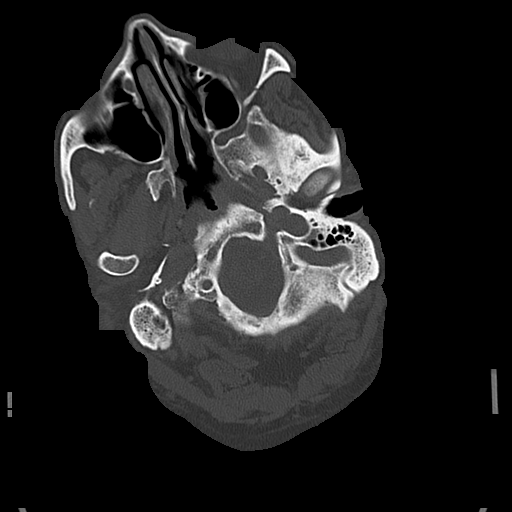
[im 8/72  brain]
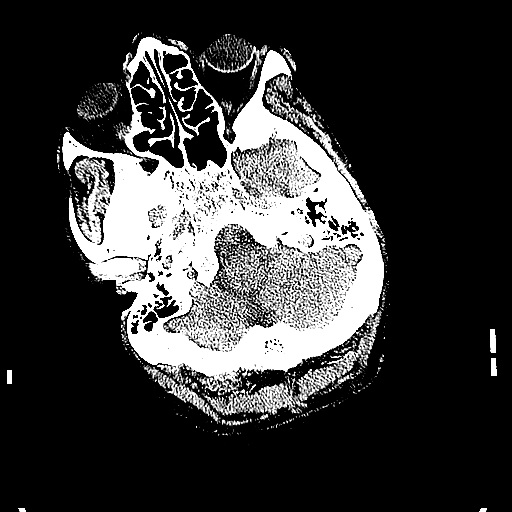
[im 12/72  brain]
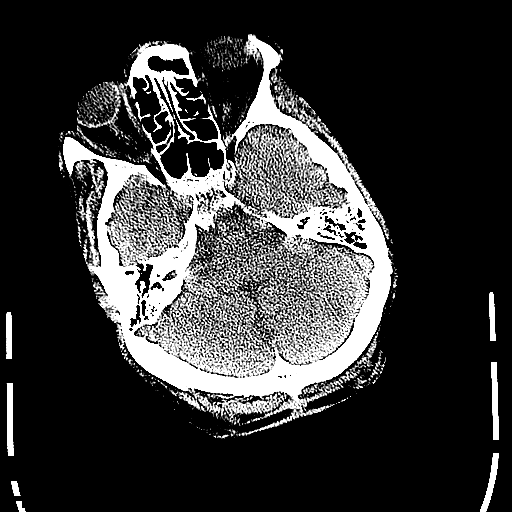
[im 15/72  brain]
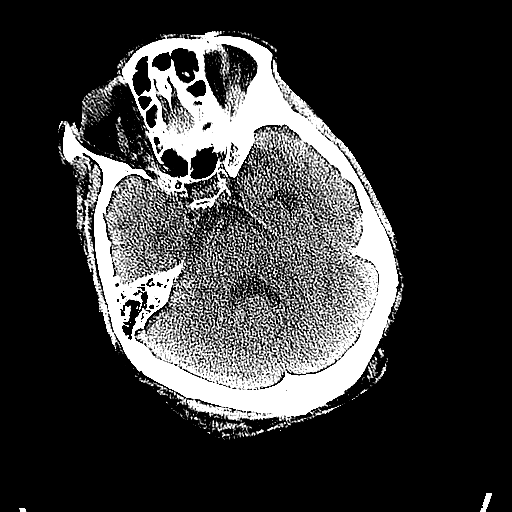
[im 23/72  brain]
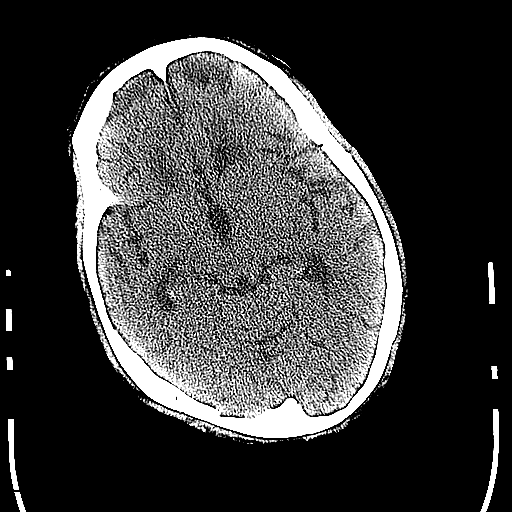
[im 23/72  bone]
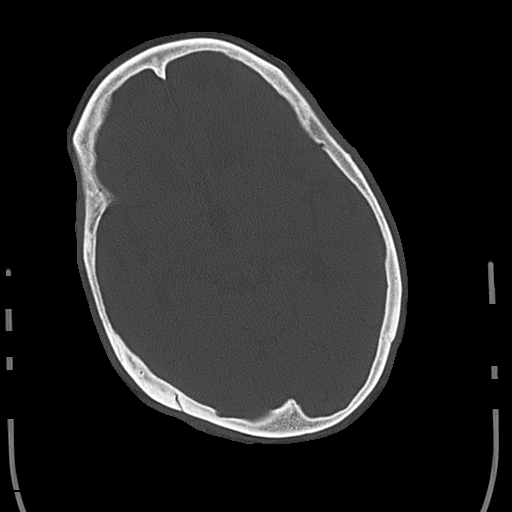
[im 27/72  brain]
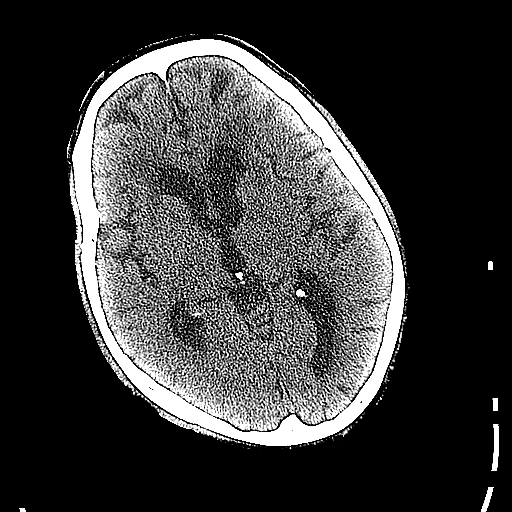
[im 30/72  brain]
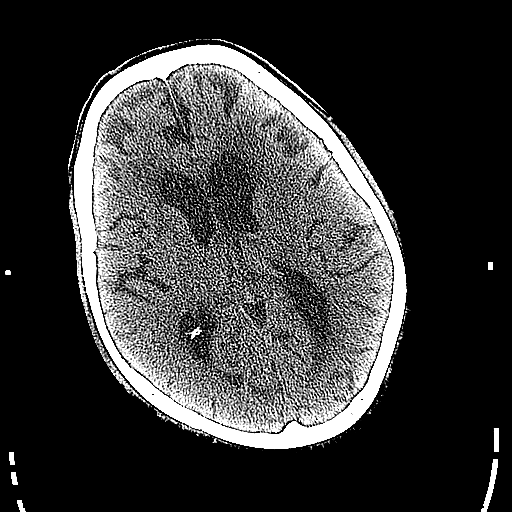
[im 34/72  brain]
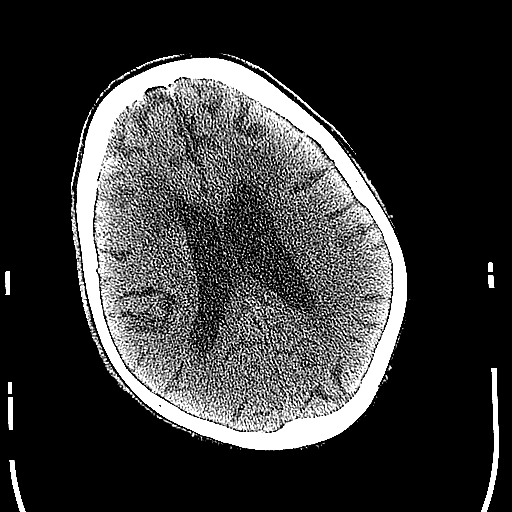
[im 38/72  brain]
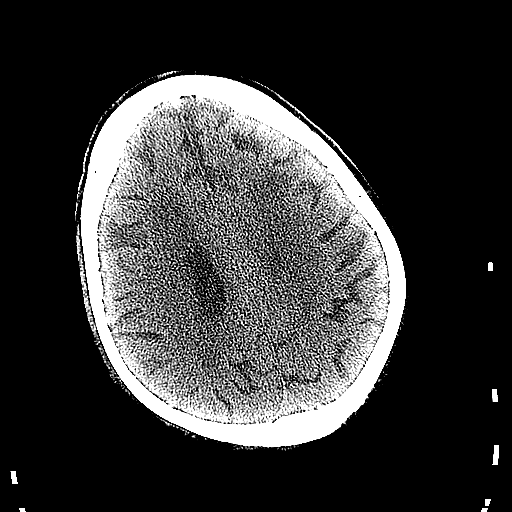
[im 38/72  bone]
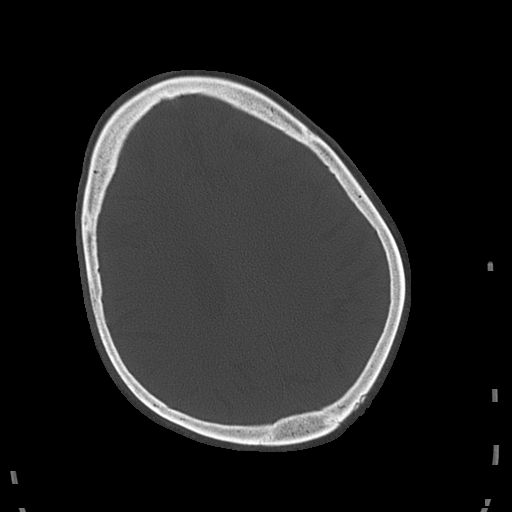
[im 42/72  brain]
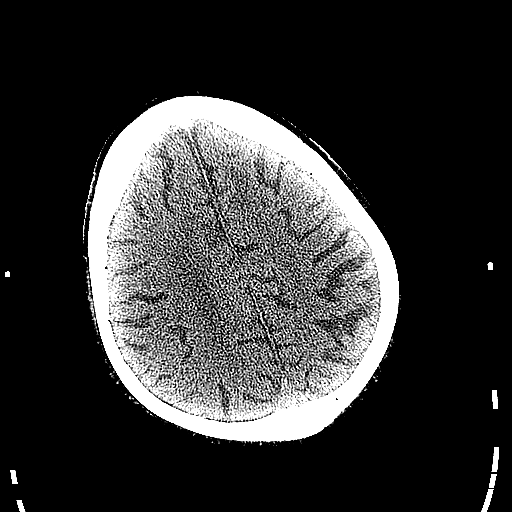
[im 45/72  brain]
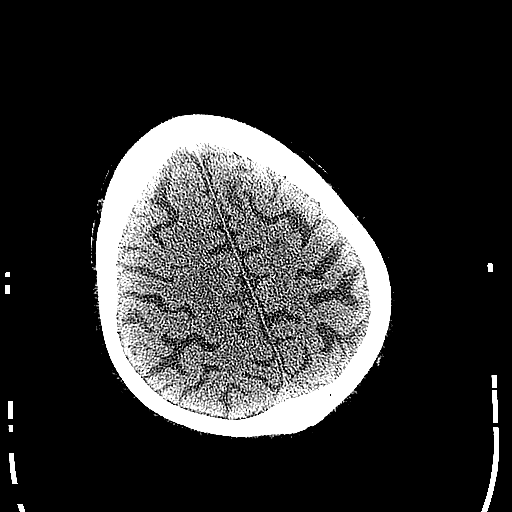
[im 49/72  brain]
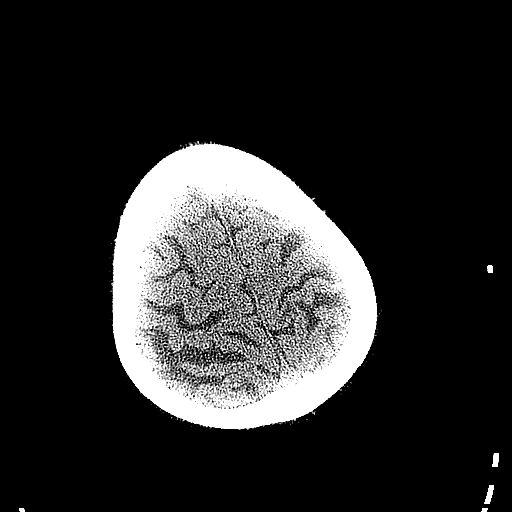
[im 57/72  brain]
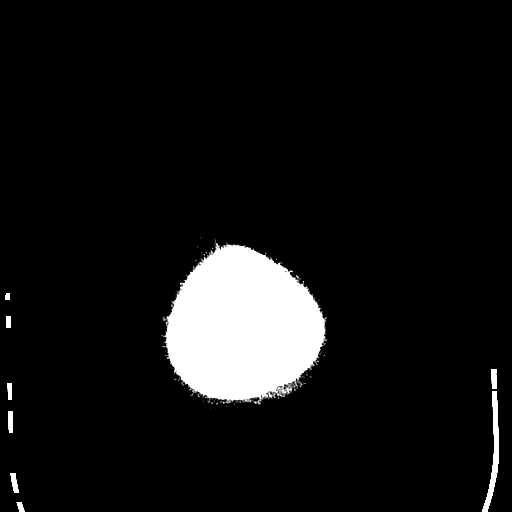
[im 57/72  bone]
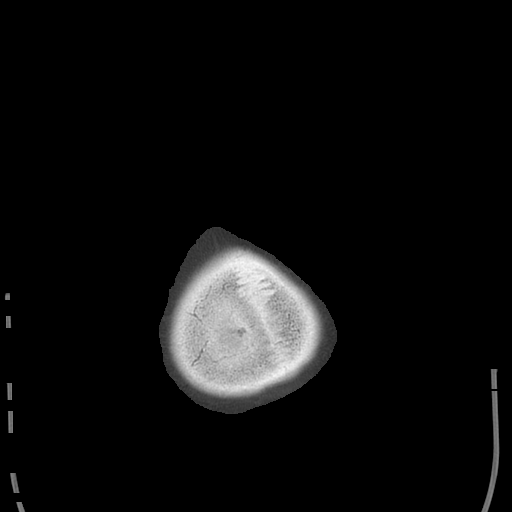
[im 60/72  brain]
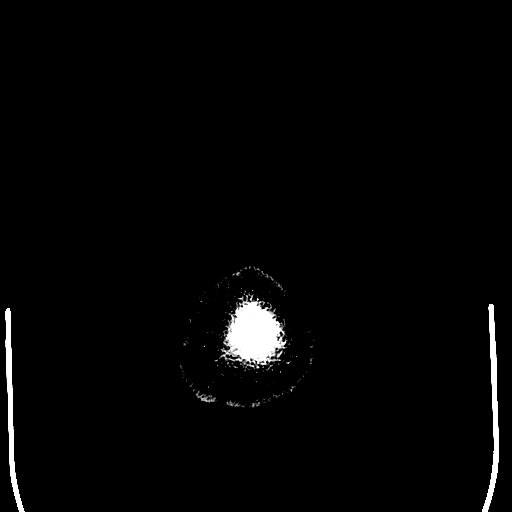
[im 64/72  brain]
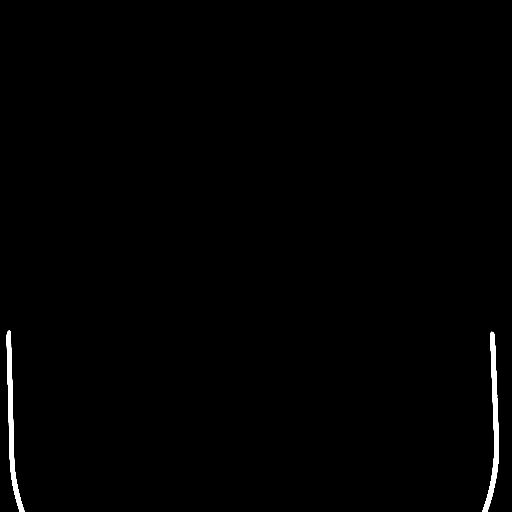
[im 68/72  brain]
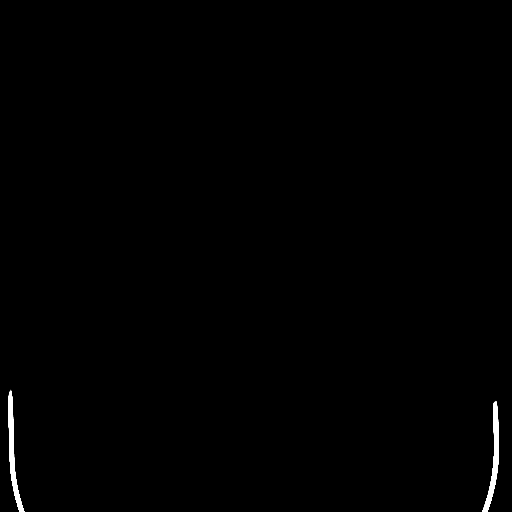

[16 of 30 positions shown; findings below may reference images not displayed]

FINDINGS: Bold anterior left frontal infarct. Chronic microvascular changes
throughout the deep white matter. No hemorrhage, hydrocephalus or
acute infarction. No enhancing lesions following contrast
administration. No acute calvarial abnormality. Visualized paranasal
sinuses and mastoids clear. Orbital soft tissues unremarkable.
IMPRESSION: Atrophy, chronic microvascular changes.

Old anterior left frontal infarct.

No acute findings.

## 2014-08-06 ENCOUNTER — Other Ambulatory Visit: Payer: Self-pay | Admitting: Family Medicine

## 2014-09-02 ENCOUNTER — Ambulatory Visit: Payer: Self-pay | Admitting: Family Medicine

## 2014-09-13 ENCOUNTER — Encounter: Payer: Self-pay | Admitting: Gastroenterology

## 2014-09-15 ENCOUNTER — Encounter: Payer: Self-pay | Admitting: Gastroenterology

## 2014-09-21 ENCOUNTER — Ambulatory Visit (INDEPENDENT_AMBULATORY_CARE_PROVIDER_SITE_OTHER): Payer: Medicare Other | Admitting: Family Medicine

## 2014-09-21 ENCOUNTER — Encounter: Payer: Self-pay | Admitting: Family Medicine

## 2014-09-21 VITALS — BP 98/64 | HR 72 | Temp 98.1°F | Resp 18 | Ht 70.0 in | Wt 223.0 lb

## 2014-09-21 DIAGNOSIS — I10 Essential (primary) hypertension: Secondary | ICD-10-CM

## 2014-09-21 DIAGNOSIS — E1165 Type 2 diabetes mellitus with hyperglycemia: Secondary | ICD-10-CM | POA: Diagnosis not present

## 2014-09-21 DIAGNOSIS — Z125 Encounter for screening for malignant neoplasm of prostate: Secondary | ICD-10-CM

## 2014-09-21 DIAGNOSIS — IMO0002 Reserved for concepts with insufficient information to code with codable children: Secondary | ICD-10-CM

## 2014-09-21 LAB — COMPLETE METABOLIC PANEL WITH GFR
ALT: <8 U/L (ref 0–53)
AST: 11 U/L (ref 0–37)
Albumin: 3.9 g/dL (ref 3.5–5.2)
Alkaline Phosphatase: 86 U/L (ref 39–117)
BILIRUBIN TOTAL: 0.5 mg/dL (ref 0.2–1.2)
BUN: 10 mg/dL (ref 6–23)
CO2: 24 mEq/L (ref 19–32)
Calcium: 8.9 mg/dL (ref 8.4–10.5)
Chloride: 104 mEq/L (ref 96–112)
Creat: 1.42 mg/dL — ABNORMAL HIGH (ref 0.50–1.35)
GFR, EST AFRICAN AMERICAN: 55 mL/min — AB
GFR, EST NON AFRICAN AMERICAN: 48 mL/min — AB
GLUCOSE: 142 mg/dL — AB (ref 70–99)
POTASSIUM: 4 meq/L (ref 3.5–5.3)
SODIUM: 138 meq/L (ref 135–145)
Total Protein: 7.5 g/dL (ref 6.0–8.3)

## 2014-09-21 LAB — LIPID PANEL
Cholesterol: 126 mg/dL (ref 0–200)
HDL: 30 mg/dL — AB (ref >=40)
LDL Cholesterol: 75 mg/dL (ref 0–99)
Total CHOL/HDL Ratio: 4.2 Ratio
Triglycerides: 106 mg/dL (ref <150)
VLDL: 21 mg/dL (ref 0–40)

## 2014-09-21 LAB — HEMOGLOBIN A1C
HEMOGLOBIN A1C: 7.2 % — AB (ref <5.7)
MEAN PLASMA GLUCOSE: 160 mg/dL — AB (ref <117)

## 2014-09-21 NOTE — Progress Notes (Signed)
Subjective:    Patient ID: [name], male    DOB: 04-03-1936, 79 y.o.   MRN: [id number]  HPI  Patient is a 79 year old African-American male who is here today for his regular checkup. He denies any complaints. He has a history of diabetes mellitus type 2. The patient seldom checks his blood sugar. He is overdue for diabetic eye exam as well as a diabetic foot exam. He denies any neuropathy in his feet. He denies any polyuria, polydipsia, or blurred vision. He does frequently eat sweets and candies and cakes. He does not comply with a low carbohydrate diet. He denies any symptoms of hypoglycemia. His blood pressure today is well controlled. If anything it is actually low. Patient is currently on an angiotensin receptor blocker for renal protection. He denies any symptoms of dizziness or dehydration. We may need to decrease the dose of his angiotensin receptor blocker due to hypotension if his microalbumin is normal. He is due for fasting lipid panel. Goal LDL cholesterol is less than 70 given his history of stroke. Otherwise he is doing well and denies any complaints. Past Medical History  Diagnosis Date  . COPD (chronic obstructive pulmonary disease)   . Hx-TIA (transient ischemic attack)   . Diabetes mellitus   . Hypertension   . Hyperlipidemia   . Anxiety   . CVA (cerebral vascular accident)    Past Surgical History  Procedure Laterality Date  . Rotator cuff repair    . Cholecystectomy N/A 02/23/2013    Procedure: LAPAROSCOPIC CHOLECYSTECTOMY;  Surgeon: Jamesetta So, MD;  Location: [facility];  Service: General;  Laterality: N/A;  . Ercp N/A 02/24/2013    Procedure: ENDOSCOPIC RETROGRADE CHOLANGIOPANCREATOGRAPHY;  Surgeon: Daneil Dolin, MD;  Location: [facility];  Service: Endoscopy;  Laterality: N/A;   Current Outpatient Prescriptions on File Prior to Visit  Medication Sig Dispense Refill  . ADVAIR DISKUS 250-50 MCG/DOSE AEPB INHALE 1 PUFF INTO THE LUNGS EVERY 12 (TWELVE) HOURS. 60  each 5  . albuterol (PROVENTIL) (2.5 MG/3ML) 0.083% nebulizer solution Take 3 mLs (2.5 mg total) by nebulization every 4 (four) hours as needed for wheezing or shortness of breath. 30 vial 0  . aspirin EC 81 MG tablet Take 81 mg by mouth daily.    [name] Kitchen linagliptin (TRADJENTA) 5 MG TABS tablet Take 1 tablet (5 mg total) by mouth daily. 30 tablet 3  . losartan (COZAAR) 100 MG tablet Take 1 tablet (100 mg total) by mouth daily. 30 tablet 3  . pioglitazone (ACTOS) 30 MG tablet Take 30 mg by mouth daily.     No current facility-administered medications on file prior to visit.   Allergies  Allergen Reactions  . Ace Inhibitors    History   Social History  . Marital Status: Married    Spouse Name: [name]  . Number of Children: 6  . Years of Education: 11th   Occupational History  . Retired    Social History Main Topics  . Smoking status: Never Smoker   . Smokeless tobacco: Never Used  . Alcohol Use: No  . Drug Use: No  . Sexual Activity: Not on file   Other Topics Concern  . Not on file   Social History Narrative   Patient lives at home with family.   Caffeine Use: 1 cup of coffee     Review of Systems  All other systems reviewed and are negative.      Objective:   Physical Exam  Constitutional: He  is oriented to person, place, and time. He appears well-developed and well-nourished. No distress.  Neck: Neck supple. No JVD present. No thyromegaly present.  Cardiovascular: Normal rate, regular rhythm and normal heart sounds.  Exam reveals no gallop and no friction rub.   No murmur heard. Pulmonary/Chest: Effort normal and breath sounds normal. No respiratory distress. He has no wheezes. He has no rales. He exhibits no tenderness.  Abdominal: Soft. Bowel sounds are normal. He exhibits no distension and no mass. There is no tenderness. There is no rebound and no guarding.  Musculoskeletal: He exhibits no edema.  Lymphadenopathy:    He has no cervical adenopathy.  Neurological:  He is alert and oriented to person, place, and time. He has normal reflexes.  Skin: He is not diaphoretic.  Vitals reviewed.         Assessment & Plan:

## 2014-09-22 LAB — PSA, MEDICARE: PSA: 0.46 ng/mL (ref <=4.00)

## 2014-09-27 ENCOUNTER — Other Ambulatory Visit: Payer: Medicare Other

## 2014-09-27 LAB — MICROALBUMIN, URINE: Microalb, Ur: 4 mg/dL — ABNORMAL HIGH (ref <2.0)

## 2014-10-07 ENCOUNTER — Ambulatory Visit (INDEPENDENT_AMBULATORY_CARE_PROVIDER_SITE_OTHER): Payer: Medicare Other | Admitting: Family Medicine

## 2014-10-07 ENCOUNTER — Encounter: Payer: Self-pay | Admitting: Family Medicine

## 2014-10-07 VITALS — BP 140/70 | HR 80 | Temp 97.9°F | Resp 16 | Ht 70.0 in | Wt 221.0 lb

## 2014-10-07 DIAGNOSIS — F0390 Unspecified dementia without behavioral disturbance: Secondary | ICD-10-CM

## 2014-10-07 MED ORDER — DONEPEZIL HCL 5 MG PO TABS: 5.0000 mg | ORAL_TABLET | Freq: Every day | ORAL | Status: DC

## 2014-10-07 NOTE — Progress Notes (Signed)
Subjective:    Patient ID: [name], male    DOB: 1936-01-01, 79 y.o.   MRN: [id number]  HPI Please see the MRI of the patient's brain performed in 2014. At that time he had medium and small vessel cerebrovascular disease and ischemic changes seen in the brain consistent with that. Since that time his memory has steadily worsened. He is accompanied today by his wife. She states that the patient frequently forgets people. He frequently forgets conversations that they've had. He will frequently forget to do think she is asking to do. She states that at times he can become confused. This has become progressively worse since his stroke 2 years ago. He also gets disoriented from time to time. Today on Mini-Mental status exam he is able to recall the location. He gets confused on the year but quickly corrects himself. He is able to perform rapid recall of 3 objects without difficulty. However the patient struggles to perform serial sevens and frequently becomes confused. When asked the patient to draw all a clock, he places the numbers outside of the clock rather than on the face. He is also unable to draw the correct time. When asked the patient to write a sentence, he writes descendents vertically on the piece of paper rather than left to right. Mini-Mental status exam therefore is 25 out of 30 as he only gets 1 point for serial sevens and he loses 1 point for incorrectly writing a sentence.   Past Medical History  Diagnosis Date  . COPD (chronic obstructive pulmonary disease)   . Hx-TIA (transient ischemic attack)   . Diabetes mellitus   . Hypertension   . Hyperlipidemia   . Anxiety   . CVA (cerebral vascular accident)   . Dementia    Past Surgical History  Procedure Laterality Date  . Rotator cuff repair    . Cholecystectomy N/A 02/23/2013    Procedure: LAPAROSCOPIC CHOLECYSTECTOMY;  Surgeon: Jamesetta So, MD;  Location: [facility];  Service: General;  Laterality: N/A;  . Ercp N/A 02/24/2013      Procedure: ENDOSCOPIC RETROGRADE CHOLANGIOPANCREATOGRAPHY;  Surgeon: Daneil Dolin, MD;  Location: [facility];  Service: Endoscopy;  Laterality: N/A;   Current Outpatient Prescriptions on File Prior to Visit  Medication Sig Dispense Refill  . ADVAIR DISKUS 250-50 MCG/DOSE AEPB INHALE 1 PUFF INTO THE LUNGS EVERY 12 (TWELVE) HOURS. 60 each 5  . albuterol (PROVENTIL) (2.5 MG/3ML) 0.083% nebulizer solution Take 3 mLs (2.5 mg total) by nebulization every 4 (four) hours as needed for wheezing or shortness of breath. 30 vial 0  . aspirin EC 81 MG tablet Take 81 mg by mouth daily.    [name] Kitchen linagliptin (TRADJENTA) 5 MG TABS tablet Take 1 tablet (5 mg total) by mouth daily. 30 tablet 3  . losartan (COZAAR) 100 MG tablet Take 1 tablet (100 mg total) by mouth daily. 30 tablet 3  . pioglitazone (ACTOS) 30 MG tablet Take 30 mg by mouth daily.     No current facility-administered medications on file prior to visit.   Allergies  Allergen Reactions  . Ace Inhibitors    History   Social History  . Marital Status: Married    Spouse Name: [name]  . Number of Children: 6  . Years of Education: 11th   Occupational History  . Retired    Social History Main Topics  . Smoking status: Never Smoker   . Smokeless tobacco: Never Used  . Alcohol Use: No  . Drug  Use: No  . Sexual Activity: Not on file   Other Topics Concern  . Not on file   Social History Narrative   Patient lives at home with family.   Caffeine Use: 1 cup of coffee      Review of Systems  All other systems reviewed and are negative.      Objective:   Physical Exam  Constitutional: He is oriented to person, place, and time.  Cardiovascular: Normal rate, regular rhythm and normal heart sounds.   Pulmonary/Chest: Effort normal and breath sounds normal. No respiratory distress. He has no wheezes. He has no rales.  Neurological: He is alert and oriented to person, place, and time. He has normal reflexes. He displays normal reflexes.  No cranial nerve deficit. He exhibits normal muscle tone. Coordination normal.  Vitals reviewed.         Assessment & Plan:  Dementia, without behavioral disturbance - Plan: donepezil ([name]) 5 MG tablet  I believe the patient has vascular dementia. At the present time it is very subtle. I would like to start the patient on [name] 5 mg by mouth daily and recheck the patient in 3 months. We also need to control his risk factors is much as possible including his blood pressure, his diabetes, and his cholesterol.

## 2014-10-11 ENCOUNTER — Telehealth: Payer: Self-pay | Admitting: Emergency Medicine

## 2014-10-11 MED ORDER — FLUTICASONE-SALMETEROL 250-50 MCG/DOSE IN AEPB: 1.0000 | INHALATION_SPRAY | Freq: Two times a day (BID) | RESPIRATORY_TRACT | Status: DC

## 2014-10-11 NOTE — Telephone Encounter (Signed)
Spoke with pt's spouse, aware that advair samples will be placed up front for pickup.  Nothing further needed.

## 2014-10-15 ENCOUNTER — Telehealth: Payer: Self-pay | Admitting: Family Medicine

## 2014-10-15 NOTE — Telephone Encounter (Signed)
I believe it may be his losartan and I spoke to wife and told her to call the pharmacy and tell them that he lost it and see if they can override it this time to get a refill early. She verbalized understanding.

## 2014-10-15 NOTE — Telephone Encounter (Signed)
[phone]  PT wife is calling because the pt has lost one of his bottles of medication for his kidneys, she states that they have the acteos but it is the other medication that they have lost and they do not know the name of it [facility] [place]

## 2014-10-26 ENCOUNTER — Telehealth: Payer: Self-pay | Admitting: *Deleted

## 2014-10-26 NOTE — Telephone Encounter (Signed)
Pt has appointment scheduled for May 18 at 10am with [facility] with Dr. Iona Hansen, no answer at this time and no vm set up. Will call pt later time.

## 2014-11-03 NOTE — Telephone Encounter (Signed)
Called pt and spoke to wife linda and she is aware of appt

## 2014-11-08 ENCOUNTER — Telehealth: Payer: Self-pay | Admitting: Family Medicine

## 2014-11-08 MED ORDER — ALBUTEROL SULFATE (2.5 MG/3ML) 0.083% IN NEBU: 2.5000 mg | INHALATION_SOLUTION | RESPIRATORY_TRACT | Status: DC | PRN

## 2014-11-08 NOTE — Telephone Encounter (Signed)
Patients wife calling to get refill on his albuterol of possible  [employer]

## 2014-11-08 NOTE — Telephone Encounter (Signed)
Medication called/sent to requested pharmacy  

## 2015-01-19 ENCOUNTER — Telehealth: Payer: Self-pay | Admitting: Emergency Medicine

## 2015-01-19 DIAGNOSIS — J9811 Atelectasis: Secondary | ICD-10-CM

## 2015-01-19 DIAGNOSIS — R069 Unspecified abnormalities of breathing: Secondary | ICD-10-CM | POA: Diagnosis not present

## 2015-01-19 NOTE — Telephone Encounter (Signed)
Called and spoke to pt's wife. Pt requesting Advair samples. Samples left up front for pick up. CT scan ordered to be done in Sept. Recall placed for OV after CT. Pt's wife verbalized understanding and denied any further questions or concerns at this time.

## 2015-01-19 NOTE — Telephone Encounter (Addendum)
LMTCB x 1 Requests sample of Advair 250  Pt needs to be scheduled for OV with RB in Sept 2016 with repeat CT per last OV Patient Instructions 04/21/14    Your PET scan does not show any evidence of inflammation or cancer in your right lung lesion - this is most consistent with lung scarring.  We will repeat your CT scan of the chest in September 2016.  Follow with Dr Lamonte Sakai in September 2016 to review that CT scan. Call sooner if you develop any breathing or chest symptoms    If patient is okay with this, order will need to be placed for repeat CT.

## 2015-01-19 NOTE — Telephone Encounter (Signed)
Pt's wife returned call - [phone]

## 2015-01-25 DIAGNOSIS — H524 Presbyopia: Secondary | ICD-10-CM | POA: Diagnosis not present

## 2015-01-25 DIAGNOSIS — H5213 Myopia, bilateral: Secondary | ICD-10-CM | POA: Diagnosis not present

## 2015-01-25 DIAGNOSIS — E119 Type 2 diabetes mellitus without complications: Secondary | ICD-10-CM | POA: Diagnosis not present

## 2015-01-25 DIAGNOSIS — H52223 Regular astigmatism, bilateral: Secondary | ICD-10-CM | POA: Diagnosis not present

## 2015-01-27 ENCOUNTER — Ambulatory Visit: Payer: Medicare Other | Admitting: Family Medicine

## 2015-02-03 ENCOUNTER — Ambulatory Visit (INDEPENDENT_AMBULATORY_CARE_PROVIDER_SITE_OTHER): Payer: Medicare Other | Admitting: Family Medicine

## 2015-02-03 ENCOUNTER — Encounter: Payer: Self-pay | Admitting: Family Medicine

## 2015-02-03 ENCOUNTER — Encounter (HOSPITAL_COMMUNITY): Payer: Self-pay | Admitting: Emergency Medicine

## 2015-02-03 ENCOUNTER — Emergency Department (HOSPITAL_COMMUNITY)
Admission: EM | Admit: 2015-02-03 | Discharge: 2015-02-03 | Disposition: A | Discharge status: Home / Self Care | Payer: Medicare Other | Attending: Emergency Medicine | Admitting: Emergency Medicine

## 2015-02-03 ENCOUNTER — Other Ambulatory Visit: Payer: Self-pay

## 2015-02-03 ENCOUNTER — Emergency Department (HOSPITAL_COMMUNITY): Payer: Medicare Other

## 2015-02-03 VITALS — BP 132/68 | HR 80 | Temp 98.0°F | Resp 20 | Ht 70.0 in | Wt 215.0 lb

## 2015-02-03 DIAGNOSIS — E119 Type 2 diabetes mellitus without complications: Secondary | ICD-10-CM | POA: Insufficient documentation

## 2015-02-03 DIAGNOSIS — I10 Essential (primary) hypertension: Secondary | ICD-10-CM | POA: Diagnosis not present

## 2015-02-03 DIAGNOSIS — E1165 Type 2 diabetes mellitus with hyperglycemia: Secondary | ICD-10-CM | POA: Diagnosis not present

## 2015-02-03 DIAGNOSIS — Z8673 Personal history of transient ischemic attack (TIA), and cerebral infarction without residual deficits: Secondary | ICD-10-CM | POA: Insufficient documentation

## 2015-02-03 DIAGNOSIS — IMO0002 Reserved for concepts with insufficient information to code with codable children: Secondary | ICD-10-CM

## 2015-02-03 DIAGNOSIS — Z7982 Long term (current) use of aspirin: Secondary | ICD-10-CM | POA: Diagnosis not present

## 2015-02-03 DIAGNOSIS — Z7951 Long term (current) use of inhaled steroids: Secondary | ICD-10-CM | POA: Insufficient documentation

## 2015-02-03 DIAGNOSIS — Z79899 Other long term (current) drug therapy: Secondary | ICD-10-CM | POA: Insufficient documentation

## 2015-02-03 DIAGNOSIS — J441 Chronic obstructive pulmonary disease with (acute) exacerbation: Secondary | ICD-10-CM | POA: Diagnosis not present

## 2015-02-03 DIAGNOSIS — R0602 Shortness of breath: Secondary | ICD-10-CM | POA: Diagnosis not present

## 2015-02-03 DIAGNOSIS — F0390 Unspecified dementia without behavioral disturbance: Secondary | ICD-10-CM | POA: Insufficient documentation

## 2015-02-03 LAB — CBC WITH DIFFERENTIAL/PLATELET
BASOS ABS: 0.1 10*3/uL (ref 0.0–0.1)
Basophils Relative: 1 % (ref 0–1)
Eosinophils Absolute: 0.5 10*3/uL (ref 0.0–0.7)
Eosinophils Relative: 6 % — ABNORMAL HIGH (ref 0–5)
HCT: 38.2 % — ABNORMAL LOW (ref 39.0–52.0)
Hemoglobin: 13 g/dL (ref 13.0–17.0)
Lymphocytes Relative: 22 % (ref 12–46)
Lymphs Abs: 1.7 10*3/uL (ref 0.7–4.0)
MCH: 29.3 pg (ref 26.0–34.0)
MCHC: 34 g/dL (ref 30.0–36.0)
MCV: 86.2 fL (ref 78.0–100.0)
MPV: 8.6 fL (ref 8.6–12.4)
Monocytes Absolute: 0.5 10*3/uL (ref 0.1–1.0)
Monocytes Relative: 7 % (ref 3–12)
Neutro Abs: 4.8 10*3/uL (ref 1.7–7.7)
Neutrophils Relative %: 64 % (ref 43–77)
Platelets: 207 10*3/uL (ref 150–400)
RBC: 4.43 MIL/uL (ref 4.22–5.81)
RDW: 14.7 % (ref 11.5–15.5)
WBC: 7.5 10*3/uL (ref 4.0–10.5)

## 2015-02-03 LAB — COMPLETE METABOLIC PANEL WITH GFR
ALK PHOS: 82 U/L (ref 40–115)
ALT: 9 U/L (ref 9–46)
AST: 12 U/L (ref 10–35)
Albumin: 4.2 g/dL (ref 3.6–5.1)
BUN: 16 mg/dL (ref 7–25)
CALCIUM: 9 mg/dL (ref 8.6–10.3)
CHLORIDE: 104 meq/L (ref 98–110)
CO2: 24 meq/L (ref 20–31)
CREATININE: 1.34 mg/dL — AB (ref 0.70–1.18)
GFR, Est African American: 59 mL/min — ABNORMAL LOW (ref >=60)
GFR, Est Non African American: 51 mL/min — ABNORMAL LOW (ref >=60)
GLUCOSE: 173 mg/dL — AB (ref 70–99)
Potassium: 3.9 mEq/L (ref 3.5–5.3)
Sodium: 138 mEq/L (ref 135–146)
TOTAL PROTEIN: 7.6 g/dL (ref 6.1–8.1)
Total Bilirubin: 0.3 mg/dL (ref 0.2–1.2)

## 2015-02-03 MED ORDER — PREDNISONE 20 MG PO TABS: 40.0000 mg | ORAL_TABLET | Freq: Every day | ORAL | Status: DC

## 2015-02-03 MED ORDER — LOSARTAN POTASSIUM 100 MG PO TABS: 100.0000 mg | ORAL_TABLET | Freq: Every day | ORAL | Status: DC

## 2015-02-03 MED ORDER — METHYLPREDNISOLONE SODIUM SUCC 125 MG IJ SOLR
125.0000 mg | Freq: Once | INTRAMUSCULAR | Status: AC
  Administered 2015-02-03: 125 mg via INTRAVENOUS
  Filled 2015-02-03: qty 2

## 2015-02-03 MED ORDER — ALBUTEROL SULFATE (2.5 MG/3ML) 0.083% IN NEBU: 2.5000 mg | INHALATION_SOLUTION | Freq: Four times a day (QID) | RESPIRATORY_TRACT | Status: DC | PRN

## 2015-02-03 MED ORDER — ALBUTEROL SULFATE (2.5 MG/3ML) 0.083% IN NEBU
5.0000 mg | INHALATION_SOLUTION | Freq: Once | RESPIRATORY_TRACT | Status: AC
  Administered 2015-02-03: 5 mg via RESPIRATORY_TRACT
  Filled 2015-02-03: qty 6

## 2015-02-03 NOTE — ED Provider Notes (Signed)
CSN: [id number]     Arrival date & time 02/03/15  1945 History   First MD Initiated Contact with Patient 02/03/15 2013     Chief Complaint  Patient presents with  . Shortness of Breath     (Consider location/radiation/quality/duration/timing/severity/associated sxs/prior Treatment) HPI Comments: The pt is a 79 y/o male with hx of COPD - states that he saw his PMD this AM for routine f/u and then this afternoon started to get SOB with wheezing and mild cough - denies f/c/n/v and has had no dysuria, diarrhea or swellign - has not had prednisone in over a year.  He has taken his advair BID and not missing doses - has had albuterol as needed - minimal improvement today.    Patient is a 79 y.o. male presenting with shortness of breath. The history is provided by the patient.  Shortness of Breath   Past Medical History  Diagnosis Date  . COPD (chronic obstructive pulmonary disease)   . Hx-TIA (transient ischemic attack)   . Diabetes mellitus   . Hypertension   . Hyperlipidemia   . Anxiety   . CVA (cerebral vascular accident)   . Dementia    Past Surgical History  Procedure Laterality Date  . Rotator cuff repair    . Cholecystectomy N/A 02/23/2013    Procedure: LAPAROSCOPIC CHOLECYSTECTOMY;  Surgeon: Jamesetta So, MD;  Location: [facility];  Service: General;  Laterality: N/A;  . Ercp N/A 02/24/2013    Procedure: ENDOSCOPIC RETROGRADE CHOLANGIOPANCREATOGRAPHY;  Surgeon: Daneil Dolin, MD;  Location: [facility];  Service: Endoscopy;  Laterality: N/A;   Family History  Problem Relation Age of Onset  . Colon cancer Neg Hx   . Hypertension Mother   . Hypertension Father    History  Substance Use Topics  . Smoking status: Never Smoker   . Smokeless tobacco: Never Used  . Alcohol Use: No    Review of Systems  Respiratory: Positive for shortness of breath.   All other systems reviewed and are negative.     Allergies  Ace inhibitors  Home Medications   Prior to Admission  medications   Medication Sig Start Date End Date Taking? Authorizing Provider  aspirin EC 81 MG tablet Take 81 mg by mouth daily.   Yes Historical Provider, MD  donepezil ([name]) 5 MG tablet Take 1 tablet (5 mg total) by mouth at bedtime. 10/07/14  Yes Susy Frizzle, MD  Fluticasone-Salmeterol (ADVAIR DISKUS) 250-50 MCG/DOSE AEPB Inhale 1 puff into the lungs 2 (two) times daily. 10/11/14  Yes Collene Gobble, MD  losartan (COZAAR) 100 MG tablet Take 1 tablet (100 mg total) by mouth daily. 02/03/15  Yes Susy Frizzle, MD  pioglitazone (ACTOS) 30 MG tablet Take 30 mg by mouth daily.   Yes Historical Provider, MD  albuterol (PROVENTIL) (2.5 MG/3ML) 0.083% nebulizer solution Take 3 mLs (2.5 mg total) by nebulization every 6 (six) hours as needed for wheezing or shortness of breath. 02/03/15   [name], MD  predniSONE (DELTASONE) 20 MG tablet Take 2 tablets (40 mg total) by mouth daily. 02/03/15   [name], MD   BP 153/76 mmHg  Pulse 74  Temp(Src) 97.8 F (36.6 C) (Oral)  Resp 22  Ht 6' (1.829 m)  Wt 215 lb (97.523 kg)  BMI 29.15 kg/m2  SpO2 100% Physical Exam  Constitutional: He appears well-developed and well-nourished. No distress.  HENT:  Head: Normocephalic and atraumatic.  Mouth/Throat: Oropharynx is clear and moist. No oropharyngeal exudate.  Eyes: Conjunctivae and EOM are normal. Pupils are equal, round, and reactive to light. Right eye exhibits no discharge. Left eye exhibits no discharge. No scleral icterus.  Neck: Normal range of motion. Neck supple. No JVD present. No thyromegaly present.  Cardiovascular: Normal rate, regular rhythm, normal heart sounds and intact distal pulses.  Exam reveals no gallop and no friction rub.   No murmur heard. Pulmonary/Chest: Effort normal. No respiratory distress. He has wheezes ( mild exp wheezing in all lung fields - no inc WOB and no retractions / accessory muscle use). He has no rales.  Abdominal: Soft. Bowel sounds are normal. He  exhibits no distension and no mass. There is no tenderness.  Musculoskeletal: Normal range of motion. He exhibits no edema or tenderness.  Lymphadenopathy:    He has no cervical adenopathy.  Neurological: He is alert. Coordination normal.  Skin: Skin is warm and dry. No rash noted. No erythema.  Psychiatric: He has a normal mood and affect. His behavior is normal.  Nursing note and vitals reviewed.   ED Course  Procedures (including critical care time) Labs Review Labs Reviewed - No data to display  Imaging Review Dg Chest 2 View  02/03/2015   CLINICAL DATA:  Shortness of breath tonight  EXAM: CHEST  2 VIEW  COMPARISON:  March 20, 2014  FINDINGS: The heart size and mediastinal contours are stable the heart size is enlarged. There is chronic pleural thickening/ pleural effusion with scar of inferior right lung base unchanged. There is no pulmonary edema or focal pneumonia. The visualized skeletal structures are unremarkable.  IMPRESSION: No active cardiopulmonary disease.   Electronically Signed   By: Abelardo Diesel M.D.   On: 02/03/2015 20:33     EKG Interpretation   Date/Time:  Thursday February 03 2015 19:58:12 EDT Ventricular Rate:  74 PR Interval:  189 QRS Duration: 106 QT Interval:  403 QTC Calculation: 447 R Axis:   39 Text Interpretation:  Sinus rhythm Abnormal R-wave progression, early  transition Borderline T wave abnormalities Abnormal ekg since last tracing  no significant change Confirmed by Kayven Aldaco  MD, Dericka Ostenson ([id number]) on 02/03/2015  8:13:54 PM      MDM   Final diagnoses:  COPD exacerbation    Lungs are consistent with COPD exacerbation - there is no fidnings of pna or ptx on the xray. I have personally viewed and interpreted the imaging and agree with radiologist interpretation.   There is normal VS including O2 on RA - will finish tx, started short course of prednisone.  Improved with neg - reexamined after neb - sat's 100%, no wheezing, pt states feels much  better  I have personally viewed and interpreted the imaging and agree with radiologist interpretation.  = no infiltrate.  Meds given in ED:  Medications  albuterol (PROVENTIL) (2.5 MG/3ML) 0.083% nebulizer solution 5 mg (5 mg Nebulization Given 02/03/15 2024)  methylPREDNISolone sodium succinate (SOLU-MEDROL) 125 mg/2 mL injection 125 mg (125 mg Intravenous Given 02/03/15 2045)    New Prescriptions   ALBUTEROL (PROVENTIL) (2.5 MG/3ML) 0.083% NEBULIZER SOLUTION    Take 3 mLs (2.5 mg total) by nebulization every 6 (six) hours as needed for wheezing or shortness of breath.   PREDNISONE (DELTASONE) 20 MG TABLET    Take 2 tablets (40 mg total) by mouth daily.      [name], MD 02/03/15 2121

## 2015-02-03 NOTE — ED Notes (Signed)
Pt states that he went to his PCP this morning for SOB. Has hx of COPD.  Unable to catch breath this evening

## 2015-02-03 NOTE — Progress Notes (Signed)
Subjective:    Patient ID: [name], male    DOB: 08-03-1935, 79 y.o.   MRN: [id number]  HPI Patient is a 79 year old African-American male here today for follow-up of his diabetes mellitus. He has a history of stroke. He also has a history of hypertension. His blood pressure today is well controlled at 132/68. He denies any chest pain or shortness of breath. He recently had an asthma attack requiring EMS but he attributed this to the heat outside. This shortness of breath improved immediately with albuterol. Otherwise he has been doing well. He reports fasting blood sugars between 101 120. He reports two-hour postprandial sugars less than 150. However I doubt the patient is checking his sugar very often. He denies any hypoglycemic episodes. He denies any bleeding or bruising on aspirin Past Medical History  Diagnosis Date  . COPD (chronic obstructive pulmonary disease)   . Hx-TIA (transient ischemic attack)   . Diabetes mellitus   . Hypertension   . Hyperlipidemia   . Anxiety   . CVA (cerebral vascular accident)   . Dementia    Past Surgical History  Procedure Laterality Date  . Rotator cuff repair    . Cholecystectomy N/A 02/23/2013    Procedure: LAPAROSCOPIC CHOLECYSTECTOMY;  Surgeon: Jamesetta So, MD;  Location: [facility];  Service: General;  Laterality: N/A;  . Ercp N/A 02/24/2013    Procedure: ENDOSCOPIC RETROGRADE CHOLANGIOPANCREATOGRAPHY;  Surgeon: Daneil Dolin, MD;  Location: [facility];  Service: Endoscopy;  Laterality: N/A;   Current Outpatient Prescriptions on File Prior to Visit  Medication Sig Dispense Refill  . ADVAIR DISKUS 250-50 MCG/DOSE AEPB INHALE 1 PUFF INTO THE LUNGS EVERY 12 (TWELVE) HOURS. 60 each 5  . albuterol (PROVENTIL) (2.5 MG/3ML) 0.083% nebulizer solution Take 3 mLs (2.5 mg total) by nebulization every 4 (four) hours as needed for wheezing or shortness of breath. 30 vial 0  . aspirin EC 81 MG tablet Take 81 mg by mouth daily.    [name] Kitchen donepezil ([name]) 5  MG tablet Take 1 tablet (5 mg total) by mouth at bedtime. 30 tablet 5  . Fluticasone-Salmeterol (ADVAIR DISKUS) 250-50 MCG/DOSE AEPB Inhale 1 puff into the lungs 2 (two) times daily. 2 each 0  . linagliptin (TRADJENTA) 5 MG TABS tablet Take 1 tablet (5 mg total) by mouth daily. 30 tablet 3  . pioglitazone (ACTOS) 30 MG tablet Take 30 mg by mouth daily.     No current facility-administered medications on file prior to visit.   Allergies  Allergen Reactions  . Ace Inhibitors    History   Social History  . Marital Status: Married    Spouse Name: [name]  . Number of Children: 6  . Years of Education: 11th   Occupational History  . Retired    Social History Main Topics  . Smoking status: Never Smoker   . Smokeless tobacco: Never Used  . Alcohol Use: No  . Drug Use: No  . Sexual Activity: Not on file   Other Topics Concern  . Not on file   Social History Narrative   Patient lives at home with family.   Caffeine Use: 1 cup of coffee      Review of Systems  All other systems reviewed and are negative.      Objective:   Physical Exam  Constitutional: He appears well-developed and well-nourished.  Neck: Neck supple. No thyromegaly present.  Cardiovascular: Normal rate, regular rhythm, normal heart sounds and intact distal pulses.  No murmur heard. Pulmonary/Chest: Effort normal and breath sounds normal. No respiratory distress. He has no wheezes. He has no rales.  Abdominal: Soft. Bowel sounds are normal.  Musculoskeletal: He exhibits no edema.  Lymphadenopathy:    He has no cervical adenopathy.  Vitals reviewed.         Assessment & Plan:  Diabetes mellitus type II, uncontrolled - Plan: COMPLETE METABOLIC PANEL WITH GFR, Microalbumin, urine, Hemoglobin A1c, CBC with Differential/Platelet  Essential hypertension, benign  History of CVA (cerebrovascular accident)  Regarding the patient's diabetes, I will check a hemoglobin A1c as well as a urine microalbumin.  Goal hemoglobin A1c is less than 6.5. Patient's blood pressure is adequately controlled. I did suggest the patient take losartan 100 mg by mouth daily rather than 50 mg a day to maximize his renal protection. I would like the patient to return fasting for fasting lipid panel. Given his history of stroke, his goal LDL cholesterol is less than 70.

## 2015-02-04 LAB — HEMOGLOBIN A1C
Hgb A1c MFr Bld: 6.9 % — ABNORMAL HIGH (ref <5.7)
Mean Plasma Glucose: 151 mg/dL — ABNORMAL HIGH (ref <117)

## 2015-02-04 LAB — MICROALBUMIN, URINE: Microalb, Ur: 7 mg/dL — ABNORMAL HIGH (ref <2.0)

## 2015-03-25 ENCOUNTER — Ambulatory Visit (HOSPITAL_COMMUNITY)
Admission: RE | Admit: 2015-03-25 | Discharge: 2015-03-25 | Disposition: A | Discharge status: Home / Self Care | Payer: Medicare Other | Source: Ambulatory Visit | Attending: Emergency Medicine | Admitting: Emergency Medicine

## 2015-03-25 DIAGNOSIS — R918 Other nonspecific abnormal finding of lung field: Secondary | ICD-10-CM | POA: Diagnosis not present

## 2015-03-25 DIAGNOSIS — J9811 Atelectasis: Secondary | ICD-10-CM

## 2015-03-25 DIAGNOSIS — I2510 Atherosclerotic heart disease of native coronary artery without angina pectoris: Secondary | ICD-10-CM | POA: Insufficient documentation

## 2015-03-25 DIAGNOSIS — R0600 Dyspnea, unspecified: Secondary | ICD-10-CM | POA: Insufficient documentation

## 2015-03-25 DIAGNOSIS — J90 Pleural effusion, not elsewhere classified: Secondary | ICD-10-CM | POA: Diagnosis not present

## 2015-03-29 ENCOUNTER — Telehealth: Payer: Self-pay | Admitting: Emergency Medicine

## 2015-03-29 NOTE — Telephone Encounter (Signed)
Pt had CT done 9/16 and is requesting results.  Please advise thanks

## 2015-03-29 NOTE — Telephone Encounter (Signed)
Please let the patient know that his right lower lobe rounded area is smaller than it was on his previous imaging. This is good news and suggests that this is an area of scar in the aftermath of your pneumonia. We will continue to follow this area but I do not believe you need any biopsy or invasive testing at this time. We can discuss further at his follow-up visit

## 2015-03-29 NOTE — Telephone Encounter (Signed)
Attempted to call pt's wife. No answer and her mailbox was full. Will try back.

## 2015-03-30 ENCOUNTER — Telehealth: Payer: Self-pay | Admitting: Emergency Medicine

## 2015-03-30 NOTE — Telephone Encounter (Signed)
ATC, mailbox was full, [facility]

## 2015-03-30 NOTE — Telephone Encounter (Signed)
Called [facility] and reached a VM--lmtcb

## 2015-03-30 NOTE — Telephone Encounter (Signed)
Spoke with the pt's spouse and notified of recs per RB She states that pt is having more SOB and needs to be seen  OV with RB at 9:45 am tommorrow

## 2015-03-30 NOTE — Telephone Encounter (Signed)
Pt returned call [phone]

## 2015-03-31 ENCOUNTER — Encounter: Payer: Self-pay | Admitting: Gastroenterology

## 2015-03-31 ENCOUNTER — Encounter: Payer: Self-pay | Admitting: Emergency Medicine

## 2015-03-31 ENCOUNTER — Ambulatory Visit (INDEPENDENT_AMBULATORY_CARE_PROVIDER_SITE_OTHER): Payer: Medicare Other | Admitting: Emergency Medicine

## 2015-03-31 VITALS — BP 130/72 | HR 82 | Ht 72.0 in | Wt 220.0 lb

## 2015-03-31 DIAGNOSIS — J45909 Unspecified asthma, uncomplicated: Secondary | ICD-10-CM | POA: Diagnosis not present

## 2015-03-31 DIAGNOSIS — J9811 Atelectasis: Secondary | ICD-10-CM

## 2015-03-31 MED ORDER — BUDESONIDE 0.25 MG/2ML IN SUSP: 0.2500 mg | Freq: Two times a day (BID) | RESPIRATORY_TRACT | Status: DC

## 2015-03-31 MED ORDER — ALBUTEROL SULFATE (2.5 MG/3ML) 0.083% IN NEBU: 2.5000 mg | INHALATION_SOLUTION | Freq: Four times a day (QID) | RESPIRATORY_TRACT | Status: DC | PRN

## 2015-03-31 MED ORDER — ARFORMOTEROL TARTRATE 15 MCG/2ML IN NEBU: 15.0000 ug | INHALATION_SOLUTION | Freq: Two times a day (BID) | RESPIRATORY_TRACT | Status: DC

## 2015-03-31 NOTE — Addendum Note (Signed)
Addended by: Raymondo Band D on: 03/31/2015 11:20 AM   Modules accepted: Orders, Medications

## 2015-03-31 NOTE — Telephone Encounter (Signed)
Spoke with [facility]. They are faxing over a medicare order form for pt albuterol neb solution that needs to be filled out and signed since he is transferring pharmacies to them. Fax will be sent upfront. Will await fax

## 2015-03-31 NOTE — Patient Instructions (Signed)
We will try changing your Advair to nebulized medications Brovana and budesonide twice a day as below:  STOP Advair for now We will replace Advair with Brovana nebulized twice a day + budesonide (Pulmicort) nebulized twice a day. It is Ok to mix these two medicines in the nebulizer to take morning and evening Continue to use albuterol nebulized if needed for shortness of breath in between your scheduled medicine. You may use up to every 4 hours if needed to help your breathing.  You CT scan of the chest was improved. You should not need a repeat scan.  Follow with Dr Lamonte Sakai in 2 months or sooner if you have any problems.

## 2015-03-31 NOTE — Addendum Note (Signed)
Addended by: Osa Craver on: 03/31/2015 11:15 AM   Modules accepted: Orders

## 2015-03-31 NOTE — Assessment & Plan Note (Signed)
His right lower lobe atelectasis is smaller on current CT scan than the PET scan performed one year ago. I do not believe he needs any further scanning

## 2015-03-31 NOTE — Addendum Note (Signed)
Addended by: Osa Craver on: 03/31/2015 11:26 AM   Modules accepted: Orders

## 2015-03-31 NOTE — Progress Notes (Signed)
Subjective:    Patient ID: [name], male    DOB: 1936-06-06, 79 y.o.   MRN: [id number]  HPI 79 yo former smoker (very remote), hx of HTN, DM, CVA. Carries the dx of COPD / Asthma although never smoker. He was admitted to [facility] with slowly progressive dyspnea mid-May. Found to have a R pleural effusion > thora  Pleural fluid 11/25/13 > alb 3.0, TP 5.3, glu 162, LDH 159.  Cytology negative, WBC 1378 predominant lymphs no PMN, cx negative  ROV 01/29/14 -- follows for a R effusion and abnormal CT scan chest. The pleural effusion was well drained on 6/18, allows visualization of a rounded RLL opacity that looks like Atx, consider also malignancy but less likely. The repeat cytology is negative for malignancy. Returns today with a CXR. He improved with the thora and has not declined since then.   I think at this point we can follow him for re-accumulation. It the effusion returns then we will likely pursue repeat thora + biopsy or pleuroscopy. He may merit FOB or VATS to sample the RLL region of consolidation. If the fluid does not reaccumulate then I will plan to repeat his Ct scan in 2-3 months. Please schedule him to come back to see me in 2-3 weeks with a CXR.  ROV 04/01/14 -- R effusion and abnormal area of rounded RLL infiltrate. Repeat CT scan 9/16 >> the RLL opacity is more rounded, has caused some basilar volume loss. No clear mass seen. He has felt well - has had cough and associated dyspnea. He relates this to missing his Advair. No sputum or hemoptysis  ROV 04/21/14 -- follow up for RLL rounded infiltrate vs mass. His PET scan was reassuring - The RLL lesion looks like rounded atelectasis.   ROV 03/31/15 -- , follow-up visit for minimal tobacco abuse but with signs and symptoms suggestive of COPD/asthma. We have been following an area of rounded airspace opacity in the right lower lobe. His most recent CT scan of the chest was 03/25/15 that I have reviewed personally. It showed that the  rounded area is slightly smaller then his CT scan and PET scan a year ago. Most consistent with rounded atelectasis.  He is having     Review of Systems  Constitutional: Negative for fever and appetite change.  HENT: Negative for congestion, dental problem, ear pain, nosebleeds, postnasal drip, rhinorrhea, sinus pressure, sneezing, sore throat and trouble swallowing.   Eyes: Negative for redness and itching.  Respiratory: Positive for cough. Negative for chest tightness, shortness of breath and wheezing.   Cardiovascular: Negative for palpitations and leg swelling.  Gastrointestinal: Negative for nausea and vomiting.  Genitourinary: Negative for dysuria.  Musculoskeletal: Negative for joint swelling.  Skin: Negative for rash.  Neurological: Negative for headaches.  Hematological: Does not bruise/bleed easily.  Psychiatric/Behavioral: Negative for dysphoric mood. The patient is not nervous/anxious.        Objective:   Physical Exam Filed Vitals:   03/31/15 0954  BP: 130/72  Pulse: 82  Height: 6' (1.829 m)  Weight: 220 lb (99.791 kg)  SpO2: 97%   Gen: Pleasant, well-nourished, in no distress,  normal affect  ENT: No lesions,  mouth clear,  oropharynx clear, no postnasal drip  Neck: No JVD, no TMG, no carotid bruits  Lungs: No use of accessory muscles, clear without rales or rhonchi, decreased at the R base, no crackles.   Cardiovascular: RRR, heart sounds normal, no murmur or gallops, no peripheral edema  Musculoskeletal: No deformities, no cyanosis or clubbing  Neuro: alert, non focal  Skin: Warm, no lesions or rashes     Assessment & Plan:  Intrinsic asthma undertreated at this point. I believe he likely has component of COPD based on fixed asthma. He may benefit from LAMA at some point. For now I would like to change his Advair to brovana/budesonide nebulized as he appears to be better delivery from nebulized medication. He will continue to use albuterol when  necessary. I will perform full pulmonary function testing at his next visit  Atelectasis of right lung His right lower lobe atelectasis is smaller on current CT scan than the PET scan performed one year ago. I do not believe he needs any further scanning

## 2015-03-31 NOTE — Assessment & Plan Note (Signed)
undertreated at this point. I believe he likely has component of COPD based on fixed asthma. He may benefit from LAMA at some point. For now I would like to change his Advair to brovana/budesonide nebulized as he appears to be better delivery from nebulized medication. He will continue to use albuterol when necessary. I will perform full pulmonary function testing at his next visit

## 2015-04-04 ENCOUNTER — Telehealth: Payer: Self-pay | Admitting: Emergency Medicine

## 2015-04-04 NOTE — Telephone Encounter (Signed)
This form was received last week and was signed and faxed back to [place]. Nothing further was needed.

## 2015-04-04 NOTE — Telephone Encounter (Signed)
Spoke with pt's wife. Advised her that we have samples of Brovana put not Pulmicort. Brovana samples have been left at the front desk for pick up. Nothing further was needed.

## 2015-04-15 ENCOUNTER — Other Ambulatory Visit: Payer: Self-pay | Admitting: Family Medicine

## 2015-04-15 NOTE — Telephone Encounter (Signed)
Refill appropriate and filled per protocol. 

## 2015-04-16 ENCOUNTER — Other Ambulatory Visit: Payer: Self-pay | Admitting: Family Medicine

## 2015-04-18 NOTE — Telephone Encounter (Signed)
Refill appropriate and filled per protocol. 

## 2015-04-28 DIAGNOSIS — J45909 Unspecified asthma, uncomplicated: Secondary | ICD-10-CM | POA: Diagnosis not present

## 2015-05-27 ENCOUNTER — Telehealth: Payer: Self-pay | Admitting: Emergency Medicine

## 2015-05-27 DIAGNOSIS — J45909 Unspecified asthma, uncomplicated: Secondary | ICD-10-CM | POA: Diagnosis not present

## 2015-05-27 MED ORDER — ARFORMOTEROL TARTRATE 15 MCG/2ML IN NEBU: 15.0000 ug | INHALATION_SOLUTION | Freq: Two times a day (BID) | RESPIRATORY_TRACT | Status: DC

## 2015-05-27 MED ORDER — BUDESONIDE 0.25 MG/2ML IN SUSP: 0.2500 mg | Freq: Two times a day (BID) | RESPIRATORY_TRACT | Status: DC

## 2015-05-27 NOTE — Telephone Encounter (Signed)
Called and spoke with pt;s spouse Spouse requesting samples of pulmicort and brovana Informed spouse that office does have samples of brovana but not of pulmicort Spouse then asked instead of samples to call in refill to [facility] in [place], [place] Informed wife that refills would be sent  Refills sent electronically  Nothing further is needed

## 2015-05-29 DIAGNOSIS — J45909 Unspecified asthma, uncomplicated: Secondary | ICD-10-CM | POA: Diagnosis not present

## 2015-05-30 ENCOUNTER — Telehealth: Payer: Self-pay | Admitting: Emergency Medicine

## 2015-05-30 NOTE — Telephone Encounter (Signed)
error 

## 2015-06-01 ENCOUNTER — Telehealth: Payer: Self-pay | Admitting: Emergency Medicine

## 2015-06-01 NOTE — Telephone Encounter (Signed)
LMTCB x 1 

## 2015-06-01 NOTE — Telephone Encounter (Signed)
Pt started on Brovana and Pulmicort last OV - Advair d/c'd Pt wife states that the [place] is too expensive and would like to know if there is something else he can take via neb or another inhaler regimen.  I gave enough samples to get him through the weekend. Please advise Dr Lamonte Sakai. Thanks.

## 2015-06-03 NOTE — Telephone Encounter (Signed)
LMTCB

## 2015-06-06 ENCOUNTER — Ambulatory Visit: Payer: Medicare Other | Admitting: Family Medicine

## 2015-06-06 NOTE — Telephone Encounter (Signed)
Left message for Laurey Arrow to return call.

## 2015-06-07 ENCOUNTER — Ambulatory Visit: Payer: Medicare Other | Admitting: Emergency Medicine

## 2015-06-07 NOTE — Telephone Encounter (Signed)
Yes this is OK 

## 2015-06-07 NOTE — Telephone Encounter (Signed)
I think it would be best to go with original plan of Brovana / pulmicort bid if this is affordable.

## 2015-06-07 NOTE — Telephone Encounter (Signed)
2 TE's have been created regarding this patient's medications.  Combined them into 1 TE. Closing this encounter.

## 2015-06-07 NOTE — Telephone Encounter (Signed)
TE closed by accident ---------------------------------  Received call from Laurey Arrow at [facility], she said that they shipped out compressor and Albuterol neb med on 04/28/15 based on the order that was signed by Dr. Lamonte Sakai on 9/22.  They said that the order specified that patient was to take Albuterol 3 times daily.  They are requesting a copy of an updated med list that specifies Albuterol 3 times daily, she said that they will not be able to get paid for the med they shipped if our med list does not specify 3 times daily.  However, our med list specifies every 6 hours prn.  Per Dr. Agustina Caroli last OV note, patient was to stop Advair, start Budesonide 2 times daily and Brovana 2 times daily.  Wife called on 11/23 and advised that RX was too expensive.  However, Cindy from [facility] says that they never received a prescription for Brovana or Budesonide and they showed that patient is still taking Advair.  She said that the medication would be cheaper for the patient through their pharmacy.    Dr. Lamonte Sakai, please advise.

## 2015-06-07 NOTE — Telephone Encounter (Signed)
Could try changing him to either symbicort bid or dulera bid via a spacer, whichever is most affordable.

## 2015-06-07 NOTE — Telephone Encounter (Signed)
Spoke with Jenny Reichmann at [facility]:  She stated that it would be cheaper for patient to get Brovana/Pulmicort through.  She said that she will fax over form to be completed for medications.  Will await fax.  She needs a copy of current medication list sent with the form.  Also, she is asking that we send them a letter stating : "delayed entry for OV 03/31/15, Albuterol Nebulizer to be taken 3 times daily for Shortness of Breath".  So they can use this letter to cover the expense of sending the Compressor and Neb medication on 03/31/15.    Dr. Lamonte Sakai - please advise if okay to do letter.

## 2015-06-07 NOTE — Telephone Encounter (Signed)
2 telephone encounters have been put in regarding patient's medications... Combined into 1 encounter.    Status: Signed       Expand All Collapse All   I think it would be best to go with original plan of Brovana / pulmicort bid if this is affordable.             Glean Hess, CMA at 06/07/2015 11:31 AM     Status: Signed       Expand All Collapse All   TE closed by accident ---------------------------------  Received call from Laurey Arrow at [facility], she said that they shipped out compressor and Albuterol neb med on 04/28/15 based on the order that was signed by Dr. Lamonte Sakai on 9/22. They said that the order specified that patient was to take Albuterol 3 times daily. They are requesting a copy of an updated med list that specifies Albuterol 3 times daily, she said that they will not be able to get paid for the med they shipped if our med list does not specify 3 times daily. However, our med list specifies every 6 hours prn.  Per Dr. Agustina Caroli last OV note, patient was to stop Advair, start Budesonide 2 times daily and Brovana 2 times daily. Wife called on 11/23 and advised that RX was too expensive. However, Cindy from [facility] says that they never received a prescription for Brovana or Budesonide and they showed that patient is still taking Advair. She said that the medication would be cheaper for the patient through their pharmacy.   Dr. Lamonte Sakai, please advise.

## 2015-06-08 ENCOUNTER — Other Ambulatory Visit: Payer: Self-pay | Admitting: *Deleted

## 2015-06-08 MED ORDER — ALBUTEROL SULFATE (2.5 MG/3ML) 0.083% IN NEBU: 2.5000 mg | INHALATION_SOLUTION | Freq: Four times a day (QID) | RESPIRATORY_TRACT | Status: DC | PRN

## 2015-06-08 MED ORDER — ARFORMOTEROL TARTRATE 15 MCG/2ML IN NEBU: 15.0000 ug | INHALATION_SOLUTION | Freq: Two times a day (BID) | RESPIRATORY_TRACT | Status: DC

## 2015-06-08 MED ORDER — BUDESONIDE 0.25 MG/2ML IN SUSP: 0.2500 mg | Freq: Two times a day (BID) | RESPIRATORY_TRACT | Status: DC

## 2015-06-09 ENCOUNTER — Telehealth: Payer: Self-pay | Admitting: Emergency Medicine

## 2015-06-09 NOTE — Telephone Encounter (Signed)
Duplicate messages, please refer to first telephone encounter from 06/01/15. Will sign off.

## 2015-06-09 NOTE — Telephone Encounter (Signed)
Called and spoke to [place] and informed her the albuterol is ok to be TID per RB. Cindy verbalized understanding and also requested we send the pt's updated med list with albuterol neb on there in lue of the letter. Jenny Reichmann stated she is faxing the form to the front fax today and she would like the update med list faxed to her attention at [phone]. Med list updated and faxed to requested number.   Will send message to Ria Comment to look out for form that needs to be signed by RB.

## 2015-06-09 NOTE — Addendum Note (Signed)
Addended by: Maurice March on: 06/09/2015 01:27 PM   Modules accepted: Orders, Medications

## 2015-06-11 ENCOUNTER — Encounter (HOSPITAL_COMMUNITY): Payer: Self-pay | Admitting: Emergency Medicine

## 2015-06-11 ENCOUNTER — Emergency Department (HOSPITAL_COMMUNITY)
Admission: EM | Admit: 2015-06-11 | Discharge: 2015-06-11 | Disposition: A | Discharge status: Home / Self Care | Payer: Medicare Other | Attending: Emergency Medicine | Admitting: Emergency Medicine

## 2015-06-11 ENCOUNTER — Emergency Department (HOSPITAL_COMMUNITY): Payer: Medicare Other

## 2015-06-11 DIAGNOSIS — R0602 Shortness of breath: Secondary | ICD-10-CM | POA: Diagnosis not present

## 2015-06-11 DIAGNOSIS — Z7982 Long term (current) use of aspirin: Secondary | ICD-10-CM | POA: Insufficient documentation

## 2015-06-11 DIAGNOSIS — J90 Pleural effusion, not elsewhere classified: Secondary | ICD-10-CM | POA: Diagnosis not present

## 2015-06-11 DIAGNOSIS — Z79899 Other long term (current) drug therapy: Secondary | ICD-10-CM | POA: Diagnosis not present

## 2015-06-11 DIAGNOSIS — Z8673 Personal history of transient ischemic attack (TIA), and cerebral infarction without residual deficits: Secondary | ICD-10-CM | POA: Insufficient documentation

## 2015-06-11 DIAGNOSIS — I10 Essential (primary) hypertension: Secondary | ICD-10-CM | POA: Insufficient documentation

## 2015-06-11 DIAGNOSIS — F0390 Unspecified dementia without behavioral disturbance: Secondary | ICD-10-CM | POA: Insufficient documentation

## 2015-06-11 DIAGNOSIS — J441 Chronic obstructive pulmonary disease with (acute) exacerbation: Secondary | ICD-10-CM | POA: Diagnosis not present

## 2015-06-11 DIAGNOSIS — E119 Type 2 diabetes mellitus without complications: Secondary | ICD-10-CM | POA: Diagnosis not present

## 2015-06-11 DIAGNOSIS — R0600 Dyspnea, unspecified: Secondary | ICD-10-CM | POA: Diagnosis not present

## 2015-06-11 MED ORDER — IPRATROPIUM-ALBUTEROL 0.5-2.5 (3) MG/3ML IN SOLN
3.0000 mL | Freq: Four times a day (QID) | RESPIRATORY_TRACT | Status: DC
  Administered 2015-06-11: 3 mL via RESPIRATORY_TRACT
  Filled 2015-06-11 (×2): qty 3

## 2015-06-11 MED ORDER — PREDNISONE 20 MG PO TABS: 40.0000 mg | ORAL_TABLET | Freq: Every day | ORAL | Status: DC

## 2015-06-11 MED ORDER — GUAIFENESIN-CODEINE 100-10 MG/5ML PO SOLN: 10.0000 mL | Freq: Three times a day (TID) | ORAL | Status: DC | PRN

## 2015-06-11 MED ORDER — PREDNISONE 50 MG PO TABS
ORAL_TABLET | ORAL | Status: AC
  Filled 2015-06-11: qty 1

## 2015-06-11 MED ORDER — PREDNISONE 10 MG PO TABS
60.0000 mg | ORAL_TABLET | Freq: Once | ORAL | Status: AC
  Administered 2015-06-11: 60 mg via ORAL
  Filled 2015-06-11: qty 1

## 2015-06-11 NOTE — Discharge Instructions (Signed)

## 2015-06-11 NOTE — ED Notes (Signed)
Pt states that he has had a bad cough with shortness of breath since yesterday.

## 2015-06-11 NOTE — ED Provider Notes (Signed)
CSN: [id number]     Arrival date & time 06/11/15  1306 History   First MD Initiated Contact with Patient 06/11/15 1321     Chief Complaint  Patient presents with  . Shortness of Breath     (Consider location/radiation/quality/duration/timing/severity/associated sxs/prior Treatment) HPI   79 year old male with cough and shortness of breath. Onset about a day ago persistent since then. Feels like he is wheezing. Mild shortness of breath. No fevers or chills. Denies any acute pain. Unusual leg pain or swelling. No orthopnea.  Past Medical History  Diagnosis Date  . COPD (chronic obstructive pulmonary disease) ([place])   . Hx-TIA (transient ischemic attack)   . Diabetes mellitus   . Hypertension   . Hyperlipidemia   . Anxiety   . CVA (cerebral vascular accident) ([place])   . Dementia    Past Surgical History  Procedure Laterality Date  . Rotator cuff repair    . Cholecystectomy N/A 02/23/2013    Procedure: LAPAROSCOPIC CHOLECYSTECTOMY;  Surgeon: Jamesetta So, MD;  Location: [facility];  Service: General;  Laterality: N/A;  . Ercp N/A 02/24/2013    Procedure: ENDOSCOPIC RETROGRADE CHOLANGIOPANCREATOGRAPHY;  Surgeon: Daneil Dolin, MD;  Location: [facility];  Service: Endoscopy;  Laterality: N/A;   Family History  Problem Relation Age of Onset  . Colon cancer Neg Hx   . Hypertension Mother   . Hypertension Father    Social History  Substance Use Topics  . Smoking status: Never Smoker   . Smokeless tobacco: Never Used  . Alcohol Use: No    Review of Systems  All systems reviewed and negative, other than as noted in HPI.   Allergies  Ace inhibitors  Home Medications   Prior to Admission medications   Medication Sig Start Date End Date Taking? Authorizing Provider  albuterol (PROVENTIL) (2.5 MG/3ML) 0.083% nebulizer solution Take 2.5 mg by nebulization 3 (three) times daily.   Yes Historical Provider, MD  arformoterol (BROVANA) 15 MCG/2ML NEBU Take 2 mLs (15 mcg total) by  nebulization 2 (two) times daily. 06/08/15  Yes Collene Gobble, MD  aspirin EC 81 MG tablet Take 81 mg by mouth daily.   Yes Historical Provider, MD  donepezil ([name]) 5 MG tablet Take 1 tablet (5 mg total) by mouth at bedtime. 10/07/14  Yes Susy Frizzle, MD  losartan (COZAAR) 100 MG tablet Take 1 tablet (100 mg total) by mouth daily. 02/03/15  Yes Susy Frizzle, MD  pioglitazone (ACTOS) 30 MG tablet TAKE 1 TABLET (30 MG TOTAL) BY MOUTH DAILY. 04/18/15  Yes Susy Frizzle, MD  budesonide (PULMICORT) 0.25 MG/2ML nebulizer solution Take 2 mLs (0.25 mg total) by nebulization 2 (two) times daily. Patient not taking: Reported on 06/11/2015 06/08/15   Collene Gobble, MD  losartan (COZAAR) 50 MG tablet TAKE 1 TABLET (50 MG TOTAL) BY MOUTH DAILY. Patient not taking: Reported on 06/11/2015 04/15/15   Susy Frizzle, MD  predniSONE (DELTASONE) 20 MG tablet Take 2 tablets (40 mg total) by mouth daily. Patient not taking: Reported on 03/31/2015 02/03/15   [name], MD   BP 140/85 mmHg  Pulse 69  Temp(Src) 98.2 F (36.8 C) (Oral)  Resp 18  Ht 6' (1.829 m)  Wt 215 lb (97.523 kg)  BMI 29.15 kg/m2  SpO2 97% Physical Exam  Constitutional: He appears well-developed and well-nourished. No distress.  HENT:  Head: Normocephalic and atraumatic.  Eyes: Conjunctivae are normal. Right eye exhibits no discharge. Left eye exhibits no discharge.  Neck: Neck supple.  Cardiovascular: Normal rate, regular rhythm and normal heart sounds.  Exam reveals no gallop and no friction rub.   No murmur heard. Pulmonary/Chest: Effort normal. No respiratory distress. He has wheezes.  Abdominal: Soft. He exhibits no distension. There is no tenderness.  Musculoskeletal: He exhibits no edema or tenderness.  Lower extremities symmetric as compared to each other. No calf tenderness. Negative Homan's. No palpable cords.   Neurological: He is alert.  Skin: Skin is warm and dry.  Psychiatric: He has a normal mood and  affect. His behavior is normal. Thought content normal.  Nursing note and vitals reviewed.   ED Course  Procedures (including critical care time) Labs Review Labs Reviewed - No data to display  Imaging Review Dg Chest 2 View  06/11/2015  CLINICAL DATA:  Dyspnea today.  Chest congestion.  COPD. EXAM: CHEST  2 VIEW COMPARISON:  Previous examinations, including the chest radiographs dated 02/03/2015 and chest CT dated 03/25/2015. FINDINGS: The cardiac silhouette remains mildly enlarged. The previously demonstrated mass-like area of probable rounded atelectasis in the posterior aspect of the right lower lobe has not changed significantly. Mild central peribronchial thickening is unchanged. A a small amount of right pleural fluid and thickening is also stable. Clear left lung. Thoracic spine degenerative changes. IMPRESSION: 1. No acute abnormality. 2. Stable probable rounded atelectasis in the posterior aspect of the right lower lobe. 3. Stable small right pleural effusion and pleural thickening. Electronically Signed   By: Claudie Revering M.D.   On: 06/11/2015 14:43   I have personally reviewed and evaluated these images and lab results as part of my medical decision-making.   EKG Interpretation   Date/Time:  Saturday June 11 2015 13:49:24 EST Ventricular Rate:  76 PR Interval:  194 QRS Duration: 104 QT Interval:  408 QTC Calculation: 459 R Axis:   40 Text Interpretation:  Sinus rhythm Borderline T wave abnormalities ED  PHYSICIAN INTERPRETATION AVAILABLE IN [facility] Confirmed by TEST,  Record ([id number]) on 06/12/2015 9:00:30 AM      MDM   Final diagnoses:  Dyspnea    79 year old male with shortness of breath. Wheezing on exam. Her sounding cough. Not much more comfortable after her nap. Continued steroids. I feel appropriate for further outpatient management.    Virgel Manifold, MD 06/18/15 [phone]

## 2015-06-13 ENCOUNTER — Telehealth: Payer: Self-pay | Admitting: Emergency Medicine

## 2015-06-13 NOTE — Telephone Encounter (Signed)
I've never heard of this guideline.  We will need to investigate this - should be able to get it to take q4h prn, larger supply

## 2015-06-13 NOTE — Telephone Encounter (Signed)
Called spoke with Cyndi from [facility]. Since pt is on brovana and budesonide they can only provide 30 vials of the albuterol nebulizer per medicare guidelines. They need written RX for 30 vials only.  RX states pt can take TID bu they can only provide 30 vials for pt for a 1 month supply.  Please advise RB if okay? thanks

## 2015-06-14 ENCOUNTER — Telehealth: Payer: Self-pay | Admitting: Emergency Medicine

## 2015-06-14 NOTE — Telephone Encounter (Signed)
Spoke with pt's wife, advised we have no brovana samples at this time.  Nothing further needed.

## 2015-06-14 NOTE — Telephone Encounter (Signed)
Spoke with [place] with Jenny Reichmann and her Supervisor - was explained that per Medicare Guidelines, when a patient is on a LABA they should not need a larger quantity of Albuterol. Medicare will only pay for 30 vials per month when the patient is using it in combination with the LABA. If the patient needs additional Albuterol, they can purchase this out of pocket through their local pharmacy and it can also be purchased through [facility]. It was also explained that it is better on the patient to purchase any extra Albuterol through a local pharmacy than it is to purchase through [facility] bc it requires a lot of paperwork through them where the local pharmacy does not.   Will send to [place] to inform.

## 2015-06-14 NOTE — Telephone Encounter (Signed)
Then he only gets 30 per month. Let him know that they won't pay for any more than that

## 2015-06-15 NOTE — Telephone Encounter (Signed)
Patient's wife returned call. I explained to her the below information considering the albuterol. She stated that the Brovana and Budesonide was to expensive. She stated that the patient had enough medication to last until his ov on 06/24/15. I explained to her that this issue had been addressed before and RB would change the patient to dulera or symbicort. She stated that she would discuss this at ov. Patient voiced understanding and had no further questions.

## 2015-06-15 NOTE — Telephone Encounter (Signed)
ATC pt, NA and unable to leave VM since MB is full

## 2015-06-21 ENCOUNTER — Encounter (HOSPITAL_COMMUNITY): Payer: Self-pay | Admitting: *Deleted

## 2015-06-21 ENCOUNTER — Emergency Department (HOSPITAL_COMMUNITY)
Admission: EM | Admit: 2015-06-21 | Discharge: 2015-06-22 | Disposition: A | Discharge status: Home / Self Care | Payer: Medicare Other | Attending: Emergency Medicine | Admitting: Emergency Medicine

## 2015-06-21 ENCOUNTER — Emergency Department (HOSPITAL_COMMUNITY): Payer: Medicare Other

## 2015-06-21 DIAGNOSIS — Z79899 Other long term (current) drug therapy: Secondary | ICD-10-CM | POA: Insufficient documentation

## 2015-06-21 DIAGNOSIS — E119 Type 2 diabetes mellitus without complications: Secondary | ICD-10-CM | POA: Insufficient documentation

## 2015-06-21 DIAGNOSIS — J441 Chronic obstructive pulmonary disease with (acute) exacerbation: Secondary | ICD-10-CM | POA: Insufficient documentation

## 2015-06-21 DIAGNOSIS — I10 Essential (primary) hypertension: Secondary | ICD-10-CM | POA: Diagnosis not present

## 2015-06-21 DIAGNOSIS — R0682 Tachypnea, not elsewhere classified: Secondary | ICD-10-CM | POA: Diagnosis not present

## 2015-06-21 DIAGNOSIS — Z7982 Long term (current) use of aspirin: Secondary | ICD-10-CM | POA: Insufficient documentation

## 2015-06-21 DIAGNOSIS — Z8659 Personal history of other mental and behavioral disorders: Secondary | ICD-10-CM | POA: Diagnosis not present

## 2015-06-21 DIAGNOSIS — R0602 Shortness of breath: Secondary | ICD-10-CM | POA: Diagnosis not present

## 2015-06-21 DIAGNOSIS — R000 Tachycardia, unspecified: Secondary | ICD-10-CM | POA: Diagnosis not present

## 2015-06-21 DIAGNOSIS — Z8673 Personal history of transient ischemic attack (TIA), and cerebral infarction without residual deficits: Secondary | ICD-10-CM | POA: Diagnosis not present

## 2015-06-21 DIAGNOSIS — F0390 Unspecified dementia without behavioral disturbance: Secondary | ICD-10-CM | POA: Diagnosis not present

## 2015-06-21 LAB — BASIC METABOLIC PANEL
Anion gap: 12 (ref 5–15)
BUN: 17 mg/dL (ref 6–20)
CALCIUM: 9.2 mg/dL (ref 8.9–10.3)
CO2: 24 mmol/L (ref 22–32)
Chloride: 103 mmol/L (ref 101–111)
Creatinine, Ser: 1.28 mg/dL — ABNORMAL HIGH (ref 0.61–1.24)
GFR calc Af Amer: >60 mL/min (ref >60)
GFR calc non Af Amer: 52 mL/min — ABNORMAL LOW (ref >60)
GLUCOSE: 233 mg/dL — AB (ref 65–99)
Potassium: 3.6 mmol/L (ref 3.5–5.1)
Sodium: 139 mmol/L (ref 135–145)

## 2015-06-21 LAB — CBC WITH DIFFERENTIAL/PLATELET
Basophils Absolute: 0.1 10*3/uL (ref 0.0–0.1)
Basophils Relative: 1 %
EOS ABS: 0.8 10*3/uL — AB (ref 0.0–0.7)
Eosinophils Relative: 10 %
HCT: 37.9 % — ABNORMAL LOW (ref 39.0–52.0)
Hemoglobin: 13.2 g/dL (ref 13.0–17.0)
LYMPHS ABS: 1.7 10*3/uL (ref 0.7–4.0)
Lymphocytes Relative: 21 %
MCH: 30.9 pg (ref 26.0–34.0)
MCHC: 34.8 g/dL (ref 30.0–36.0)
MCV: 88.8 fL (ref 78.0–100.0)
MONOS PCT: 8 %
Monocytes Absolute: 0.7 10*3/uL (ref 0.1–1.0)
Neutro Abs: 4.9 10*3/uL (ref 1.7–7.7)
Neutrophils Relative %: 60 %
PLATELETS: 199 10*3/uL (ref 150–400)
RBC: 4.27 MIL/uL (ref 4.22–5.81)
RDW: 13.3 % (ref 11.5–15.5)
WBC: 8.2 10*3/uL (ref 4.0–10.5)

## 2015-06-21 LAB — TROPONIN I: Troponin I: 0.03 ng/mL (ref <0.031)

## 2015-06-21 MED ORDER — ALBUTEROL (5 MG/ML) CONTINUOUS INHALATION SOLN
10.0000 mg/h | INHALATION_SOLUTION | RESPIRATORY_TRACT | Status: AC
  Administered 2015-06-21: 10 mg/h via RESPIRATORY_TRACT
  Filled 2015-06-21: qty 20

## 2015-06-21 MED ORDER — PREDNISONE 20 MG PO TABS: 40.0000 mg | ORAL_TABLET | Freq: Every day | ORAL | Status: DC

## 2015-06-21 MED ORDER — IPRATROPIUM BROMIDE 0.02 % IN SOLN
0.5000 mg | RESPIRATORY_TRACT | Status: AC
  Administered 2015-06-21: 0.5 mg via RESPIRATORY_TRACT
  Filled 2015-06-21: qty 2.5

## 2015-06-21 NOTE — ED Notes (Signed)
Pt brought in by [facility] for c/o sob that occurred over the last couple of days; pt has been taking breathing treatments at home all day with no relief

## 2015-06-21 NOTE — Discharge Instructions (Signed)
Albuterol every 4 hours for 24 hours, then as needed Prednisone daily for 5 days  Please obtain all of your results from medical records or have your doctors office obtain the results - share them with your doctor - you should be seen at your doctors office in the next 2 days. Call today to arrange your follow up. Take the medications as prescribed. Please review all of the medicines and only take them if you do not have an allergy to them. Please be aware that if you are taking birth control pills, taking other prescriptions, ESPECIALLY ANTIBIOTICS may make the birth control ineffective - if this is the case, either do not engage in sexual activity or use alternative methods of birth control such as condoms until you have finished the medicine and your family doctor says it is OK to restart them. If you are on a blood thinner such as COUMADIN, be aware that any other medicine that you take may cause the coumadin to either work too much, or not enough - you should have your coumadin level rechecked in next 7 days if this is the case.  ?  It is also a possibility that you have an allergic reaction to any of the medicines that you have been prescribed - Everybody reacts differently to medications and while MOST people have no trouble with most medicines, you may have a reaction such as nausea, vomiting, rash, swelling, shortness of breath. If this is the case, please stop taking the medicine immediately and contact your physician.  ?  You should return to the ER if you develop severe or worsening symptoms.

## 2015-06-21 NOTE — ED Notes (Signed)
Patient ambulated in Department with O2. O2 saturation readings between 88-100 percent. Patient states that he feels fine walking around. Dr Sabra Heck aware of patients stats.

## 2015-06-21 NOTE — ED Provider Notes (Signed)
CSN: [id number]     Arrival date & time 06/21/15  2214 History  By signing my name below, I, [name], attest that this documentation has been prepared under the direction and in the presence of [name], MD. Electronically Signed: [name], ED Scribe. 06/21/2015. 10:33 PM.   Chief Complaint  Patient presents with  . Shortness of Breath     The history is provided by the patient. No language interpreter was used.    [name] is a 79 y.o. male with a medical hx of COPD, CVA, who presents to the Emergency Department via EMS complaining of SOB onset 2 days. He reports that he feels like he is unable to breathe and that he is struggling to breathe. He denies wearing oxygen at home. He states that his SOB is not worsened at any specific time of day but that he has to frequently use his breathing treatment and inhaler. He notes that he was seen on 06/11/2015 in the ED and had a CXR, EKG, and was Rx prednisone and guaifenesin-codeine which mildly alleviated his symptoms. Pt is having associated symptoms of productive cough. He notes that he has tried nebs and MDI with temporary relief but has to use it every 3 hours. He denies fever and any other symptoms. Denies PMHx of cardiac issues.   Review of the EMR shows that the pt has had hx of pleural effusion and mass in teh lung of unknown etiology however this was being followed up to this point without reaccumulation of fluids - there was no malignancy on cytology - he has been dx wit RAD and likely element of COPD and is followed by pulmonary per notes.    Per Paramedics, pt was given IV steroids prior to hospital arrival but no neb due to the pt completin his own neb treatment prior to EMS arrival.    Past Medical History  Diagnosis Date  . COPD (chronic obstructive pulmonary disease) ([place])   . Hx-TIA (transient ischemic attack)   . Diabetes mellitus   . Hypertension   . Hyperlipidemia   . Anxiety   . CVA (cerebral vascular  accident) ([place])   . Dementia    Past Surgical History  Procedure Laterality Date  . Rotator cuff repair    . Cholecystectomy N/A 02/23/2013    Procedure: LAPAROSCOPIC CHOLECYSTECTOMY;  Surgeon: Jamesetta So, MD;  Location: [facility];  Service: General;  Laterality: N/A;  . Ercp N/A 02/24/2013    Procedure: ENDOSCOPIC RETROGRADE CHOLANGIOPANCREATOGRAPHY;  Surgeon: Daneil Dolin, MD;  Location: [facility];  Service: Endoscopy;  Laterality: N/A;   Family History  Problem Relation Age of Onset  . Colon cancer Neg Hx   . Hypertension Mother   . Hypertension Father    Social History  Substance Use Topics  . Smoking status: Never Smoker   . Smokeless tobacco: Never Used  . Alcohol Use: No    Review of Systems  All other systems reviewed and are negative.   Allergies  Ace inhibitors  Home Medications   Prior to Admission medications   Medication Sig Start Date End Date Taking? Authorizing Provider  albuterol (PROVENTIL) (2.5 MG/3ML) 0.083% nebulizer solution Take 2.5 mg by nebulization 3 (three) times daily.    Historical Provider, MD  arformoterol (BROVANA) 15 MCG/2ML NEBU Take 2 mLs (15 mcg total) by nebulization 2 (two) times daily. 06/08/15   Collene Gobble, MD  aspirin EC 81 MG tablet Take 81 mg by mouth daily.  Historical Provider, MD  budesonide (PULMICORT) 0.25 MG/2ML nebulizer solution Take 2 mLs (0.25 mg total) by nebulization 2 (two) times daily. Patient not taking: Reported on 06/11/2015 06/08/15   Collene Gobble, MD  donepezil ([name]) 5 MG tablet Take 1 tablet (5 mg total) by mouth at bedtime. 10/07/14   Susy Frizzle, MD  guaiFENesin-codeine 100-10 MG/5ML syrup Take 10 mLs by mouth 3 (three) times daily as needed for cough. 06/11/15   Virgel Manifold, MD  losartan (COZAAR) 100 MG tablet Take 1 tablet (100 mg total) by mouth daily. 02/03/15   Susy Frizzle, MD  losartan (COZAAR) 50 MG tablet TAKE 1 TABLET (50 MG TOTAL) BY MOUTH DAILY. Patient not taking: Reported on  06/11/2015 04/15/15   Susy Frizzle, MD  pioglitazone (ACTOS) 30 MG tablet TAKE 1 TABLET (30 MG TOTAL) BY MOUTH DAILY. 04/18/15   Susy Frizzle, MD  predniSONE (DELTASONE) 20 MG tablet Take 2 tablets (40 mg total) by mouth daily. 06/21/15   [name], MD   BP 126/96 mmHg  Pulse 78  Temp(Src) 98.5 F (36.9 C) (Oral)  Resp 16  Ht 6' (1.829 m)  Wt 215 lb (97.523 kg)  BMI 29.15 kg/m2  SpO2 100% Physical Exam  Constitutional: He is oriented to person, place, and time. He appears well-developed and well-nourished. No distress.  HENT:  Head: Normocephalic and atraumatic.  Right Ear: Hearing normal.  Left Ear: Hearing normal.  Nose: Nose normal.  Mouth/Throat: Oropharynx is clear and moist and mucous membranes are normal.  Eyes: Conjunctivae and EOM are normal. Pupils are equal, round, and reactive to light.  Neck: Normal range of motion. Neck supple.  Cardiovascular: Regular rhythm, S1 normal and S2 normal.  Exam reveals no gallop and no friction rub.   No murmur heard. Pulmonary/Chest: Effort normal. No tachypnea. No respiratory distress. He has wheezes. He exhibits no tenderness.  No increase work of breathing. No tachypnea. Speaks in full sentences. Mild diffuse expiratory wheezes   Abdominal: Soft. Normal appearance and bowel sounds are normal. There is no hepatosplenomegaly. There is no tenderness. There is no rebound, no guarding, no tenderness at McBurney's point and negative Murphy's sign. No hernia.  Musculoskeletal: Normal range of motion.  Neurological: He is alert and oriented to person, place, and time. He has normal strength. No cranial nerve deficit or sensory deficit. Coordination normal. GCS eye subscore is 4. GCS verbal subscore is 5. GCS motor subscore is 6.  Skin: Skin is warm, dry and intact. No rash noted. No cyanosis.  Psychiatric: He has a normal mood and affect. His speech is normal and behavior is normal. Thought content normal.  Nursing note and vitals  reviewed.   ED Course  Procedures (including critical care time) DIAGNOSTIC STUDIES: Oxygen Saturation is 99% on RA, nl by my interpretation.    COORDINATION OF CARE: 10:30 PM Discussed treatment plan with pt at bedside and pt agreed to plan.    Labs Review Labs Reviewed  CBC WITH DIFFERENTIAL/PLATELET - Abnormal; Notable for the following:    HCT 37.9 (*)    Eosinophils Absolute 0.8 (*)    All other components within normal limits  BASIC METABOLIC PANEL - Abnormal; Notable for the following:    Glucose, Bld 233 (*)    Creatinine, Ser 1.28 (*)    GFR calc non Af Amer 52 (*)    All other components within normal limits  TROPONIN I    Imaging Review Dg Chest 2 View  06/21/2015  CLINICAL DATA:  Shortness of breath and wheezing. History of diabetes, COPD, hypertension, dementia. EXAM: CHEST  2 VIEW COMPARISON:  Chest radiograph June 11, 2015 and CT chest March 25, 2015 FINDINGS: Cardiac silhouette is mildly enlarged unchanged. Mildly tortuous calcified aorta. Similar chronic interstitial changes, pleural thickening in the RIGHT lung base with scarring. Rounded airspace opacity projecting in RIGHT lung base. No pneumothorax. Soft tissue planes and included osseous structures are nonsuspicious. IMPRESSION: No active cardiopulmonary disease. Stable rounded airspace opacity in RIGHT lung base, this was previously reported as probable round atelectasis though, close attention is recommended on follow up imaging. Electronically Signed   By: Elon Alas M.D.   On: 06/21/2015 23:34   I have personally reviewed and evaluated these images and lab results as part of my medical decision-making.   EKG Interpretation   Date/Time:  Tuesday June 21 2015 23:06:05 EST Ventricular Rate:  81 PR Interval:  179 QRS Duration: 112 QT Interval:  408 QTC Calculation: 474 R Axis:   23 Text Interpretation:  Sinus rhythm Borderline intraventricular conduction  delay Borderline T wave  abnormalities since last tracing no significant  change Confirmed by Lily Kernen  MD, Malani Lees ([id number]) on 06/21/2015 11:39:44 PM      MDM   Final diagnoses:  COPD exacerbation ([place])    The patient has ongoing shortness of breath, his vital signs are rather unremarkable except for some mild hypertension, no fever, no tachycardia, no hypoxia, ambulates on room air without D satting. He does have diffuse mild expiratory wheezing but this is mild, he does not appear in distress, will give another treatment, chest x-ray to rule out recurrent effusion, patient is agreeable to the plan. When he was seen 12 days ago he had an EKG that had a borderline T-wave abnormality but no other findings., Improved on medications and was sent home in stable and improved condition  Xray viewed and no difference c/o 12 days ago Labs unremarkable Has sat's of 89% on ambulation - 98% at rest but no distress or DOE when ambulates Post neb, pt has less weheezing, and feeling better,  Stable for d/c. Prednisone X 5 d Albuterol F/u with Pulm - already schedled for next 7 days according to pt and family.  Meds given in ED:  Medications  albuterol (PROVENTIL,VENTOLIN) solution continuous neb (10 mg/hr Nebulization New Bag/Given 06/21/15 2324)  ipratropium (ATROVENT) nebulizer solution 0.5 mg (0.5 mg Nebulization Given 06/21/15 2324)    New Prescriptions   PREDNISONE (DELTASONE) 20 MG TABLET    Take 2 tablets (40 mg total) by mouth daily.      I personally performed the services described in this documentation, which was scribed in my presence. The recorded information has been reviewed and is accurate.       [name], MD 06/21/15 [phone]

## 2015-06-24 ENCOUNTER — Encounter: Payer: Self-pay | Admitting: Adult Health

## 2015-06-24 ENCOUNTER — Telehealth: Payer: Self-pay | Admitting: Adult Health

## 2015-06-24 ENCOUNTER — Ambulatory Visit (INDEPENDENT_AMBULATORY_CARE_PROVIDER_SITE_OTHER): Payer: Medicare Other | Admitting: Adult Health

## 2015-06-24 VITALS — BP 148/80 | HR 78 | Ht 72.0 in | Wt 223.0 lb

## 2015-06-24 DIAGNOSIS — J4521 Mild intermittent asthma with (acute) exacerbation: Secondary | ICD-10-CM

## 2015-06-24 MED ORDER — FLUTICASONE-SALMETEROL 250-50 MCG/DOSE IN AEPB: 1.0000 | INHALATION_SPRAY | Freq: Two times a day (BID) | RESPIRATORY_TRACT | Status: DC

## 2015-06-24 NOTE — Patient Instructions (Addendum)
Stop Brovana /Pulmicort .  Restart Advair 250/50 1 puff Twice daily  , rinse after use.  Mucinex DM Twice daily  As needed  Cough/congestion  Finish predniosne taper over next week.  follow up Dr. Lamonte Sakai  In 6- 8 weeks and As needed   Please contact office for sooner follow up if symptoms do not improve or worsen or seek emergency care

## 2015-06-24 NOTE — Assessment & Plan Note (Signed)
Exacerbation -improving with steroids  Needs to get back on controller    Plan  Stop Brovana /Pulmicort .  Restart Advair 250/50 1 puff Twice daily  , rinse after use.  Mucinex DM Twice daily  As needed  Cough/congestion  Finish predniosne taper over next week.  follow up Dr. Lamonte Sakai  In 6- 8 weeks and As needed   Please contact office for sooner follow up if symptoms do not improve or worsen or seek emergency care

## 2015-06-24 NOTE — Progress Notes (Signed)
Subjective:    Patient ID: [name], male    DOB: Oct 16, 1935, 79 y.o.   MRN: [id number]  HPI 79 yo former smoker with Asthma and RLL lung mass  TEST  PET scan 03/2014 low uptake  CT chest 03/2015 RLL w/ slightly smaller.    06/24/2015 ER follow up  Returns for ER follow up . Seen in ER on 12/13 for Ashtma/COPD flare .  Tx/ nebs and given steroid treatment.  Complains of 2 weeks of increased cough and wheezing .  CXR on 12/13 with no acute changes, stable RLL mass .  Out of nebs, cant afford. Was changed last ov from Advair to Brovana and Pulmicort neb  Does not like them as much , feels advair works better.  Can not afford them either.  Patient denies any chest pain, orthopnea, PND, or increased leg swelling.   Past Medical History  Diagnosis Date  . COPD (chronic obstructive pulmonary disease) ([place])   . Hx-TIA (transient ischemic attack)   . Diabetes mellitus   . Hypertension   . Hyperlipidemia   . Anxiety   . CVA (cerebral vascular accident) ([place])   . Dementia    Current Outpatient Prescriptions on File Prior to Visit  Medication Sig Dispense Refill  . albuterol (PROVENTIL) (2.5 MG/3ML) 0.083% nebulizer solution Take 2.5 mg by nebulization 3 (three) times daily.    [name] Kitchen aspirin EC 81 MG tablet Take 81 mg by mouth daily.    [name] Kitchen donepezil ([name]) 5 MG tablet Take 1 tablet (5 mg total) by mouth at bedtime. 30 tablet 5  . losartan (COZAAR) 100 MG tablet Take 1 tablet (100 mg total) by mouth daily. 30 tablet 3  . pioglitazone (ACTOS) 30 MG tablet TAKE 1 TABLET (30 MG TOTAL) BY MOUTH DAILY. 30 tablet 3  . predniSONE (DELTASONE) 20 MG tablet Take 2 tablets (40 mg total) by mouth daily. 10 tablet 0  . arformoterol (BROVANA) 15 MCG/2ML NEBU Take 2 mLs (15 mcg total) by nebulization 2 (two) times daily. (Patient not taking: Reported on 06/24/2015) 120 mL 6  . budesonide (PULMICORT) 0.25 MG/2ML nebulizer solution Take 2 mLs (0.25 mg total) by nebulization 2 (two) times daily.  (Patient not taking: Reported on 06/11/2015) 120 mL 6  . guaiFENesin-codeine 100-10 MG/5ML syrup Take 10 mLs by mouth 3 (three) times daily as needed for cough. (Patient not taking: Reported on 06/24/2015) 120 mL 0   No current facility-administered medications on file prior to visit.      Review of Systems Constitutional:   No  weight loss, night sweats,  Fevers, chills, fatigue, or  lassitude.  HEENT:   No headaches,  Difficulty swallowing,  Tooth/dental problems, or  Sore throat,                No sneezing, itching, ear ache,  +nasal congestion, post nasal drip,   CV:  No chest pain,  Orthopnea, PND, swelling in lower extremities, anasarca, dizziness, palpitations, syncope.   GI  No heartburn, indigestion, abdominal pain, nausea, vomiting, diarrhea, change in bowel habits, loss of appetite, bloody stools.   Resp:  .  No chest wall deformity  Skin: no rash or lesions.  GU: no dysuria, change in color of urine, no urgency or frequency.  No flank pain, no hematuria   MS:  No joint pain or swelling.  No decreased range of motion.  No back pain.  Psych:  No change in mood or affect. No depression or anxiety.  No memory loss.         Objective:   Physical Exam GEN: A/Ox3; pleasant , NAD, elderly   Filed Vitals:   06/24/15 1141  BP: 148/80  Pulse: 78  Height: 6' (1.829 m)  Weight: 223 lb (101.152 kg)  SpO2: 97%     HEENT:  [place]/AT,  EACs-clear, TMs-wnl, NOSE-clear, THROAT-clear, no lesions, no postnasal drip or exudate noted.   NECK:  Supple w/ fair ROM; no JVD; normal carotid impulses w/o bruits; no thyromegaly or nodules palpated; no lymphadenopathy.  RESP  Decreased BS in bases , no accessory muscle use, no dullness to percussion  CARD:  RRR, no m/r/g  , no peripheral edema, pulses intact, no cyanosis or clubbing.  GI:   Soft & nt; nml bowel sounds; no organomegaly or masses detected.  Musco: Warm bil, no deformities or joint swelling noted.   Neuro: alert, no  focal deficits noted.    Skin: Warm, no lesions or rashes         Assessment & Plan:

## 2015-06-24 NOTE — Telephone Encounter (Signed)
RX for advair was sent to [facility]. lmtcb x1 for pt to see what pharmacy he uses

## 2015-06-27 MED ORDER — FLUTICASONE-SALMETEROL 250-50 MCG/DOSE IN AEPB: 1.0000 | INHALATION_SPRAY | Freq: Two times a day (BID) | RESPIRATORY_TRACT | Status: DC

## 2015-06-27 NOTE — Telephone Encounter (Signed)
[facility] uses [facility], pt wife cb,  [phone]

## 2015-06-27 NOTE — Telephone Encounter (Signed)
[facility] [place]; wife aware. Nothing further needed.

## 2015-06-27 NOTE — Telephone Encounter (Signed)
LMTCB x 2  Please verify local pharmacy pt would like RX sent to.

## 2015-06-28 DIAGNOSIS — J45909 Unspecified asthma, uncomplicated: Secondary | ICD-10-CM | POA: Diagnosis not present

## 2015-07-29 DIAGNOSIS — J45909 Unspecified asthma, uncomplicated: Secondary | ICD-10-CM | POA: Diagnosis not present

## 2015-08-18 ENCOUNTER — Encounter: Payer: Self-pay | Admitting: Family Medicine

## 2015-08-18 ENCOUNTER — Ambulatory Visit (INDEPENDENT_AMBULATORY_CARE_PROVIDER_SITE_OTHER): Payer: Medicare Other | Admitting: Family Medicine

## 2015-08-18 VITALS — BP 120/60 | HR 68 | Temp 98.4°F | Resp 14 | Ht 70.0 in | Wt 226.0 lb

## 2015-08-18 DIAGNOSIS — E1129 Type 2 diabetes mellitus with other diabetic kidney complication: Secondary | ICD-10-CM | POA: Diagnosis not present

## 2015-08-18 DIAGNOSIS — Z8673 Personal history of transient ischemic attack (TIA), and cerebral infarction without residual deficits: Secondary | ICD-10-CM

## 2015-08-18 DIAGNOSIS — Z23 Encounter for immunization: Secondary | ICD-10-CM

## 2015-08-18 DIAGNOSIS — E1165 Type 2 diabetes mellitus with hyperglycemia: Secondary | ICD-10-CM | POA: Diagnosis not present

## 2015-08-18 DIAGNOSIS — F0390 Unspecified dementia without behavioral disturbance: Secondary | ICD-10-CM | POA: Diagnosis not present

## 2015-08-18 DIAGNOSIS — I10 Essential (primary) hypertension: Secondary | ICD-10-CM

## 2015-08-18 DIAGNOSIS — R809 Proteinuria, unspecified: Secondary | ICD-10-CM | POA: Diagnosis not present

## 2015-08-18 LAB — CBC WITH DIFFERENTIAL/PLATELET
Basophils Absolute: 0.1 10*3/uL (ref 0.0–0.1)
Basophils Relative: 1 % (ref 0–1)
EOS PCT: 5 % (ref 0–5)
Eosinophils Absolute: 0.4 10*3/uL (ref 0.0–0.7)
HEMATOCRIT: 37.6 % — AB (ref 39.0–52.0)
Hemoglobin: 12.9 g/dL — ABNORMAL LOW (ref 13.0–17.0)
Lymphocytes Relative: 23 % (ref 12–46)
Lymphs Abs: 1.7 10*3/uL (ref 0.7–4.0)
MCH: 31 pg (ref 26.0–34.0)
MCHC: 34.3 g/dL (ref 30.0–36.0)
MCV: 90.4 fL (ref 78.0–100.0)
MONO ABS: 0.5 10*3/uL (ref 0.1–1.0)
MONOS PCT: 7 % (ref 3–12)
MPV: 8.9 fL (ref 8.6–12.4)
Neutro Abs: 4.9 10*3/uL (ref 1.7–7.7)
Neutrophils Relative %: 64 % (ref 43–77)
Platelets: 208 10*3/uL (ref 150–400)
RBC: 4.16 MIL/uL — AB (ref 4.22–5.81)
RDW: 14.6 % (ref 11.5–15.5)
WBC: 7.6 10*3/uL (ref 4.0–10.5)

## 2015-08-18 LAB — LIPID PANEL
Cholesterol: 159 mg/dL (ref 125–200)
HDL: 31 mg/dL — AB (ref >=40)
LDL CALC: 97 mg/dL (ref <130)
TRIGLYCERIDES: 154 mg/dL — AB (ref <150)
Total CHOL/HDL Ratio: 5.1 Ratio — ABNORMAL HIGH (ref <=5.0)
VLDL: 31 mg/dL — AB (ref <30)

## 2015-08-18 LAB — COMPLETE METABOLIC PANEL WITH GFR
ALBUMIN: 4 g/dL (ref 3.6–5.1)
ALK PHOS: 64 U/L (ref 40–115)
ALT: 9 U/L (ref 9–46)
AST: 12 U/L (ref 10–35)
BUN: 18 mg/dL (ref 7–25)
CALCIUM: 8.9 mg/dL (ref 8.6–10.3)
CHLORIDE: 107 mmol/L (ref 98–110)
CO2: 20 mmol/L (ref 20–31)
CREATININE: 1.35 mg/dL — AB (ref 0.70–1.18)
GFR, Est African American: 58 mL/min — ABNORMAL LOW (ref >=60)
GFR, Est Non African American: 50 mL/min — ABNORMAL LOW (ref >=60)
GLUCOSE: 173 mg/dL — AB (ref 70–99)
Potassium: 4.1 mmol/L (ref 3.5–5.3)
SODIUM: 141 mmol/L (ref 135–146)
Total Bilirubin: 0.7 mg/dL (ref 0.2–1.2)
Total Protein: 7.2 g/dL (ref 6.1–8.1)

## 2015-08-18 NOTE — Progress Notes (Signed)
Subjective:    Patient ID: [name], male    DOB: [birth year], 80 y.o.   MRN: [id number]  HPI 02/03/15 Patient is a 80 year old African-American male here today for follow-up of his diabetes mellitus. He has a history of stroke. He also has a history of hypertension. His blood pressure today is well controlled at 132/68. He denies any chest pain or shortness of breath. He recently had an asthma attack requiring EMS but he attributed this to the heat outside. This shortness of breath improved immediately with albuterol. Otherwise he has been doing well. He reports fasting blood sugars between 101 120. He reports two-hour postprandial sugars less than 150. However I doubt the patient is checking his sugar very often. He denies any hypoglycemic episodes. He denies any bleeding or bruising on aspirin.  At that time, my plan was: Regarding the patient's diabetes, I will check a hemoglobin A1c as well as a urine microalbumin. Goal hemoglobin A1c is less than 6.5. Patient's blood pressure is adequately controlled. I did suggest the patient take losartan 100 mg by mouth daily rather than 50 mg a day to maximize his renal protection. I would like the patient to return fasting for fasting lipid panel. Given his history of stroke, his goal LDL cholesterol is less than 70.  08/18/15  patient is overdue for diabetic eye exam. He is also due for Prevnar 13. Pneumovax 23 is up-to-date. He states that his fasting blood sugars and two-hour postprandial sugars are ranging between 100-150 however I doubt the validity of these numbers as the patient appears confused and given his dementia I do not believe that these are accurate. I'm not even sure if he is checking his blood sugar. He denies any hypoglycemia.   He denies any polyuria, polydipsia, or blurred vision. He denies any chest pain shortness of breath or dyspnea on exertion. He denies any myalgias or right upper quadrant pain. Diabetic foot exam is performed today  and shows significant hyperkeratosis on both feet but no ulcers. He has palpable pulses in both feet but it is weak. He also has normal sensation to 10 g monofilament. Past Medical History  Diagnosis Date  . COPD (chronic obstructive pulmonary disease) ([place])   . Hx-TIA (transient ischemic attack)   . Diabetes mellitus   . Hypertension   . Hyperlipidemia   . Anxiety   . CVA (cerebral vascular accident) ([place])   . Dementia    Past Surgical History  Procedure Laterality Date  . Rotator cuff repair    . Cholecystectomy N/A 02/23/2013    Procedure: LAPAROSCOPIC CHOLECYSTECTOMY;  Surgeon: Jamesetta So, MD;  Location: [facility];  Service: General;  Laterality: N/A;  . Ercp N/A 02/24/2013    Procedure: ENDOSCOPIC RETROGRADE CHOLANGIOPANCREATOGRAPHY;  Surgeon: Daneil Dolin, MD;  Location: [facility];  Service: Endoscopy;  Laterality: N/A;   Current Outpatient Prescriptions on File Prior to Visit  Medication Sig Dispense Refill  . albuterol (PROVENTIL) (2.5 MG/3ML) 0.083% nebulizer solution Take 2.5 mg by nebulization 3 (three) times daily.    [name] Kitchen aspirin EC 81 MG tablet Take 81 mg by mouth daily.    [name] Kitchen donepezil ([name]) 5 MG tablet Take 1 tablet (5 mg total) by mouth at bedtime. 30 tablet 5  . Fluticasone-Salmeterol (ADVAIR) 250-50 MCG/DOSE AEPB Inhale 1 puff into the lungs every 12 (twelve) hours. 60 each 11  . guaiFENesin-codeine 100-10 MG/5ML syrup Take 10 mLs by mouth 3 (three) times daily as needed for cough. (Patient  not taking: Reported on 06/24/2015) 120 mL 0  . losartan (COZAAR) 100 MG tablet Take 1 tablet (100 mg total) by mouth daily. 30 tablet 3  . pioglitazone (ACTOS) 30 MG tablet TAKE 1 TABLET (30 MG TOTAL) BY MOUTH DAILY. 30 tablet 3  . predniSONE (DELTASONE) 20 MG tablet Take 2 tablets (40 mg total) by mouth daily. 10 tablet 0   No current facility-administered medications on file prior to visit.   Allergies  Allergen Reactions  . Ace Inhibitors Swelling   Social History    Social History  . Marital Status: Married    Spouse Name: [name]  . Number of Children: 6  . Years of Education: 11th   Occupational History  . Retired    Social History Main Topics  . Smoking status: Never Smoker   . Smokeless tobacco: Never Used  . Alcohol Use: No  . Drug Use: No  . Sexual Activity: Not on file   Other Topics Concern  . Not on file   Social History Narrative   Patient lives at home with family.   Caffeine Use: 1 cup of coffee      Review of Systems  All other systems reviewed and are negative.      Objective:   Physical Exam  Constitutional: He appears well-developed and well-nourished.  Neck: Neck supple. No thyromegaly present.  Cardiovascular: Normal rate, regular rhythm, normal heart sounds and intact distal pulses.   No murmur heard. Pulmonary/Chest: Effort normal and breath sounds normal. No respiratory distress. He has no wheezes. He has no rales.  Abdominal: Soft. Bowel sounds are normal.  Musculoskeletal: He exhibits no edema.  Lymphadenopathy:    He has no cervical adenopathy.  Vitals reviewed.         Assessment & Plan:  Uncontrolled type 2 diabetes mellitus with microalbuminuria, unspecified long term insulin use status ([place]) - Plan: CBC with Differential/Platelet, COMPLETE METABOLIC PANEL WITH GFR, Hemoglobin A1c, Lipid panel, Microalbumin, urine, Ambulatory referral to Ophthalmology, Ambulatory referral to [facility]  Dementia, without behavioral disturbance - Plan: Ambulatory referral to [facility]  History of CVA (cerebrovascular accident)  Essential hypertension, benign  Need for prophylactic vaccination against Streptococcus pneumoniae (pneumococcus) - Plan: Pneumococcal conjugate vaccine 13-valent IM   blood pressure is excellent. I will make no changes in his blood pressure medication. Patient is compliant taking his aspirin which he is using for secondary prevention of stroke. I will check a fasting lipid panel.  Goal LDL cholesterol is less than 70 given his history of stroke. I will also check a hemoglobin A1c with a goal hemoglobin A1c of 6.5. I will also check a urine microalbumin as it has been elevated in the past. I will schedule the patient for diabetic eye exam. He also received Prevnar 13 today in office. Given his dementia , and abnormalities on his foot exam, I recommended home health come out and evaluate the patient's home living situation and see if he is available for any assistance around the home with activities of daily living.

## 2015-08-19 LAB — HEMOGLOBIN A1C
HEMOGLOBIN A1C: 8.5 % — AB (ref <5.7)
Mean Plasma Glucose: 197 mg/dL — ABNORMAL HIGH (ref <117)

## 2015-08-23 ENCOUNTER — Other Ambulatory Visit: Payer: Self-pay | Admitting: Family Medicine

## 2015-08-23 ENCOUNTER — Telehealth: Payer: Self-pay | Admitting: Family Medicine

## 2015-08-23 MED ORDER — LINAGLIPTIN 5 MG PO TABS: 5.0000 mg | ORAL_TABLET | Freq: Every day | ORAL | Status: DC

## 2015-08-23 MED ORDER — LINAGLIPTIN 5 MG PO TABS
5.0000 mg | ORAL_TABLET | Freq: Every day | ORAL | Status: DC
Start: 2015-08-23 — End: 2016-04-23

## 2015-08-23 MED ORDER — LINAGLIPTIN 5 MG PO TABS
5.0000 mg | ORAL_TABLET | Freq: Every day | ORAL | Status: DC
Start: 2015-08-23 — End: 2015-08-23

## 2015-08-23 NOTE — Telephone Encounter (Signed)
Wife states she needs letter about Mr Falcon and his conditions.  Impacting their finances so she can give to [employer].  Said she had mentioned this at last OV

## 2015-08-23 NOTE — Telephone Encounter (Signed)
Medication called/sent to requested pharmacy  

## 2015-08-23 NOTE — Addendum Note (Signed)
Addended by: Shary Decamp B on: 08/23/2015 03:58 PM   Modules accepted: Orders

## 2015-08-23 NOTE — Telephone Encounter (Signed)
She wasn't at ov.  Son asked for home health referral which I placed.  His medical problems will not help with electricity UNLESS he is on oxygen concentrator, etc that requires electricity not to be cut off.

## 2015-08-23 NOTE — Telephone Encounter (Signed)
Patient's wife called in requesting that we send his  tradjenta 5 mg to [facility] she states that the church is going to help pay for this medication.  CB# [phone]

## 2015-08-26 ENCOUNTER — Telehealth: Payer: Self-pay | Admitting: Family Medicine

## 2015-08-26 DIAGNOSIS — I10 Essential (primary) hypertension: Secondary | ICD-10-CM | POA: Diagnosis not present

## 2015-08-26 DIAGNOSIS — E119 Type 2 diabetes mellitus without complications: Secondary | ICD-10-CM | POA: Diagnosis not present

## 2015-08-26 DIAGNOSIS — J449 Chronic obstructive pulmonary disease, unspecified: Secondary | ICD-10-CM | POA: Diagnosis not present

## 2015-08-26 NOTE — Telephone Encounter (Signed)
[facility]  Nurse calling to say they have accepted him.  Will be seeing him for next few weeks to get him under good control with care and medications.  Wants OK to call in Social worker to assist with any personal or community needs (OK given).  Reviewed medication list.  We have pt taking Losartan 100 mg but [facility] says they only have 50 mg Losartan in house (she is going to check to see if has been taking 1 or 2)  Also wanted OK to contact Dr Malvin [facility]) for any resp needs.  (OK given for that as well).

## 2015-08-29 DIAGNOSIS — J449 Chronic obstructive pulmonary disease, unspecified: Secondary | ICD-10-CM | POA: Diagnosis not present

## 2015-08-29 DIAGNOSIS — J45909 Unspecified asthma, uncomplicated: Secondary | ICD-10-CM | POA: Diagnosis not present

## 2015-08-29 DIAGNOSIS — E119 Type 2 diabetes mellitus without complications: Secondary | ICD-10-CM | POA: Diagnosis not present

## 2015-08-29 DIAGNOSIS — I10 Essential (primary) hypertension: Secondary | ICD-10-CM | POA: Diagnosis not present

## 2015-09-01 ENCOUNTER — Emergency Department (HOSPITAL_COMMUNITY)
Admission: EM | Admit: 2015-09-01 | Discharge: 2015-09-01 | Disposition: A | Discharge status: Home / Self Care | Payer: Medicare Other | Attending: Emergency Medicine | Admitting: Emergency Medicine

## 2015-09-01 ENCOUNTER — Telehealth: Payer: Self-pay | Admitting: Family Medicine

## 2015-09-01 ENCOUNTER — Emergency Department (HOSPITAL_COMMUNITY): Payer: Medicare Other

## 2015-09-01 ENCOUNTER — Encounter (HOSPITAL_COMMUNITY): Payer: Self-pay | Admitting: Emergency Medicine

## 2015-09-01 DIAGNOSIS — N189 Chronic kidney disease, unspecified: Secondary | ICD-10-CM | POA: Diagnosis not present

## 2015-09-01 DIAGNOSIS — Z79899 Other long term (current) drug therapy: Secondary | ICD-10-CM | POA: Diagnosis not present

## 2015-09-01 DIAGNOSIS — D649 Anemia, unspecified: Secondary | ICD-10-CM | POA: Insufficient documentation

## 2015-09-01 DIAGNOSIS — Z7951 Long term (current) use of inhaled steroids: Secondary | ICD-10-CM | POA: Insufficient documentation

## 2015-09-01 DIAGNOSIS — R05 Cough: Secondary | ICD-10-CM | POA: Diagnosis not present

## 2015-09-01 DIAGNOSIS — J111 Influenza due to unidentified influenza virus with other respiratory manifestations: Secondary | ICD-10-CM | POA: Diagnosis not present

## 2015-09-01 DIAGNOSIS — F0390 Unspecified dementia without behavioral disturbance: Secondary | ICD-10-CM | POA: Diagnosis not present

## 2015-09-01 DIAGNOSIS — I129 Hypertensive chronic kidney disease with stage 1 through stage 4 chronic kidney disease, or unspecified chronic kidney disease: Secondary | ICD-10-CM | POA: Insufficient documentation

## 2015-09-01 DIAGNOSIS — R0602 Shortness of breath: Secondary | ICD-10-CM | POA: Diagnosis not present

## 2015-09-01 DIAGNOSIS — Z8673 Personal history of transient ischemic attack (TIA), and cerebral infarction without residual deficits: Secondary | ICD-10-CM | POA: Diagnosis not present

## 2015-09-01 DIAGNOSIS — E119 Type 2 diabetes mellitus without complications: Secondary | ICD-10-CM | POA: Diagnosis not present

## 2015-09-01 DIAGNOSIS — F419 Anxiety disorder, unspecified: Secondary | ICD-10-CM | POA: Diagnosis not present

## 2015-09-01 DIAGNOSIS — Z7984 Long term (current) use of oral hypoglycemic drugs: Secondary | ICD-10-CM | POA: Diagnosis not present

## 2015-09-01 DIAGNOSIS — Z7982 Long term (current) use of aspirin: Secondary | ICD-10-CM | POA: Diagnosis not present

## 2015-09-01 DIAGNOSIS — J441 Chronic obstructive pulmonary disease with (acute) exacerbation: Secondary | ICD-10-CM | POA: Insufficient documentation

## 2015-09-01 DIAGNOSIS — R69 Illness, unspecified: Secondary | ICD-10-CM

## 2015-09-01 LAB — CBC WITH DIFFERENTIAL/PLATELET
Basophils Absolute: 0 10*3/uL (ref 0.0–0.1)
Basophils Relative: 0 %
Eosinophils Absolute: 0.2 10*3/uL (ref 0.0–0.7)
Eosinophils Relative: 4 %
HEMATOCRIT: 33.6 % — AB (ref 39.0–52.0)
HEMOGLOBIN: 11.5 g/dL — AB (ref 13.0–17.0)
LYMPHS ABS: 0.9 10*3/uL (ref 0.7–4.0)
LYMPHS PCT: 15 %
MCH: 30.7 pg (ref 26.0–34.0)
MCHC: 34.2 g/dL (ref 30.0–36.0)
MCV: 89.8 fL (ref 78.0–100.0)
MONOS PCT: 10 %
Monocytes Absolute: 0.6 10*3/uL (ref 0.1–1.0)
NEUTROS ABS: 4.5 10*3/uL (ref 1.7–7.7)
NEUTROS PCT: 71 %
Platelets: 191 10*3/uL (ref 150–400)
RBC: 3.74 MIL/uL — ABNORMAL LOW (ref 4.22–5.81)
RDW: 13 % (ref 11.5–15.5)
WBC: 6.3 10*3/uL (ref 4.0–10.5)

## 2015-09-01 LAB — COMPREHENSIVE METABOLIC PANEL
ALK PHOS: 57 U/L (ref 38–126)
ALT: 13 U/L — ABNORMAL LOW (ref 17–63)
ANION GAP: 10 (ref 5–15)
AST: 15 U/L (ref 15–41)
Albumin: 3.8 g/dL (ref 3.5–5.0)
BILIRUBIN TOTAL: 0.9 mg/dL (ref 0.3–1.2)
BUN: 18 mg/dL (ref 6–20)
CO2: 21 mmol/L — AB (ref 22–32)
Calcium: 8.4 mg/dL — ABNORMAL LOW (ref 8.9–10.3)
Chloride: 105 mmol/L (ref 101–111)
Creatinine, Ser: 1.31 mg/dL — ABNORMAL HIGH (ref 0.61–1.24)
GFR calc non Af Amer: 51 mL/min — ABNORMAL LOW (ref >60)
GFR, EST AFRICAN AMERICAN: 59 mL/min — AB (ref >60)
GLUCOSE: 133 mg/dL — AB (ref 65–99)
Potassium: 3.8 mmol/L (ref 3.5–5.1)
Sodium: 136 mmol/L (ref 135–145)
Total Protein: 7.3 g/dL (ref 6.5–8.1)

## 2015-09-01 MED ORDER — ALBUTEROL SULFATE HFA 108 (90 BASE) MCG/ACT IN AERS
2.0000 | INHALATION_SPRAY | RESPIRATORY_TRACT | Status: DC | PRN
  Administered 2015-09-01: 2 via RESPIRATORY_TRACT
  Filled 2015-09-01: qty 6.7

## 2015-09-01 MED ORDER — ALBUTEROL SULFATE (2.5 MG/3ML) 0.083% IN NEBU
5.0000 mg | INHALATION_SOLUTION | Freq: Once | RESPIRATORY_TRACT | Status: DC
Start: 2015-09-01 — End: 2015-09-01
  Filled 2015-09-01: qty 6

## 2015-09-01 MED ORDER — ACETAMINOPHEN 500 MG PO TABS
1000.0000 mg | ORAL_TABLET | Freq: Once | ORAL | Status: DC
  Filled 2015-09-01: qty 2

## 2015-09-01 MED ORDER — ACETAMINOPHEN 500 MG PO TABS
ORAL_TABLET | ORAL | Status: AC
  Filled 2015-09-01: qty 1

## 2015-09-01 MED ORDER — AEROCHAMBER PLUS W/MASK MISC
1.0000 | Freq: Once | Status: DC
  Filled 2015-09-01: qty 1

## 2015-09-01 MED ORDER — IPRATROPIUM-ALBUTEROL 0.5-2.5 (3) MG/3ML IN SOLN
3.0000 mL | Freq: Once | RESPIRATORY_TRACT | Status: AC
  Administered 2015-09-01: 3 mL via RESPIRATORY_TRACT
  Filled 2015-09-01: qty 3

## 2015-09-01 MED ORDER — ALBUTEROL SULFATE (2.5 MG/3ML) 0.083% IN NEBU
2.5000 mg | INHALATION_SOLUTION | Freq: Once | RESPIRATORY_TRACT | Status: AC
  Administered 2015-09-01: 2.5 mg via RESPIRATORY_TRACT

## 2015-09-01 MED ORDER — LOSARTAN POTASSIUM 100 MG PO TABS: 100.0000 mg | ORAL_TABLET | Freq: Every day | ORAL | Status: DC

## 2015-09-01 MED ORDER — ACETAMINOPHEN 500 MG PO TABS
ORAL_TABLET | ORAL | Status: DC
Start: 2015-09-01 — End: 2015-09-02
  Filled 2015-09-01: qty 2

## 2015-09-01 NOTE — Telephone Encounter (Signed)
Pt's wife called and said that the Losartan RX that they picked up was 50 mg tablets instead of 100 mg. She has been giving him the 50 mg 2x a day. She would like to know if we can call in a prescription of the [facility] mg tabs [facility] [place]

## 2015-09-01 NOTE — ED Provider Notes (Signed)
CSN: [id number]     Arrival date & time 09/01/15  1726 History   First MD Initiated Contact with Patient 09/01/15 1834     Chief Complaint  Patient presents with  . Shortness of Breath     (Consider location/radiation/quality/duration/timing/severity/associated sxs/prior Treatment) HPI 5 caveat dementia history is obtained from patient and from patient's wife. Patient complains of cough productive of yellow sputum and shortness of breath onset yesterday. No known fever. No treatment prior to coming here no other associated symptoms. No vomiting or nausea. He states presently that "I feel pretty good". Nothing makes symptoms better or worse. Past Medical History  Diagnosis Date  . COPD (chronic obstructive pulmonary disease) ([place])   . Hx-TIA (transient ischemic attack)   . Diabetes mellitus   . Hypertension   . Hyperlipidemia   . Anxiety   . CVA (cerebral vascular accident) ([place])   . Dementia    Past Surgical History  Procedure Laterality Date  . Rotator cuff repair    . Cholecystectomy N/A 02/23/2013    Procedure: LAPAROSCOPIC CHOLECYSTECTOMY;  Surgeon: Jamesetta So, MD;  Location: [facility];  Service: General;  Laterality: N/A;  . Ercp N/A 02/24/2013    Procedure: ENDOSCOPIC RETROGRADE CHOLANGIOPANCREATOGRAPHY;  Surgeon: Daneil Dolin, MD;  Location: [facility];  Service: Endoscopy;  Laterality: N/A;   Family History  Problem Relation Age of Onset  . Colon cancer Neg Hx   . Hypertension Mother   . Hypertension Father    Social History  Substance Use Topics  . Smoking status: Never Smoker   . Smokeless tobacco: Never Used  . Alcohol Use: No    Review of Systems  Unable to perform ROS: Dementia  Respiratory: Positive for cough and shortness of breath.   Allergic/Immunologic: Positive for immunocompromised state.       Diabetic      Allergies  Ace inhibitors  Home Medications   Prior to Admission medications   Medication Sig Start Date End Date Taking? Authorizing  Provider  albuterol (PROVENTIL) (2.5 MG/3ML) 0.083% nebulizer solution Take 2.5 mg by nebulization 3 (three) times daily.    Historical Provider, MD  aspirin EC 81 MG tablet Take 81 mg by mouth daily.    Historical Provider, MD  donepezil ([name]) 5 MG tablet Take 1 tablet (5 mg total) by mouth at bedtime. 10/07/14   Susy Frizzle, MD  Fluticasone-Salmeterol (ADVAIR) 250-50 MCG/DOSE AEPB Inhale 1 puff into the lungs every 12 (twelve) hours. 06/27/15   Tammy S Parrett, NP  linagliptin (TRADJENTA) 5 MG TABS tablet Take 1 tablet (5 mg total) by mouth daily. 08/23/15   Susy Frizzle, MD  losartan (COZAAR) 100 MG tablet Take 1 tablet (100 mg total) by mouth daily. 09/01/15   Susy Frizzle, MD  pioglitazone (ACTOS) 30 MG tablet TAKE 1 TABLET (30 MG TOTAL) BY MOUTH DAILY. 04/18/15   Susy Frizzle, MD   BP 143/62 mmHg  Pulse 89  Temp(Src) 101.2 F (38.4 C) (Temporal)  Resp 20  Ht 6' (1.829 m)  Wt 215 lb (97.523 kg)  BMI 29.15 kg/m2  SpO2 97% Physical Exam  Constitutional: He appears well-developed and well-nourished. No distress.  HENT:  Head: Normocephalic and atraumatic.  Eyes: Conjunctivae are normal. Pupils are equal, round, and reactive to light.  Neck: Neck supple. No tracheal deviation present. No thyromegaly present.  Cardiovascular: Normal rate and regular rhythm.   No murmur heard. Pulmonary/Chest: Effort normal and breath sounds normal.  Abdominal: Soft. Bowel  sounds are normal. He exhibits no distension. There is no tenderness.  Musculoskeletal: Normal range of motion. He exhibits no edema or tenderness.  Neurological: He is alert. Coordination normal.  Skin: Skin is warm and dry. No rash noted.  Psychiatric: He has a normal mood and affect.  Nursing note and vitals reviewed.   ED Course  Procedures (including critical care time) Labs Review Labs Reviewed  CBC WITH DIFFERENTIAL/PLATELET  COMPREHENSIVE METABOLIC PANEL    Imaging Review No results found. I  have personally reviewed and evaluated these images and lab results as part of my medical decision-making.   EKG Interpretation   Date/Time:  Thursday September 01 2015 17:43:02 EST Ventricular Rate:  84 PR Interval:  160 QRS Duration: 94 QT Interval:  374 QTC Calculation: 441 R Axis:   11 Text Interpretation:  Normal sinus rhythm Nonspecific T wave abnormality  Abnormal ECG No significant change since last tracing Confirmed by  Winfred Leeds  MD, Evangeline Utley [phone]) on 09/01/2015 6:59:17 PM     After treatment with DuoNeb nebulizer treatment and Tylenol patient states "I feel good". He feels ready to go home and is not ill-appearing Chest x-ray viewed by me. Results for orders placed or performed during the hospital encounter of 09/01/15  CBC with Differential  Result Value Ref Range   WBC 6.3 4.0 - 10.5 K/uL   RBC 3.74 (L) 4.22 - 5.81 MIL/uL   Hemoglobin 11.5 (L) 13.0 - 17.0 g/dL   HCT 33.6 (L) 39.0 - 52.0 %   MCV 89.8 78.0 - 100.0 fL   MCH 30.7 26.0 - 34.0 pg   MCHC 34.2 30.0 - 36.0 g/dL   RDW 13.0 11.5 - 15.5 %   Platelets 191 150 - 400 K/uL   Neutrophils Relative % 71 %   Neutro Abs 4.5 1.7 - 7.7 K/uL   Lymphocytes Relative 15 %   Lymphs Abs 0.9 0.7 - 4.0 K/uL   Monocytes Relative 10 %   Monocytes Absolute 0.6 0.1 - 1.0 K/uL   Eosinophils Relative 4 %   Eosinophils Absolute 0.2 0.0 - 0.7 K/uL   Basophils Relative 0 %   Basophils Absolute 0.0 0.0 - 0.1 K/uL  Comprehensive metabolic panel  Result Value Ref Range   Sodium 136 135 - 145 mmol/L   Potassium 3.8 3.5 - 5.1 mmol/L   Chloride 105 101 - 111 mmol/L   CO2 21 (L) 22 - 32 mmol/L   Glucose, Bld 133 (H) 65 - 99 mg/dL   BUN 18 6 - 20 mg/dL   Creatinine, Ser 1.31 (H) 0.61 - 1.24 mg/dL   Calcium 8.4 (L) 8.9 - 10.3 mg/dL   Total Protein 7.3 6.5 - 8.1 g/dL   Albumin 3.8 3.5 - 5.0 g/dL   AST 15 15 - 41 U/L   ALT 13 (L) 17 - 63 U/L   Alkaline Phosphatase 57 38 - 126 U/L   Total Bilirubin 0.9 0.3 - 1.2 mg/dL   GFR calc non Af  Amer 51 (L) >60 mL/min   GFR calc Af Amer 59 (L) >60 mL/min   Anion gap 10 5 - 15   Dg Chest 2 View  09/01/2015  CLINICAL DATA:  Shortness of breath and nonproductive cough for 3 days. EXAM: CHEST  2 VIEW COMPARISON:  06/21/2015.  CT scan 03/25/2015. FINDINGS: Hyperexpansion is consistent with emphysema. Right base opacity with blunting of right costophrenic angle is stable, and density projecting over the lower thoracic spine on the lateral film is unchanged.  No pulmonary edema. The cardio pericardial silhouette is enlarged. The visualized bony structures of the thorax are intact. Telemetry leads overlie the chest. IMPRESSION: Stable exam. Chronic changes in the right base with round density over the posterior right lower lobe seen on previous CT and characterize at that time as rounded atelectasis. Electronically Signed   By: Misty Stanley M.D.   On: 09/01/2015 19:38    MDM  Pt noted to be mildly anemic. He denies seeing any blood per rectum or black stools. Cough and fevwer c/w influenza like illness. Renal insufficiecny is chronic Final diagnoses:  None  Plan : albuterol hfa with spacer to go ,to use  2 puffs q 4 hour prn sob or cough , tylenol for fever, See Pmd, Dr Dennard Schaumann to get hgb rechecked 1-2 weeks Dx #1 influenza like illness #2 anemia #3chronic renal insufficency       Orlie Dakin, MD 09/01/15 2204

## 2015-09-01 NOTE — Telephone Encounter (Signed)
Medication called/sent to requested pharmacy  

## 2015-09-01 NOTE — Telephone Encounter (Signed)
[facility] called in to help patient with social needs

## 2015-09-01 NOTE — ED Notes (Signed)
Pt reports hx of COPD, has had increasing SOB that started 3 days ago.

## 2015-09-01 NOTE — Discharge Instructions (Signed)
Use your albuterol inhaler with spacer 2 puffs every 4 hours as needed for cough or shortness of breath. You can take Tylenol as directed every 4 hours as needed for temperature higher than 100.4 or for aches while awake. Your blood count (hemoglobin) is slightly low tonight at 11.5. This is not require a blood transfusion or further treatment, however it should be rechecked by Dr. Dennard Newman within the next 2 weeks at his office. Return if you feel worse for any reason or see Dr. Dennard Newman in his office.

## 2015-09-02 DIAGNOSIS — E119 Type 2 diabetes mellitus without complications: Secondary | ICD-10-CM | POA: Diagnosis not present

## 2015-09-02 DIAGNOSIS — I10 Essential (primary) hypertension: Secondary | ICD-10-CM | POA: Diagnosis not present

## 2015-09-02 DIAGNOSIS — J449 Chronic obstructive pulmonary disease, unspecified: Secondary | ICD-10-CM | POA: Diagnosis not present

## 2015-09-07 DIAGNOSIS — J449 Chronic obstructive pulmonary disease, unspecified: Secondary | ICD-10-CM | POA: Diagnosis not present

## 2015-09-07 DIAGNOSIS — E119 Type 2 diabetes mellitus without complications: Secondary | ICD-10-CM | POA: Diagnosis not present

## 2015-09-07 DIAGNOSIS — I10 Essential (primary) hypertension: Secondary | ICD-10-CM | POA: Diagnosis not present

## 2015-09-08 ENCOUNTER — Encounter: Payer: Self-pay | Admitting: Family Medicine

## 2015-09-08 ENCOUNTER — Ambulatory Visit (INDEPENDENT_AMBULATORY_CARE_PROVIDER_SITE_OTHER): Payer: Medicare Other | Admitting: Family Medicine

## 2015-09-08 VITALS — BP 126/68 | HR 80 | Temp 98.2°F | Resp 16 | Ht 70.0 in | Wt 227.0 lb

## 2015-09-08 DIAGNOSIS — D538 Other specified nutritional anemias: Secondary | ICD-10-CM | POA: Diagnosis not present

## 2015-09-08 DIAGNOSIS — R5383 Other fatigue: Secondary | ICD-10-CM | POA: Diagnosis not present

## 2015-09-08 LAB — ANEMIA PANEL
%SAT: 30 % (ref 15–60)
ABS Retic: 98 10*3/uL (ref 19.0–186.0)
FERRITIN: 769 ng/mL — AB (ref 20–380)
FOLATE: 9 ng/mL (ref >5.4)
Iron: 69 ug/dL (ref 50–180)
RBC.: 3.92 MIL/uL — AB (ref 4.22–5.81)
Retic Ct Pct: 2.5 % — ABNORMAL HIGH (ref 0.4–2.3)
TIBC: 229 ug/dL — AB (ref 250–425)
UIBC: 160 ug/dL (ref 125–400)
VITAMIN B 12: 285 pg/mL (ref 200–1100)

## 2015-09-08 NOTE — Progress Notes (Signed)
Subjective:    Patient ID: [name], male    DOB: 1935-12-12, 80 y.o.   MRN: [id number]  HPI Was recently seen at the emergency department for a viral flulike illness. At that time there was a coincidental finding of a drop in his hemoglobin. 7 months ago he was greater than 13. When I saw him in early February he was essentially 13. He is now dropped near 11. He denies any black tarry stools or blood in his stools. He denies any epistaxis or hematuria. He denies any bleeding or bruising but he does report fatigue and lack of energy. Last colonoscopy was in 2006. He is overdue for this. Past Medical History  Diagnosis Date  . COPD (chronic obstructive pulmonary disease) ([place])   . Hx-TIA (transient ischemic attack)   . Diabetes mellitus   . Hypertension   . Hyperlipidemia   . Anxiety   . CVA (cerebral vascular accident) ([place])   . Dementia    Past Surgical History  Procedure Laterality Date  . Rotator cuff repair    . Cholecystectomy N/A 02/23/2013    Procedure: LAPAROSCOPIC CHOLECYSTECTOMY;  Surgeon: Jamesetta So, MD;  Location: [facility];  Service: General;  Laterality: N/A;  . Ercp N/A 02/24/2013    Procedure: ENDOSCOPIC RETROGRADE CHOLANGIOPANCREATOGRAPHY;  Surgeon: Daneil Dolin, MD;  Location: [facility];  Service: Endoscopy;  Laterality: N/A;   Current Outpatient Prescriptions on File Prior to Visit  Medication Sig Dispense Refill  . albuterol (PROVENTIL) (2.5 MG/3ML) 0.083% nebulizer solution Take 2.5 mg by nebulization 3 (three) times daily.    [name] Kitchen aspirin EC 81 MG tablet Take 81 mg by mouth daily.    [name] Kitchen donepezil ([name]) 5 MG tablet Take 1 tablet (5 mg total) by mouth at bedtime. 30 tablet 5  . Fluticasone-Salmeterol (ADVAIR) 250-50 MCG/DOSE AEPB Inhale 1 puff into the lungs every 12 (twelve) hours. 60 each 11  . linagliptin (TRADJENTA) 5 MG TABS tablet Take 1 tablet (5 mg total) by mouth daily. 30 tablet 2  . losartan (COZAAR) 100 MG tablet Take 1 tablet (100 mg total) by  mouth daily. 30 tablet 3  . pioglitazone (ACTOS) 30 MG tablet TAKE 1 TABLET (30 MG TOTAL) BY MOUTH DAILY. 30 tablet 3   No current facility-administered medications on file prior to visit.   Allergies  Allergen Reactions  . Ace Inhibitors Swelling   Social History   Social History  . Marital Status: Married    Spouse Name: [name]  . Number of Children: 6  . Years of Education: 11th   Occupational History  . Retired    Social History Main Topics  . Smoking status: Never Smoker   . Smokeless tobacco: Never Used  . Alcohol Use: No  . Drug Use: No  . Sexual Activity: Not on file   Other Topics Concern  . Not on file   Social History Narrative   Patient lives at home with family.   Caffeine Use: 1 cup of coffee      Review of Systems  All other systems reviewed and are negative.      Objective:   Physical Exam  Constitutional: He appears well-developed and well-nourished.  Neck: Neck supple.  Cardiovascular: Normal rate, regular rhythm and normal heart sounds.   Pulmonary/Chest: Effort normal and breath sounds normal. No respiratory distress. He has no wheezes. He has no rales.  Musculoskeletal: He exhibits no edema.  Lymphadenopathy:    He has no cervical adenopathy.  Vitals reviewed.         Assessment & Plan:  Other specified nutritional anemias - Plan: Anemia panel, Fecal occult blood, imunochemical, Fecal occult blood, imunochemical, Fecal occult blood, imunochemical  I will obtain an anemia panel to check for iron, vitamin 123456, or folic acid deficiency. I will also check fecal occult blood cards 3 to check for occult GI bleed. Further workup depending upon the results of these 2 tests. Also possibility would be the anemia of chronic disease

## 2015-09-16 ENCOUNTER — Other Ambulatory Visit: Payer: Medicare Other

## 2015-09-16 DIAGNOSIS — D538 Other specified nutritional anemias: Secondary | ICD-10-CM | POA: Diagnosis not present

## 2015-09-17 LAB — FECAL OCCULT BLOOD, IMMUNOCHEMICAL
FECAL OCCULT BLOOD: NEGATIVE
Fecal Occult Blood: NEGATIVE
Fecal Occult Blood: NEGATIVE

## 2015-09-20 ENCOUNTER — Encounter: Payer: Self-pay | Admitting: Family Medicine

## 2015-09-21 DIAGNOSIS — J449 Chronic obstructive pulmonary disease, unspecified: Secondary | ICD-10-CM | POA: Diagnosis not present

## 2015-09-21 DIAGNOSIS — E119 Type 2 diabetes mellitus without complications: Secondary | ICD-10-CM | POA: Diagnosis not present

## 2015-09-21 DIAGNOSIS — I10 Essential (primary) hypertension: Secondary | ICD-10-CM | POA: Diagnosis not present

## 2015-09-21 DIAGNOSIS — H2513 Age-related nuclear cataract, bilateral: Secondary | ICD-10-CM | POA: Diagnosis not present

## 2015-09-21 LAB — HM DIABETES EYE EXAM

## 2015-09-26 DIAGNOSIS — J45909 Unspecified asthma, uncomplicated: Secondary | ICD-10-CM | POA: Diagnosis not present

## 2015-09-29 ENCOUNTER — Other Ambulatory Visit: Payer: Self-pay | Admitting: Family Medicine

## 2015-09-30 NOTE — Telephone Encounter (Signed)
Refill appropriate and filled per protocol. 

## 2015-10-07 DIAGNOSIS — J449 Chronic obstructive pulmonary disease, unspecified: Secondary | ICD-10-CM | POA: Diagnosis not present

## 2015-10-07 DIAGNOSIS — E119 Type 2 diabetes mellitus without complications: Secondary | ICD-10-CM | POA: Diagnosis not present

## 2015-10-07 DIAGNOSIS — I10 Essential (primary) hypertension: Secondary | ICD-10-CM | POA: Diagnosis not present

## 2015-10-11 ENCOUNTER — Encounter: Payer: Self-pay | Admitting: Family Medicine

## 2015-10-21 ENCOUNTER — Other Ambulatory Visit: Payer: Self-pay | Admitting: Family Medicine

## 2015-10-21 MED ORDER — LOSARTAN POTASSIUM 100 MG PO TABS: 100.0000 mg | ORAL_TABLET | Freq: Every day | ORAL | Status: DC

## 2015-10-24 ENCOUNTER — Emergency Department (HOSPITAL_COMMUNITY): Payer: Medicare Other

## 2015-10-24 ENCOUNTER — Emergency Department (HOSPITAL_COMMUNITY)
Admission: EM | Admit: 2015-10-24 | Discharge: 2015-10-24 | Disposition: A | Discharge status: Home / Self Care | Payer: Medicare Other | Attending: Emergency Medicine | Admitting: Emergency Medicine

## 2015-10-24 ENCOUNTER — Encounter (HOSPITAL_COMMUNITY): Payer: Self-pay | Admitting: Emergency Medicine

## 2015-10-24 ENCOUNTER — Telehealth: Payer: Self-pay | Admitting: Emergency Medicine

## 2015-10-24 DIAGNOSIS — Z7982 Long term (current) use of aspirin: Secondary | ICD-10-CM | POA: Diagnosis not present

## 2015-10-24 DIAGNOSIS — I10 Essential (primary) hypertension: Secondary | ICD-10-CM | POA: Insufficient documentation

## 2015-10-24 DIAGNOSIS — E785 Hyperlipidemia, unspecified: Secondary | ICD-10-CM | POA: Insufficient documentation

## 2015-10-24 DIAGNOSIS — R0602 Shortness of breath: Secondary | ICD-10-CM | POA: Diagnosis not present

## 2015-10-24 DIAGNOSIS — J441 Chronic obstructive pulmonary disease with (acute) exacerbation: Secondary | ICD-10-CM | POA: Diagnosis not present

## 2015-10-24 DIAGNOSIS — Z79899 Other long term (current) drug therapy: Secondary | ICD-10-CM | POA: Diagnosis not present

## 2015-10-24 DIAGNOSIS — Z8673 Personal history of transient ischemic attack (TIA), and cerebral infarction without residual deficits: Secondary | ICD-10-CM | POA: Insufficient documentation

## 2015-10-24 DIAGNOSIS — Z7984 Long term (current) use of oral hypoglycemic drugs: Secondary | ICD-10-CM | POA: Insufficient documentation

## 2015-10-24 DIAGNOSIS — E119 Type 2 diabetes mellitus without complications: Secondary | ICD-10-CM | POA: Insufficient documentation

## 2015-10-24 LAB — BASIC METABOLIC PANEL
Anion gap: 10 (ref 5–15)
BUN: 14 mg/dL (ref 6–20)
CALCIUM: 8.7 mg/dL — AB (ref 8.9–10.3)
CO2: 24 mmol/L (ref 22–32)
CREATININE: 1.38 mg/dL — AB (ref 0.61–1.24)
Chloride: 106 mmol/L (ref 101–111)
GFR, EST AFRICAN AMERICAN: 55 mL/min — AB (ref >60)
GFR, EST NON AFRICAN AMERICAN: 48 mL/min — AB (ref >60)
Glucose, Bld: 141 mg/dL — ABNORMAL HIGH (ref 65–99)
Potassium: 4.3 mmol/L (ref 3.5–5.1)
SODIUM: 140 mmol/L (ref 135–145)

## 2015-10-24 LAB — CBC
HEMATOCRIT: 39 % (ref 39.0–52.0)
HEMOGLOBIN: 13.6 g/dL (ref 13.0–17.0)
MCH: 31.1 pg (ref 26.0–34.0)
MCHC: 34.9 g/dL (ref 30.0–36.0)
MCV: 89.2 fL (ref 78.0–100.0)
Platelets: 234 10*3/uL (ref 150–400)
RBC: 4.37 MIL/uL (ref 4.22–5.81)
RDW: 12.7 % (ref 11.5–15.5)
WBC: 7.4 10*3/uL (ref 4.0–10.5)

## 2015-10-24 MED ORDER — ALBUTEROL (5 MG/ML) CONTINUOUS INHALATION SOLN
10.0000 mg/h | INHALATION_SOLUTION | RESPIRATORY_TRACT | Status: AC
  Administered 2015-10-24: 10 mg/h via RESPIRATORY_TRACT
  Filled 2015-10-24: qty 20

## 2015-10-24 MED ORDER — PREDNISONE 20 MG PO TABS: 40.0000 mg | ORAL_TABLET | Freq: Every day | ORAL | Status: DC

## 2015-10-24 MED ORDER — ALBUTEROL SULFATE HFA 108 (90 BASE) MCG/ACT IN AERS: 2.0000 | INHALATION_SPRAY | RESPIRATORY_TRACT | Status: DC | PRN

## 2015-10-24 MED ORDER — METHYLPREDNISOLONE SODIUM SUCC 125 MG IJ SOLR
125.0000 mg | Freq: Once | INTRAMUSCULAR | Status: AC
  Administered 2015-10-24: 125 mg via INTRAVENOUS
  Filled 2015-10-24: qty 2

## 2015-10-24 MED ORDER — FLUTICASONE-SALMETEROL 250-50 MCG/DOSE IN AEPB: 1.0000 | INHALATION_SPRAY | Freq: Every day | RESPIRATORY_TRACT | Status: DC

## 2015-10-24 NOTE — Telephone Encounter (Signed)
Samples have been placed at the front of the office.  LM with pt's wife to have the pt return our call.

## 2015-10-24 NOTE — Discharge Instructions (Signed)

## 2015-10-24 NOTE — ED Notes (Signed)
Increasing sob since last night.  Pt states that he "could not breathe at all this morning and felt like I was smothering".  Pt is speaking in full sentences and no visible distress, no fever with this. No CP with this.  Hx copd

## 2015-10-24 NOTE — ED Notes (Addendum)
Pt states he is feeling much better at this time.  No wheezing noted after completion of treatment.  Ambulatory to bathroom with steady gait.

## 2015-10-24 NOTE — ED Provider Notes (Signed)
CSN: [id number]     Arrival date & time 10/24/15  1134 History   First MD Initiated Contact with Patient 10/24/15 1154     Chief Complaint  Patient presents with  . Shortness of Breath     (Consider location/radiation/quality/duration/timing/severity/associated sxs/prior Treatment) HPI Comments: Pt has hx of COPD = has had increased SOB X 2 days Gradually worsening, wheezgin and occaional coughing Sx are persistent over 2 days, worsening and not assocaited with CP, fever or swelling of legs. Worse with pollen in the air.  Patient is a 80 y.o. male presenting with shortness of breath. The history is provided by the patient and a relative.  Shortness of Breath   Past Medical History  Diagnosis Date  . COPD (chronic obstructive pulmonary disease) ([place])   . Hx-TIA (transient ischemic attack)   . Diabetes mellitus   . Hypertension   . Hyperlipidemia   . Anxiety   . CVA (cerebral vascular accident) ([place])   . Dementia    Past Surgical History  Procedure Laterality Date  . Rotator cuff repair    . Cholecystectomy N/A 02/23/2013    Procedure: LAPAROSCOPIC CHOLECYSTECTOMY;  Surgeon: Jamesetta So, MD;  Location: [facility];  Service: General;  Laterality: N/A;  . Ercp N/A 02/24/2013    Procedure: ENDOSCOPIC RETROGRADE CHOLANGIOPANCREATOGRAPHY;  Surgeon: Daneil Dolin, MD;  Location: [facility];  Service: Endoscopy;  Laterality: N/A;   Family History  Problem Relation Age of Onset  . Colon cancer Neg Hx   . Hypertension Mother   . Hypertension Father    Social History  Substance Use Topics  . Smoking status: Never Smoker   . Smokeless tobacco: Never Used  . Alcohol Use: No    Review of Systems  Respiratory: Positive for shortness of breath.   All other systems reviewed and are negative.     Allergies  Ace inhibitors  Home Medications   Prior to Admission medications   Medication Sig Start Date End Date Taking? Authorizing Provider  aspirin EC 81 MG tablet Take 81 mg by  mouth daily.   Yes Historical Provider, MD  donepezil ([name]) 5 MG tablet Take 1 tablet (5 mg total) by mouth at bedtime. 10/07/14  Yes Susy Frizzle, MD  Fluticasone-Salmeterol (ADVAIR) 250-50 MCG/DOSE AEPB Inhale 1 puff into the lungs every 12 (twelve) hours. 06/27/15  Yes Tammy S Parrett, NP  linagliptin (TRADJENTA) 5 MG TABS tablet Take 1 tablet (5 mg total) by mouth daily. 08/23/15  Yes Susy Frizzle, MD  losartan (COZAAR) 100 MG tablet Take 1 tablet (100 mg total) by mouth daily. 10/21/15  Yes Susy Frizzle, MD  pioglitazone (ACTOS) 30 MG tablet TAKE 1 TABLET (30 MG TOTAL) BY MOUTH DAILY. 09/30/15  Yes Susy Frizzle, MD  albuterol (PROVENTIL HFA;VENTOLIN HFA) 108 (90 Base) MCG/ACT inhaler Inhale 2 puffs into the lungs every 4 (four) hours as needed for wheezing or shortness of breath. 10/24/15   [name], MD  Fluticasone-Salmeterol (ADVAIR DISKUS) 250-50 MCG/DOSE AEPB Inhale 1 puff into the lungs daily. 10/24/15 10/25/15  Collene Gobble, MD  predniSONE (DELTASONE) 20 MG tablet Take 2 tablets (40 mg total) by mouth daily. 10/24/15   [name], MD   BP 137/58 mmHg  Pulse 94  Temp(Src) 97.9 F (36.6 C) (Oral)  Resp 23  Ht 6' (1.829 m)  Wt 230 lb (104.327 kg)  BMI 31.19 kg/m2  SpO2 98% Physical Exam  Constitutional: He appears well-developed and well-nourished. No distress.  HENT:  Head: Normocephalic and atraumatic.  Mouth/Throat: Oropharynx is clear and moist. No oropharyngeal exudate.  Eyes: Conjunctivae and EOM are normal. Pupils are equal, round, and reactive to light. Right eye exhibits no discharge. Left eye exhibits no discharge. No scleral icterus.  Neck: Normal range of motion. Neck supple. No JVD present. No thyromegaly present.  Cardiovascular: Normal rate, regular rhythm, normal heart sounds and intact distal pulses.  Exam reveals no gallop and no friction rub.   No murmur heard. Pulmonary/Chest: Effort normal. No respiratory distress. He has wheezes. He has no  rales.  Abdominal: Soft. Bowel sounds are normal. He exhibits no distension and no mass. There is no tenderness.  Musculoskeletal: Normal range of motion. He exhibits no edema or tenderness.  Lymphadenopathy:    He has no cervical adenopathy.  Neurological: He is alert. Coordination normal.  Skin: Skin is warm and dry. No rash noted. No erythema.  Psychiatric: He has a normal mood and affect. His behavior is normal.  Nursing note and vitals reviewed.   ED Course  Procedures (including critical care time) Labs Review Labs Reviewed  BASIC METABOLIC PANEL - Abnormal; Notable for the following:    Glucose, Bld 141 (*)    Creatinine, Ser 1.38 (*)    Calcium 8.7 (*)    GFR calc non Af Amer 48 (*)    GFR calc Af Amer 55 (*)    All other components within normal limits  CBC    Imaging Review Dg Chest 2 View  10/24/2015  CLINICAL DATA:  Onset of acute shortness of breath last night. EXAM: CHEST  2 VIEW COMPARISON:  09/01/2015, 06/21/2015, 06/11/2015 and 02/03/2015 and CT scan dated 03/25/2015 FINDINGS: There is a chronic area of round atelectasis/consolidation at the right lung base posteriorly with chronic pleural thickening laterally. The appearance is essentially unchanged since September 2016. Heart size and pulmonary vascularity are normal. Calcification in the arch of the aorta. Left lung is clear.  No bone abnormality. IMPRESSION: No active cardiopulmonary disease. Chronic scarring, pleural thickening and chronic round atelectasis at the right lung base. Aortic atherosclerosis. Electronically Signed   By: Lorriane Shire M.D.   On: 10/24/2015 12:56   I have personally reviewed and evaluated these images and lab results as part of my medical decision-making.   EKG Interpretation   Date/Time:  Monday October 24 2015 12:00:19 EDT Ventricular Rate:  74 PR Interval:  195 QRS Duration: 111 QT Interval:  385 QTC Calculation: 427 R Axis:   24 Text Interpretation:  Sinus rhythm Abnormal  R-wave progression, early  transition since last tracing no significant change Confirmed by Camran Keady   MD, Pershing Skidmore ([id number]) on 10/24/2015 12:05:16 PM      MDM   Final diagnoses:  COPD exacerbation ([place])    Diffuse wheezing in all lung fields Speaks in full sentences 'not in distress VS reassuring Neb CXR, solumedrol reeval Pt in agreement.  On reexam, he has clear lung sounds with sporadic wheezing - he now points out a small area of redness to the inner L calf - with central black punctate spot - this is non tender but itchy - pt informed he needs a hydrocortisone cream and f/u with PCP if not improved as may need biopsy - has been present for several months - he doesn't recall an initial injury.  Meds given in ED:  Medications  albuterol (PROVENTIL,VENTOLIN) solution continuous neb (0 mg/hr Nebulization Stopped 10/24/15 1412)  methylPREDNISolone sodium succinate (SOLU-MEDROL) 125 mg/2 mL injection  125 mg (125 mg Intravenous Given 10/24/15 1221)    New Prescriptions   ALBUTEROL (PROVENTIL HFA;VENTOLIN HFA) 108 (90 BASE) MCG/ACT INHALER    Inhale 2 puffs into the lungs every 4 (four) hours as needed for wheezing or shortness of breath.   FLUTICASONE-SALMETEROL (ADVAIR DISKUS) 250-50 MCG/DOSE AEPB    Inhale 1 puff into the lungs daily.   PREDNISONE (DELTASONE) 20 MG TABLET    Take 2 tablets (40 mg total) by mouth daily.      [name], MD 10/24/15 [phone]

## 2015-10-25 NOTE — Telephone Encounter (Signed)
Spoke with pt's wife, aware of samples being placed up front.  Nothing further needed.

## 2015-10-26 ENCOUNTER — Other Ambulatory Visit: Payer: Self-pay | Admitting: Family Medicine

## 2015-10-26 NOTE — Telephone Encounter (Signed)
Refill appropriate and filled per protocol. 

## 2015-10-27 DIAGNOSIS — J45909 Unspecified asthma, uncomplicated: Secondary | ICD-10-CM | POA: Diagnosis not present

## 2015-11-21 ENCOUNTER — Ambulatory Visit: Payer: Self-pay | Admitting: Family Medicine

## 2015-11-24 ENCOUNTER — Encounter: Payer: Self-pay | Admitting: Emergency Medicine

## 2015-11-24 ENCOUNTER — Ambulatory Visit: Payer: Medicare Other | Admitting: Emergency Medicine

## 2015-11-24 ENCOUNTER — Ambulatory Visit (INDEPENDENT_AMBULATORY_CARE_PROVIDER_SITE_OTHER): Payer: Medicare Other | Admitting: Emergency Medicine

## 2015-11-24 ENCOUNTER — Encounter (INDEPENDENT_AMBULATORY_CARE_PROVIDER_SITE_OTHER): Payer: Self-pay

## 2015-11-24 VITALS — BP 124/84 | HR 70 | Ht 72.0 in | Wt 225.0 lb

## 2015-11-24 DIAGNOSIS — J9811 Atelectasis: Secondary | ICD-10-CM

## 2015-11-24 DIAGNOSIS — J4521 Mild intermittent asthma with (acute) exacerbation: Secondary | ICD-10-CM | POA: Diagnosis not present

## 2015-11-24 NOTE — Patient Instructions (Signed)
Please continue Advair twice a day. Remember to rinse and gargle after using this medication. Please continue to use albuterol up to every 4 hours if needed for shortness of breath. We will repeat your CT scan of the chest without contrast in September 2017 Follow with Dr Lamonte Sakai in 5 months or sooner if you have any problems

## 2015-11-24 NOTE — Progress Notes (Signed)
Subjective:    Patient ID: [name], male    DOB: [birth year], 80 y.o.   MRN: [id number]  Asthma He complains of shortness of breath. There is no cough or wheezing. Associated symptoms include postnasal drip. Pertinent negatives include no appetite change, ear pain, fever, headaches, rhinorrhea, sneezing, sore throat or trouble swallowing. His past medical history is significant for asthma.   80 yo former smoker (very remote), hx of HTN, DM, CVA. Carries the dx of COPD / Asthma although never smoker. He was admitted to [facility] with slowly progressive dyspnea mid-May. Found to have a R pleural effusion > thora  Pleural fluid 11/25/13 > alb 3.0, TP 5.3, glu 162, LDH 159.  Cytology negative, WBC 1378 predominant lymphs no PMN, cx negative  ROV 01/29/14 -- follows for a R effusion and abnormal CT scan chest. The pleural effusion was well drained on 6/18, allows visualization of a rounded RLL opacity that looks like Atx, consider also malignancy but less likely. The repeat cytology is negative for malignancy. Returns today with a CXR. He improved with the thora and has not declined since then.   I think at this point we can follow him for re-accumulation. It the effusion returns then we will likely pursue repeat thora + biopsy or pleuroscopy. He may merit FOB or VATS to sample the RLL region of consolidation. If the fluid does not reaccumulate then I will plan to repeat his Ct scan in 2-3 months. Please schedule him to come back to see me in 2-3 weeks with a CXR.  ROV 04/01/14 -- R effusion and abnormal area of rounded RLL infiltrate. Repeat CT scan 9/16 >> the RLL opacity is more rounded, has caused some basilar volume loss. No clear mass seen. He has felt well - has had cough and associated dyspnea. He relates this to missing his Advair. No sputum or hemoptysis  ROV 04/21/14 -- follow up for RLL rounded infiltrate vs mass. His PET scan was reassuring - The RLL lesion looks like rounded atelectasis.    ROV 03/31/15 -- , follow-up visit for minimal tobacco abuse but with signs and symptoms suggestive of COPD/asthma. We have been following an area of rounded airspace opacity in the right lower lobe. His most recent CT scan of the chest was 03/25/15 that I have reviewed personally. It showed that the rounded area is slightly smaller then his CT scan and PET scan a year ago. Most consistent with rounded atelectasis.    ROV 11/24/15 -- patient with a history of COPD/asthma and an abnormal CT scan of the chest with rounded airspace disease in his right lower lobe. We have followed this with serial scans also a PET scan. On last evaluation it was smaller, likely needs a repeat scan in September 2017. He has been treated at least twice for acute exacerbation since our last visit. Also note that he has had some anemia that's being evaluated by Dr Dennard Schaumann.  He is on Advair 250, takes reliably. He uses albuterol about once daily, sometimes not at all. He is not very active, able to exert some. He walks a mile before stopping to rest.     Review of Systems  Constitutional: Negative for fever and appetite change.  HENT: Positive for postnasal drip. Negative for congestion, dental problem, ear pain, nosebleeds, rhinorrhea, sinus pressure, sneezing, sore throat and trouble swallowing.   Eyes: Negative for redness and itching.  Respiratory: Positive for shortness of breath. Negative for cough, chest tightness and  wheezing.   Cardiovascular: Negative for palpitations and leg swelling.  Gastrointestinal: Negative for nausea and vomiting.  Genitourinary: Negative for dysuria.  Musculoskeletal: Negative for joint swelling.  Skin: Negative for rash.  Neurological: Negative for headaches.  Hematological: Does not bruise/bleed easily.  Psychiatric/Behavioral: Negative for dysphoric mood. The patient is not nervous/anxious.        Objective:   Physical Exam Filed Vitals:   11/24/15 1043  BP: 124/84  Pulse: 70   Height: 6' (1.829 m)  Weight: 225 lb (102.059 kg)  SpO2: 100%   Kenneth: Pleasant, well-nourished, in no distress,  normal affect  ENT: No lesions,  mouth clear,  oropharynx clear, no postnasal drip  Neck: No JVD, no TMG, no carotid bruits  Lungs: No use of accessory muscles, clear without rales or rhonchi, decreased at the R base, no crackles.   Cardiovascular: RRR, heart sounds normal, no murmur or gallops, no peripheral edema  Musculoskeletal: No deformities, no cyanosis or clubbing  Neuro: alert, non focal  Skin: Warm, no lesions or rashes     Assessment & Plan:  Atelectasis of right lung Likely rounded atelectasis. We will follow his CT scan of the chest without contrast in September 2017 to look for interval change.  Intrinsic asthma He has experienced 2 exacerbations since her last visit but currently he is doing well. He is benefiting from the restart of Advair. We talked about rinsing and gargling after using. He will keep albuterol available to use when necessary.   Baltazar Apo, MD, PhD 11/24/2015, 11:04 AM [facility] [phone] or if no answer [phone]

## 2015-11-24 NOTE — Assessment & Plan Note (Signed)
He has experienced 2 exacerbations since her last visit but currently he is doing well. He is benefiting from the restart of Advair. We talked about rinsing and gargling after using. He will keep albuterol available to use when necessary.

## 2015-11-24 NOTE — Addendum Note (Signed)
Addended by: Desmond Dike C on: 11/24/2015 11:06 AM   Modules accepted: Orders

## 2015-11-24 NOTE — Assessment & Plan Note (Signed)
Likely rounded atelectasis. We will follow his CT scan of the chest without contrast in September 2017 to look for interval change.

## 2015-11-26 DIAGNOSIS — J45909 Unspecified asthma, uncomplicated: Secondary | ICD-10-CM | POA: Diagnosis not present

## 2015-11-28 ENCOUNTER — Ambulatory Visit: Payer: Medicare Other | Admitting: Family Medicine

## 2015-11-29 ENCOUNTER — Other Ambulatory Visit: Payer: Self-pay | Admitting: Family Medicine

## 2015-11-29 MED ORDER — PIOGLITAZONE HCL 30 MG PO TABS: ORAL_TABLET | ORAL | Status: DC

## 2015-12-08 ENCOUNTER — Other Ambulatory Visit: Payer: Self-pay | Admitting: *Deleted

## 2015-12-08 MED ORDER — PIOGLITAZONE HCL 30 MG PO TABS: ORAL_TABLET | ORAL | Status: DC

## 2015-12-27 ENCOUNTER — Encounter: Payer: Self-pay | Admitting: Family Medicine

## 2015-12-27 ENCOUNTER — Ambulatory Visit (INDEPENDENT_AMBULATORY_CARE_PROVIDER_SITE_OTHER): Payer: Medicare Other | Admitting: Family Medicine

## 2015-12-27 VITALS — BP 140/78 | HR 82 | Temp 98.0°F | Resp 18 | Ht 70.0 in | Wt 226.0 lb

## 2015-12-27 DIAGNOSIS — F0390 Unspecified dementia without behavioral disturbance: Secondary | ICD-10-CM | POA: Diagnosis not present

## 2015-12-27 DIAGNOSIS — R809 Proteinuria, unspecified: Secondary | ICD-10-CM

## 2015-12-27 DIAGNOSIS — E1129 Type 2 diabetes mellitus with other diabetic kidney complication: Secondary | ICD-10-CM | POA: Diagnosis not present

## 2015-12-27 DIAGNOSIS — J45909 Unspecified asthma, uncomplicated: Secondary | ICD-10-CM | POA: Diagnosis not present

## 2015-12-27 DIAGNOSIS — E1165 Type 2 diabetes mellitus with hyperglycemia: Secondary | ICD-10-CM | POA: Diagnosis not present

## 2015-12-27 DIAGNOSIS — G4700 Insomnia, unspecified: Secondary | ICD-10-CM | POA: Diagnosis not present

## 2015-12-27 DIAGNOSIS — I10 Essential (primary) hypertension: Secondary | ICD-10-CM | POA: Diagnosis not present

## 2015-12-27 DIAGNOSIS — Z8673 Personal history of transient ischemic attack (TIA), and cerebral infarction without residual deficits: Secondary | ICD-10-CM | POA: Diagnosis not present

## 2015-12-27 LAB — COMPLETE METABOLIC PANEL WITH GFR
ALT: 9 U/L (ref 9–46)
AST: 14 U/L (ref 10–35)
Albumin: 4.2 g/dL (ref 3.6–5.1)
Alkaline Phosphatase: 74 U/L (ref 40–115)
BUN: 16 mg/dL (ref 7–25)
CHLORIDE: 106 mmol/L (ref 98–110)
CO2: 20 mmol/L (ref 20–31)
CREATININE: 1.25 mg/dL — AB (ref 0.70–1.18)
Calcium: 8.6 mg/dL (ref 8.6–10.3)
GFR, EST AFRICAN AMERICAN: 64 mL/min (ref >=60)
GFR, Est Non African American: 55 mL/min — ABNORMAL LOW (ref >=60)
Glucose, Bld: 163 mg/dL — ABNORMAL HIGH (ref 70–99)
POTASSIUM: 4.1 mmol/L (ref 3.5–5.3)
Sodium: 140 mmol/L (ref 135–146)
Total Bilirubin: 0.4 mg/dL (ref 0.2–1.2)
Total Protein: 7.2 g/dL (ref 6.1–8.1)

## 2015-12-27 LAB — CBC WITH DIFFERENTIAL/PLATELET
BASOS PCT: 1 %
Basophils Absolute: 66 cells/uL (ref 0–200)
EOS PCT: 4 %
Eosinophils Absolute: 264 cells/uL (ref 15–500)
HCT: 36.2 % — ABNORMAL LOW (ref 38.5–50.0)
HEMOGLOBIN: 12.5 g/dL — AB (ref 13.0–17.0)
LYMPHS ABS: 1584 {cells}/uL (ref 850–3900)
Lymphocytes Relative: 24 %
MCH: 31 pg (ref 27.0–33.0)
MCHC: 34.5 g/dL (ref 32.0–36.0)
MCV: 89.8 fL (ref 80.0–100.0)
MONO ABS: 528 {cells}/uL (ref 200–950)
MPV: 8.8 fL (ref 7.5–12.5)
Monocytes Relative: 8 %
Neutro Abs: 4158 cells/uL (ref 1500–7800)
Neutrophils Relative %: 63 %
PLATELETS: 235 10*3/uL (ref 140–400)
RBC: 4.03 MIL/uL — AB (ref 4.20–5.80)
RDW: 13.9 % (ref 11.0–15.0)
WBC: 6.6 10*3/uL (ref 3.8–10.8)

## 2015-12-27 LAB — LIPID PANEL
CHOL/HDL RATIO: 5.3 ratio — AB (ref <=5.0)
Cholesterol: 168 mg/dL (ref 125–200)
HDL: 32 mg/dL — ABNORMAL LOW (ref >=40)
LDL CALC: 98 mg/dL (ref <130)
TRIGLYCERIDES: 188 mg/dL — AB (ref <150)
VLDL: 38 mg/dL — AB (ref <30)

## 2015-12-27 LAB — HEMOGLOBIN A1C
Hgb A1c MFr Bld: 7.2 % — ABNORMAL HIGH (ref <5.7)
Mean Plasma Glucose: 160 mg/dL

## 2015-12-27 MED ORDER — TRAZODONE HCL 50 MG PO TABS: 25.0000 mg | ORAL_TABLET | Freq: Every evening | ORAL | Status: DC | PRN

## 2015-12-27 NOTE — Progress Notes (Signed)
Subjective:    Patient ID: [name], male    DOB: Jun 30, 1936, 80 y.o.   MRN: [id number]  HPI 02/03/15 Patient is a 80 year old African-American male here today for follow-up of his diabetes mellitus. He has a history of stroke. He also has a history of hypertension. His blood pressure today is well controlled at 132/68. He denies any chest pain or shortness of breath. He recently had an asthma attack requiring EMS but he attributed this to the heat outside. This shortness of breath improved immediately with albuterol. Otherwise he has been doing well. He reports fasting blood sugars between 101 120. He reports two-hour postprandial sugars less than 150. However I doubt the patient is checking his sugar very often. He denies any hypoglycemic episodes. He denies any bleeding or bruising on aspirin.  At that time, my plan was: Regarding the patient's diabetes, I will check a hemoglobin A1c as well as a urine microalbumin. Goal hemoglobin A1c is less than 6.5. Patient's blood pressure is adequately controlled. I did suggest the patient take losartan 100 mg by mouth daily rather than 50 mg a day to maximize his renal protection. I would like the patient to return fasting for fasting lipid panel. Given his history of stroke, his goal LDL cholesterol is less than 70.  08/18/15  patient is overdue for diabetic eye exam. He is also due for Prevnar 13. Pneumovax 23 is up-to-date. He states that his fasting blood sugars and two-hour postprandial sugars are ranging between 100-150 however I doubt the validity of these numbers as the patient appears confused and given his dementia I do not believe that these are accurate. I'm not even sure if he is checking his blood sugar. He denies any hypoglycemia.   He denies any polyuria, polydipsia, or blurred vision. He denies any chest pain shortness of breath or dyspnea on exertion. He denies any myalgias or right upper quadrant pain. Diabetic foot exam is performed today  and shows significant hyperkeratosis on both feet but no ulcers. He has palpable pulses in both feet but it is weak. He also has normal sensation to 10 g monofilament.  At that time, my plan was:  blood pressure is excellent. I will make no changes in his blood pressure medication. Patient is compliant taking his aspirin which he is using for secondary prevention of stroke. I will check a fasting lipid panel. Goal LDL cholesterol is less than 70 given his history of stroke. I will also check a hemoglobin A1c with a goal hemoglobin A1c of 6.5. I will also check a urine microalbumin as it has been elevated in the past. I will schedule the patient for diabetic eye exam. He also received Prevnar 13 today in office. Given his dementia , and abnormalities on his foot exam, I recommended home health come out and evaluate the patient's home living situation and see if he is available for any assistance around the home with activities of daily living.  12/27/15 Patient is here today accompanied by his wife for follow-up. She states that his dementia is worsening. He is becoming more confused. She is afraid to take him places such as [place] dinner or to vacation because he will frequently "throw a fit". One or 2 hours later he has no recollection of abnormal behavior. He also tends to sundown at night and stay awake all night long watching television which keeps her awake. He is currently on [name] 5 mg a day. His blood pressure is well  controlled at 140/78. He is due for fasting lipid panel to look at his LDL cholesterol as his goal is less than 70. He is also due to recheck his hemoglobin A1c. Patient is noncompliant with any type of diabetic diet. He tends to eat whatever he wants. They're not checking his sugar at home. He denies any polyuria, polydipsia, or blurred vision. He denies any hypoglycemia Past Medical History  Diagnosis Date  . COPD (chronic obstructive pulmonary disease) ([place])   . Hx-TIA (transient  ischemic attack)   . Diabetes mellitus   . Hypertension   . Hyperlipidemia   . Anxiety   . CVA (cerebral vascular accident) ([place])   . Dementia    Past Surgical History  Procedure Laterality Date  . Rotator cuff repair    . Cholecystectomy N/A 02/23/2013    Procedure: LAPAROSCOPIC CHOLECYSTECTOMY;  Surgeon: Jamesetta So, MD;  Location: [facility];  Service: General;  Laterality: N/A;  . Ercp N/A 02/24/2013    Procedure: ENDOSCOPIC RETROGRADE CHOLANGIOPANCREATOGRAPHY;  Surgeon: Daneil Dolin, MD;  Location: [facility];  Service: Endoscopy;  Laterality: N/A;   Current Outpatient Prescriptions on File Prior to Visit  Medication Sig Dispense Refill  . albuterol (PROVENTIL HFA;VENTOLIN HFA) 108 (90 Base) MCG/ACT inhaler Inhale 2 puffs into the lungs every 4 (four) hours as needed for wheezing or shortness of breath. 1 Inhaler 3  . albuterol (PROVENTIL) (2.5 MG/3ML) 0.083% nebulizer solution USE 1 VIAL IN NEBULIZER EVERY 6 HOURS AS NEEDED FOR WHEEZING OR SHORTNESS OF BREATHE 75 mL 3  . aspirin EC 81 MG tablet Take 81 mg by mouth daily.    [name] Kitchen donepezil ([name]) 5 MG tablet Take 1 tablet (5 mg total) by mouth at bedtime. 30 tablet 5  . Fluticasone-Salmeterol (ADVAIR) 250-50 MCG/DOSE AEPB Inhale 1 puff into the lungs every 12 (twelve) hours. 60 each 11  . linagliptin (TRADJENTA) 5 MG TABS tablet Take 1 tablet (5 mg total) by mouth daily. 30 tablet 2  . losartan (COZAAR) 100 MG tablet Take 1 tablet (100 mg total) by mouth daily. 90 tablet 3  . pioglitazone (ACTOS) 30 MG tablet TAKE 1 TABLET (30 MG TOTAL) BY MOUTH DAILY. 90 tablet 1   No current facility-administered medications on file prior to visit.   Allergies  Allergen Reactions  . Ace Inhibitors Swelling   Social History   Social History  . Marital Status: Married    Spouse Name: [name]  . Number of Children: 6  . Years of Education: 11th   Occupational History  . Retired    Social History Main Topics  . Smoking status: Never Smoker     . Smokeless tobacco: Never Used  . Alcohol Use: No  . Drug Use: No  . Sexual Activity: Not on file   Other Topics Concern  . Not on file   Social History Narrative   Patient lives at home with family.   Caffeine Use: 1 cup of coffee      Review of Systems  All other systems reviewed and are negative.      Objective:   Physical Exam  Constitutional: He appears well-developed and well-nourished.  Neck: Neck supple. No thyromegaly present.  Cardiovascular: Normal rate, regular rhythm, normal heart sounds and intact distal pulses.   No murmur heard. Pulmonary/Chest: Effort normal and breath sounds normal. No respiratory distress. He has no wheezes. He has no rales.  Abdominal: Soft. Bowel sounds are normal.  Musculoskeletal: He exhibits no edema.  Lymphadenopathy:  He has no cervical adenopathy.  Vitals reviewed.         Assessment & Plan:  Uncontrolled type 2 diabetes mellitus with microalbuminuria, unspecified long term insulin use status (HCC) - Plan: CBC with Differential/Platelet, COMPLETE METABOLIC PANEL WITH GFR, Lipid panel, Hemoglobin A1c, Microalbumin, urine  Dementia, without behavioral disturbance  History of CVA (cerebrovascular accident)  Essential hypertension, benign  Insomnia - Plan: traZODone (DESYREL) 50 MG tablet Discontinue [name] and replaced with Namzeric and gradually titrate up to 10 mg/28 mg daily. Add trazodone 50 mg by mouth daily at bedtime for insomnia. Blood pressures acceptable. Check fasting lipid panel. Goal LDL cholesterol is less than 70 given his history of stroke. Check hemoglobin A1c. I would be very pleased with his hemoglobin A1c less than 7. Monitor his chronic kidney disease.

## 2016-01-04 ENCOUNTER — Other Ambulatory Visit: Payer: Self-pay | Admitting: Family Medicine

## 2016-01-04 MED ORDER — ATORVASTATIN CALCIUM 40 MG PO TABS: 40.0000 mg | ORAL_TABLET | Freq: Every day | ORAL | Status: DC

## 2016-01-26 DIAGNOSIS — J45909 Unspecified asthma, uncomplicated: Secondary | ICD-10-CM | POA: Diagnosis not present

## 2016-02-03 DIAGNOSIS — H25813 Combined forms of age-related cataract, bilateral: Secondary | ICD-10-CM | POA: Diagnosis not present

## 2016-02-03 DIAGNOSIS — E119 Type 2 diabetes mellitus without complications: Secondary | ICD-10-CM | POA: Diagnosis not present

## 2016-02-03 DIAGNOSIS — Z7984 Long term (current) use of oral hypoglycemic drugs: Secondary | ICD-10-CM | POA: Diagnosis not present

## 2016-02-03 LAB — HM DIABETES EYE EXAM

## 2016-02-09 ENCOUNTER — Encounter: Payer: Self-pay | Admitting: Family Medicine

## 2016-02-12 ENCOUNTER — Emergency Department (HOSPITAL_COMMUNITY)
Admission: EM | Admit: 2016-02-12 | Discharge: 2016-02-12 | Disposition: A | Discharge status: Home / Self Care | Payer: Medicare Other | Attending: Emergency Medicine | Admitting: Emergency Medicine

## 2016-02-12 ENCOUNTER — Encounter (HOSPITAL_COMMUNITY): Payer: Self-pay | Admitting: Emergency Medicine

## 2016-02-12 ENCOUNTER — Emergency Department (HOSPITAL_COMMUNITY): Payer: Medicare Other

## 2016-02-12 DIAGNOSIS — Z7982 Long term (current) use of aspirin: Secondary | ICD-10-CM | POA: Insufficient documentation

## 2016-02-12 DIAGNOSIS — R0602 Shortness of breath: Secondary | ICD-10-CM | POA: Diagnosis not present

## 2016-02-12 DIAGNOSIS — R05 Cough: Secondary | ICD-10-CM | POA: Diagnosis not present

## 2016-02-12 DIAGNOSIS — Z79899 Other long term (current) drug therapy: Secondary | ICD-10-CM | POA: Diagnosis not present

## 2016-02-12 DIAGNOSIS — I10 Essential (primary) hypertension: Secondary | ICD-10-CM | POA: Diagnosis not present

## 2016-02-12 DIAGNOSIS — J441 Chronic obstructive pulmonary disease with (acute) exacerbation: Secondary | ICD-10-CM | POA: Diagnosis not present

## 2016-02-12 DIAGNOSIS — Z7984 Long term (current) use of oral hypoglycemic drugs: Secondary | ICD-10-CM | POA: Insufficient documentation

## 2016-02-12 DIAGNOSIS — E119 Type 2 diabetes mellitus without complications: Secondary | ICD-10-CM | POA: Insufficient documentation

## 2016-02-12 LAB — COMPREHENSIVE METABOLIC PANEL
ALBUMIN: 4.2 g/dL (ref 3.5–5.0)
ALT: 14 U/L — AB (ref 17–63)
AST: 18 U/L (ref 15–41)
Alkaline Phosphatase: 98 U/L (ref 38–126)
Anion gap: 6 (ref 5–15)
BUN: 13 mg/dL (ref 6–20)
CHLORIDE: 106 mmol/L (ref 101–111)
CO2: 23 mmol/L (ref 22–32)
CREATININE: 1.41 mg/dL — AB (ref 0.61–1.24)
Calcium: 8 mg/dL — ABNORMAL LOW (ref 8.9–10.3)
GFR calc Af Amer: 54 mL/min — ABNORMAL LOW (ref >60)
GFR calc non Af Amer: 47 mL/min — ABNORMAL LOW (ref >60)
GLUCOSE: 210 mg/dL — AB (ref 65–99)
Potassium: 3.2 mmol/L — ABNORMAL LOW (ref 3.5–5.1)
SODIUM: 135 mmol/L (ref 135–145)
Total Bilirubin: 0.5 mg/dL (ref 0.3–1.2)
Total Protein: 7.8 g/dL (ref 6.5–8.1)

## 2016-02-12 LAB — CBC WITH DIFFERENTIAL/PLATELET
BASOS ABS: 0.1 10*3/uL (ref 0.0–0.1)
Basophils Relative: 1 %
EOS ABS: 0.5 10*3/uL (ref 0.0–0.7)
EOS PCT: 6 %
HCT: 38.3 % — ABNORMAL LOW (ref 39.0–52.0)
HEMOGLOBIN: 13 g/dL (ref 13.0–17.0)
Lymphocytes Relative: 24 %
Lymphs Abs: 2 10*3/uL (ref 0.7–4.0)
MCH: 30.4 pg (ref 26.0–34.0)
MCHC: 33.9 g/dL (ref 30.0–36.0)
MCV: 89.5 fL (ref 78.0–100.0)
Monocytes Absolute: 0.6 10*3/uL (ref 0.1–1.0)
Monocytes Relative: 7 %
NEUTROS PCT: 62 %
Neutro Abs: 5.3 10*3/uL (ref 1.7–7.7)
PLATELETS: 209 10*3/uL (ref 150–400)
RBC: 4.28 MIL/uL (ref 4.22–5.81)
RDW: 12.5 % (ref 11.5–15.5)
WBC: 8.3 10*3/uL (ref 4.0–10.5)

## 2016-02-12 MED ORDER — IPRATROPIUM-ALBUTEROL 0.5-2.5 (3) MG/3ML IN SOLN
3.0000 mL | Freq: Once | RESPIRATORY_TRACT | Status: AC
Start: 2016-02-12 — End: 2016-02-12
  Administered 2016-02-12: 3 mL via RESPIRATORY_TRACT
  Filled 2016-02-12: qty 3

## 2016-02-12 MED ORDER — METHYLPREDNISOLONE SODIUM SUCC 125 MG IJ SOLR
125.0000 mg | Freq: Once | INTRAMUSCULAR | Status: AC
  Administered 2016-02-12: 125 mg via INTRAVENOUS
  Filled 2016-02-12: qty 2

## 2016-02-12 MED ORDER — ALBUTEROL SULFATE (2.5 MG/3ML) 0.083% IN NEBU
5.0000 mg | INHALATION_SOLUTION | Freq: Once | RESPIRATORY_TRACT | Status: AC
  Administered 2016-02-12: 2.5 mg via RESPIRATORY_TRACT
  Filled 2016-02-12: qty 6

## 2016-02-12 NOTE — ED Triage Notes (Signed)
Patient c/o shortness of breath that started yesterday. Patient reports productive cough with thick yellow sputum. Denies any chest pain, swelling, or fevers. Per patient has hx of COPD in which he uses neb treatments and inhaler. Patient reports using both this morning without any relief. Daughter also reports patient had diarrhea yesterday as well.

## 2016-02-12 NOTE — ED Provider Notes (Signed)
[facility] DEPT Provider Note   CSN: [id number] Arrival date & time: 02/12/16  1124  First Provider Contact:  11:42 AM    By signing my name below, I, Rayna Sexton, attest that this documentation has been prepared under the direction and in the presence of Milton Ferguson, MD. Electronically Signed: Rayna Sexton, ED Scribe. 02/12/16. 11:47 AM.   History   Chief Complaint Chief Complaint  Patient presents with  . Shortness of Breath    HPI HPI Comments: [name] is a 80 y.o. male with a PMHx of COPD who presents to the Emergency Department complaining of worsening, mild, SOB x 1 day. Pt reports an associated, mild, productive cough with yellow sputum. His daughter confirms his PMHx of COPD and states his symptoms are consistent with prior COPD exacerbations. He uses both an inhaler and nebulizer treatments with his last dose this morning w/o significant relief. He denies any other associated symptoms at this time.  The patient. Complains of some shortness of breath   The history is provided by the patient and a relative. No language interpreter was used.  Shortness of Breath  This is a recurrent problem. The problem occurs intermittently.The current episode started 6 to 12 hours ago. The problem has not changed since onset.Associated symptoms include cough and wheezing. Pertinent negatives include no headaches, no chest pain, no abdominal pain and no rash. He has tried ipratropium inhalers for the symptoms. He has had no prior hospitalizations. Associated medical issues include COPD.    Past Medical History:  Diagnosis Date  . Anxiety   . COPD (chronic obstructive pulmonary disease) ([place])   . CVA (cerebral vascular accident) ([place])   . Dementia   . Diabetes mellitus   . Hx-TIA (transient ischemic attack)   . Hyperlipidemia   . Hypertension     Patient Active Problem List   Diagnosis Date Noted  . Intrinsic asthma 03/31/2015  . Dementia   . Atelectasis of right  lung 01/29/2014  . Pleural effusion on right 11/24/2013  . PNA (pneumonia) 11/24/2013  . Pleural effusion 11/24/2013  . CVA (cerebral vascular accident) ([place])   . Acute encephalopathy 02/27/2013  . Urinary retention 02/27/2013  . Cholecystitis, acute 02/24/2013  . Choledocholithiasis 02/24/2013  . Acute renal failure ([place]) 02/24/2013  . Cholelithiasis 02/21/2013  . DM (diabetes mellitus) ([place]) 02/21/2013  . Diabetes mellitus   . Hypertension   . Hyperlipidemia   . Hx-TIA (transient ischemic attack)     Past Surgical History:  Procedure Laterality Date  . CHOLECYSTECTOMY N/A 02/23/2013   Procedure: LAPAROSCOPIC CHOLECYSTECTOMY;  Surgeon: Jamesetta So, MD;  Location: [facility];  Service: General;  Laterality: N/A;  . ERCP N/A 02/24/2013   Procedure: ENDOSCOPIC RETROGRADE CHOLANGIOPANCREATOGRAPHY;  Surgeon: Daneil Dolin, MD;  Location: [facility];  Service: Endoscopy;  Laterality: N/A;  . ROTATOR CUFF REPAIR         Home Medications    Prior to Admission medications   Medication Sig Start Date End Date Taking? Authorizing Provider  albuterol (PROVENTIL HFA;VENTOLIN HFA) 108 (90 Base) MCG/ACT inhaler Inhale 2 puffs into the lungs every 4 (four) hours as needed for wheezing or shortness of breath. 10/24/15   [name], MD  albuterol (PROVENTIL) (2.5 MG/3ML) 0.083% nebulizer solution USE 1 VIAL IN NEBULIZER EVERY 6 HOURS AS NEEDED FOR WHEEZING OR SHORTNESS OF BREATHE 10/26/15   Susy Frizzle, MD  aspirin EC 81 MG tablet Take 81 mg by mouth daily.    Historical Provider,  MD  atorvastatin (LIPITOR) 40 MG tablet Take 1 tablet (40 mg total) by mouth daily. 01/04/16   Susy Frizzle, MD  donepezil ([name]) 5 MG tablet Take 1 tablet (5 mg total) by mouth at bedtime. 10/07/14   Susy Frizzle, MD  Fluticasone-Salmeterol (ADVAIR) 250-50 MCG/DOSE AEPB Inhale 1 puff into the lungs every 12 (twelve) hours. 06/27/15   Tammy S Parrett, NP  linagliptin (TRADJENTA) 5 MG TABS tablet Take 1 tablet  (5 mg total) by mouth daily. 08/23/15   Susy Frizzle, MD  losartan (COZAAR) 100 MG tablet Take 1 tablet (100 mg total) by mouth daily. 10/21/15   Susy Frizzle, MD  pioglitazone (ACTOS) 30 MG tablet TAKE 1 TABLET (30 MG TOTAL) BY MOUTH DAILY. 12/08/15   Susy Frizzle, MD  traZODone (DESYREL) 50 MG tablet Take 0.5-1 tablets (25-50 mg total) by mouth at bedtime as needed for sleep. 12/27/15   Susy Frizzle, MD    Family History Family History  Problem Relation Age of Onset  . Hypertension Mother   . Hypertension Father   . Colon cancer Neg Hx     Social History Social History  Substance Use Topics  . Smoking status: Never Smoker  . Smokeless tobacco: Never Used  . Alcohol use No     Allergies   Ace inhibitors   Review of Systems Review of Systems  Constitutional: Negative for appetite change and fatigue.  HENT: Negative for congestion, ear discharge and sinus pressure.   Eyes: Negative for discharge.  Respiratory: Positive for cough, shortness of breath and wheezing.   Cardiovascular: Negative for chest pain.  Gastrointestinal: Negative for abdominal pain and diarrhea.  Genitourinary: Negative for frequency and hematuria.  Musculoskeletal: Negative for back pain.  Skin: Negative for rash.  Neurological: Negative for seizures and headaches.  Psychiatric/Behavioral: Negative for hallucinations.   Physical Exam Updated Vital Signs BP 135/83 (BP Location: Right Arm)   Pulse 90   Temp 98 F (36.7 C) (Oral)   Resp 24   Ht 6' (1.829 m)   Wt 215 lb (97.5 kg)   SpO2 91%   BMI 29.16 kg/m   Physical Exam  Constitutional: He is oriented to person, place, and time. He appears well-developed.  HENT:  Head: Normocephalic.  Eyes: Conjunctivae and EOM are normal. No scleral icterus.  Neck: Neck supple. No thyromegaly present.  Cardiovascular: Normal rate and regular rhythm.  Exam reveals no gallop and no friction rub.   No murmur heard. Pulmonary/Chest: No stridor.  He has wheezes. He has no rales. He exhibits no tenderness.  Minimal wheezing bilaterally  Abdominal: He exhibits no distension. There is no tenderness. There is no rebound.  Musculoskeletal: Normal range of motion. He exhibits no edema.  Lymphadenopathy:    He has no cervical adenopathy.  Neurological: He is oriented to person, place, and time. He exhibits normal muscle tone. Coordination normal.  Skin: No rash noted. No erythema.  Psychiatric: He has a normal mood and affect. His behavior is normal.   ED Treatments / Results  Labs (all labs ordered are listed, but only abnormal results are displayed) Labs Reviewed - No data to display  EKG  EKG Interpretation None       Radiology No results found.  Procedures Procedures  COORDINATION OF CARE: 11:46 AM Discussed next steps with pt. Pt verbalized understanding and is agreeable with the plan.    Medications Ordered in ED Medications - No data to display   Initial  Impression / Assessment and Plan / ED Course  I have reviewed the triage vital signs and the nursing notes.  Pertinent labs & imaging results that were available during my care of the patient were reviewed by me and considered in my medical decision making (see chart for details).  Clinical Course    Patient improved with treatment he will follow-up with his lung doctor tomorrow  The chart was scribed for me under my direct supervision.  I personally performed the history, physical, and medical decision making and all procedures in the evaluation of this patient..   Final Clinical Impressions(s) / ED Diagnoses   Final diagnoses:  None    New Prescriptions New Prescriptions   No medications on file     Milton Ferguson, MD 02/12/16 1457

## 2016-02-12 NOTE — Discharge Instructions (Signed)
Follow up with your lung doctor tomorrow as planned

## 2016-02-13 ENCOUNTER — Ambulatory Visit: Payer: Medicare Other | Admitting: Adult Health

## 2016-02-15 DIAGNOSIS — J449 Chronic obstructive pulmonary disease, unspecified: Secondary | ICD-10-CM | POA: Diagnosis not present

## 2016-02-15 DIAGNOSIS — J45909 Unspecified asthma, uncomplicated: Secondary | ICD-10-CM | POA: Diagnosis not present

## 2016-02-16 ENCOUNTER — Telehealth: Payer: Self-pay | Admitting: Emergency Medicine

## 2016-02-16 ENCOUNTER — Ambulatory Visit (INDEPENDENT_AMBULATORY_CARE_PROVIDER_SITE_OTHER): Payer: Medicare Other | Admitting: Adult Health

## 2016-02-16 ENCOUNTER — Encounter: Payer: Self-pay | Admitting: Adult Health

## 2016-02-16 DIAGNOSIS — J4521 Mild intermittent asthma with (acute) exacerbation: Secondary | ICD-10-CM | POA: Diagnosis not present

## 2016-02-16 MED ORDER — PREDNISONE 10 MG PO TABS: ORAL_TABLET | ORAL | 0 refills | Status: DC

## 2016-02-16 MED ORDER — DOXYCYCLINE HYCLATE 100 MG PO TABS: 100.0000 mg | ORAL_TABLET | Freq: Two times a day (BID) | ORAL | 0 refills | Status: DC

## 2016-02-16 NOTE — Progress Notes (Signed)
Subjective:    Patient ID: [name], male    DOB: 05-May-1936, 80 y.o.   MRN: [id number]  HPI 80 yo former smoker with Asthma and RLL lung mass  TEST  PET scan 03/2014 low uptake  CT chest 03/2015 RLL w/ slightly smaller.    02/16/2016 Acute OV  Patient presents for an acute office visit  He complains of prod cough with yellow mucus, occ sinus pressure/drainage, chest tightness, SOB and wheezing x5 days. Denies any fever, nasuea or vomiting. Pt would like to discuss going back to [place]. Patient denies any chest pain, orthopnea, PND, or increased leg swelling or hemoptysis  Went to ER on 8/6 for COPD flare, tx w/ albuterol neb. ? Solumedrol injrection . Did not discharge on steroids  .Stil not feeling better, has cough with discolored mucus. .  CXR showed chronic changes.   Has planned CT chest in 03/2016 to follow RLL nodule.   Past Medical History:  Diagnosis Date  . Anxiety   . COPD (chronic obstructive pulmonary disease) ([place])   . CVA (cerebral vascular accident) ([place])   . Dementia   . Diabetes mellitus   . Hx-TIA (transient ischemic attack)   . Hyperlipidemia   . Hypertension    Current Outpatient Prescriptions on File Prior to Visit  Medication Sig Dispense Refill  . albuterol (PROVENTIL HFA;VENTOLIN HFA) 108 (90 Base) MCG/ACT inhaler Inhale 2 puffs into the lungs every 4 (four) hours as needed for wheezing or shortness of breath. 1 Inhaler 3  . albuterol (PROVENTIL) (2.5 MG/3ML) 0.083% nebulizer solution USE 1 VIAL IN NEBULIZER EVERY 6 HOURS AS NEEDED FOR WHEEZING OR SHORTNESS OF BREATHE 75 mL 3  . aspirin EC 81 MG tablet Take 81 mg by mouth daily.    [name] Kitchen atorvastatin (LIPITOR) 40 MG tablet Take 1 tablet (40 mg total) by mouth daily. 90 tablet 3  . donepezil ([name]) 5 MG tablet Take 1 tablet (5 mg total) by mouth at bedtime. 30 tablet 5  . Fluticasone-Salmeterol (ADVAIR) 250-50 MCG/DOSE AEPB Inhale 1 puff into the lungs every 12 (twelve) hours. 60 each 11  .  linagliptin (TRADJENTA) 5 MG TABS tablet Take 1 tablet (5 mg total) by mouth daily. 30 tablet 2  . losartan (COZAAR) 100 MG tablet Take 1 tablet (100 mg total) by mouth daily. 90 tablet 3  . pioglitazone (ACTOS) 30 MG tablet TAKE 1 TABLET (30 MG TOTAL) BY MOUTH DAILY. 90 tablet 1  . traZODone (DESYREL) 50 MG tablet Take 0.5-1 tablets (25-50 mg total) by mouth at bedtime as needed for sleep. 30 tablet 3   No current facility-administered medications on file prior to visit.       Review of Systems Constitutional:   No  weight loss, night sweats,  Fevers, chills, +fatigue, or  lassitude.  HEENT:   No headaches,  Difficulty swallowing,  Tooth/dental problems, or  Sore throat,                No sneezing, itching, ear ache,  +nasal congestion, post nasal drip,   CV:  No chest pain,  Orthopnea, PND, swelling in lower extremities, anasarca, dizziness, palpitations, syncope.   GI  No heartburn, indigestion, abdominal pain, nausea, vomiting, diarrhea, change in bowel habits, loss of appetite, bloody stools.   Resp:  .  No chest wall deformity  Skin: no rash or lesions.  GU: no dysuria, change in color of urine, no urgency or frequency.  No flank pain, no hematuria  MS:  No joint pain or swelling.  No decreased range of motion.  No back pain.  Psych:  No change in mood or affect. No depression or anxiety.  No memory loss.         Objective:   Physical Exam GEN: A/Ox3; pleasant , NAD, elderly   Vitals:   02/16/16 1453  BP: 130/68  Pulse: 90  Temp: 98.2 F (36.8 C)  TempSrc: Oral  SpO2: 95%  Weight: 224 lb (101.6 kg)  Height: 6' (1.829 m)      HEENT:  [place]/AT,  EACs-clear, TMs-wnl, NOSE-clear, THROAT-clear, no lesions, no postnasal drip or exudate noted.   NECK:  Supple w/ fair ROM; no JVD; normal carotid impulses w/o bruits; no thyromegaly or nodules palpated; no lymphadenopathy.    RESP  Decreased BS in bases ,  no accessory muscle use, no dullness to percussion  CARD:   RRR, no m/r/g  , no peripheral edema, pulses intact, no cyanosis or clubbing.  GI:   Soft & nt; nml bowel sounds; no organomegaly or masses detected.   Musco: Warm bil, no deformities or joint swelling noted.   Neuro: alert, no focal deficits noted.    Skin: Warm, no lesions or rashes   Marcea Rojek NP-C  [facility]  02/16/2016

## 2016-02-16 NOTE — Assessment & Plan Note (Signed)
Flare with URI /Bronchitis   Plan  Doxycycline 100mg  Twice daily  For 7 days  Prednisone taper over next week.  Mucinex DM Twice daily  As needed  Cough/congesttion  Please contact office for sooner follow up if symptoms do not improve or worsen or seek emergency care  Follow up with Dr. Lamonte Sakai  In 4 weeks and As needed

## 2016-02-16 NOTE — Telephone Encounter (Signed)
Spoke with pt's wife and she states that pt has been up all night with SOB, wheeze and cough with lots of mucus (does not know color) and pain from coughing. She denies f/n/v. Pt has been using nebs without much symptom relief. Pt has been using Advair but is now wanting to change back to [place]. Pt's wife states that they have order due to come tomorrow from mail order pharmacy. Pt scheduled to see TP today @ 2:45pm. Nothing further needed.

## 2016-02-16 NOTE — Patient Instructions (Addendum)
Doxycycline 100mg  Twice daily  For 7 days  Prednisone taper over next week.  Mucinex DM Twice daily  As needed  Cough/congesttion  Please contact office for sooner follow up if symptoms do not improve or worsen or seek emergency care  Follow up with Dr. Lamonte Sakai  In 4 weeks and As needed

## 2016-02-17 ENCOUNTER — Ambulatory Visit (INDEPENDENT_AMBULATORY_CARE_PROVIDER_SITE_OTHER): Payer: Medicare Other | Admitting: Family Medicine

## 2016-02-17 ENCOUNTER — Encounter: Payer: Self-pay | Admitting: Family Medicine

## 2016-02-17 ENCOUNTER — Ambulatory Visit: Payer: Medicare Other | Admitting: Family Medicine

## 2016-02-17 VITALS — BP 120/70 | HR 66 | Temp 98.7°F | Resp 16 | Ht 70.0 in | Wt 224.0 lb

## 2016-02-17 DIAGNOSIS — J441 Chronic obstructive pulmonary disease with (acute) exacerbation: Secondary | ICD-10-CM

## 2016-02-17 DIAGNOSIS — E119 Type 2 diabetes mellitus without complications: Secondary | ICD-10-CM | POA: Diagnosis not present

## 2016-02-17 MED ORDER — METFORMIN HCL 1000 MG PO TABS: 1000.0000 mg | ORAL_TABLET | Freq: Two times a day (BID) | ORAL | 3 refills | Status: DC

## 2016-02-17 NOTE — Progress Notes (Signed)
Subjective:    Patient ID: [name], male    DOB: 08-10-1935, 80 y.o.   MRN: [id number]  HPI Patient was recently in the hospital for an asthma exacerbation/COPD exacerbation. Is usual, the patient had discontinued his Advair. When he prevents his preventative medication, he subsequently has an exacerbation. The reason he stopped his Advair because he cannot afford it. He is in the donut hole. Reviewing his medication list, the only other name brand medicine he is taking is [place].  Kidney function has been borderline in the past and due to diarrhea we have avoided metformin. However if this medicine is putting him in the donut hole this may be a cheaper alternative. He is out of Advair now and he is not taking it again. Instead he is using albuterol 3-4 times a day nebulizer to help his breathing his lungs are clear to auscultation bilaterally today. There are no wheezes crackles Rales. He is accounted by his daughter. Past Medical History:  Diagnosis Date  . Anxiety   . COPD (chronic obstructive pulmonary disease) ([place])   . CVA (cerebral vascular accident) ([place])   . Dementia   . Diabetes mellitus   . Hx-TIA (transient ischemic attack)   . Hyperlipidemia   . Hypertension    Past Surgical History:  Procedure Laterality Date  . CHOLECYSTECTOMY N/A 02/23/2013   Procedure: LAPAROSCOPIC CHOLECYSTECTOMY;  Surgeon: Jamesetta So, MD;  Location: [facility];  Service: General;  Laterality: N/A;  . ERCP N/A 02/24/2013   Procedure: ENDOSCOPIC RETROGRADE CHOLANGIOPANCREATOGRAPHY;  Surgeon: Daneil Dolin, MD;  Location: [facility];  Service: Endoscopy;  Laterality: N/A;  . ROTATOR CUFF REPAIR     Current Outpatient Prescriptions on File Prior to Visit  Medication Sig Dispense Refill  . albuterol (PROVENTIL HFA;VENTOLIN HFA) 108 (90 Base) MCG/ACT inhaler Inhale 2 puffs into the lungs every 4 (four) hours as needed for wheezing or shortness of breath. 1 Inhaler 3  . albuterol (PROVENTIL) (2.5 MG/3ML)  0.083% nebulizer solution USE 1 VIAL IN NEBULIZER EVERY 6 HOURS AS NEEDED FOR WHEEZING OR SHORTNESS OF BREATHE 75 mL 3  . aspirin EC 81 MG tablet Take 81 mg by mouth daily.    [name] Kitchen atorvastatin (LIPITOR) 40 MG tablet Take 1 tablet (40 mg total) by mouth daily. 90 tablet 3  . donepezil ([name]) 5 MG tablet Take 1 tablet (5 mg total) by mouth at bedtime. 30 tablet 5  . doxycycline (VIBRA-TABS) 100 MG tablet Take 1 tablet (100 mg total) by mouth 2 (two) times daily. 14 tablet 0  . Fluticasone-Salmeterol (ADVAIR) 250-50 MCG/DOSE AEPB Inhale 1 puff into the lungs every 12 (twelve) hours. 60 each 11  . linagliptin (TRADJENTA) 5 MG TABS tablet Take 1 tablet (5 mg total) by mouth daily. 30 tablet 2  . losartan (COZAAR) 100 MG tablet Take 1 tablet (100 mg total) by mouth daily. 90 tablet 3  . pioglitazone (ACTOS) 30 MG tablet TAKE 1 TABLET (30 MG TOTAL) BY MOUTH DAILY. 90 tablet 1  . traZODone (DESYREL) 50 MG tablet Take 0.5-1 tablets (25-50 mg total) by mouth at bedtime as needed for sleep. 30 tablet 3   No current facility-administered medications on file prior to visit.    Allergies  Allergen Reactions  . Ace Inhibitors Swelling   Social History   Social History  . Marital status: Married    Spouse name: [name]  . Number of children: 6  . Years of education: 11th   Occupational History  .  Retired    Social History Main Topics  . Smoking status: Never Smoker  . Smokeless tobacco: Never Used  . Alcohol use No  . Drug use: No  . Sexual activity: Not on file   Other Topics Concern  . Not on file   Social History Narrative   Patient lives at home with family.   Caffeine Use: 1 cup of coffee       Review of Systems  All other systems reviewed and are negative.      Objective:   Physical Exam  Cardiovascular: Normal rate, regular rhythm and normal heart sounds.   Pulmonary/Chest: Effort normal. He has no wheezes. He has no rales. He exhibits no tenderness.  Abdominal: Soft.  Bowel sounds are normal. He exhibits no distension. There is no tenderness. There is no rebound.  Musculoskeletal: He exhibits no edema.  Vitals reviewed.         Assessment & Plan:  Controlled type 2 diabetes mellitus without complication, without long-term current use of insulin ([place]) - Plan: metFORMIN (GLUCOPHAGE) 1000 MG tablet  COPD exacerbation ([place]) Patient is planning to transfer to [facility] just because it is closer to his home. He is no longer able to drive and is to be 0 the family to make appointments. I wished him the best of luck. I thoroughly understand this. However I urged compliance. The patient needs to be compliant with taking his preventative medication to control his asthma/COPD. I have no samples of Advair today to give them that I do have samples of Brea. I'll start the patient on Breo one inhalation a day and discontinue Advair. I will discontinue Tradjenta and replace with metformin 1000 mg twice daily for cost reasons. Hopefully this will help avoid the doughnut hole quite is early to he can then afford his breathing medication for longer periods of time throughout the year. Also encouraged his daughter to contact his insurance to see if there is a cheaper preventative medication. Whether it's Advair, Breo, symbicort or dulera, I would be fine with him taking any of these medications as long as he takes the medication consistently on a daily basis

## 2016-02-22 ENCOUNTER — Ambulatory Visit: Payer: Medicare Other | Admitting: Adult Health

## 2016-03-14 ENCOUNTER — Inpatient Hospital Stay: Admission: RE | Admit: 2016-03-14 | Payer: Medicare Other | Source: Ambulatory Visit

## 2016-03-28 ENCOUNTER — Ambulatory Visit (INDEPENDENT_AMBULATORY_CARE_PROVIDER_SITE_OTHER): Payer: Medicare Other

## 2016-03-28 ENCOUNTER — Ambulatory Visit (INDEPENDENT_AMBULATORY_CARE_PROVIDER_SITE_OTHER)
Admission: RE | Admit: 2016-03-28 | Discharge: 2016-03-28 | Disposition: A | Discharge status: Home / Self Care | Payer: Medicare Other | Source: Ambulatory Visit | Attending: Emergency Medicine | Admitting: Emergency Medicine

## 2016-03-28 DIAGNOSIS — Z23 Encounter for immunization: Secondary | ICD-10-CM | POA: Diagnosis not present

## 2016-03-28 DIAGNOSIS — J9811 Atelectasis: Secondary | ICD-10-CM

## 2016-04-02 DIAGNOSIS — H25812 Combined forms of age-related cataract, left eye: Secondary | ICD-10-CM | POA: Diagnosis not present

## 2016-04-02 DIAGNOSIS — E119 Type 2 diabetes mellitus without complications: Secondary | ICD-10-CM | POA: Diagnosis not present

## 2016-04-02 DIAGNOSIS — H11002 Unspecified pterygium of left eye: Secondary | ICD-10-CM | POA: Diagnosis not present

## 2016-04-02 DIAGNOSIS — H35363 Drusen (degenerative) of macula, bilateral: Secondary | ICD-10-CM | POA: Diagnosis not present

## 2016-04-02 DIAGNOSIS — H25813 Combined forms of age-related cataract, bilateral: Secondary | ICD-10-CM | POA: Diagnosis not present

## 2016-04-24 NOTE — Patient Instructions (Addendum)
Your procedure is scheduled on: 04/30/2016  Report to [facility] at  47   AM.  Call this number if you have problems the morning of surgery: [phone]   Do not eat food or drink liquids :After Midnight.      Take these medicines the morning of surgery with A SIP OF WATER: cozaar. Take your nebulizer before you come and bring your inhaler with you.   Do not wear jewelry, make-up or nail polish.  Do not wear lotions, powders, or perfumes. You may wear deodorant.  Do not shave 48 hours prior to surgery.  Do not bring valuables to the hospital.  Contacts, dentures or bridgework may not be worn into surgery.  Leave suitcase in the car. After surgery it may be brought to your room.  For patients admitted to the hospital, checkout time is 11:00 AM the day of discharge.   Patients discharged the day of surgery will not be allowed to drive home.  :     Please read over the following fact sheets that you were given: Coughing and Deep Breathing, Surgical Site Infection Prevention, Anesthesia Post-op Instructions and Care and Recovery After Surgery    Cataract A cataract is a clouding of the lens of the eye. When a lens becomes cloudy, vision is reduced based on the degree and nature of the clouding. Many cataracts reduce vision to some degree. Some cataracts make people more near-sighted as they develop. Other cataracts increase glare. Cataracts that are ignored and become worse can sometimes look white. The white color can be seen through the pupil. CAUSES   Aging. However, cataracts may occur at any age, even in newborns.   Certain drugs.   Trauma to the eye.   Certain diseases such as diabetes.   Specific eye diseases such as chronic inflammation inside the eye or a sudden attack of a rare form of glaucoma.   Inherited or acquired medical problems.  SYMPTOMS   Gradual, progressive drop in vision in the affected eye.   Severe, rapid visual loss. This most often happens when trauma  is the cause.  DIAGNOSIS  To detect a cataract, an eye doctor examines the lens. Cataracts are best diagnosed with an exam of the eyes with the pupils enlarged (dilated) by drops.  TREATMENT  For an early cataract, vision may improve by using different eyeglasses or stronger lighting. If that does not help your vision, surgery is the only effective treatment. A cataract needs to be surgically removed when vision loss interferes with your everyday activities, such as driving, reading, or watching TV. A cataract may also have to be removed if it prevents examination or treatment of another eye problem. Surgery removes the cloudy lens and usually replaces it with a substitute lens (intraocular lens, IOL).  At a time when both you and your doctor agree, the cataract will be surgically removed. If you have cataracts in both eyes, only one is usually removed at a time. This allows the operated eye to heal and be out of danger from any possible problems after surgery (such as infection or poor wound healing). In rare cases, a cataract may be doing damage to your eye. In these cases, your caregiver may advise surgical removal right away. The vast majority of people who have cataract surgery have better vision afterward. HOME CARE INSTRUCTIONS  If you are not planning surgery, you may be asked to do the following:  Use different eyeglasses.   Use stronger or brighter  lighting.   Ask your eye doctor about reducing your medicine dose or changing medicines if it is thought that a medicine caused your cataract. Changing medicines does not make the cataract go away on its own.   Become familiar with your surroundings. Poor vision can lead to injury. Avoid bumping into things on the affected side. You are at a higher risk for tripping or falling.   Exercise extreme care when driving or operating machinery.   Wear sunglasses if you are sensitive to bright light or experiencing problems with glare.  SEEK  IMMEDIATE MEDICAL CARE IF:   You have a worsening or sudden vision loss.   You notice redness, swelling, or increasing pain in the eye.   You have a fever.  Document Released: 06/25/2005 Document Revised: 06/14/2011 Document Reviewed: 02/16/2011 [facility] Patient Information 2012 [place].PATIENT INSTRUCTIONS POST-ANESTHESIA  IMMEDIATELY FOLLOWING SURGERY:  Do not drive or operate machinery for the first twenty four hours after surgery.  Do not make any important decisions for twenty four hours after surgery or while taking narcotic pain medications or sedatives.  If you develop intractable nausea and vomiting or a severe headache please notify your doctor immediately.  FOLLOW-UP:  Please make an appointment with your surgeon as instructed. You do not need to follow up with anesthesia unless specifically instructed to do so.  WOUND CARE INSTRUCTIONS (if applicable):  Keep a dry clean dressing on the anesthesia/puncture wound site if there is drainage.  Once the wound has quit draining you may leave it open to air.  Generally you should leave the bandage intact for twenty four hours unless there is drainage.  If the epidural site drains for more than 36-48 hours please call the anesthesia department.  QUESTIONS?:  Please feel free to call your physician or the hospital operator if you have any questions, and they will be happy to assist you.

## 2016-04-25 ENCOUNTER — Encounter (HOSPITAL_COMMUNITY)
Admission: RE | Admit: 2016-04-25 | Discharge: 2016-04-25 | Disposition: A | Discharge status: Home / Self Care | Payer: Medicare Other | Source: Ambulatory Visit | Attending: Ophthalmology | Admitting: Ophthalmology

## 2016-04-27 ENCOUNTER — Encounter (HOSPITAL_COMMUNITY)
Admission: RE | Admit: 2016-04-27 | Discharge: 2016-04-27 | Disposition: A | Discharge status: Home / Self Care | Payer: Medicare Other | Source: Ambulatory Visit | Attending: Ophthalmology | Admitting: Ophthalmology

## 2016-04-27 ENCOUNTER — Encounter (HOSPITAL_COMMUNITY): Payer: Self-pay

## 2016-04-27 DIAGNOSIS — F419 Anxiety disorder, unspecified: Secondary | ICD-10-CM | POA: Diagnosis not present

## 2016-04-27 DIAGNOSIS — F0390 Unspecified dementia without behavioral disturbance: Secondary | ICD-10-CM | POA: Diagnosis not present

## 2016-04-27 DIAGNOSIS — H25812 Combined forms of age-related cataract, left eye: Secondary | ICD-10-CM | POA: Diagnosis present

## 2016-04-27 DIAGNOSIS — J449 Chronic obstructive pulmonary disease, unspecified: Secondary | ICD-10-CM | POA: Diagnosis not present

## 2016-04-27 DIAGNOSIS — Z8673 Personal history of transient ischemic attack (TIA), and cerebral infarction without residual deficits: Secondary | ICD-10-CM | POA: Diagnosis not present

## 2016-04-27 DIAGNOSIS — Z79899 Other long term (current) drug therapy: Secondary | ICD-10-CM | POA: Diagnosis not present

## 2016-04-27 DIAGNOSIS — E119 Type 2 diabetes mellitus without complications: Secondary | ICD-10-CM | POA: Diagnosis not present

## 2016-04-27 DIAGNOSIS — I10 Essential (primary) hypertension: Secondary | ICD-10-CM | POA: Diagnosis not present

## 2016-04-27 LAB — BASIC METABOLIC PANEL
Anion gap: 8 (ref 5–15)
BUN: 11 mg/dL (ref 6–20)
CALCIUM: 8.8 mg/dL — AB (ref 8.9–10.3)
CO2: 23 mmol/L (ref 22–32)
CREATININE: 1.27 mg/dL — AB (ref 0.61–1.24)
Chloride: 106 mmol/L (ref 101–111)
GFR calc Af Amer: >60 mL/min (ref >60)
GFR calc non Af Amer: 52 mL/min — ABNORMAL LOW (ref >60)
GLUCOSE: 207 mg/dL — AB (ref 65–99)
Potassium: 3.7 mmol/L (ref 3.5–5.1)
SODIUM: 137 mmol/L (ref 135–145)

## 2016-04-27 LAB — CBC WITH DIFFERENTIAL/PLATELET
BASOS PCT: 0 %
Basophils Absolute: 0 10*3/uL (ref 0.0–0.1)
EOS ABS: 0.3 10*3/uL (ref 0.0–0.7)
EOS PCT: 4 %
HCT: 34.8 % — ABNORMAL LOW (ref 39.0–52.0)
Hemoglobin: 12 g/dL — ABNORMAL LOW (ref 13.0–17.0)
Lymphocytes Relative: 27 %
Lymphs Abs: 1.9 10*3/uL (ref 0.7–4.0)
MCH: 30.2 pg (ref 26.0–34.0)
MCHC: 34.5 g/dL (ref 30.0–36.0)
MCV: 87.7 fL (ref 78.0–100.0)
MONO ABS: 0.4 10*3/uL (ref 0.1–1.0)
MONOS PCT: 6 %
Neutro Abs: 4.5 10*3/uL (ref 1.7–7.7)
Neutrophils Relative %: 63 %
Platelets: 220 10*3/uL (ref 150–400)
RBC: 3.97 MIL/uL — ABNORMAL LOW (ref 4.22–5.81)
RDW: 12.8 % (ref 11.5–15.5)
WBC: 7.1 10*3/uL (ref 4.0–10.5)

## 2016-04-30 ENCOUNTER — Ambulatory Visit (HOSPITAL_COMMUNITY)
Admission: RE | Admit: 2016-04-30 | Discharge: 2016-04-30 | Disposition: A | Discharge status: Home / Self Care | Payer: Medicare Other | Source: Ambulatory Visit | Attending: Ophthalmology | Admitting: Ophthalmology

## 2016-04-30 ENCOUNTER — Ambulatory Visit (HOSPITAL_COMMUNITY): Payer: Medicare Other | Admitting: Anesthesiology

## 2016-04-30 ENCOUNTER — Encounter (HOSPITAL_COMMUNITY): Payer: Self-pay | Admitting: Anesthesiology

## 2016-04-30 ENCOUNTER — Encounter (HOSPITAL_COMMUNITY)
Admission: RE | Disposition: A | Discharge status: Home / Self Care | Payer: Self-pay | Source: Ambulatory Visit | Attending: Ophthalmology

## 2016-04-30 DIAGNOSIS — F419 Anxiety disorder, unspecified: Secondary | ICD-10-CM | POA: Insufficient documentation

## 2016-04-30 DIAGNOSIS — I10 Essential (primary) hypertension: Secondary | ICD-10-CM | POA: Insufficient documentation

## 2016-04-30 DIAGNOSIS — Z8673 Personal history of transient ischemic attack (TIA), and cerebral infarction without residual deficits: Secondary | ICD-10-CM | POA: Insufficient documentation

## 2016-04-30 DIAGNOSIS — E119 Type 2 diabetes mellitus without complications: Secondary | ICD-10-CM | POA: Insufficient documentation

## 2016-04-30 DIAGNOSIS — Z79899 Other long term (current) drug therapy: Secondary | ICD-10-CM | POA: Insufficient documentation

## 2016-04-30 DIAGNOSIS — J449 Chronic obstructive pulmonary disease, unspecified: Secondary | ICD-10-CM | POA: Diagnosis not present

## 2016-04-30 DIAGNOSIS — H25812 Combined forms of age-related cataract, left eye: Secondary | ICD-10-CM | POA: Diagnosis not present

## 2016-04-30 DIAGNOSIS — F0390 Unspecified dementia without behavioral disturbance: Secondary | ICD-10-CM | POA: Insufficient documentation

## 2016-04-30 HISTORY — PX: CATARACT EXTRACTION W/PHACO: SHX586

## 2016-04-30 LAB — GLUCOSE, CAPILLARY: Glucose-Capillary: 217 mg/dL — ABNORMAL HIGH (ref 65–99)

## 2016-04-30 SURGERY — PHACOEMULSIFICATION, CATARACT, WITH IOL INSERTION
Anesthesia: Monitor Anesthesia Care | Site: Eye | Laterality: Left

## 2016-04-30 MED ORDER — EPINEPHRINE PF 1 MG/ML IJ SOLN
INTRAMUSCULAR | Status: AC
  Filled 2016-04-30: qty 1

## 2016-04-30 MED ORDER — LIDOCAINE HCL 3.5 % OP GEL
1.0000 "application " | Freq: Once | OPHTHALMIC | Status: AC
  Administered 2016-04-30: 1 via OPHTHALMIC

## 2016-04-30 MED ORDER — EPINEPHRINE PF 1 MG/ML IJ SOLN
INTRAMUSCULAR | Status: DC | PRN
  Administered 2016-04-30: 500 mL

## 2016-04-30 MED ORDER — TETRACAINE HCL 0.5 % OP SOLN
1.0000 [drp] | OPHTHALMIC | Status: AC
  Administered 2016-04-30 (×3): 1 [drp] via OPHTHALMIC

## 2016-04-30 MED ORDER — EPINEPHRINE PF 1 MG/ML IJ SOLN
INTRAMUSCULAR | Status: AC
Start: 2016-04-30 — End: 2016-04-30
  Filled 2016-04-30: qty 1

## 2016-04-30 MED ORDER — FENTANYL CITRATE (PF) 100 MCG/2ML IJ SOLN
25.0000 ug | INTRAMUSCULAR | Status: AC | PRN
  Administered 2016-04-30 (×2): 25 ug via INTRAVENOUS

## 2016-04-30 MED ORDER — NEOMYCIN-POLYMYXIN-DEXAMETH 3.5-10000-0.1 OP SUSP
OPHTHALMIC | Status: DC | PRN
  Administered 2016-04-30: 2 [drp] via OPHTHALMIC

## 2016-04-30 MED ORDER — CYCLOPENTOLATE-PHENYLEPHRINE 0.2-1 % OP SOLN
1.0000 [drp] | OPHTHALMIC | Status: AC
  Administered 2016-04-30 (×3): 1 [drp] via OPHTHALMIC

## 2016-04-30 MED ORDER — LACTATED RINGERS IV SOLN
INTRAVENOUS | Status: DC
Start: 2016-04-30 — End: 2016-04-30
  Administered 2016-04-30: 10:00:00 via INTRAVENOUS

## 2016-04-30 MED ORDER — FENTANYL CITRATE (PF) 100 MCG/2ML IJ SOLN
INTRAMUSCULAR | Status: AC
  Filled 2016-04-30: qty 2

## 2016-04-30 MED ORDER — BSS IO SOLN
INTRAOCULAR | Status: DC | PRN
  Administered 2016-04-30: 15 mL

## 2016-04-30 MED ORDER — PROVISC 10 MG/ML IO SOLN
INTRAOCULAR | Status: DC | PRN
  Administered 2016-04-30: 0.85 mL via INTRAOCULAR

## 2016-04-30 MED ORDER — MIDAZOLAM HCL 2 MG/2ML IJ SOLN
1.0000 mg | INTRAMUSCULAR | Status: DC | PRN
  Administered 2016-04-30: 2 mg via INTRAVENOUS

## 2016-04-30 MED ORDER — POVIDONE-IODINE 5 % OP SOLN
OPHTHALMIC | Status: DC | PRN
  Administered 2016-04-30: 1 via OPHTHALMIC

## 2016-04-30 MED ORDER — PHENYLEPHRINE HCL 2.5 % OP SOLN
1.0000 [drp] | OPHTHALMIC | Status: AC
  Administered 2016-04-30 (×3): 1 [drp] via OPHTHALMIC

## 2016-04-30 MED ORDER — LIDOCAINE HCL (PF) 1 % IJ SOLN
INTRAOCULAR | Status: DC | PRN
  Administered 2016-04-30: .9 mL via OPHTHALMIC

## 2016-04-30 MED ORDER — MIDAZOLAM HCL 2 MG/2ML IJ SOLN
INTRAMUSCULAR | Status: AC
  Filled 2016-04-30: qty 2

## 2016-04-30 SURGICAL SUPPLY — 11 items
CLOTH BEACON ORANGE TIMEOUT ST (SAFETY) ×2 IMPLANT
EYE SHIELD UNIVERSAL CLEAR (GAUZE/BANDAGES/DRESSINGS) ×2 IMPLANT
GLOVE BIOGEL PI IND STRL 6.5 (GLOVE) IMPLANT
GLOVE BIOGEL PI INDICATOR 6.5 (GLOVE) ×2
GLOVE EXAM NITRILE MD LF STRL (GLOVE) ×2 IMPLANT
PAD ARMBOARD 7.5X6 YLW CONV (MISCELLANEOUS) ×2 IMPLANT
SIGHTPATH CAT PROC W REG LENS (Ophthalmic Related) ×3 IMPLANT
SYRINGE LUER LOK 1CC (MISCELLANEOUS) ×2 IMPLANT
TAPE SURG TRANSPORE 1 IN (GAUZE/BANDAGES/DRESSINGS) IMPLANT
TAPE SURGICAL TRANSPORE 1 IN (GAUZE/BANDAGES/DRESSINGS) ×2
WATER STERILE IRR 250ML POUR (IV SOLUTION) ×2 IMPLANT

## 2016-04-30 NOTE — Anesthesia Postprocedure Evaluation (Signed)
Anesthesia Post Note  Patient: RHONAN SCOBEY  Procedure(s) Performed: Procedure(s) (LRB): CATARACT EXTRACTION PHACO AND INTRAOCULAR LENS PLACEMENT LEFT EYE CDE=9.83 (Left)  Patient location during evaluation: Short Stay Anesthesia Type: MAC Level of consciousness: awake and alert and oriented Pain management: pain level controlled Vital Signs Assessment: post-procedure vital signs reviewed and stable Respiratory status: spontaneous breathing Cardiovascular status: stable Postop Assessment: no signs of nausea or vomiting Anesthetic complications: no    Last Vitals:  Vitals:   04/30/16 1015 04/30/16 1018  BP:  (!) 144/74  Pulse:    Resp: 17 (!) 21  Temp:      Last Pain:  Vitals:   04/30/16 0936  TempSrc: Oral                 ADAMS, AMY A

## 2016-04-30 NOTE — H&P (Signed)
I have reviewed the H&P, the patient was re-examined, and I have identified no interval changes in medical condition and plan of care since the history and physical of record  

## 2016-04-30 NOTE — Discharge Instructions (Signed)

## 2016-04-30 NOTE — Op Note (Signed)
Date of Admission: 04/30/2016  Date of Surgery: 04/30/2016   Pre-Op Dx: Cataract Left Eye  Post-Op Dx: Senile Combined Cataract Left  Eye,  Dx Code [id number]  Surgeon: Tonny Branch, M.D.  Assistants: None  Anesthesia: Topical with MAC  Indications: Painless, progressive loss of vision with compromise of daily activities.  Surgery: Cataract Extraction with Intraocular lens Implant Left Eye  Discription: The patient had dilating drops and viscous lidocaine placed into the Left eye in the pre-op holding area. After transfer to the operating room, a time out was performed. The patient was then prepped and draped. Beginning with a 75 degree blade a paracentesis port was made at the surgeon's 2 o'clock position. The anterior chamber was then filled with 1% non-preserved lidocaine. This was followed by filling the anterior chamber with Provisc.  A 2.50mm keratome blade was used to make a clear corneal incision at the temporal limbus.  A bent cystatome needle was used to create a continuous tear capsulotomy. Hydrodissection was performed with balanced salt solution on a Fine canula. The lens nucleus was then removed using the phacoemulsification handpiece. Residual cortex was removed with the I&A handpiece. The anterior chamber and capsular bag were refilled with Provisc. A posterior chamber intraocular lens was placed into the capsular bag with it's injector. The implant was positioned with the Kuglan hook. The Provisc was then removed from the anterior chamber and capsular bag with the I&A handpiece. Stromal hydration of the main incision and paracentesis port was performed with BSS on a Fine canula. The wounds were tested for leak which was negative. The patient tolerated the procedure well. There were no operative complications. The patient was then transferred to the recovery room in stable condition.  Complications: None  Specimen: None  EBL: None  Prosthetic device: Hoya iSert 250, power 19.0 D,  SN [id number].

## 2016-04-30 NOTE — Anesthesia Procedure Notes (Signed)
Procedure Name: MAC Date/Time: 04/30/2016 10:20 AM Performed by: Andree Elk, Oryn Casanova A Pre-anesthesia Checklist: Patient identified, Suction available, Patient being monitored, Emergency Drugs available and Timeout performed Oxygen Delivery Method: Nasal cannula

## 2016-04-30 NOTE — Transfer of Care (Signed)
Immediate Anesthesia Transfer of Care Note  Patient: [name]  Procedure(s) Performed: Procedure(s) with comments: CATARACT EXTRACTION PHACO AND INTRAOCULAR LENS PLACEMENT LEFT EYE CDE=9.83 (Left) - left  Patient Location: Short Stay  Anesthesia Type:MAC  Level of Consciousness: awake, alert , oriented and patient cooperative  Airway & Oxygen Therapy: Patient Spontanous Breathing  Post-op Assessment: Report given to RN and Post -op Vital signs reviewed and stable  Post vital signs: Reviewed and stable  Last Vitals:  Vitals:   04/30/16 1015 04/30/16 1018  BP:  (!) 144/74  Pulse:    Resp: 17 (!) 21  Temp:      Last Pain:  Vitals:   04/30/16 0936  TempSrc: Oral      Patients Stated Pain Goal: 5 ([id number])  Complications: No apparent anesthesia complications

## 2016-04-30 NOTE — Anesthesia Preprocedure Evaluation (Signed)
Anesthesia Evaluation  Patient identified by MRN, date of birth, ID band Patient awake    Reviewed: Allergy & Precautions, NPO status , Patient's Chart, lab work & pertinent test results  Airway Mallampati: III  TM Distance: >3 FB Neck ROM: Full    Dental  (+) Teeth Intact   Pulmonary asthma , COPD,    breath sounds clear to auscultation       Cardiovascular hypertension, Pt. on medications  Rhythm:Regular Rate:Normal     Neuro/Psych PSYCHIATRIC DISORDERS Anxiety Dementia  TIACVA    GI/Hepatic   Endo/Other  diabetes, Type 2  Renal/GU      Musculoskeletal   Abdominal   Peds  Hematology   Anesthesia Other Findings   Reproductive/Obstetrics                             Anesthesia Physical Anesthesia Plan  ASA: III  Anesthesia Plan: MAC   Post-op Pain Management:    Induction: Intravenous  Airway Management Planned: Nasal Cannula  Additional Equipment:   Intra-op Plan:   Post-operative Plan:   Informed Consent: I have reviewed the patients History and Physical, chart, labs and discussed the procedure including the risks, benefits and alternatives for the proposed anesthesia with the patient or authorized representative who has indicated his/her understanding and acceptance.     Plan Discussed with:   Anesthesia Plan Comments:         Anesthesia Quick Evaluation

## 2016-05-03 ENCOUNTER — Encounter (HOSPITAL_COMMUNITY): Payer: Self-pay | Admitting: Ophthalmology

## 2016-05-19 ENCOUNTER — Other Ambulatory Visit: Payer: Self-pay | Admitting: Family Medicine

## 2016-05-23 ENCOUNTER — Encounter: Payer: Self-pay | Admitting: Pediatrics

## 2016-05-23 ENCOUNTER — Ambulatory Visit (INDEPENDENT_AMBULATORY_CARE_PROVIDER_SITE_OTHER): Payer: Medicare Other | Admitting: Pediatrics

## 2016-05-23 VITALS — BP 128/86 | HR 70 | Temp 97.5°F | Ht 72.0 in | Wt 217.2 lb

## 2016-05-23 DIAGNOSIS — I6930 Unspecified sequelae of cerebral infarction: Secondary | ICD-10-CM | POA: Diagnosis not present

## 2016-05-23 DIAGNOSIS — I10 Essential (primary) hypertension: Secondary | ICD-10-CM

## 2016-05-23 DIAGNOSIS — E119 Type 2 diabetes mellitus without complications: Secondary | ICD-10-CM | POA: Diagnosis not present

## 2016-05-23 LAB — BAYER DCA HB A1C WAIVED: HB A1C (BAYER DCA - WAIVED): 8.2 % — ABNORMAL HIGH (ref <7.0)

## 2016-05-23 MED ORDER — METFORMIN HCL ER 500 MG PO TB24: 500.0000 mg | ORAL_TABLET | Freq: Every day | ORAL | 4 refills | Status: DC

## 2016-05-23 NOTE — Progress Notes (Signed)
  Subjective:   Patient ID: [name], male    DOB: 1935-12-08, 80 y.o.   MRN: [id number] CC: New Patient (Initial Visit) and Dementia  HPI: [name] is a 80 y.o. male presenting for New Patient (Initial Visit) and Dementia  Insomnia: Takes trazodone a couple times a week  COPD: Breathing has been fine, not needing albuterol  H/o CVA: on aspirin daily, statin Feels like he has a hard time remembering short term  Better memory for long term Feels like usual self  DM2: on oral medications  HTN: no HA, no chest pain, no SOB Takes med regularly  Past Medical History:  Diagnosis Date  . Anxiety   . COPD (chronic obstructive pulmonary disease) ([place])   . CVA (cerebral vascular accident) ([place]) 2013   loss of memory  . Dementia   . Diabetes mellitus   . Hx-TIA (transient ischemic attack)   . Hyperlipidemia   . Hypertension    Family History  Problem Relation Age of Onset  . Hypertension Mother   . Hypertension Father   . Colon cancer Neg Hx    Social History   Social History  . Marital status: Married    Spouse name: [name]  . Number of children: 6  . Years of education: 11th   Occupational History  . Retired    Social History Main Topics  . Smoking status: Never Smoker  . Smokeless tobacco: Never Used  . Alcohol use No  . Drug use: No  . Sexual activity: No   Other Topics Concern  . None   Social History Narrative   Patient lives at home with family.   Caffeine Use: 1 cup of coffee   ROS: All systems negative other than what is in HPI  Objective:    BP 128/86   Pulse 70   Temp 97.5 F (36.4 C) (Oral)   Ht 6' (1.829 m)   Wt 217 lb 3.2 oz (98.5 kg)   BMI 29.46 kg/m   Wt Readings from Last 3 Encounters:  05/23/16 217 lb 3.2 oz (98.5 kg)  04/27/16 220 lb (99.8 kg)  02/17/16 224 lb (101.6 kg)    Gen: NAD, alert, cooperative with exam, NCAT EYES: EOMI, no conjunctival injection, or no icterus CV: NRRR, normal S1/S2 Resp: CTABL, normal  WOB Abd: +BS, soft, NTND. Ext: No edema, warm, foot exam normal Neuro: Alert and oriented MSK: normal muscle bulk  Assessment & Plan:  Samee was seen today for new patient (initial visit) and dementia.  Diagnoses and all orders for this visit:  Type 2 diabetes mellitus without complication, without long-term current use of insulin ([place]) Due for repeat A1c Foot exam normal today Cont metformin -     Bayer DCA Hb A1c Waived -     metFORMIN (GLUCOPHAGE-XR) 500 MG 24 hr tablet; Take 1 tablet (500 mg total) by mouth daily with breakfast.  Essential hypertension Well controlled today, cont current medication  Late effect of cerebrovascular accident (CVA) Takes aspirin daily Having more memory problems lately, has good insight into memory problems  Follow up plan: Return in about 2 months (around 07/23/2016). Assunta Found, MD [place]

## 2016-05-29 ENCOUNTER — Ambulatory Visit: Payer: Medicare Other | Admitting: Emergency Medicine

## 2016-06-04 ENCOUNTER — Other Ambulatory Visit: Payer: Self-pay | Admitting: Pediatrics

## 2016-06-04 NOTE — Telephone Encounter (Signed)
Patients wife aware that we are out of samples.

## 2016-06-22 ENCOUNTER — Encounter: Payer: Self-pay | Admitting: Emergency Medicine

## 2016-06-22 ENCOUNTER — Ambulatory Visit (INDEPENDENT_AMBULATORY_CARE_PROVIDER_SITE_OTHER): Payer: Medicare Other | Admitting: Emergency Medicine

## 2016-06-22 DIAGNOSIS — J9811 Atelectasis: Secondary | ICD-10-CM

## 2016-06-22 DIAGNOSIS — J45909 Unspecified asthma, uncomplicated: Secondary | ICD-10-CM | POA: Diagnosis not present

## 2016-06-22 MED ORDER — FLUTICASONE FUROATE-VILANTEROL 100-25 MCG/INH IN AEPB: 1.0000 | INHALATION_SPRAY | Freq: Every day | RESPIRATORY_TRACT | 5 refills | Status: DC

## 2016-06-22 NOTE — Patient Instructions (Addendum)
Please continue your Memory Dance once a day.  Keep albuterol available to use 2 puffs up to every 4 hours if needed for shortness of breath.  You will need a repeat CT scan of the chest in September of 2018.  Follow with Dr Lamonte Sakai in 6 months or sooner if you have any problems

## 2016-06-22 NOTE — Progress Notes (Signed)
Subjective:    Patient ID: [name], male    DOB: 01/01/1936, 80 y.o.   MRN: [id number]  Asthma  He complains of cough. There is no shortness of breath or wheezing. Associated symptoms include sneezing. Pertinent negatives include no appetite change, ear pain, fever, headaches, postnasal drip, rhinorrhea, sore throat or trouble swallowing. His past medical history is significant for asthma.   80 yo former smoker (very remote), hx of HTN, DM, CVA. Carries the dx of COPD / Asthma although never smoker. He was admitted to [facility] with slowly progressive dyspnea mid-May. Found to have a R pleural effusion > thora  Pleural fluid 11/25/13 > alb 3.0, TP 5.3, glu 162, LDH 159.  Cytology negative, WBC 1378 predominant lymphs no PMN, cx negative  ROV 01/29/14 -- follows for a R effusion and abnormal CT scan chest. The pleural effusion was well drained on 6/18, allows visualization of a rounded RLL opacity that looks like Atx, consider also malignancy but less likely. The repeat cytology is negative for malignancy. Returns today with a CXR. He improved with the thora and has not declined since then.   I think at this point we can follow him for re-accumulation. It the effusion returns then we will likely pursue repeat thora + biopsy or pleuroscopy. He may merit FOB or VATS to sample the RLL region of consolidation. If the fluid does not reaccumulate then I will plan to repeat his Ct scan in 2-3 months. Please schedule him to come back to see me in 2-3 weeks with a CXR.  ROV 04/01/14 -- R effusion and abnormal area of rounded RLL infiltrate. Repeat CT scan 9/16 >> the RLL opacity is more rounded, has caused some basilar volume loss. No clear mass seen. He has felt well - has had cough and associated dyspnea. He relates this to missing his Advair. No sputum or hemoptysis  ROV 04/21/14 -- follow up for RLL rounded infiltrate vs mass. His PET scan was reassuring - The RLL lesion looks like rounded atelectasis.    ROV 03/31/15 -- , follow-up visit for minimal tobacco abuse but with signs and symptoms suggestive of COPD/asthma. We have been following an area of rounded airspace opacity in the right lower lobe. His most recent CT scan of the chest was 03/25/15 that I have reviewed personally. It showed that the rounded area is slightly smaller then his CT scan and PET scan a year ago. Most consistent with rounded atelectasis.    ROV 11/24/15 -- patient with a history of COPD/asthma and an abnormal CT scan of the chest with rounded airspace disease in his right lower lobe. We have followed this with serial scans also a PET scan. On last evaluation it was smaller, likely needs a repeat scan in September 2017. He has been treated at least twice for acute exacerbation since our last visit. Also note that he has had some anemia that's being evaluated by Dr Dennard Schaumann.  He is on Advair 250, takes reliably. He uses albuterol about once daily, sometimes not at all. He is not very active, able to exert some. He walks a mile before stopping to rest.   ROV 06/22/16 -- this follow-up visit for patient with a history of suspected fixed asthma/ COPD (no PFT), right lower lobe rounded density that we have been following with serial imaging. Most recent CT scan of the chest was 03/28/16 that I have personally reviewed. This shows a stable small loculated right effusion, stable area of  rounded atelectasis in the right lower lobe 5.5 x 4.1 cm. He was treated for an acute exacerbation of his COPD in August with steroids and antibiotics. He is currently managed on breo, changed to this by Dr Dennard Schaumann. He feels that he is doing better, especially on this medication. His exertional tolerance is better.     Review of Systems  Constitutional: Negative for appetite change and fever.  HENT: Positive for sneezing. Negative for congestion, dental problem, ear pain, nosebleeds, postnasal drip, rhinorrhea, sinus pressure, sore throat and trouble  swallowing.   Eyes: Negative for redness and itching.  Respiratory: Positive for cough. Negative for chest tightness, shortness of breath and wheezing.   Cardiovascular: Negative for palpitations and leg swelling.  Gastrointestinal: Negative for nausea and vomiting.  Genitourinary: Negative for dysuria.  Musculoskeletal: Negative for joint swelling.  Skin: Negative for rash.  Neurological: Negative for headaches.  Hematological: Does not bruise/bleed easily.  Psychiatric/Behavioral: Negative for dysphoric mood. The patient is not nervous/anxious.        Objective:   Physical Exam Vitals:   06/22/16 1100  BP: (!) 144/70  BP Location: Left Arm  Cuff Size: Normal  Pulse: 70  SpO2: 98%  Weight: 213 lb (96.6 kg)  Height: 6' (1.829 m)   Gen: Pleasant, well-nourished, in no distress,  normal affect  ENT: No lesions,  mouth clear,  oropharynx clear, no postnasal drip  Neck: No JVD, no TMG, no carotid bruits  Lungs: No use of accessory muscles, clear without rales or rhonchi, decreased at the R base, no crackles.   Cardiovascular: RRR, heart sounds normal, no murmur or gallops, no peripheral edema  Musculoskeletal: No deformities, no cyanosis or clubbing  Neuro: alert, non focal  Skin: Warm, no lesions or rashes     Assessment & Plan:  Atelectasis of right lung Stable on CT scan of the chest from September 2017. Most consistent with an atelectasis. We will repeat the CT scan of the chest in one year.  Intrinsic asthma No pulmonary function testing available but he has had clinical asthma. He is improved since the addition of Breo to his meds. We will continue this. Albuterol prn.   Baltazar Apo, MD, PhD 06/22/2016, 11:20 AM [facility] [phone] or if no answer [phone]

## 2016-06-22 NOTE — Assessment & Plan Note (Signed)
Stable on CT scan of the chest from September 2017. Most consistent with an atelectasis. We will repeat the CT scan of the chest in one year.

## 2016-06-22 NOTE — Assessment & Plan Note (Signed)
No pulmonary function testing available but he has had clinical asthma. He is improved since the addition of Breo to his meds. We will continue this. Albuterol prn.

## 2016-07-04 ENCOUNTER — Encounter: Payer: Self-pay | Admitting: Family Medicine

## 2016-07-04 ENCOUNTER — Ambulatory Visit (INDEPENDENT_AMBULATORY_CARE_PROVIDER_SITE_OTHER): Payer: Medicare Other | Admitting: Family Medicine

## 2016-07-04 VITALS — BP 139/63 | HR 70 | Temp 97.9°F | Ht 72.0 in | Wt 214.2 lb

## 2016-07-04 DIAGNOSIS — S65292A Other specified injury of superficial palmar arch of left hand, initial encounter: Secondary | ICD-10-CM | POA: Diagnosis not present

## 2016-07-04 DIAGNOSIS — Z23 Encounter for immunization: Secondary | ICD-10-CM

## 2016-07-04 DIAGNOSIS — W503XXA Accidental bite by another person, initial encounter: Secondary | ICD-10-CM

## 2016-07-04 DIAGNOSIS — F0150 Vascular dementia without behavioral disturbance: Secondary | ICD-10-CM | POA: Diagnosis not present

## 2016-07-04 MED ORDER — CLINDAMYCIN HCL 300 MG PO CAPS: 300.0000 mg | ORAL_CAPSULE | Freq: Three times a day (TID) | ORAL | 0 refills | Status: DC

## 2016-07-04 MED ORDER — TETANUS-DIPHTHERIA TOXOIDS TD 2-2 LF/0.5ML IM SUSP: 0.5000 mL | Freq: Once | INTRAMUSCULAR | 0 refills | Status: DC
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
## 2016-07-04 NOTE — Assessment & Plan Note (Signed)
Patient would like to try medication for it, gave sample for Namzaric and will have him follow-up with PCP in a month

## 2016-08-06 ENCOUNTER — Ambulatory Visit: Payer: Medicare Other | Admitting: Family Medicine

## 2016-08-07 ENCOUNTER — Encounter: Payer: Self-pay | Admitting: Family Medicine

## 2016-08-07 ENCOUNTER — Ambulatory Visit (INDEPENDENT_AMBULATORY_CARE_PROVIDER_SITE_OTHER): Payer: Medicare Other

## 2016-08-07 ENCOUNTER — Ambulatory Visit (INDEPENDENT_AMBULATORY_CARE_PROVIDER_SITE_OTHER): Payer: Medicare Other | Admitting: Family Medicine

## 2016-08-07 VITALS — BP 136/70 | HR 74 | Temp 98.4°F | Ht 73.83 in | Wt 207.2 lb

## 2016-08-07 DIAGNOSIS — R059 Cough, unspecified: Secondary | ICD-10-CM

## 2016-08-07 DIAGNOSIS — J45909 Unspecified asthma, uncomplicated: Secondary | ICD-10-CM | POA: Diagnosis not present

## 2016-08-07 DIAGNOSIS — R05 Cough: Secondary | ICD-10-CM

## 2016-08-07 MED ORDER — ALBUTEROL SULFATE (2.5 MG/3ML) 0.083% IN NEBU: 2.5000 mg | INHALATION_SOLUTION | Freq: Four times a day (QID) | RESPIRATORY_TRACT | 3 refills | Status: DC | PRN

## 2016-08-07 MED ORDER — ALBUTEROL SULFATE (2.5 MG/3ML) 0.083% IN NEBU
2.5000 mg | INHALATION_SOLUTION | Freq: Once | RESPIRATORY_TRACT | Status: AC
  Administered 2016-08-07: 2.5 mg via RESPIRATORY_TRACT

## 2016-08-07 NOTE — Patient Instructions (Signed)
Great to meet you!  We will call with x ray results   Come back with any concerns  Use albuterol every 4-6 hours as needed. (take at least 2-3 times a day)
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
## 2016-08-09 ENCOUNTER — Telehealth: Payer: Self-pay | Admitting: Family Medicine

## 2016-08-10 NOTE — Telephone Encounter (Signed)
rx sent, pt aware 

## 2016-09-05 ENCOUNTER — Telehealth: Payer: Self-pay | Admitting: Pediatrics

## 2016-09-05 NOTE — Telephone Encounter (Signed)
appt scheduled Pt notified 

## 2016-09-06 ENCOUNTER — Encounter: Payer: Self-pay | Admitting: Family Medicine

## 2016-09-06 ENCOUNTER — Ambulatory Visit (INDEPENDENT_AMBULATORY_CARE_PROVIDER_SITE_OTHER): Payer: Medicare Other | Admitting: Family Medicine

## 2016-09-06 VITALS — BP 139/82 | HR 71 | Temp 97.5°F | Ht 73.83 in | Wt 205.4 lb

## 2016-09-06 DIAGNOSIS — F0150 Vascular dementia without behavioral disturbance: Secondary | ICD-10-CM

## 2016-09-06 MED ORDER — MEMANTINE HCL-DONEPEZIL HCL ER 28-10 MG PO CP24: 1.0000 | ORAL_CAPSULE | Freq: Every day | ORAL | 2 refills | Status: DC

## 2016-09-06 NOTE — Patient Instructions (Signed)
Great to see you!  Come back in 1 month for check up for memory loss  After the pack I gave you, start the pill from the pharmacy, namzaric

## 2016-09-10 NOTE — Progress Notes (Signed)
   HPI  Patient presents today to discuss dementia.  State that he's had issues for several months. He has episodes of forgetfulness of what is been doing that day, getting lost in places that he knows his way around.  Patient is able to dress himself, walk and ambulate without a problem, bathe himself.  He tried namzeric without a problem and states that he would like to retry again. His wife and he denies any side effects from the medication.   PMH: Smoking status noted ROS: Per HPI  Objective: BP 139/82   Pulse 71   Temp 97.5 F (36.4 C) (Oral)   Ht 6' 1.83" (1.875 m)   Wt 205 lb 6.4 oz (93.2 kg)   BMI 26.49 kg/m  Gen: NAD, alert, cooperative with exam HEENT: NCAT CV: RRR, good S1/S2, no murmur Resp: CTABL, no wheezes, non-labored Abd: SNTND, BS present, no guarding or organomegaly Ext: No edema, warm Neuro: Alert and oriented, No gross deficits  MMSE - Mini Mental State Exam 09/06/2016  Orientation to time 1  Orientation to Place 4  Registration 3  Attention/ Calculation 5  Recall 1  Language- name 2 objects 2  Language- repeat 1  Language- follow 3 step command 3  Language- read & follow direction 1  Write a sentence 1  Copy design 0  Total score 22     Assessment and plan:  # Dementia Worsening, likely vascular dementia Start namzeric, previously on aricept.  Reports good tolerance with previous titration pack. Titration pack given in clinic.  Discussed expectations for meds for dementia.     Meds ordered this encounter  Medications  . Memantine HCl-Donepezil HCl (NAMZARIC) 28-10 MG CP24    Sig: Take 1 tablet by mouth daily.    Dispense:  30 capsule    Refill:  [place], MD [facility] 09/10/2016, 10:15 AM

## 2016-09-24 ENCOUNTER — Other Ambulatory Visit: Payer: Self-pay | Admitting: *Deleted

## 2016-09-24 DIAGNOSIS — E119 Type 2 diabetes mellitus without complications: Secondary | ICD-10-CM

## 2016-09-24 MED ORDER — METFORMIN HCL ER 500 MG PO TB24: 500.0000 mg | ORAL_TABLET | Freq: Every day | ORAL | 0 refills | Status: DC

## 2016-10-15 ENCOUNTER — Telehealth: Payer: Self-pay | Admitting: Pediatrics

## 2016-11-02 ENCOUNTER — Telehealth: Payer: Self-pay | Admitting: Pediatrics

## 2016-11-02 NOTE — Telephone Encounter (Signed)
[name] states that the metformin has been giving pt diarrhea. When returning call [name] states they have already figured out the problem- he was supposed to take it with a meal and he has not. Patient is going to try to take it with a meal for a few days and see if that helps and will let us know if it does not.

## 2016-11-09 ENCOUNTER — Ambulatory Visit (INDEPENDENT_AMBULATORY_CARE_PROVIDER_SITE_OTHER): Payer: Medicare Other | Admitting: Pediatrics

## 2016-11-09 ENCOUNTER — Telehealth: Payer: Self-pay | Admitting: Pediatrics

## 2016-11-09 ENCOUNTER — Encounter: Payer: Self-pay | Admitting: Pediatrics

## 2016-11-09 VITALS — BP 136/80 | HR 72 | Temp 97.4°F | Ht 73.83 in | Wt 204.0 lb

## 2016-11-09 DIAGNOSIS — R197 Diarrhea, unspecified: Secondary | ICD-10-CM | POA: Diagnosis not present

## 2016-11-09 DIAGNOSIS — E785 Hyperlipidemia, unspecified: Secondary | ICD-10-CM

## 2016-11-09 DIAGNOSIS — I10 Essential (primary) hypertension: Secondary | ICD-10-CM | POA: Diagnosis not present

## 2016-11-09 DIAGNOSIS — E119 Type 2 diabetes mellitus without complications: Secondary | ICD-10-CM

## 2016-11-09 LAB — BAYER DCA HB A1C WAIVED: HB A1C: 6.4 % (ref <7.0)

## 2016-11-09 MED ORDER — ONETOUCH DELICA LANCETS FINE MISC: 1.0000 | Freq: Two times a day (BID) | 12 refills | Status: DC

## 2016-11-09 MED ORDER — ONETOUCH VERIO W/DEVICE KIT: 1.0000 | PACK | Freq: Two times a day (BID) | 0 refills | Status: DC

## 2016-11-09 MED ORDER — GLUCOSE BLOOD VI STRP: ORAL_STRIP | 12 refills | Status: DC

## 2016-11-09 MED ORDER — BLOOD GLUCOSE MONITOR KIT
PACK | 0 refills | Status: DC
Start: 2016-11-09 — End: 2020-04-12

## 2016-11-09 NOTE — Telephone Encounter (Signed)
Call given to nurse °

## 2016-11-09 NOTE — Patient Instructions (Addendum)
Stop metformin to see if this helps with diarrhea Average blood sugar is good today. Avoid sugary foods, drinks, candy and desserts  Goal sugar level < 150 in the morning Check in the morning and before dinner Bring to next clinic visit  If diarrhea does not improve over next few weeks come back to be seen

## 2016-11-09 NOTE — Addendum Note (Signed)
Addended by: Wardell Heath on: 11/09/2016 03:48 PM   Modules accepted: Orders

## 2016-11-09 NOTE — Progress Notes (Signed)
  Subjective:   Patient ID: AYODEJI KEIMIG, male    DOB: 18-Sep-1935, 81 y.o.   MRN: [id number] CC: Follow-up (6 month)  HPI: ERICA OSUNA is a 81 y.o. male presenting for Follow-up (6 month)  Here today with his daughter  DM2: feels like metformin causing diarrhea every day Does eat some sweets regularly On metformin alone Having dark colored stools, loose sometimes multiple times a day Taking peptobismal, carofate Not helping much with diarrhea  HLD: stable  COPD: stable, daughter says his breathing has been fine, not needing albuterol  Relevant past medical, surgical, family and social history reviewed. Allergies and medications reviewed and updated. History  Smoking Status  . Never Smoker  Smokeless Tobacco  . Never Used   ROS: Per HPI   Objective:    BP 136/80   Pulse 72   Temp 97.4 F (36.3 C) (Oral)   Ht 6' 1.83" (1.875 m)   Wt 204 lb (92.5 kg)   BMI 26.31 kg/m   Wt Readings from Last 3 Encounters:  11/09/16 204 lb (92.5 kg)  09/06/16 205 lb 6.4 oz (93.2 kg)  08/07/16 207 lb 3.2 oz (94 kg)    Gen: NAD, alert, cooperative with exam, NCAT EYES: EOMI, no conjunctival injection, or no icterus ENT:OP without erythema LYMPH: no cervical LAD CV: NRRR, normal S1/S2, no murmur Resp: CTABL, no wheezes, normal WOB Abd: +BS, soft, NT Ext: No edema, warm Neuro: Alert and oriented  Assessment & Plan:  Jamond was seen today for follow-up.  Diagnoses and all orders for this visit:  Type 2 diabetes mellitus without complication, without long-term current use of insulin (HCC) A1c 6.4 Metformin causing diarrhea Will try dc metformin, check BGLs regularly, let me know if elevated Decrease sugary food intake -     Bayer DCA Hb A1c Waived -     Microalbumin / creatinine urine ratio -     Lipid panel -     CMP14+EGFR  Essential hypertension Well controlled, on losartan, continue -     Bayer DCA Hb A1c Waived -     Microalbumin / creatinine urine ratio -      Lipid panel -     CMP14+EGFR -     CBC with Differential  Hyperlipidemia, unspecified hyperlipidemia type On atorvastatin, continue -     Bayer DCA Hb A1c Waived -     Microalbumin / creatinine urine ratio -     Lipid panel -     CMP14+EGFR  Diarrhea, unspecified type Likely due to metformin, dark color from OTC stomach meds If not improving with dc of metformin will refer to GI CBC today  Follow up plan: Return in about 3 months (around 02/09/2017). Assunta Found, MD [place]

## 2016-11-10 LAB — CBC WITH DIFFERENTIAL/PLATELET
Basophils Absolute: 0 10*3/uL (ref 0.0–0.2)
Basos: 1 %
EOS (ABSOLUTE): 0.3 10*3/uL (ref 0.0–0.4)
Eos: 4 %
Hematocrit: 36 % — ABNORMAL LOW (ref 37.5–51.0)
Hemoglobin: 12.3 g/dL — ABNORMAL LOW (ref 13.0–17.7)
IMMATURE GRANULOCYTES: 0 %
Immature Grans (Abs): 0 10*3/uL (ref 0.0–0.1)
LYMPHS ABS: 2 10*3/uL (ref 0.7–3.1)
Lymphs: 31 %
MCH: 30.2 pg (ref 26.6–33.0)
MCHC: 34.2 g/dL (ref 31.5–35.7)
MCV: 89 fL (ref 79–97)
MONOS ABS: 0.4 10*3/uL (ref 0.1–0.9)
Monocytes: 7 %
NEUTROS PCT: 57 %
Neutrophils Absolute: 3.5 10*3/uL (ref 1.4–7.0)
PLATELETS: 200 10*3/uL (ref 150–379)
RBC: 4.07 x10E6/uL — AB (ref 4.14–5.80)
RDW: 13.8 % (ref 12.3–15.4)
WBC: 6.2 10*3/uL (ref 3.4–10.8)

## 2016-11-10 LAB — CMP14+EGFR
A/G RATIO: 1.7 (ref 1.2–2.2)
ALBUMIN: 4.3 g/dL (ref 3.5–4.8)
ALK PHOS: 88 IU/L (ref 39–117)
ALT: 13 IU/L (ref 0–44)
AST: 17 IU/L (ref 0–40)
BILIRUBIN TOTAL: 0.3 mg/dL (ref 0.0–1.2)
BUN / CREAT RATIO: 13 (ref 10–24)
BUN: 16 mg/dL (ref 8–27)
CHLORIDE: 104 mmol/L (ref 96–106)
CO2: 21 mmol/L (ref 18–29)
Calcium: 8.7 mg/dL (ref 8.6–10.2)
Creatinine, Ser: 1.25 mg/dL (ref 0.76–1.27)
GFR calc Af Amer: 63 mL/min/{1.73_m2} (ref >59)
GFR calc non Af Amer: 55 mL/min/{1.73_m2} — ABNORMAL LOW (ref >59)
GLOBULIN, TOTAL: 2.6 g/dL (ref 1.5–4.5)
Glucose: 125 mg/dL — ABNORMAL HIGH (ref 65–99)
POTASSIUM: 3.9 mmol/L (ref 3.5–5.2)
SODIUM: 144 mmol/L (ref 134–144)
Total Protein: 6.9 g/dL (ref 6.0–8.5)

## 2016-11-10 LAB — MICROALBUMIN / CREATININE URINE RATIO
Creatinine, Urine: 141.1 mg/dL
Microalb/Creat Ratio: 36.7 mg/g creat — ABNORMAL HIGH (ref 0.0–30.0)
Microalbumin, Urine: 51.8 ug/mL

## 2016-11-10 LAB — LIPID PANEL
CHOLESTEROL TOTAL: 79 mg/dL — AB (ref 100–199)
Chol/HDL Ratio: 2.5 ratio (ref 0.0–5.0)
HDL: 32 mg/dL — AB (ref >39)
LDL CALC: 19 mg/dL (ref 0–99)
TRIGLYCERIDES: 142 mg/dL (ref 0–149)
VLDL CHOLESTEROL CAL: 28 mg/dL (ref 5–40)

## 2017-01-08 ENCOUNTER — Other Ambulatory Visit: Payer: Self-pay | Admitting: Nurse Practitioner

## 2017-01-08 ENCOUNTER — Other Ambulatory Visit: Payer: Self-pay | Admitting: Family Medicine

## 2017-02-21 ENCOUNTER — Encounter: Payer: Self-pay | Admitting: Pediatrics

## 2017-02-21 ENCOUNTER — Ambulatory Visit (INDEPENDENT_AMBULATORY_CARE_PROVIDER_SITE_OTHER): Payer: Medicare Other | Admitting: Pediatrics

## 2017-02-21 VITALS — BP 128/75 | HR 79 | Temp 98.7°F | Ht 73.83 in | Wt 206.0 lb

## 2017-02-21 DIAGNOSIS — I10 Essential (primary) hypertension: Secondary | ICD-10-CM | POA: Diagnosis not present

## 2017-02-21 DIAGNOSIS — F0150 Vascular dementia without behavioral disturbance: Secondary | ICD-10-CM

## 2017-02-21 DIAGNOSIS — G4700 Insomnia, unspecified: Secondary | ICD-10-CM

## 2017-02-21 DIAGNOSIS — E785 Hyperlipidemia, unspecified: Secondary | ICD-10-CM

## 2017-02-21 DIAGNOSIS — E119 Type 2 diabetes mellitus without complications: Secondary | ICD-10-CM

## 2017-02-21 LAB — BAYER DCA HB A1C WAIVED: HB A1C (BAYER DCA - WAIVED): 8.2 % — ABNORMAL HIGH (ref <7.0)

## 2017-02-21 MED ORDER — LOSARTAN POTASSIUM 50 MG PO TABS
50.0000 mg | ORAL_TABLET | Freq: Every day | ORAL | 1 refills | Status: DC
Start: 2017-02-21 — End: 2017-06-12

## 2017-02-21 MED ORDER — ATORVASTATIN CALCIUM 40 MG PO TABS: 40.0000 mg | ORAL_TABLET | Freq: Every day | ORAL | 1 refills | Status: DC

## 2017-02-21 NOTE — Progress Notes (Signed)
  Subjective:   Patient ID: [name], male    DOB: 11-08-1935, 81 y.o.   MRN: [id number] CC: Follow-up (3 month) med problems HPI: [name] is a 81 y.o. male presenting for Follow-up (3 month)  Here today with his wife DM2: BGLs in the morning up to the 200s off of metformin Eating raisin cookies regularly, hersheys chocolates  HLD: stable, takes statin daily  Memory problems: was on namzeric, wife says it made him a zombie so they stopped. Long term memory is ok Short term memory he has problems with  HTN: taking meds His wife gives his meds to him No CP, no SOB Breathing has been fine, not recently needed albuterol  Relevant past medical, surgical, family and social history reviewed. Allergies and medications reviewed and updated. History  Smoking Status  . Never Smoker  Smokeless Tobacco  . Never Used   ROS: Per HPI   Objective:    BP 128/75   Pulse 79   Temp 98.7 F (37.1 C) (Oral)   Ht 6' 1.83" (1.875 m)   Wt 206 lb (93.4 kg)   BMI 26.57 kg/m   Wt Readings from Last 3 Encounters:  02/21/17 206 lb (93.4 kg)  11/09/16 204 lb (92.5 kg)  09/06/16 205 lb 6.4 oz (93.2 kg)    Gen: NAD, alert, cooperative with exam, NCAT EYES: EOMI, no conjunctival injection, or no icterus ENT:  TMs pearly gray b/l, OP without erythema LYMPH: no cervical LAD CV: NRRR, normal S1/S2, no murmur, distal pulses 2+ b/l Resp: CTABL, no wheezes, normal WOB Abd: +BS, soft, NTND. no guarding or organomegaly Ext: No edema, warm Neuro: Alert and oriented, answers questions appropriately, strength equal b/l UE and LE, coordination grossly normal MSK: normal muscle bulk  Assessment & Plan:  [name] was seen today for follow-up med problems.  Diagnoses and all orders for this visit:  Type 2 diabetes mellitus without complication, without long-term current use of insulin (HCC) Off of meds now, was on metformin alone, caused diarrhea a1c last 6.4, repeat today Pt agrees to cut back  on sugary snacks -     Bayer DCA Hb A1c Waived  Essential hypertension Well controlled, cont below -     losartan (COZAAR) 50 MG tablet; Take 1 tablet (50 mg total) by mouth daily.  Hyperlipidemia, unspecified hyperlipidemia type Stable, cont below -     atorvastatin (LIPITOR) 40 MG tablet; Take 1 tablet (40 mg total) by mouth daily.  Vascular dementia without behavioral disturbance Stable, tried medication, didn't like s/e  Insomnia, unspecified type Takes trazodone as needed, not nightly   Follow up plan: Return in about 3 months (around 05/24/2017) for follow up DM2. Assunta Found, MD [place]

## 2017-02-22 ENCOUNTER — Telehealth: Payer: Self-pay | Admitting: Pediatrics

## 2017-02-22 LAB — BASIC METABOLIC PANEL
BUN / CREAT RATIO: 11 (ref 10–24)
BUN: 16 mg/dL (ref 8–27)
CHLORIDE: 107 mmol/L — AB (ref 96–106)
CO2: 20 mmol/L (ref 20–29)
Calcium: 9.1 mg/dL (ref 8.6–10.2)
Creatinine, Ser: 1.44 mg/dL — ABNORMAL HIGH (ref 0.76–1.27)
GFR calc Af Amer: 53 mL/min/{1.73_m2} — ABNORMAL LOW (ref >59)
GFR calc non Af Amer: 46 mL/min/{1.73_m2} — ABNORMAL LOW (ref >59)
GLUCOSE: 153 mg/dL — AB (ref 65–99)
Potassium: 4.1 mmol/L (ref 3.5–5.2)
Sodium: 143 mmol/L (ref 134–144)

## 2017-02-22 NOTE — Telephone Encounter (Signed)
Patient's wife (linda) aware of lab results

## 2017-03-29 ENCOUNTER — Encounter: Payer: Self-pay | Admitting: *Deleted

## 2017-04-13 ENCOUNTER — Other Ambulatory Visit: Payer: Self-pay | Admitting: Pediatrics

## 2017-04-22 ENCOUNTER — Emergency Department (HOSPITAL_COMMUNITY): Payer: Medicare Other

## 2017-04-22 ENCOUNTER — Encounter (HOSPITAL_COMMUNITY): Payer: Self-pay

## 2017-04-22 ENCOUNTER — Emergency Department (HOSPITAL_COMMUNITY)
Admission: EM | Admit: 2017-04-22 | Discharge: 2017-04-22 | Disposition: A | Discharge status: Home / Self Care | Payer: Medicare Other | Attending: Emergency Medicine | Admitting: Emergency Medicine

## 2017-04-22 DIAGNOSIS — J449 Chronic obstructive pulmonary disease, unspecified: Secondary | ICD-10-CM | POA: Insufficient documentation

## 2017-04-22 DIAGNOSIS — Z7982 Long term (current) use of aspirin: Secondary | ICD-10-CM | POA: Diagnosis not present

## 2017-04-22 DIAGNOSIS — Z8673 Personal history of transient ischemic attack (TIA), and cerebral infarction without residual deficits: Secondary | ICD-10-CM | POA: Insufficient documentation

## 2017-04-22 DIAGNOSIS — I10 Essential (primary) hypertension: Secondary | ICD-10-CM | POA: Diagnosis not present

## 2017-04-22 DIAGNOSIS — Z7984 Long term (current) use of oral hypoglycemic drugs: Secondary | ICD-10-CM | POA: Insufficient documentation

## 2017-04-22 DIAGNOSIS — E119 Type 2 diabetes mellitus without complications: Secondary | ICD-10-CM | POA: Diagnosis not present

## 2017-04-22 DIAGNOSIS — E785 Hyperlipidemia, unspecified: Secondary | ICD-10-CM | POA: Diagnosis not present

## 2017-04-22 DIAGNOSIS — F0390 Unspecified dementia without behavioral disturbance: Secondary | ICD-10-CM | POA: Insufficient documentation

## 2017-04-22 DIAGNOSIS — R079 Chest pain, unspecified: Secondary | ICD-10-CM | POA: Diagnosis present

## 2017-04-22 DIAGNOSIS — Z79899 Other long term (current) drug therapy: Secondary | ICD-10-CM | POA: Diagnosis not present

## 2017-04-22 DIAGNOSIS — R072 Precordial pain: Secondary | ICD-10-CM | POA: Insufficient documentation

## 2017-04-22 DIAGNOSIS — R0602 Shortness of breath: Secondary | ICD-10-CM | POA: Diagnosis not present

## 2017-04-22 LAB — BASIC METABOLIC PANEL
ANION GAP: 10 (ref 5–15)
BUN: 11 mg/dL (ref 6–20)
CALCIUM: 8.6 mg/dL — AB (ref 8.9–10.3)
CO2: 23 mmol/L (ref 22–32)
Chloride: 103 mmol/L (ref 101–111)
Creatinine, Ser: 1.3 mg/dL — ABNORMAL HIGH (ref 0.61–1.24)
GFR calc non Af Amer: 51 mL/min — ABNORMAL LOW (ref >60)
GFR, EST AFRICAN AMERICAN: 59 mL/min — AB (ref >60)
Glucose, Bld: 217 mg/dL — ABNORMAL HIGH (ref 65–99)
Potassium: 3.8 mmol/L (ref 3.5–5.1)
Sodium: 136 mmol/L (ref 135–145)

## 2017-04-22 LAB — CBC
HCT: 39.9 % (ref 39.0–52.0)
Hemoglobin: 14.1 g/dL (ref 13.0–17.0)
MCH: 30.4 pg (ref 26.0–34.0)
MCHC: 35.3 g/dL (ref 30.0–36.0)
MCV: 86 fL (ref 78.0–100.0)
PLATELETS: 167 10*3/uL (ref 150–400)
RBC: 4.64 MIL/uL (ref 4.22–5.81)
RDW: 12.7 % (ref 11.5–15.5)
WBC: 9.9 10*3/uL (ref 4.0–10.5)

## 2017-04-22 LAB — TROPONIN I: Troponin I: <0.03 ng/mL (ref <0.03)

## 2017-04-22 NOTE — ED Triage Notes (Signed)
Patient reports of sitting on porch approx. 30 minutes ago and started having chest pain and shortness of breath. Patient reports of pain radiating into right and left shoulder. Denies pain at this time.

## 2017-04-22 NOTE — Discharge Instructions (Signed)
Follow-up with her primary care doctor. Return for any new or worse symptoms. Today's workup had 2 negative troponins. Chest x-ray was normal. Oxygen saturation is normal. No evidence of any acute findings.

## 2017-04-22 NOTE — ED Provider Notes (Signed)
[facility] EMERGENCY DEPARTMENT Provider Note   CSN: [id number] Arrival date & time: 04/22/17  1524     History   Chief Complaint Chief Complaint  Patient presents with  . Chest Pain    HPI [name] is a 81 y.o. male.  Patient reports while sitting on his porch 30 minutes prior to arrival started having anterior chest pain in the middle of his chest and shortness of breath. Shortly after arrival here all symptoms resolved. Patient stated the pain went into the right and left shoulder area. Patient with no history of any known cardiac abnormalities. Past medical history significant for COPD hypertension diabetes hyperlipidemia. Has had a history of cerebrovascular accident in 2013. Patient has not had a stress test or cardiac catheterization. [facility] [place].      Past Medical History:  Diagnosis Date  . Anxiety   . COPD (chronic obstructive pulmonary disease) ([place])   . CVA (cerebral vascular accident) ([place]) 2013   loss of memory  . Dementia   . Diabetes mellitus   . Hx-TIA (transient ischemic attack)   . Hyperlipidemia   . Hypertension     Patient Active Problem List   Diagnosis Date Noted  . Intrinsic asthma 03/31/2015  . Dementia   . Atelectasis of right lung 01/29/2014  . Pleural effusion on right 11/24/2013  . Late effect of cerebrovascular accident (CVA)   . Urinary retention 02/27/2013  . Cholecystitis, acute 02/24/2013  . Choledocholithiasis 02/24/2013  . Cholelithiasis 02/21/2013  . DM (diabetes mellitus) ([place]) 02/21/2013  . Diabetes mellitus   . Hypertension   . Hyperlipidemia   . Hx-TIA (transient ischemic attack)     Past Surgical History:  Procedure Laterality Date  . CATARACT EXTRACTION W/PHACO Left 04/30/2016   Procedure: CATARACT EXTRACTION PHACO AND INTRAOCULAR LENS PLACEMENT LEFT EYE CDE=9.83;  Surgeon: Tonny Branch, MD;  Location: [facility];  Service: Ophthalmology;  Laterality: Left;  left    . CHOLECYSTECTOMY N/A 02/23/2013   Procedure: LAPAROSCOPIC CHOLECYSTECTOMY;  Surgeon: Jamesetta So, MD;  Location: [facility];  Service: General;  Laterality: N/A;  . ERCP N/A 02/24/2013   Procedure: ENDOSCOPIC RETROGRADE CHOLANGIOPANCREATOGRAPHY;  Surgeon: Daneil Dolin, MD;  Location: [facility];  Service: Endoscopy;  Laterality: N/A;  . ROTATOR CUFF REPAIR         Home Medications    Prior to Admission medications   Medication Sig Start Date End Date Taking? Authorizing Provider  albuterol (PROVENTIL) (2.5 MG/3ML) 0.083% nebulizer solution Take 3 mLs (2.5 mg total) by nebulization every 6 (six) hours as needed for wheezing or shortness of breath. 08/07/16  Yes Timmothy Euler, MD  aspirin EC 81 MG tablet Take 162 mg by mouth daily.    Yes [provider]  atorvastatin (LIPITOR) 40 MG tablet TAKE 1 TABLET BY MOUTH EVERY DAY 04/15/17  Yes Eustaquio Maize, MD  fluticasone furoate-vilanterol (BREO ELLIPTA) 100-25 MCG/INH AEPB Inhale 1 puff into the lungs daily. 06/22/16  Yes Collene Gobble, MD  losartan (COZAAR) 50 MG tablet Take 1 tablet (50 mg total) by mouth daily. 02/21/17  Yes Eustaquio Maize, MD  metFORMIN (GLUCOPHAGE) 1000 MG tablet Take 1,000 mg by mouth 2 (two) times daily with a meal.   Yes [provider]  atorvastatin (LIPITOR) 40 MG tablet Take 1 tablet (40 mg total) by mouth daily. Patient not taking: Reported on 04/22/2017 02/21/17   Eustaquio Maize, MD  blood glucose  meter kit and supplies KIT Daily. Onetouch Verio 11/09/16   Eustaquio Maize, MD  Blood Glucose Monitoring Suppl (ONETOUCH VERIO) w/Device KIT 1 kit by Does not apply route 2 (two) times daily. 11/09/16   Eustaquio Maize, MD  glucose blood (ONETOUCH VERIO) test strip TWICE DAILY. E11.9 11/09/16   Eustaquio Maize, MD  [facility] DELICA LANCETS FINE MISC 1 each by Does not apply route 2 (two) times daily. E11.9 11/09/16   Eustaquio Maize, MD    Family History Family History  Problem Relation Age of  Onset  . Hypertension Mother   . Hypertension Father   . Colon cancer Neg Hx     Social History Social History  Substance Use Topics  . Smoking status: Never Smoker  . Smokeless tobacco: Never Used  . Alcohol use No     Allergies   Ace inhibitors   Review of Systems Review of Systems  Constitutional: Negative for fever.  HENT: Negative for congestion.   Eyes: Negative for visual disturbance.  Respiratory: Positive for shortness of breath.   Cardiovascular: Positive for chest pain. Negative for leg swelling.  Gastrointestinal: Negative for abdominal pain.  Genitourinary: Negative for dysuria.  Musculoskeletal: Negative for back pain.  Skin: Negative for rash.  Neurological: Negative for headaches.  Hematological: Does not bruise/bleed easily.  Psychiatric/Behavioral: Negative for confusion.     Physical Exam Updated Vital Signs BP 140/73   Pulse 63   Temp 98.5 F (36.9 C) (Oral)   Resp 18   Ht 1.829 m (6')   Wt 99.8 kg (220 lb)   SpO2 100%   BMI 29.84 kg/m   Physical Exam  Constitutional: He is oriented to person, place, and time. He appears well-developed and well-nourished. No distress.  HENT:  Head: Normocephalic and atraumatic.  Mouth/Throat: Oropharynx is clear and moist.  Eyes: Pupils are equal, round, and reactive to light. Conjunctivae and EOM are normal.  Neck: Normal range of motion. Neck supple.  Cardiovascular: Normal rate, regular rhythm and normal heart sounds.   Pulmonary/Chest: Effort normal and breath sounds normal. No respiratory distress. He has no wheezes. He exhibits no tenderness.  Abdominal: Soft. Bowel sounds are normal.  Musculoskeletal: Normal range of motion. He exhibits no edema.  Neurological: He is alert and oriented to person, place, and time. No cranial nerve deficit or sensory deficit. He exhibits normal muscle tone. Coordination normal.  Skin: Skin is warm. No rash noted.  Nursing note and vitals reviewed.    ED  Treatments / Results  Labs (all labs ordered are listed, but only abnormal results are displayed) Labs Reviewed  BASIC METABOLIC PANEL - Abnormal; Notable for the following:       Result Value   Glucose, Bld 217 (*)    Creatinine, Ser 1.30 (*)    Calcium 8.6 (*)    GFR calc non Af Amer 51 (*)    GFR calc Af Amer 59 (*)    All other components within normal limits  CBC  TROPONIN I  TROPONIN I    EKG  EKG Interpretation None     ED ECG REPORT   Date: 04/22/2017  Rate: 85  Rhythm: normal sinus rhythm  QRS Axis: normal  Intervals: normal  ST/T Wave abnormalities: normal  Conduction Disutrbances:none  Narrative Interpretation:   Old EKG Reviewed: none available  I have personally reviewed the EKG tracing and agree with the computerized printout as noted. Evidence of old inferior infarct age undetermined. No acute  process.  Radiology Dg Chest 2 View  Result Date: 04/22/2017 CLINICAL DATA:  Chest pain for 1 hour. EXAM: CHEST  2 VIEW COMPARISON:  08/07/2016. FINDINGS: Stable cardiomediastinal silhouette. Stable chronic blunting RIGHT CP angle, and chronic RIGHT pleural thickening. Stable RIGHT base scarring. No consolidation or edema. No pneumothorax. LEFT rotator cuff repair. IMPRESSION: Stable chronic changes as described. No active infiltrates or edema. Electronically Signed   By: Staci Righter M.D.   On: 04/22/2017 15:55    Procedures Procedures (including critical care time)  Medications Ordered in ED Medications - No data to display   Initial Impression / Assessment and Plan / ED Course  I have reviewed the triage vital signs and the nursing notes.  Pertinent labs & imaging results that were available during my care of the patient were reviewed by me and considered in my medical decision making (see chart for details).    Patient EKG without any acute abnormalities. Troponins 2 negative for any acute cardiac event. Chest x-ray negative. Patient asymptomatic.  Patient stable for discharge home and follow-up with his primary care doctor.   Final Clinical Impressions(s) / ED Diagnoses   Final diagnoses:  Precordial pain  SOB (shortness of breath)    New Prescriptions New Prescriptions   No medications on file     Fredia Sorrow, MD 04/22/17 2219

## 2017-05-02 ENCOUNTER — Ambulatory Visit: Payer: Medicare Other | Admitting: Emergency Medicine

## 2017-05-10 ENCOUNTER — Ambulatory Visit: Payer: Medicare Other | Admitting: Emergency Medicine

## 2017-05-24 ENCOUNTER — Ambulatory Visit: Payer: Medicare Other | Admitting: Pediatrics

## 2017-05-27 ENCOUNTER — Encounter: Payer: Self-pay | Admitting: Pediatrics

## 2017-05-28 ENCOUNTER — Ambulatory Visit: Payer: Medicare Other | Admitting: Pediatrics

## 2017-06-07 ENCOUNTER — Encounter: Payer: Self-pay | Admitting: Emergency Medicine

## 2017-06-07 ENCOUNTER — Ambulatory Visit: Payer: Medicare Other | Admitting: Emergency Medicine

## 2017-06-07 ENCOUNTER — Other Ambulatory Visit: Payer: Self-pay | Admitting: Pharmacist

## 2017-06-07 DIAGNOSIS — J9811 Atelectasis: Secondary | ICD-10-CM

## 2017-06-07 DIAGNOSIS — J45909 Unspecified asthma, uncomplicated: Secondary | ICD-10-CM

## 2017-06-07 NOTE — Assessment & Plan Note (Signed)
Stable by chest x-ray from 04/2017.  I do not believe he needs a repeat CT scan at this time.  We will plan to follow chest x-rays at least annually, sooner if he clinically changes.

## 2017-06-07 NOTE — Progress Notes (Signed)
   Subjective:    Patient ID: [name], male    DOB: 11/20/1935, 81 y.o.   MRN: [id number]  Asthma  He complains of cough. There is no shortness of breath or wheezing. Pertinent negatives include no appetite change, ear pain, fever, headaches, postnasal drip, rhinorrhea, sneezing, sore throat or trouble swallowing. His past medical history is significant for asthma.   PMH: Hypertension, diabetes, stroke, COPD  ROV 06/07/17 --patient is 65 with a history of obstructive lung disease/fixed asthma.  We have followed him for a right lower lobe density in the aftermath of a pneumonia and pleural effusion.  He has had PET scans and CT scans that have been reassuring.  He had a chest x-ray 04/22/17 that showed blunting of his right costophrenic angle and some right pleural thickening but without significant interval change when he was in ED for dyspnea and chest pain.  Currently managed on Breo. Uses albuterol. His daughter is here, believes that his breathing is stable, but that he has been coughing a lot. He denies congestion or heart burn.     Review of Systems  Constitutional: Negative for appetite change and fever.  HENT: Negative for congestion, dental problem, ear pain, nosebleeds, postnasal drip, rhinorrhea, sinus pressure, sneezing, sore throat and trouble swallowing.   Eyes: Negative for redness and itching.  Respiratory: Positive for cough. Negative for chest tightness, shortness of breath and wheezing.   Cardiovascular: Negative for palpitations and leg swelling.  Gastrointestinal: Negative for nausea and vomiting.  Genitourinary: Negative for dysuria.  Musculoskeletal: Negative for joint swelling.  Skin: Negative for rash.  Neurological: Negative for headaches.  Hematological: Does not bruise/bleed easily.  Psychiatric/Behavioral: Negative for dysphoric mood. The patient is not nervous/anxious.        Objective:   Physical Exam Vitals:   06/07/17 1127  BP: 140/88  Pulse: 76    SpO2: 98%  Weight: 211 lb (95.7 kg)  Height: 6' (1.829 m)   Gen: Pleasant, well-nourished, in no distress,  normal affect  ENT: No lesions,  mouth clear,  oropharynx clear, no postnasal drip  Neck: No JVD, no stridor  Lungs: No use of accessory muscles, clear without rales or rhonchi, decreased at the R base, no crackles.   Cardiovascular: RRR, heart sounds normal, no murmur or gallops, no peripheral edema  Musculoskeletal: No deformities, no cyanosis or clubbing  Neuro: alert, non focal  Skin: Warm, no lesions or rashes     Assessment & Plan:  Atelectasis of right lung Stable by chest x-ray from 04/2017.  I do not believe he needs a repeat CT scan at this time.  We will plan to follow chest x-rays at least annually, sooner if he clinically changes.  Intrinsic asthma Good clinical response to Breo.  He does have some significant cough, denies any GERD or rhinitis.  Continue the Brio for now.  Instructed him on use of albuterol as needed.  Flu shot is up-to-date.  Baltazar Apo, MD, PhD 06/07/2017, 11:58 AM [facility] [phone] or if no answer [phone]

## 2017-06-07 NOTE — Patient Instructions (Signed)
Please continue Brio once daily as you have been taking it.  Please remember to rinse and gargle after using this medication Keep albuterol available to use 2 puffs if needed for shortness of breath, wheezing, chest tightness Your chest x-ray done on 04/22/17 was stable.  This is good news, no significant change in your right lung scar that we have been following Follow with Dr Lamonte Sakai in 1 year with a chest x-ray.  Call to see [place] sooner if you have any changes in your breathing, cough, or any other respiratory symptoms.

## 2017-06-07 NOTE — Assessment & Plan Note (Signed)
Good clinical response to Breo.  He does have some significant cough, denies any GERD or rhinitis.  Continue the Brio for now.  Instructed him on use of albuterol as needed.  Flu shot is up-to-date.

## 2017-06-07 NOTE — Patient Outreach (Signed)
Incoming call from [name] in response to the [facility] Medication Adherence Campaign. Speak with patient and wife. HIPAA identifiers verified and verbal consent received.  Mrs. Wrede reports that the patient take atorvastatin 40 mg once daily as directed. Denies any missed doses or barriers to taking his medication. Reports that he has been sharing this medication with his spouse, as they both take the same medication and strength. Advise against this and Mrs. Panepinto reports that she will call a refill for his atorvastatin into the pharmacy today and start using his own bottle. Counsel on the importance of adherence to this medication.  Denies any further medication questions/concerns at this time.  Harlow Asa, PharmD, [facility] [phone]

## 2017-06-10 ENCOUNTER — Encounter: Payer: Self-pay | Admitting: Pediatrics

## 2017-06-10 ENCOUNTER — Ambulatory Visit (INDEPENDENT_AMBULATORY_CARE_PROVIDER_SITE_OTHER): Payer: Medicare Other | Admitting: Pediatrics

## 2017-06-10 VITALS — BP 132/82 | HR 72 | Temp 97.4°F | Ht 72.0 in | Wt 214.2 lb

## 2017-06-10 DIAGNOSIS — E785 Hyperlipidemia, unspecified: Secondary | ICD-10-CM

## 2017-06-10 DIAGNOSIS — E1129 Type 2 diabetes mellitus with other diabetic kidney complication: Secondary | ICD-10-CM

## 2017-06-10 DIAGNOSIS — I10 Essential (primary) hypertension: Secondary | ICD-10-CM

## 2017-06-10 DIAGNOSIS — R809 Proteinuria, unspecified: Secondary | ICD-10-CM | POA: Diagnosis not present

## 2017-06-10 LAB — BAYER DCA HB A1C WAIVED: HB A1C (BAYER DCA - WAIVED): 9.4 % — ABNORMAL HIGH (ref <7.0)

## 2017-06-10 MED ORDER — METFORMIN HCL 1000 MG PO TABS: 1000.0000 mg | ORAL_TABLET | Freq: Two times a day (BID) | ORAL | 0 refills | Status: DC

## 2017-06-10 NOTE — Progress Notes (Signed)
  Subjective:   Patient ID: [name], male    DOB: 1935-10-08, 81 y.o.   MRN: [id number] CC: Follow-up (3 month) Multiple med problems HPI: [name] is a 81 y.o. male presenting for Follow-up (3 month)  here today with his son, and his wife  Diabetes: Due for eye exam He eats hersheys chocolate bars regularly, he says sometimes none, sometimes 3-4 and a day, regular size  Says he can stop eating whatever he needs to stop eating if he is told to Has been eating a lot of sugary foods at home over the last few weeks due to the holidays he says Not checking blood sugars regularly at home On metformin twice a day  Hypertension: Taking meds regularly, not  Hyperlipidemia: Taking his medicine regularly, no side effects  Relevant past medical, surgical, family and social history reviewed. Allergies and medications reviewed and updated. Social History   Tobacco Use  Smoking Status Never Smoker  Smokeless Tobacco Never Used   ROS: Per HPI   Objective:    BP 132/82   Pulse 72   Temp (!) 97.4 F (36.3 C) (Oral)   Ht 6' (1.829 m)   Wt 214 lb 3.2 oz (97.2 kg)   BMI 29.05 kg/m   Wt Readings from Last 3 Encounters:  06/10/17 214 lb 3.2 oz (97.2 kg)  06/07/17 211 lb (95.7 kg)  04/22/17 220 lb (99.8 kg)    Gen: NAD, alert, cooperative with exam, NCAT EYES: EOMI, no conjunctival injection, or no icterus ENT:   OP without erythema LYMPH: no cervical LAD CV: NRRR, normal S1/S2, no murmur, distal pulses 2+ b/l Resp: CTABL, no wheezes, normal WOB Abd: +BS, soft, NTND. no guarding or organomegaly Ext: No edema, warm Neuro: Alert and oriented, strength equal b/l UE and LE, coordination grossly normal MSK: normal muscle bulk  Assessment & Plan:  [name] was seen today for follow-up multiple medical problems  Diagnoses and all orders for this visit:  Type 2 diabetes mellitus with microalbuminuria, without long-term current use of insulin (HCC) A1c 9.4 Pt hesitant to start  new medicines yet, previously well controlled with A1c of 6.4 six months ago.  He has started eating a lot of sweets in the last couple of months He wants to try changing his diet Check blood sugar every morning Bring numbers to next clinic visit in 5 weeks Has appointment with diabetic education set up for later this week -     Bayer DCA Hb A1c Waived -     metFORMIN (GLUCOPHAGE) 1000 MG tablet; Take 1 tablet (1,000 mg total) by mouth 2 (two) times daily with a meal.  Essential hypertension Stable, cont meds  Hyperlipidemia, unspecified hyperlipidemia type Stable, cont med  Follow up plan: Return in about 5 weeks (around 07/17/2017). Assunta Found, MD [place]

## 2017-06-10 NOTE — Patient Instructions (Signed)
Call to set up appointment for eye exam  Check blood sugars in the morning, write down numbers and bring to next clinic visit

## 2017-06-12 ENCOUNTER — Other Ambulatory Visit: Payer: Self-pay | Admitting: Family Medicine

## 2017-06-12 ENCOUNTER — Telehealth: Payer: Self-pay | Admitting: Pediatrics

## 2017-06-12 DIAGNOSIS — I10 Essential (primary) hypertension: Secondary | ICD-10-CM

## 2017-06-12 NOTE — Telephone Encounter (Signed)
What is the name of the medication? losartan  Have you contacted your pharmacy to request a refill? yes  Which pharmacy would you like this sent to? [facility]   Patient notified that their request is being sent to the clinical staff for review and that they should receive a call once it is complete. If they do not receive a call within 24 hours they can check with their pharmacy or our office.

## 2017-06-12 NOTE — Telephone Encounter (Signed)
Refill sent to pharmacy.   

## 2017-06-13 ENCOUNTER — Other Ambulatory Visit: Payer: Self-pay | Admitting: *Deleted

## 2017-06-13 ENCOUNTER — Telehealth: Payer: Self-pay | Admitting: *Deleted

## 2017-06-13 ENCOUNTER — Ambulatory Visit: Payer: Medicare Other | Admitting: *Deleted

## 2017-06-13 MED ORDER — PEN NEEDLES 32G X 4 MM MISC: 1.0000 | Freq: Two times a day (BID) | 3 refills | Status: DC

## 2017-06-13 MED ORDER — TRESIBA FLEXTOUCH 100 UNIT/ML ~~LOC~~ SOPN: 15.0000 [IU] | PEN_INJECTOR | Freq: Every day | SUBCUTANEOUS | 3 refills | Status: DC

## 2017-06-13 NOTE — Telephone Encounter (Signed)
Patient was seen today by [facility] Diabetic Educator  Mr. Bannan has not been taking Metformin-He can not tolerate per wife  Is to stop drinking regular sodas and eating candy bars.   Start Tresiba U100 15 units at bedtime.  Wife was instructed on how to give shot  Rx [place] U100 sent to pharmacy.  Max daily dose 50 units   Check BS daily fasting  Samples of Tresiba given  Rx for pen needles sent to pharmacy

## 2017-06-24 ENCOUNTER — Telehealth: Payer: Self-pay | Admitting: Pediatrics

## 2017-07-04 NOTE — Telephone Encounter (Signed)
No samples- attempted to contact patient and no answer.

## 2017-07-15 ENCOUNTER — Ambulatory Visit (INDEPENDENT_AMBULATORY_CARE_PROVIDER_SITE_OTHER): Payer: Medicare HMO | Admitting: Pediatrics

## 2017-07-15 ENCOUNTER — Encounter: Payer: Self-pay | Admitting: Pediatrics

## 2017-07-15 VITALS — BP 130/80 | HR 71 | Temp 97.0°F | Ht 72.0 in | Wt 210.2 lb

## 2017-07-15 DIAGNOSIS — I10 Essential (primary) hypertension: Secondary | ICD-10-CM

## 2017-07-15 DIAGNOSIS — Z794 Long term (current) use of insulin: Secondary | ICD-10-CM | POA: Diagnosis not present

## 2017-07-15 DIAGNOSIS — R809 Proteinuria, unspecified: Secondary | ICD-10-CM | POA: Diagnosis not present

## 2017-07-15 DIAGNOSIS — E1129 Type 2 diabetes mellitus with other diabetic kidney complication: Secondary | ICD-10-CM

## 2017-07-15 DIAGNOSIS — E785 Hyperlipidemia, unspecified: Secondary | ICD-10-CM

## 2017-07-15 NOTE — Patient Instructions (Signed)
Prescription assistance program through [facility] [phone]

## 2017-07-15 NOTE — Progress Notes (Signed)
  Subjective:   Patient ID: [name], male    DOB: June 06, 1936, 82 y.o.   MRN: [id number] CC: Follow-up (elevated BS)  HPI: [name] is a 82 y.o. male presenting for Follow-up (elevated BS)  Blood sugars in the 230s in the morning for the past week when he checks it Started on tresiba last visit, ran out of samples Drinking 1-2 cans of regular soda a day, eating candy regularly Here today with his wife Pt does have some dementia Daughter doing most of grocery shopping  No fevers, appetite has been well  Relevant past medical, surgical, family and social history reviewed. Allergies and medications reviewed and updated. Social History   Tobacco Use  Smoking Status Never Smoker  Smokeless Tobacco Never Used   ROS: Per HPI   Objective:    BP 130/80   Pulse 71   Temp (!) 97 F (36.1 C) (Oral)   Ht 6' (1.829 m)   Wt 210 lb 3.2 oz (95.3 kg)   BMI 28.51 kg/m   Wt Readings from Last 3 Encounters:  07/15/17 210 lb 3.2 oz (95.3 kg)  06/10/17 214 lb 3.2 oz (97.2 kg)  06/07/17 211 lb (95.7 kg)    Gen: NAD, alert, cooperative with exam, NCAT EYES: EOMI, no conjunctival injection, or no icterus ENT:  OP without erythema LYMPH: no cervical LAD CV: NRRR, normal S1/S2, no murmur, distal pulses 2+ b/l Resp: CTABL, no wheezes, normal WOB Abd: +BS, soft, NTND. no guarding or organomegaly Ext: No edema, warm Neuro: Alert and oriented  Assessment & Plan:  [name] was seen today for follow-up diabetes.  Diagnoses and all orders for this visit:  Type 2 diabetes mellitus with microalbuminuria, with long-term current use of insulin ([place]) Sample given of tresiba, pt referred to health dept pt assistance program to help with insulin copay If alternate preferred by insurance will switch Discussed at length needing to decrease regular soda and candy intake  Essential hypertension Stable, cont med  Hyperlipidemia, unspecified hyperlipidemia type Stable, cont statin  Follow up  plan: Return in about 4 weeks (around 08/12/2017). Kenneth Found, MD [place]

## 2017-09-20 ENCOUNTER — Ambulatory Visit: Payer: Medicare HMO | Admitting: Pediatrics

## 2017-10-02 ENCOUNTER — Encounter: Payer: Self-pay | Admitting: Pediatrics

## 2017-10-02 ENCOUNTER — Ambulatory Visit (INDEPENDENT_AMBULATORY_CARE_PROVIDER_SITE_OTHER): Payer: Medicare HMO | Admitting: Pediatrics

## 2017-10-02 VITALS — BP 130/71 | HR 77 | Temp 96.2°F | Ht 72.0 in | Wt 212.0 lb

## 2017-10-02 DIAGNOSIS — R809 Proteinuria, unspecified: Secondary | ICD-10-CM

## 2017-10-02 DIAGNOSIS — E1169 Type 2 diabetes mellitus with other specified complication: Secondary | ICD-10-CM | POA: Diagnosis not present

## 2017-10-02 DIAGNOSIS — I10 Essential (primary) hypertension: Secondary | ICD-10-CM | POA: Diagnosis not present

## 2017-10-02 DIAGNOSIS — E785 Hyperlipidemia, unspecified: Secondary | ICD-10-CM

## 2017-10-02 DIAGNOSIS — E1129 Type 2 diabetes mellitus with other diabetic kidney complication: Secondary | ICD-10-CM

## 2017-10-02 DIAGNOSIS — Z794 Long term (current) use of insulin: Secondary | ICD-10-CM | POA: Diagnosis not present

## 2017-10-02 LAB — BAYER DCA HB A1C WAIVED: HB A1C: 7.6 % — AB (ref <7.0)

## 2017-10-02 MED ORDER — TRESIBA FLEXTOUCH 100 UNIT/ML ~~LOC~~ SOPN: 16.0000 [IU] | PEN_INJECTOR | Freq: Every day | SUBCUTANEOUS | 3 refills | Status: DC

## 2017-10-02 MED ORDER — SEMAGLUTIDE(0.25 OR 0.5MG/DOS) 2 MG/1.5ML ~~LOC~~ SOPN: PEN_INJECTOR | SUBCUTANEOUS | 3 refills | Status: AC

## 2017-10-02 MED ORDER — ATORVASTATIN CALCIUM 40 MG PO TABS: 40.0000 mg | ORAL_TABLET | Freq: Every day | ORAL | 1 refills | Status: DC

## 2017-10-02 NOTE — Progress Notes (Signed)
  Subjective:   Patient ID: [name], male    DOB: [birth year], 82 y.o.   MRN: [id number] CC: Follow-up (3 month) Diabetes HPI: [name] is a 81 y.o. male presenting for Follow-up (3 month)  here today with his wife DM2: taking tresiba 16u once a day. Continues drinking sodas, having sweets at times.  Says he is open to decreasing.  Has some trouble with short term memory.  HLD: taking statin regularly  HTN: no CP, SOB  Relevant past medical, surgical, family and social history reviewed. Allergies and medications reviewed and updated. Social History   Tobacco Use  Smoking Status Never Smoker  Smokeless Tobacco Never Used   ROS: Per HPI   Objective:    BP 130/71   Pulse 77   Temp (!) 96.2 F (35.7 C) (Oral)   Ht 6' (1.829 m)   Wt 212 lb (96.2 kg)   BMI 28.75 kg/m   Wt Readings from Last 3 Encounters:  10/02/17 212 lb (96.2 kg)  07/15/17 210 lb 3.2 oz (95.3 kg)  06/10/17 214 lb 3.2 oz (97.2 kg)    Gen: NAD, alert, cooperative with exam, NCAT EYES: EOMI, no conjunctival injection, or no icterus CV: NRRR, normal S1/S2, no murmur, distal pulses 2+ b/l Resp: CTABL, no wheezes, normal WOB Abd: +BS, soft, NTND. no guarding or organomegaly Ext: No edema, warm Neuro: Alert and oriented, strength equal b/l UE and LE, coordination grossly normal MSK: normal muscle bulk  Assessment & Plan:  Demario was seen today for follow-up med problems  Diagnoses and all orders for this visit:  Type 2 diabetes mellitus with microalbuminuria, with long-term current use of insulin (HCC) a1c 7.6, cont meds Decrease sugar intake -     Bayer DCA Hb A1c Waived -     atorvastatin (LIPITOR) 40 MG tablet; Take 1 tablet (40 mg total) by mouth daily. -     TRESIBA FLEXTOUCH 100 UNIT/ML SOPN FlexTouch Pen; Inject 0.16 mLs (16 Units total) into the skin daily at 10 pm. -     Semaglutide (OZEMPIC) 0.25 or 0.5 MG/DOSE SOPN; Inject 0.25 mg into the skin once a week for 30 days, THEN 0.5 mg  once a week.  Hyperlipidemia associated with type 2 diabetes mellitus (HCC) Stable, cont statin  Essential hypertension Cont losartan  Follow up plan: Return in about 3 months (around 01/02/2018). Assunta Found, MD [place]

## 2017-10-04 ENCOUNTER — Encounter: Payer: Self-pay | Admitting: Pediatrics

## 2017-10-09 ENCOUNTER — Encounter: Payer: Self-pay | Admitting: Pediatrics

## 2017-10-09 ENCOUNTER — Ambulatory Visit (INDEPENDENT_AMBULATORY_CARE_PROVIDER_SITE_OTHER): Payer: Medicare HMO | Admitting: *Deleted

## 2017-10-09 ENCOUNTER — Encounter: Payer: Self-pay | Admitting: *Deleted

## 2017-10-09 ENCOUNTER — Telehealth: Payer: Self-pay | Admitting: Pediatrics

## 2017-10-09 ENCOUNTER — Ambulatory Visit (INDEPENDENT_AMBULATORY_CARE_PROVIDER_SITE_OTHER): Payer: Medicare HMO | Admitting: Pediatrics

## 2017-10-09 VITALS — BP 130/72 | HR 75 | Temp 97.0°F | Ht 72.0 in | Wt 207.0 lb

## 2017-10-09 VITALS — BP 147/82 | HR 80 | Ht 72.0 in | Wt 207.0 lb

## 2017-10-09 DIAGNOSIS — F0390 Unspecified dementia without behavioral disturbance: Secondary | ICD-10-CM | POA: Diagnosis not present

## 2017-10-09 DIAGNOSIS — L03114 Cellulitis of left upper limb: Secondary | ICD-10-CM

## 2017-10-09 DIAGNOSIS — Z0000 Encounter for general adult medical examination without abnormal findings: Secondary | ICD-10-CM

## 2017-10-09 MED ORDER — CEPHALEXIN 500 MG PO CAPS: 500.0000 mg | ORAL_CAPSULE | Freq: Two times a day (BID) | ORAL | 0 refills | Status: AC

## 2017-10-09 MED ORDER — MEMANTINE HCL 5 MG PO TABS: 5.0000 mg | ORAL_TABLET | Freq: Two times a day (BID) | ORAL | 0 refills | Status: DC

## 2017-10-09 NOTE — Telephone Encounter (Signed)
Please advise 

## 2017-10-09 NOTE — Progress Notes (Signed)
  Subjective:   Patient ID: [name], male    DOB: 09/09/1935, 82 y.o.   MRN: [id number] CC: Wound Check (right wrist, 1 week)  HPI: [name] is a 82 y.o. male presenting for Wound Check (right wrist, 1 week)  Prescription based on cement block porch 5 days ago.  Has been having more redness and tenderness around the site.  Has scabbed over.  No discharge.  Using alcohol and peroxide on it.  Last tetanus in 2017.  No fevers or chills.  Feeling well otherwise.  Continues to be bothered some with symptoms of dementia.  Wife asks about restarting Namenda, has been on it in the past.  He did not have any side effects while on it.  She thinks the dementia was stable while on it.  He describes the dementia as sometimes I do not know what day it is, knows the month.  Relevant past medical, surgical, family and social history reviewed. Allergies and medications reviewed and updated. Social History   Tobacco Use  Smoking Status Never Smoker  Smokeless Tobacco Never Used   ROS: Per HPI   Objective:    BP 130/72   Pulse 75   Temp (!) 97 F (36.1 C) (Oral)   Ht 6' (1.829 m)   Wt 207 lb (93.9 kg)   BMI 28.07 kg/m   Wt Readings from Last 3 Encounters:  10/09/17 207 lb (93.9 kg)  10/09/17 207 lb (93.9 kg)  10/02/17 212 lb (96.2 kg)    Gen: NAD, alert, cooperative with exam, NCAT EYES: EOMI, no conjunctival injection, or no icterus CV: NRRR, normal S1/S2, no murmur, distal pulses 2+ b/l Resp: CTABL, no wheezes, normal WOB Ext: No edema, warm Neuro: Alert and oriented to person, place, month.  Says it is 51 when asked the year. MSK: normal muscle bulk Skin: Right wrist approximately 2 cm x 1 cm superficial abrasion, white yellow scab in place.  Surrounded by approximately 0.5 cm of erythema.  Mildly tender around the edges.  No fluctuance, discharge.  Assessment & Plan:  [name] was seen today for wound check.  Diagnoses and all orders for this visit:  Cellulitis of left  upper extremity Clean area with soap and water.  Start below given redness around it.  If not improving let me know. -     cephALEXin (KEFLEX) 500 MG capsule; Take 1 capsule (500 mg total) by mouth 2 (two) times daily for 5 days.  Dementia without behavioral disturbance, unspecified dementia type Take once a day for a week.  If tolerating well okay to increase to twice a day. -     memantine (NAMENDA) 5 MG tablet; Take 1 tablet (5 mg total) by mouth 2 (two) times daily.   Follow up plan: Return if symptoms worsen or fail to improve. Assunta Found, MD [place]

## 2017-10-09 NOTE — Telephone Encounter (Signed)
Yes, needs to be seen. i'm here until 7 or can see it tomorrow

## 2017-10-09 NOTE — Patient Instructions (Signed)
Work on increasing your vegetable intake to 2-3 servings per day.    Work on reducing chocolate to 1 hershey bar per week.   You are scheduled for an eye exam at [facility] in [place] on 11/04/17 at 10:40 am.  Please review the information given on Advanced Directives, and if you complete these please bring our office a copy to file in your medical record.  Thank you for coming in for your Annual Wellness Visit today!   Preventive Care 56 Years and Older, Male Preventive care refers to lifestyle choices and visits with your health care provider that can promote health and wellness. What does preventive care include?  A yearly physical exam. This is also called an annual well check.  Dental exams once or twice a year.  Routine eye exams. Ask your health care provider how often you should have your eyes checked.  Personal lifestyle choices, including: ? Daily care of your teeth and gums. ? Regular physical activity. ? Eating a healthy diet. ? Avoiding tobacco and drug use. ? Limiting alcohol use. ? Practicing safe sex. ? Taking low doses of aspirin every day. ? Taking vitamin and mineral supplements as recommended by your health care provider. What happens during an annual well check? The services and screenings done by your health care provider during your annual well check will depend on your age, overall health, lifestyle risk factors, and family history of disease. Counseling Your health care provider may ask you questions about your:  Alcohol use.  Tobacco use.  Drug use.  Emotional well-being.  Home and relationship well-being.  Sexual activity.  Eating habits.  History of falls.  Memory and ability to understand (cognition).  Work and work Statistician.  Screening You may have the following tests or measurements:  Height, weight, and BMI.  Blood pressure.  Lipid and cholesterol levels. These may be checked every 5 years, or more frequently if you  are over 80 years old.  Skin check.  Lung cancer screening. You may have this screening every year starting at age 80 if you have a 30-pack-year history of smoking and currently smoke or have quit within the past 15 years.  Fecal occult blood test (FOBT) of the stool. You may have this test every year starting at age 47.  Flexible sigmoidoscopy or colonoscopy. You may have a sigmoidoscopy every 5 years or a colonoscopy every 10 years starting at age 80.  Prostate cancer screening. Recommendations will vary depending on your family history and other risks.  Hepatitis C blood test.  Hepatitis B blood test.  Sexually transmitted disease (STD) testing.  Diabetes screening. This is done by checking your blood sugar (glucose) after you have not eaten for a while (fasting). You may have this done every 1-3 years.  Abdominal aortic aneurysm (AAA) screening. You may need this if you are a current or former smoker.  Osteoporosis. You may be screened starting at age 80 if you are at high risk.  Talk with your health care provider about your test results, treatment options, and if necessary, the need for more tests. Vaccines Your health care provider may recommend certain vaccines, such as:  Influenza vaccine. This is recommended every year.  Tetanus, diphtheria, and acellular pertussis (Tdap, Td) vaccine. You may need a Td booster every 10 years.  Varicella vaccine. You may need this if you have not been vaccinated.  Zoster vaccine. You may need this after age 80.  Measles, mumps, and rubella (MMR) vaccine.  You may need at least one dose of MMR if you were born in 1957 or later. You may also need a second dose.  Pneumococcal 13-valent conjugate (PCV13) vaccine. One dose is recommended after age 80.  Pneumococcal polysaccharide (PPSV23) vaccine. One dose is recommended after age 80.  Meningococcal vaccine. You may need this if you have certain conditions.  Hepatitis A vaccine. You may  need this if you have certain conditions or if you travel or work in places where you may be exposed to hepatitis A.  Hepatitis B vaccine. You may need this if you have certain conditions or if you travel or work in places where you may be exposed to hepatitis B.  Haemophilus influenzae type b (Hib) vaccine. You may need this if you have certain risk factors.  Talk to your health care provider about which screenings and vaccines you need and how often you need them. This information is not intended to replace advice given to you by your health care provider. Make sure you discuss any questions you have with your health care provider. Document Released: 07/22/2015 Document Revised: 03/14/2016 Document Reviewed: 04/26/2015 Elsevier Interactive Patient Education  2018 [place] in the Home Falls can cause injuries. They can happen to people of all ages. There are many things you can do to make your home safe and to help prevent falls. What can I do on the outside of my home?  Regularly fix the edges of walkways and driveways and fix any cracks.  Remove anything that might make you trip as you walk through a door, such as a raised step or threshold.  Trim any bushes or trees on the path to your home.  Use bright outdoor lighting.  Clear any walking paths of anything that might make someone trip, such as rocks or tools.  Regularly check to see if handrails are loose or broken. Make sure that both sides of any steps have handrails.  Any raised decks and porches should have guardrails on the edges.  Have any leaves, snow, or ice cleared regularly.  Use sand or salt on walking paths during winter.  Clean up any spills in your garage right away. This includes oil or grease spills. What can I do in the bathroom?  Use night lights.  Install grab bars by the toilet and in the tub and shower. Do not use towel bars as grab bars.  Use non-skid mats or decals in the  tub or shower.  If you need to sit down in the shower, use a plastic, non-slip stool.  Keep the floor dry. Clean up any water that spills on the floor as soon as it happens.  Remove soap buildup in the tub or shower regularly.  Attach bath mats securely with double-sided non-slip rug tape.  Do not have throw rugs and other things on the floor that can make you trip. What can I do in the bedroom?  Use night lights.  Make sure that you have a light by your bed that is easy to reach.  Do not use any sheets or blankets that are too big for your bed. They should not hang down onto the floor.  Have a firm chair that has side arms. You can use this for support while you get dressed.  Do not have throw rugs and other things on the floor that can make you trip. What can I do in the kitchen?  Clean up any spills right  away.  Avoid walking on wet floors.  Keep items that you use a lot in easy-to-reach places.  If you need to reach something above you, use a strong step stool that has a grab bar.  Keep electrical cords out of the way.  Do not use floor polish or wax that makes floors slippery. If you must use wax, use non-skid floor wax.  Do not have throw rugs and other things on the floor that can make you trip. What can I do with my stairs?  Do not leave any items on the stairs.  Make sure that there are handrails on both sides of the stairs and use them. Fix handrails that are broken or loose. Make sure that handrails are as long as the stairways.  Check any carpeting to make sure that it is firmly attached to the stairs. Fix any carpet that is loose or worn.  Avoid having throw rugs at the top or bottom of the stairs. If you do have throw rugs, attach them to the floor with carpet tape.  Make sure that you have a light switch at the top of the stairs and the bottom of the stairs. If you do not have them, ask someone to add them for you. What else can I do to help prevent  falls?  Wear shoes that: ? Do not have high heels. ? Have rubber bottoms. ? Are comfortable and fit you well. ? Are closed at the toe. Do not wear sandals.  If you use a stepladder: ? Make sure that it is fully opened. Do not climb a closed stepladder. ? Make sure that both sides of the stepladder are locked into place. ? Ask someone to hold it for you, if possible.  Clearly mark and make sure that you can see: ? Any grab bars or handrails. ? First and last steps. ? Where the edge of each step is.  Use tools that help you move around (mobility aids) if they are needed. These include: ? Canes. ? Walkers. ? Scooters. ? Crutches.  Turn on the lights when you go into a dark area. Replace any light bulbs as soon as they burn out.  Set up your furniture so you have a clear path. Avoid moving your furniture around.  If any of your floors are uneven, fix them.  If there are any pets around you, be aware of where they are.  Review your medicines with your doctor. Some medicines can make you feel dizzy. This can increase your chance of falling. Ask your doctor what other things that you can do to help prevent falls. This information is not intended to replace advice given to you by your health care provider. Make sure you discuss any questions you have with your health care provider. Document Released: 04/21/2009 Document Revised: 12/01/2015 Document Reviewed: 07/30/2014 Elsevier Interactive Patient Education  [employer].

## 2017-10-09 NOTE — Patient Instructions (Signed)
Namenda: Take once daily in the morning for a week. Can increase to twice a day after a week.  Skin infection: I sent keflex into [place].

## 2017-10-10 ENCOUNTER — Telehealth: Payer: Self-pay | Admitting: *Deleted
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
## 2017-10-10 NOTE — Telephone Encounter (Signed)
Aware that patient will need to follow up in office in 1 week to recheck arm
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
## 2018-01-03 ENCOUNTER — Encounter: Payer: Self-pay | Admitting: Pediatrics

## 2018-01-03 ENCOUNTER — Ambulatory Visit (INDEPENDENT_AMBULATORY_CARE_PROVIDER_SITE_OTHER): Payer: Medicare HMO | Admitting: Pediatrics

## 2018-01-03 VITALS — BP 133/87 | HR 72 | Temp 97.4°F | Ht 72.0 in | Wt 206.2 lb

## 2018-01-03 DIAGNOSIS — F0390 Unspecified dementia without behavioral disturbance: Secondary | ICD-10-CM

## 2018-01-03 DIAGNOSIS — I10 Essential (primary) hypertension: Secondary | ICD-10-CM | POA: Diagnosis not present

## 2018-01-03 DIAGNOSIS — Z794 Long term (current) use of insulin: Secondary | ICD-10-CM

## 2018-01-03 DIAGNOSIS — E1129 Type 2 diabetes mellitus with other diabetic kidney complication: Secondary | ICD-10-CM | POA: Diagnosis not present

## 2018-01-03 DIAGNOSIS — R809 Proteinuria, unspecified: Secondary | ICD-10-CM

## 2018-01-03 DIAGNOSIS — R911 Solitary pulmonary nodule: Secondary | ICD-10-CM | POA: Diagnosis not present

## 2018-01-03 DIAGNOSIS — J9811 Atelectasis: Secondary | ICD-10-CM | POA: Diagnosis not present

## 2018-01-03 LAB — BAYER DCA HB A1C WAIVED: HB A1C: 7 % — AB (ref <7.0)

## 2018-01-03 MED ORDER — LOSARTAN POTASSIUM 50 MG PO TABS: 50.0000 mg | ORAL_TABLET | Freq: Every day | ORAL | 2 refills | Status: DC

## 2018-01-03 MED ORDER — MEMANTINE HCL 5 MG PO TABS: 5.0000 mg | ORAL_TABLET | Freq: Two times a day (BID) | ORAL | 0 refills | Status: DC

## 2018-01-03 MED ORDER — TRESIBA FLEXTOUCH 100 UNIT/ML ~~LOC~~ SOPN: 16.0000 [IU] | PEN_INJECTOR | Freq: Every day | SUBCUTANEOUS | 3 refills | Status: DC

## 2018-01-03 NOTE — Progress Notes (Signed)
  Subjective:   Patient ID: [name], male    DOB: October 27, 1935, 81 y.o.   MRN: [id number] CC: Medical Management of Chronic Issues  HPI: [name] is a 82 y.o. male   Here today with his wife  Diabetes: Eating about 2 chocolate bars a week.  Taking Tresiba 16 units a day.  No lows that they remember.  Did not bring blood sugars with him today.    Hypertension: Taking his medicine regularly.  No lightheadedness, chest pain, shortness of breath.    Hyperlipidemia: Tolerating statin   Abnormal CT scan: Rounded density seen on CT scan in September 2017.  Per pulmonology note, plan to repeat CT scan in 1 year.  Overdue.  Dementia: History of CVA.  No worsening symptoms per patient and wife.  Taking Namenda twice daily.  Has been feeling well otherwise.  No coughing.  His breathing is been fine.   Relevant past medical, surgical, family and social history reviewed. Allergies and medications reviewed and updated. Social History   Tobacco Use  Smoking Status Never Smoker  Smokeless Tobacco Never Used   ROS: Per HPI   Objective:    BP 133/87   Pulse 72   Temp (!) 97.4 F (36.3 C) (Oral)   Ht 6' (1.829 m)   Wt 206 lb 3.2 oz (93.5 kg)   BMI 27.97 kg/m   Wt Readings from Last 3 Encounters:  01/03/18 206 lb 3.2 oz (93.5 kg)  10/09/17 207 lb (93.9 kg)  10/09/17 207 lb (93.9 kg)    Gen: NAD, alert, cooperative with exam, NCAT EYES: EOMI, no conjunctival injection, or no icterus ENT:  TMs pearly gray b/l, OP without erythema LYMPH: no cervical LAD CV: NRRR, normal S1/S2, no murmur, distal pulses 2+ b/l Resp: CTABL, no wheezes, normal WOB Abd: +BS, soft, NTND. Ext: No edema, warm Neuro: Alert and oriented MSK: normal muscle bulk Skin: Well-healed wound right extensor surface wristt.  Assessment & Plan:  Dink was seen today for medical management of chronic issues.  Diagnoses and all orders for this visit:  Type 2 diabetes mellitus with microalbuminuria, with  long-term current use of insulin (HCC) A1c 7.0.  Taking Tresiba 16 units at night.  Continue to encourage avoiding sugary foods and sodas. -     Bayer DCA Hb A1c Waived -     TRESIBA FLEXTOUCH 100 UNIT/ML SOPN FlexTouch Pen; Inject 0.16 mLs (16 Units total) into the skin daily at 10 pm.  Essential hypertension Stable, continue below -     losartan (COZAAR) 50 MG tablet; Take 1 tablet (50 mg total) by mouth daily. -     Basic Metabolic Panel  Dementia without behavioral disturbance, unspecified dementia type Stable, continue below -     memantine (NAMENDA) 5 MG tablet; Take 1 tablet (5 mg total) by mouth 2 (two) times daily.  Atelectasis of right lung Plan per pulmonology note to repeat chest x-ray in 1 year.  Overdue. -     CT Chest Wo Contrast; Future  Solitary pulmonary nodule -     CT Chest Wo Contrast; Future   Follow up plan: Return in about 3 months (around 04/05/2018). Assunta Found, MD [place]

## 2018-01-23 ENCOUNTER — Ambulatory Visit (HOSPITAL_COMMUNITY)
Admission: RE | Admit: 2018-01-23 | Discharge: 2018-01-23 | Disposition: A | Discharge status: Home / Self Care | Payer: Medicare HMO | Source: Ambulatory Visit | Attending: Pediatrics | Admitting: Pediatrics

## 2018-01-23 DIAGNOSIS — J9811 Atelectasis: Secondary | ICD-10-CM | POA: Insufficient documentation

## 2018-01-23 DIAGNOSIS — I700 Atherosclerosis of aorta: Secondary | ICD-10-CM | POA: Insufficient documentation

## 2018-01-23 DIAGNOSIS — I2510 Atherosclerotic heart disease of native coronary artery without angina pectoris: Secondary | ICD-10-CM | POA: Diagnosis not present

## 2018-01-23 DIAGNOSIS — R911 Solitary pulmonary nodule: Secondary | ICD-10-CM | POA: Diagnosis not present

## 2018-01-23 DIAGNOSIS — J90 Pleural effusion, not elsewhere classified: Secondary | ICD-10-CM | POA: Diagnosis not present

## 2018-01-23 DIAGNOSIS — D3502 Benign neoplasm of left adrenal gland: Secondary | ICD-10-CM | POA: Insufficient documentation

## 2018-01-23 DIAGNOSIS — K449 Diaphragmatic hernia without obstruction or gangrene: Secondary | ICD-10-CM | POA: Diagnosis not present

## 2018-01-31 ENCOUNTER — Telehealth: Payer: Self-pay | Admitting: Pediatrics

## 2018-01-31 NOTE — Telephone Encounter (Signed)
Patient is requesting CT results.

## 2018-01-31 NOTE — Telephone Encounter (Signed)
D/w wife

## 2018-03-13 ENCOUNTER — Ambulatory Visit (INDEPENDENT_AMBULATORY_CARE_PROVIDER_SITE_OTHER): Payer: Medicare HMO | Admitting: Family Medicine

## 2018-03-13 ENCOUNTER — Encounter: Payer: Self-pay | Admitting: Family Medicine

## 2018-03-13 VITALS — BP 131/72 | HR 76 | Temp 97.1°F | Ht 72.0 in | Wt 206.2 lb

## 2018-03-13 DIAGNOSIS — W57XXXA Bitten or stung by nonvenomous insect and other nonvenomous arthropods, initial encounter: Secondary | ICD-10-CM

## 2018-03-13 DIAGNOSIS — R21 Rash and other nonspecific skin eruption: Secondary | ICD-10-CM

## 2018-03-13 MED ORDER — TRIAMCINOLONE ACETONIDE 0.1 % EX CREA: 1.0000 "application " | TOPICAL_CREAM | Freq: Two times a day (BID) | CUTANEOUS | 0 refills | Status: DC

## 2018-03-13 NOTE — Progress Notes (Signed)
Subjective:  [name] is a 82 year old male with a chief complaint of bilateral lower extremity rash for 1 week. The rash is pruritic in nature. He has no fever, watery eyes, or allergy-like symptoms. There are no pets in the house, but the patient does admit to feeding a stray cat most mornings. The patient spends a couple hours a day sitting in his yard. His wife changed their detergent one week ago as well. Besides using alcohol on the rash, he has not taken anything to help with the itching.  It is improving some but he is coming in to try and find out exactly what is happening.  Review of Systems  Constitutional: Negative for chills and fever.  HENT: Negative for congestion.   Respiratory: Negative for cough and shortness of breath.   Gastrointestinal: Negative for nausea and vomiting.  Skin: Positive for itching and rash.    Objective:  BP: 131/72 mmHg Pulse: 76 bmp T: 97.1 F Wt: 206.2 lb  PE: General--alert and oriented, in no acute distress HEENT--No changes to hearing or vision CV--Normal rate and rhythm, no murmur, distal pulses 2+ bilaterally Pulm--clear to auscultation bilaterally, no wheezes or rales Skin--0.5 cm raised erythematous papules scabbed over with excoriations, no signs of erythema or drainage  Assessment & Plan:  Arthropod-bites -Patient given triamcinolone cream to use for itching. Patient told to use cream and if the rash does not improve or resolve within a week to follow up with the office.  Problem List Items Addressed This Visit    None    Visit Diagnoses    Arthropod bite, initial encounter    -  Primary   Relevant Medications   triamcinolone cream (KENALOG) 0.1 %     Patient seen and examined with Rejeana Brock, [employer] student. Agree with assessment and plan above.   Caryl Pina, MD [facility] 03/13/2018, 12:51 PM

## 2018-04-09 ENCOUNTER — Encounter: Payer: Self-pay | Admitting: Pediatrics

## 2018-04-09 ENCOUNTER — Other Ambulatory Visit: Payer: Self-pay | Admitting: Pediatrics

## 2018-04-09 ENCOUNTER — Ambulatory Visit (INDEPENDENT_AMBULATORY_CARE_PROVIDER_SITE_OTHER): Payer: Medicare HMO | Admitting: Pediatrics

## 2018-04-09 VITALS — BP 137/81 | HR 76 | Temp 97.7°F | Ht 72.0 in | Wt 204.0 lb

## 2018-04-09 DIAGNOSIS — I10 Essential (primary) hypertension: Secondary | ICD-10-CM

## 2018-04-09 DIAGNOSIS — F0390 Unspecified dementia without behavioral disturbance: Secondary | ICD-10-CM | POA: Diagnosis not present

## 2018-04-09 DIAGNOSIS — R809 Proteinuria, unspecified: Secondary | ICD-10-CM | POA: Diagnosis not present

## 2018-04-09 DIAGNOSIS — E1129 Type 2 diabetes mellitus with other diabetic kidney complication: Secondary | ICD-10-CM | POA: Diagnosis not present

## 2018-04-09 DIAGNOSIS — Z794 Long term (current) use of insulin: Secondary | ICD-10-CM

## 2018-04-09 DIAGNOSIS — E785 Hyperlipidemia, unspecified: Secondary | ICD-10-CM | POA: Diagnosis not present

## 2018-04-09 DIAGNOSIS — Z23 Encounter for immunization: Secondary | ICD-10-CM

## 2018-04-09 DIAGNOSIS — E1169 Type 2 diabetes mellitus with other specified complication: Secondary | ICD-10-CM | POA: Diagnosis not present

## 2018-04-09 LAB — CBC WITH DIFFERENTIAL/PLATELET
BASOS ABS: 0.1 10*3/uL (ref 0.0–0.2)
BASOS: 1 %
EOS (ABSOLUTE): 0.2 10*3/uL (ref 0.0–0.4)
Eos: 3 %
Hematocrit: 37.1 % — ABNORMAL LOW (ref 37.5–51.0)
Hemoglobin: 12.8 g/dL — ABNORMAL LOW (ref 13.0–17.7)
IMMATURE GRANS (ABS): 0 10*3/uL (ref 0.0–0.1)
Immature Granulocytes: 0 %
LYMPHS ABS: 1.3 10*3/uL (ref 0.7–3.1)
LYMPHS: 25 %
MCH: 30.8 pg (ref 26.6–33.0)
MCHC: 34.5 g/dL (ref 31.5–35.7)
MCV: 89 fL (ref 79–97)
Monocytes Absolute: 0.5 10*3/uL (ref 0.1–0.9)
Monocytes: 10 %
NEUTROS ABS: 3.1 10*3/uL (ref 1.4–7.0)
Neutrophils: 61 %
PLATELETS: 177 10*3/uL (ref 150–450)
RBC: 4.16 x10E6/uL (ref 4.14–5.80)
RDW: 13.2 % (ref 12.3–15.4)
WBC: 5 10*3/uL (ref 3.4–10.8)

## 2018-04-09 LAB — BMP8+EGFR
BUN/Creatinine Ratio: 9 — ABNORMAL LOW (ref 10–24)
BUN: 13 mg/dL (ref 8–27)
CALCIUM: 8.5 mg/dL — AB (ref 8.6–10.2)
CO2: 22 mmol/L (ref 20–29)
CREATININE: 1.39 mg/dL — AB (ref 0.76–1.27)
Chloride: 103 mmol/L (ref 96–106)
GFR calc Af Amer: 55 mL/min/{1.73_m2} — ABNORMAL LOW (ref >59)
GFR calc non Af Amer: 48 mL/min/{1.73_m2} — ABNORMAL LOW (ref >59)
Glucose: 182 mg/dL — ABNORMAL HIGH (ref 65–99)
Potassium: 3.9 mmol/L (ref 3.5–5.2)
Sodium: 142 mmol/L (ref 134–144)

## 2018-04-09 LAB — LIPID PANEL
CHOL/HDL RATIO: 2.9 ratio (ref 0.0–5.0)
CHOLESTEROL TOTAL: 93 mg/dL — AB (ref 100–199)
HDL: 32 mg/dL — AB (ref >39)
LDL CALC: 36 mg/dL (ref 0–99)
TRIGLYCERIDES: 124 mg/dL (ref 0–149)
VLDL CHOLESTEROL CAL: 25 mg/dL (ref 5–40)

## 2018-04-09 LAB — BAYER DCA HB A1C WAIVED: HB A1C: 7.5 % — AB (ref <7.0)

## 2018-04-09 MED ORDER — ONETOUCH DELICA LANCETS FINE MISC: 1.0000 | Freq: Two times a day (BID) | 12 refills | Status: DC

## 2018-04-09 MED ORDER — ATORVASTATIN CALCIUM 40 MG PO TABS: 40.0000 mg | ORAL_TABLET | Freq: Every day | ORAL | 1 refills | Status: DC

## 2018-04-09 MED ORDER — MEMANTINE HCL 5 MG PO TABS: 5.0000 mg | ORAL_TABLET | Freq: Two times a day (BID) | ORAL | 0 refills | Status: DC

## 2018-04-09 MED ORDER — GLUCOSE BLOOD VI STRP: ORAL_STRIP | 12 refills | Status: DC

## 2018-04-09 MED ORDER — TRESIBA FLEXTOUCH 100 UNIT/ML ~~LOC~~ SOPN
16.0000 [IU] | PEN_INJECTOR | Freq: Every day | SUBCUTANEOUS | 3 refills | Status: DC
Start: 2018-04-09 — End: 2018-11-27

## 2018-04-09 MED ORDER — PEN NEEDLES 32G X 4 MM MISC: 1.0000 | Freq: Two times a day (BID) | 3 refills | Status: DC

## 2018-04-09 NOTE — Progress Notes (Signed)
  Subjective:   Patient ID: [name], male    DOB: 1936-03-13, 82 y.o.   MRN: [id number] CC: Medical Management of Chronic Issues  HPI: [name] is a 82 y.o. male   Memory problems: Now on Namenda.  Wife thinks it has been helping him some.  Patient notes no bad side effects with it.  Diabetes: Taking Tresiba 16 units at night.  Not missing any doses.  Tolerating medicine.  No lows.  Cutting back on sugary foods.  Hyperlipidemia: Taking atorvastatin regularly.  No side effects.  Hypertension: Taking medicine regularly.  No lightheadedness or dizziness.  Would like to walk several miles daily, family gets nervous when he is gone for long periods of time. He has been feeling well. Appetite has been fine.   Relevant past medical, surgical, family and social history reviewed. Allergies and medications reviewed and updated. Social History   Tobacco Use  Smoking Status Never Smoker  Smokeless Tobacco Never Used   ROS: Per HPI   Objective:    BP 137/81   Pulse 76   Temp 97.7 F (36.5 C) (Oral)   Ht 6' (1.829 m)   Wt 204 lb (92.5 kg)   BMI 27.67 kg/m   Wt Readings from Last 3 Encounters:  04/09/18 204 lb (92.5 kg)  03/13/18 206 lb 3.2 oz (93.5 kg)  01/03/18 206 lb 3.2 oz (93.5 kg)    Gen: NAD, alert, cooperative with exam, NCAT EYES: EOMI, no conjunctival injection, or no icterus ENT:  OP without erythema CV: NRRR, normal S1/S2, no murmur, distal pulses 2+ b/l Resp: CTABL, no wheezes, normal WOB Abd: +BS, soft, NTND. no guarding or organomegaly Ext: No edema, warm Neuro: Alert and oriented, strength equal b/l UE and LE, coordination grossly normal MSK: normal muscle bulk  Assessment & Plan:  Caeson was seen today for medical management of chronic issues.  Diagnoses and all orders for this visit:  Type 2 diabetes mellitus with microalbuminuria, with long-term current use of insulin (HCC) A1c 7.5.  Continue current medicines.  Continue to avoid sugary foods  when able. -     BMP8+EGFR -     Lipid panel -     CBC with Differential/Platelet -     Microalbumin / creatinine urine ratio -     Bayer DCA Hb A1c Waived  Hyperlipidemia associated with type 2 diabetes mellitus (HCC) Stable, continue atorvastatin. -     BMP8+EGFR -     Lipid panel -     CBC with Differential/Platelet -     Microalbumin / creatinine urine ratio -     Bayer DCA Hb A1c Waived  Dementia without behavioral disturbance, unspecified dementia type (HCC) Stable, improved symptoms on Namenda, continue for now.  Essential hypertension Stable, continue current medicines  Follow up plan: Return in about 3 months (around 07/10/2018). Assunta Found, MD [place]

## 2018-04-10 MED ORDER — GLUCOSE BLOOD VI STRP: ORAL_STRIP | 3 refills | Status: DC

## 2018-04-10 MED ORDER — ACCU-CHEK AVIVA PLUS W/DEVICE KIT: PACK | 0 refills | Status: DC

## 2018-04-10 MED ORDER — ACCU-CHEK AVIVA VI SOLN: 0 refills | Status: DC

## 2018-04-10 MED ORDER — ACCU-CHEK SOFTCLIX LANCETS MISC: 3 refills | Status: DC

## 2018-05-15 ENCOUNTER — Encounter: Payer: Self-pay | Admitting: *Deleted

## 2018-06-10 ENCOUNTER — Ambulatory Visit: Payer: Medicare HMO | Admitting: Emergency Medicine

## 2018-06-26 ENCOUNTER — Ambulatory Visit (INDEPENDENT_AMBULATORY_CARE_PROVIDER_SITE_OTHER): Payer: Medicare HMO | Admitting: Pediatrics

## 2018-06-26 ENCOUNTER — Encounter: Payer: Self-pay | Admitting: Pediatrics

## 2018-06-26 VITALS — BP 127/75 | HR 91 | Temp 97.6°F | Resp 24 | Ht 72.0 in | Wt 202.0 lb

## 2018-06-26 DIAGNOSIS — J4521 Mild intermittent asthma with (acute) exacerbation: Secondary | ICD-10-CM | POA: Diagnosis not present

## 2018-06-26 DIAGNOSIS — J069 Acute upper respiratory infection, unspecified: Secondary | ICD-10-CM

## 2018-06-26 MED ORDER — DOXYCYCLINE HYCLATE 100 MG PO TABS: 100.0000 mg | ORAL_TABLET | Freq: Two times a day (BID) | ORAL | 0 refills | Status: DC

## 2018-06-26 NOTE — Progress Notes (Signed)
  Subjective:   Patient ID: [name], male    DOB: 25-May-1936, 82 y.o.   MRN: [id number] CC: No chief complaint on file.  HPI: [name] is a 82 y.o. male   Symptoms started 3 to 4 days ago.  Cough bothering him the most.  Mostly dry.  History of asthma.  Has been on inhalers in the past, has not needed any for last year, not currently on controller medicine.  No recent flares.  Does feel more short of breath than usual.  Nasal congestion also bothering him.  He and his wife are unsure about fevers at home, denies any chills, sweats.  Has been more tired than usual.  Appetite has been okay.  Relevant past medical, surgical, family and social history reviewed. Allergies and medications reviewed and updated. Social History   Tobacco Use  Smoking Status Never Smoker  Smokeless Tobacco Never Used   ROS: Per HPI   Objective:    BP 127/75   Pulse 91   Temp 97.6 F (36.4 C) (Oral)   Resp (!) 24   Ht 6' (1.829 m)   Wt 202 lb (91.6 kg)   SpO2 98%   BMI 27.40 kg/m   Wt Readings from Last 3 Encounters:  06/26/18 202 lb (91.6 kg)  04/09/18 204 lb (92.5 kg)  03/13/18 206 lb 3.2 oz (93.5 kg)    Gen: NAD, alert, cooperative with exam, NCAT EYES: EOMI, no conjunctival injection, or no icterus ENT:  TMs pink b/l with layering yellow effusion, OP without erythema LYMPH: no cervical LAD CV: NRRR, normal S1/S2, no murmur, distal pulses 2+ b/l Resp: End expiratory wheeze with forced exhalation, speaking in complete sentences, no crackles Ext: No edema, warm Neuro: Alert and oriented to person, place  Assessment & Plan:  Diagnoses and all orders for this visit:  Mild intermittent asthma with acute exacerbation Albuterol 2-3 times a day for the next few days.  Start below.  Any worsening in wheezing let me know.  May need to start prednisone.  Subtle wheezing today.  Prednisone will elevate his blood sugars. -     doxycycline (VIBRA-TABS) 100 MG tablet; Take 1 tablet (100 mg total)  by mouth 2 (two) times daily.  Acute URI Symptom care discussed  Follow up plan: Return if symptoms worsen or fail to improve.  Has follow-up appoint with me in a couple weeks. Assunta Found, MD [place]

## 2018-06-26 NOTE — Patient Instructions (Signed)
Albuterol 2-3 times a day for the next 5 days

## 2018-06-27 ENCOUNTER — Telehealth: Payer: Self-pay | Admitting: Pediatrics

## 2018-06-27 DIAGNOSIS — R062 Wheezing: Secondary | ICD-10-CM

## 2018-06-27 NOTE — Telephone Encounter (Signed)
[facility] has called stating that they are needing the nebulize machine for the albuterol, states she thought they thought they had the machine but they didn't

## 2018-06-27 NOTE — Telephone Encounter (Signed)
Aware of script.  Please fax to [facility].

## 2018-06-27 NOTE — Telephone Encounter (Signed)
Please advise on ordering a new nebulizer machine.

## 2018-06-27 NOTE — Telephone Encounter (Signed)
printed

## 2018-07-08 ENCOUNTER — Ambulatory Visit: Payer: Medicare HMO | Admitting: Emergency Medicine

## 2018-07-08 ENCOUNTER — Encounter: Payer: Self-pay | Admitting: Emergency Medicine

## 2018-07-08 DIAGNOSIS — J449 Chronic obstructive pulmonary disease, unspecified: Secondary | ICD-10-CM | POA: Diagnosis not present

## 2018-07-08 DIAGNOSIS — J45909 Unspecified asthma, uncomplicated: Secondary | ICD-10-CM | POA: Diagnosis not present

## 2018-07-08 DIAGNOSIS — J9811 Atelectasis: Secondary | ICD-10-CM | POA: Diagnosis not present

## 2018-07-08 MED ORDER — ALBUTEROL SULFATE HFA 108 (90 BASE) MCG/ACT IN AERS: 2.0000 | INHALATION_SPRAY | RESPIRATORY_TRACT | 5 refills | Status: DC | PRN

## 2018-07-08 NOTE — Progress Notes (Signed)
Subjective:    Patient ID: [name], male    DOB: October 13, 1935, 82 y.o.   MRN: [id number]  Asthma  He complains of cough. There is no shortness of breath or wheezing. Pertinent negatives include no appetite change, ear pain, fever, headaches, postnasal drip, rhinorrhea, sneezing, sore throat or trouble swallowing. His past medical history is significant for asthma.   PMH: Hypertension, diabetes, stroke, COPD  ROV 06/07/17 --patient is 87 with a history of obstructive lung disease/fixed asthma.  We have followed [name] for a right lower lobe density in the aftermath of a pneumonia and pleural effusion.  He has had PET scans and CT scans that have been reassuring.  He had a chest x-ray 04/22/17 that showed blunting of his right costophrenic angle and some right pleural thickening but without significant interval change when he was in ED for dyspnea and chest pain.  Currently managed on Breo. Uses albuterol. His daughter is here, believes that his breathing is stable, but that he has been coughing a lot. He denies congestion or heart burn.   ROV 07/08/18 --[name] is 4 follows up today for his COPD/fixed asthma, right lower lobe density (in the aftermath of a pneumonia, effusion).  He had reassuring PET (2015) and CT scans.  A repeat scan was done on 01/23/2018 that I reviewed today.  This shows a stable area of right lower lobe rounded atelectasis with associated scar and some loculated pleural fluid.  Currently managed on Breo. He is able to exert, walk at his own pace indefinitely. Occasionally hears some wheeze w exertion. Minimal cough. Doesn't have an albuterol right now. Flu shot up to date.     Review of Systems  Constitutional: Negative for appetite change and fever.  HENT: Negative for congestion, dental problem, ear pain, nosebleeds, postnasal drip, rhinorrhea, sinus pressure, sneezing, sore throat and trouble swallowing.   Eyes: Negative for redness and itching.  Respiratory: Positive  for cough. Negative for chest tightness, shortness of breath and wheezing.   Cardiovascular: Negative for palpitations and leg swelling.  Gastrointestinal: Negative for nausea and vomiting.  Genitourinary: Negative for dysuria.  Musculoskeletal: Negative for joint swelling.  Skin: Negative for rash.  Neurological: Negative for headaches.  Hematological: Does not bruise/bleed easily.  Psychiatric/Behavioral: Negative for dysphoric mood. The patient is not nervous/anxious.        Objective:   Physical Exam Vitals:   07/08/18 0952  BP: 124/74  Pulse: 68  SpO2: 98%  Weight: 202 lb (91.6 kg)  Height: 6' (1.829 m)   Gen: Pleasant, well-nourished, in no distress,  normal affect  ENT: No lesions,  mouth clear,  oropharynx clear, no postnasal drip  Neck: No JVD, no stridor  Lungs: No use of accessory muscles, clear without rales or rhonchi, decreased at the R base, no crackles.   Cardiovascular: RRR, heart sounds normal, no murmur or gallops, no peripheral edema  Musculoskeletal: No deformities, no cyanosis or clubbing  Neuro: alert, non focal  Skin: Warm, no lesions or rashes     Assessment & Plan:  Atelectasis of right lung Right lower lobe rounded scar is stable on most recent scan July 2019.  He should not need any repeat scans unless there is a clinical change.  Intrinsic asthma Overall good functional capacity without any significant dyspnea.  Has had no flares.  He does not use albuterol but I would like [name] to have it available if he needs it.  Continue Breo and refill his albuterol.  Flu shot is up-to-date.  Please continue to take Breo 1 inhalation once daily every day.  Remember to rinse and gargle after use this medication. We will refill your albuterol inhaler.  Please keep this available to use 2 puffs if needed for shortness of breath, chest tightness, wheezing. Flu shot is up-to-date. Follow with Dr. Lamonte Sakai in 12 months or sooner if you have any  problems.  Baltazar Apo, MD, PhD 07/08/2018, 10:08 AM [facility] [phone] or if no answer [phone]

## 2018-07-08 NOTE — Addendum Note (Signed)
Addended by: Desmond Dike C on: 07/08/2018 10:10 AM   Modules accepted: Orders

## 2018-07-08 NOTE — Patient Instructions (Signed)
Your CT scan of the chest shows that your right lung scarring is stable, unchanged.  This is good news.  You should not need any repeat CT scans since it has been stable for several years. Please continue to take Breo 1 inhalation once daily every day.  Remember to rinse and gargle after use this medication. We will refill your albuterol inhaler.  Please keep this available to use 2 puffs if needed for shortness of breath, chest tightness, wheezing. Flu shot is up-to-date. Follow with Dr. Lamonte Sakai in 12 months or sooner if you have any problems.

## 2018-07-08 NOTE — Assessment & Plan Note (Signed)
Overall good functional capacity without any significant dyspnea.  Has had no flares.  He does not use albuterol but I would like him to have it available if he needs it.  Continue Breo and refill his albuterol.  Flu shot is up-to-date.  Please continue to take Breo 1 inhalation once daily every day.  Remember to rinse and gargle after use this medication. We will refill your albuterol inhaler.  Please keep this available to use 2 puffs if needed for shortness of breath, chest tightness, wheezing. Flu shot is up-to-date. Follow with Dr. Lamonte Sakai in 12 months or sooner if you have any problems.

## 2018-07-08 NOTE — Assessment & Plan Note (Signed)
Right lower lobe rounded scar is stable on most recent scan July 2019.  He should not need any repeat scans unless there is a clinical change.

## 2018-07-10 ENCOUNTER — Ambulatory Visit: Payer: Medicare HMO | Admitting: Pediatrics

## 2018-07-14 ENCOUNTER — Encounter: Payer: Self-pay | Admitting: Pediatrics

## 2018-07-14 ENCOUNTER — Ambulatory Visit (INDEPENDENT_AMBULATORY_CARE_PROVIDER_SITE_OTHER): Payer: Medicare HMO | Admitting: Pediatrics

## 2018-07-14 VITALS — BP 135/82 | HR 67 | Temp 97.4°F | Ht 72.0 in | Wt 204.8 lb

## 2018-07-14 DIAGNOSIS — E785 Hyperlipidemia, unspecified: Secondary | ICD-10-CM

## 2018-07-14 DIAGNOSIS — I10 Essential (primary) hypertension: Secondary | ICD-10-CM | POA: Diagnosis not present

## 2018-07-14 DIAGNOSIS — E1129 Type 2 diabetes mellitus with other diabetic kidney complication: Secondary | ICD-10-CM

## 2018-07-14 DIAGNOSIS — E1169 Type 2 diabetes mellitus with other specified complication: Secondary | ICD-10-CM | POA: Diagnosis not present

## 2018-07-14 DIAGNOSIS — Z794 Long term (current) use of insulin: Secondary | ICD-10-CM | POA: Diagnosis not present

## 2018-07-14 DIAGNOSIS — R809 Proteinuria, unspecified: Secondary | ICD-10-CM | POA: Diagnosis not present

## 2018-07-14 LAB — BAYER DCA HB A1C WAIVED: HB A1C: 7.7 % — AB (ref <7.0)

## 2018-07-14 NOTE — Progress Notes (Signed)
  Subjective:   Patient ID: [name], male    DOB: [birth year], 83 y.o.   MRN: [id number] CC: Medical Management of Chronic Issues  HPI: [name] is a 83 y.o. male   HTN: Taking medicine regularly.  No lightheadedness or dizziness.  No chest pain or shortness of breath  DM2: Eating some more sugary foods over the holidays, taking medicines regularly.  Trying to limit chocolates and sodas.  HLD: Tolerating statin, no side effects.  Asthma: Recent asthma exacerbation, symptoms have resolved.  Breathing has been back to normal.  He is on Breo, uses albuterol as needed.  Follows with Dr. Lamonte Sakai, pulmonology.  Relevant past medical, surgical, family and social history reviewed. Allergies and medications reviewed and updated. Social History   Tobacco Use  Smoking Status Never Smoker  Smokeless Tobacco Never Used   ROS: Per HPI   Objective:    BP 135/82   Pulse 67   Temp (!) 97.4 F (36.3 C) (Oral)   Ht 6' (1.829 m)   Wt 204 lb 12.8 oz (92.9 kg)   BMI 27.78 kg/m   Wt Readings from Last 3 Encounters:  07/14/18 204 lb 12.8 oz (92.9 kg)  07/08/18 202 lb (91.6 kg)  06/26/18 202 lb (91.6 kg)    Gen: NAD, alert, cooperative with exam, NCAT EYES: EOMI, no conjunctival injection, or no icterus ENT:   OP without erythema LYMPH: no cervical LAD CV: NRRR, normal S1/S2, no murmur, distal pulses 2+ b/l Resp: CTABL, no wheezes, normal WOB Abd: +BS, soft, NTND.  Ext: No edema, warm Neuro: Alert and oriented MSK: normal muscle bulk  Assessment & Plan:  [name] was seen today for medical management of chronic issues.  Diagnoses and all orders for this visit:  Type 2 diabetes mellitus with microalbuminuria, with long-term current use of insulin (HCC) A1c 7.7, up slightly from 7.5.  Continue current medicines.  Continue to avoid sugary foods. -     Bayer DCA Hb A1c Waived  Hyperlipidemia associated with type 2 diabetes mellitus (HCC) Stable, on statin.  Continue.  Essential  hypertension Stable, continue current medicines  Follow up plan: Return in about 3 months (around 10/13/2018). Assunta Found, MD [place]

## 2018-07-14 NOTE — Patient Instructions (Signed)
Call to schedule eye exam  

## 2018-09-23 ENCOUNTER — Other Ambulatory Visit: Payer: Self-pay

## 2018-09-23 ENCOUNTER — Ambulatory Visit (INDEPENDENT_AMBULATORY_CARE_PROVIDER_SITE_OTHER): Payer: Medicare HMO | Admitting: Family Medicine

## 2018-09-23 ENCOUNTER — Encounter: Payer: Self-pay | Admitting: Family Medicine

## 2018-09-23 VITALS — BP 133/90 | HR 78 | Temp 97.8°F | Ht 72.0 in | Wt 203.0 lb

## 2018-09-23 DIAGNOSIS — I10 Essential (primary) hypertension: Secondary | ICD-10-CM | POA: Diagnosis not present

## 2018-09-23 DIAGNOSIS — E0800 Diabetes mellitus due to underlying condition with hyperosmolarity without nonketotic hyperglycemic-hyperosmolar coma (NKHHC): Secondary | ICD-10-CM

## 2018-09-23 DIAGNOSIS — F0390 Unspecified dementia without behavioral disturbance: Secondary | ICD-10-CM | POA: Diagnosis not present

## 2018-09-23 DIAGNOSIS — E782 Mixed hyperlipidemia: Secondary | ICD-10-CM

## 2018-09-23 MED ORDER — MEMANTINE HCL 10 MG PO TABS: 10.0000 mg | ORAL_TABLET | Freq: Two times a day (BID) | ORAL | 3 refills | Status: DC

## 2018-09-23 NOTE — Progress Notes (Signed)
Subjective:  Patient ID: [name], male    DOB: Feb 01, 1936, 83 y.o.   MRN: [id number]  Chief Complaint:  Discuss Namenda   HPI: [name] is a 83 y.o. male presenting on 09/23/2018 for Discuss Namenda   1. Dementia without behavioral disturbance, unspecified dementia type ([place])   Pt presents today for follow up on dementia. Pts daughter is with him today. Daughter states pt has increased forgetfulness and agitation. States he has forgot the stove on at times and caused the smoke detector to go off. States he wanders at night and does not sleep. States he can not remember certain people or events. He is unable to carry out executive functions. He can perform ADL's without assistance. The pt feels he does not have dementia. States he is just forgetful. Pt is able to recall long term memories, no short term memory.   Pt is compliant with his medications. His wife fixes them for him and gives them to him daily. He denies chest pain, shortness of breath, leg swelling, visual changes, dizziness, palpitations, or focal deficits. No polyuria, polyphagia, or polydipsia. Does not diet or exercise on a regular basis.    Relevant past medical, surgical, family, and social history reviewed and updated as indicated.  Allergies and medications reviewed and updated.   Past Medical History:  Diagnosis Date  . Anxiety   . Choledocholithiasis 02/24/2013  . COPD (chronic obstructive pulmonary disease) ([place])   . CVA (cerebral vascular accident) ([place]) 2013   loss of memory  . Dementia ([place])   . Diabetes mellitus   . Hx-TIA (transient ischemic attack)   . Hyperlipidemia   . Hypertension     Past Surgical History:  Procedure Laterality Date  . CATARACT EXTRACTION W/PHACO Left 04/30/2016   Procedure: CATARACT EXTRACTION PHACO AND INTRAOCULAR LENS PLACEMENT LEFT EYE CDE=9.83;  Surgeon: Tonny Branch, MD;  Location: [facility];  Service: Ophthalmology;  Laterality: Left;  left  . CHOLECYSTECTOMY N/A  02/23/2013   Procedure: LAPAROSCOPIC CHOLECYSTECTOMY;  Surgeon: Jamesetta So, MD;  Location: [facility];  Service: General;  Laterality: N/A;  . ERCP N/A 02/24/2013   Procedure: ENDOSCOPIC RETROGRADE CHOLANGIOPANCREATOGRAPHY;  Surgeon: Daneil Dolin, MD;  Location: [facility];  Service: Endoscopy;  Laterality: N/A;  . ROTATOR CUFF REPAIR      Social History   Socioeconomic History  . Marital status: Married    Spouse name: [name]  . Number of children: 6  . Years of education: 11th  . Highest education level: Not on file  Occupational History  . Occupation: Retired  Scientific laboratory technician  . Financial resource strain: Not on file  . Food insecurity:    Worry: Not on file    Inability: Not on file  . Transportation needs:    Medical: Not on file    Non-medical: Not on file  Tobacco Use  . Smoking status: Never Smoker  . Smokeless tobacco: Never Used  Substance and Sexual Activity  . Alcohol use: No  . Drug use: No  . Sexual activity: Not Currently  Lifestyle  . Physical activity:    Days per week: Not on file    Minutes per session: Not on file  . Stress: Not on file  Relationships  . Social connections:    Talks on phone: Not on file    Gets together: Not on file    Attends religious service: Not on file    Active member of club or organization: Not  on file    Attends meetings of clubs or organizations: Not on file    Relationship status: Not on file  . Intimate partner violence:    Fear of current or ex partner: Not on file    Emotionally abused: Not on file    Physically abused: Not on file    Forced sexual activity: Not on file  Other Topics Concern  . Not on file  Social History Narrative   Patient lives at home with family.   Caffeine Use: 1 cup of coffee    Outpatient Encounter Medications as of 09/23/2018  Medication Sig  . ACCU-CHEK SOFTCLIX LANCETS lancets Check glucose twice a day  . albuterol (PROVENTIL HFA;VENTOLIN HFA) 108 (90 Base) MCG/ACT inhaler Inhale 2 puffs  into the lungs every 4 (four) hours as needed for wheezing or shortness of breath.  [name] Kitchen albuterol (PROVENTIL) (2.5 MG/3ML) 0.083% nebulizer solution Take 3 mLs (2.5 mg total) by nebulization every 6 (six) hours as needed for wheezing or shortness of breath.  [name] Kitchen aspirin EC 81 MG tablet Take 162 mg by mouth daily.   [name] Kitchen atorvastatin (LIPITOR) 40 MG tablet Take 1 tablet (40 mg total) by mouth daily.  . Blood Glucose Calibration (ACCU-CHEK AVIVA) SOLN Use with glucometer  . blood glucose meter kit and supplies KIT Daily. Onetouch Verio  . Blood Glucose Monitoring Suppl (ACCU-CHEK AVIVA PLUS) w/Device KIT Check glucose twice a day  . fluticasone furoate-vilanterol (BREO ELLIPTA) 100-25 MCG/INH AEPB Inhale 1 puff into the lungs daily.  [name] Kitchen glucose blood (ACCU-CHEK AVIVA PLUS) test strip Check glucose twice a day  . Insulin Pen Needle (PEN NEEDLES) 32G X 4 MM MISC 1 each by Does not apply route 2 (two) times daily.  [name] Kitchen losartan (COZAAR) 50 MG tablet Take 1 tablet (50 mg total) by mouth daily.  . TRESIBA FLEXTOUCH 100 UNIT/ML SOPN FlexTouch Pen Inject 0.16 mLs (16 Units total) into the skin daily at 10 pm.  . triamcinolone cream (KENALOG) 0.1 % Apply 1 application topically 2 (two) times daily.  . [DISCONTINUED] memantine (NAMENDA) 5 MG tablet Take 1 tablet (5 mg total) by mouth 2 (two) times daily.  . memantine (NAMENDA) 10 MG tablet Take 1 tablet (10 mg total) by mouth 2 (two) times daily for 30 days.  . [DISCONTINUED] doxycycline (VIBRA-TABS) 100 MG tablet Take 1 tablet (100 mg total) by mouth 2 (two) times daily.   No facility-administered encounter medications on file as of 09/23/2018.     Allergies  Allergen Reactions  . Ace Inhibitors Swelling    Review of Systems  Constitutional: Negative for activity change, appetite change, fatigue, fever and unexpected weight change.  Eyes: Negative for photophobia and visual disturbance.  Respiratory: Negative for cough, chest tightness and shortness of  breath.   Cardiovascular: Negative for chest pain, palpitations and leg swelling.  Gastrointestinal: Negative for abdominal pain, constipation, diarrhea, nausea and vomiting.  Endocrine: Negative for polydipsia, polyphagia and polyuria.  Genitourinary: Negative for decreased urine volume and difficulty urinating.  Musculoskeletal: Negative for arthralgias and myalgias.  Neurological: Negative for dizziness, tremors, seizures, syncope, facial asymmetry, speech difficulty, weakness, light-headedness, numbness and headaches.  Psychiatric/Behavioral: Positive for confusion.  All other systems reviewed and are negative.       Objective:  BP 133/90   Pulse 78   Temp 97.8 F (36.6 C) (Oral)   Ht 6' (1.829 m)   Wt 203 lb (92.1 kg)   BMI 27.53 kg/m    Wt Readings from Last  3 Encounters:  09/23/18 203 lb (92.1 kg)  07/14/18 204 lb 12.8 oz (92.9 kg)  07/08/18 202 lb (91.6 kg)    Physical Exam Vitals signs and nursing note reviewed.  Constitutional:      General: He is not in acute distress.    Appearance: Normal appearance. He is well-developed and well-groomed. He is not ill-appearing or toxic-appearing.  HENT:     Head: Normocephalic and atraumatic.     Right Ear: Hearing normal.     Left Ear: Hearing normal.     Mouth/Throat:     Lips: Pink.     Mouth: Mucous membranes are moist.     Pharynx: Oropharynx is clear. Uvula midline.  Eyes:     General: Lids are normal.     Extraocular Movements: Extraocular movements intact.     Conjunctiva/sclera: Conjunctivae normal.     Pupils: Pupils are equal, round, and reactive to light.  Neck:     Musculoskeletal: Full passive range of motion without pain.     Thyroid: No thyroid mass, thyromegaly or thyroid tenderness.     Vascular: No carotid bruit or JVD.     Trachea: Trachea and phonation normal.  Cardiovascular:     Rate and Rhythm: Normal rate and regular rhythm.     Heart sounds: Normal heart sounds. Heart sounds not distant.  No murmur. No friction rub. No gallop.   Pulmonary:     Effort: Pulmonary effort is normal.     Breath sounds: Normal breath sounds.  Musculoskeletal:     Right lower leg: No edema.     Left lower leg: No edema.  Skin:    General: Skin is warm and dry.     Capillary Refill: Capillary refill takes less than 2 seconds.  Neurological:     General: No focal deficit present.     Mental Status: He is alert and oriented to person, place, and time. Mental status is at baseline.     Cranial Nerves: Cranial nerves are intact.     Sensory: Sensation is intact.     Motor: Motor function is intact.     Coordination: Coordination is intact.     Gait: Gait is intact.     Deep Tendon Reflexes: Reflexes are normal and symmetric.  Psychiatric:        Attention and Perception: Attention and perception normal.        Mood and Affect: Mood and affect normal.        Speech: Speech normal.        Behavior: Behavior normal. Behavior is cooperative.        Thought Content: Thought content normal.        Cognition and Memory: Memory is impaired. He exhibits impaired recent memory. He does not exhibit impaired remote memory.     Results for orders placed or performed in visit on 07/14/18  Bayer DCA Hb A1c Waived  Result Value Ref Range   HB A1C (BAYER DCA - WAIVED) 7.7 (H) <7.0 %       Pertinent labs & imaging results that were available during my care of the patient were reviewed by me and considered in my medical decision making.  Assessment & Plan:  Simon was seen today for discuss namenda.  Diagnoses and all orders for this visit:  Dementia without behavioral disturbance, unspecified dementia type ([place]) Due to increasing symptoms, will increase Namenda to 10 mg BID. May consider additional medications if this is not beneficial. Report any  new or worsening symptoms. Will place referral to CCM.  -     memantine (NAMENDA) 10 MG tablet; Take 1 tablet (10 mg total) by mouth 2 (two) times daily for  30 days.     Continue all other maintenance medications.  Follow up plan: Return in about 1 month (around 10/24/2018), or if symptoms worsen or fail to improve, for dementia.  Educational handout given for dementia  The above assessment and management plan was discussed with the patient. The patient verbalized understanding of and has agreed to the management plan. Patient is aware to call the clinic if symptoms persist or worsen. Patient is aware when to return to the clinic for a follow-up visit. Patient educated on when it is appropriate to go to the emergency department.   Monia Pouch, [name]-C [facility] [phone]

## 2018-09-23 NOTE — Patient Instructions (Signed)
Dementia  Dementia is the loss of two or more brain functions, such as:  · Memory.  · Decision making.  · Behavior.  · Speaking.  · Thinking.  · Problem solving.  There are many types of dementia. The most common type is called progressive dementia. Progressive dementia gets worse with time and it is irreversible. An example of this type of dementia is Alzheimer disease.  What are the causes?  This condition may be caused by:  · Nerve cell damage in the brain.  · Genetic mutations.  · Certain medicines.  · Multiple small strokes.  · An infection, such as chronic meningitis.  · A metabolic problem, such as vitamin B12 deficiency or thyroid disease.  · Pressure on the brain, such as from a tumor or blood clot.  What are the signs or symptoms?  Symptoms of this condition include:  · Sudden changes in mood.  · Depression.  · Problems with balance.  · Changes in personality.  · Poor short-term memory.  · Agitation.  · Delusions.  · Hallucinations.  · Having a hard time:  ? Speaking thoughts.  ? Finding words.  ? Solving problems.  ? Doing familiar tasks.  ? Understanding familiar ideas.  How is this diagnosed?  This condition is diagnosed with an assessment by your health care provider. During this assessment, your health care provider will talk with you and your family, friends, or caregivers about your symptoms.  A thorough medical history will be taken, and you will have a physical exam and tests. Tests may include:  · Lab tests, such as blood or urine tests.  · Imaging tests, such as a CT scan, PET scan, or MRI.  · A lumbar puncture. This test involves removing and testing a small amount of the fluid that surrounds the brain and spinal cord.  · An electroencephalogram (EEG). In this test, small metal discs are used to measure electrical activity in the brain.  · Memory tests, cognitive tests, and neuropsychological tests. These tests evaluate brain function.  How is this treated?  Treatment depends on the cause of  the dementia. It may involve taking medicines that may help:  · To control the dementia.  · To slow down the disease.  · To manage symptoms.  In some cases, treating the cause of the dementia can improve symptoms, reverse symptoms, or slow down how quickly the dementia gets worse.  Your health care provider can help direct you to support groups, organizations, and other health care providers who can help with decisions about your care.  Follow these instructions at home:  Medicine  · Take over-the-counter and prescription medicines only as told by your health care provider.  · Avoid taking medicines that can affect thinking, such as pain or sleeping medicines.  Lifestyle    · Make healthy lifestyle choices:  ? Be physically active as told by your health care provider.  ? Do not use any tobacco products, such as cigarettes, chewing tobacco, and e-cigarettes. If you need help quitting, ask your health care provider.  ? Eat a healthy diet.  ? Practice stress-management techniques when you get stressed.  ? Stay social.  · Drink enough fluid to keep your urine clear or pale yellow.  · Make sure to get quality sleep. These tips can help you to get a good night's rest:  ? Avoid napping during the day.  ? Keep your sleeping area dark and cool.  ? Avoid exercising during the few   hours before you go to bed.  ? Avoid caffeine products in the evening.  General instructions  · Work with your health care provider to determine what you need help with and what your safety needs are.  · If you were given a bracelet that tracks your location, make sure to wear it.  · Keep all follow-up visits as told by your health care provider. This is important.  Contact a health care provider if:  · You have any new symptoms.  · You have problems with choking or swallowing.  · You have any symptoms of a different illness.  Get help right away if:  · You develop a fever.  · You have new or worsening confusion.  · You have new or worsening  sleepiness.  · You have a hard time staying awake.  · You or your family members become concerned for your safety.  This information is not intended to replace advice given to you by your health care provider. Make sure you discuss any questions you have with your health care provider.  Document Released: 12/19/2000 Document Revised: 11/03/2015 Document Reviewed: 03/23/2015  Elsevier Interactive Patient Education © 2019 [employer]

## 2018-09-24 ENCOUNTER — Ambulatory Visit: Payer: Self-pay | Admitting: Licensed Clinical Social Worker

## 2018-09-24 DIAGNOSIS — R809 Proteinuria, unspecified: Secondary | ICD-10-CM

## 2018-09-24 DIAGNOSIS — F0390 Unspecified dementia without behavioral disturbance: Secondary | ICD-10-CM

## 2018-09-24 DIAGNOSIS — Z794 Long term (current) use of insulin: Secondary | ICD-10-CM

## 2018-09-24 DIAGNOSIS — E1129 Type 2 diabetes mellitus with other diabetic kidney complication: Secondary | ICD-10-CM

## 2018-09-24 DIAGNOSIS — Z8673 Personal history of transient ischemic attack (TIA), and cerebral infarction without residual deficits: Secondary | ICD-10-CM

## 2018-09-24 DIAGNOSIS — I10 Essential (primary) hypertension: Secondary | ICD-10-CM

## 2018-09-24 NOTE — Patient Instructions (Signed)
Licensed Clinical Social Worker Visit Information  Materials provided: No  [name], were given information about Chronic Care Management services today including:  1. CCM service includes personalized support from designated clinical staff supervised by his physician, including individualized plan of care and coordination with other care providers 2. 24/7 contact phone numbers for assistance for urgent and routine care needs. 3. Service will only be billed when office clinical staff spend 20 minutes or more in a month to coordinate care. 4. Only one practitioner may furnish and bill the service in a calendar month. 5. The patient may stop CCM services at any time (effective at the end of the month) by phone call to the office staff. 6. The patient will be responsible for cost sharing (co-pay) of up to 20% of the service fee (after annual deductible is met).  Patient did not agree to services and wishes to consider information provided before deciding about enrollment in CCM services.    Follow Up Plan:  LCSW to call client/spouse of client in next 2 weeks to talk further with client/spouse about CCM program services.  The patient verbalized understanding of instructions provided today and declined a print copy of patient instruction materials.   [name] MSW, LCSW Licensed Clinical Social Worker [facility] Care Management [phone]

## 2018-09-24 NOTE — Chronic Care Management (AMB) (Signed)
  Care Management Note   [name] is a 83 y.o. year old male who is a primary care patient of Baruch Gouty, [place].[name] Kitchen The CM team was consulted for assistance with chronic disease management and care coordination.   I reached out to [name], by phone today.   Mr. [name], were given information about Chronic Care Management services today including:  1. CCM service includes personalized support from designated clinical staff supervised by his physician, including individualized plan of care and coordination with other care providers 2. 24/7 contact phone numbers for assistance for urgent and routine care needs. 3. Service will only be billed when office clinical staff spend 20 minutes or more in a month to coordinate care. 4. Only one practitioner may furnish and bill the service in a calendar month. 5. The patient may stop CCM services at any time (effective at the end of the month) by phone call to the office staff. 6. The patient will be responsible for cost sharing (co-pay) of up to 20% of the service fee (after annual deductible is met)  Patient did not agree to services and wishes to consider information provided before deciding about enrollment in CCM services.    Review of patient status, including review of consultants reports, relevant laboratory and other test results, and collaboration with appropriate care team members and the patient's provider was performed as part of comprehensive patient evaluation and provision of chronic care management services.   Follow Up Plan: LCSW to call client/spouse of client in next 2 weeks to talk further with client/spouse about CCM program services.  [name].Cia Garretson MSW, LCSW Licensed Clinical Social Worker [facility] [phone]

## 2018-10-07 ENCOUNTER — Ambulatory Visit (INDEPENDENT_AMBULATORY_CARE_PROVIDER_SITE_OTHER): Payer: Medicare HMO | Admitting: Licensed Clinical Social Worker

## 2018-10-07 DIAGNOSIS — I10 Essential (primary) hypertension: Secondary | ICD-10-CM | POA: Diagnosis not present

## 2018-10-07 DIAGNOSIS — Z8673 Personal history of transient ischemic attack (TIA), and cerebral infarction without residual deficits: Secondary | ICD-10-CM

## 2018-10-07 DIAGNOSIS — E119 Type 2 diabetes mellitus without complications: Secondary | ICD-10-CM

## 2018-10-07 DIAGNOSIS — F0390 Unspecified dementia without behavioral disturbance: Secondary | ICD-10-CM | POA: Diagnosis not present

## 2018-10-07 NOTE — Chronic Care Management (AMB) (Signed)
Care Management Note   [name] is a 83 y.o. year old male who is a primary care patient of [name], Connye Burkitt, [name]. The CM team was consulted for assistance with chronic disease management and care coordination.   I reached out to [name], by phone today.   [name], were given information about Chronic Care Management services today including:  1. CCM service includes personalized support from designated clinical staff supervised by his physician, including individualized plan of care and coordination with other care providers 2. 24/7 contact phone numbers for assistance for urgent and routine care needs. 3. Service will only be billed when office clinical staff spend 20 minutes or more in a month to coordinate care. 4. Only one practitioner may furnish and bill the service in a calendar month. 5. The patient may stop CCM services at any time (effective at the end of the month) by phone call to the office staff. 6. The patient will be responsible for cost sharing (co-pay) of up to 20% of the service fee (after annual deductible is met). Patient / spouse, [name], agreed to services and verbal consent obtained.    Review of patient status, including review of consultants reports, relevant laboratory and other test results, and collaboration with appropriate care team members and the patient's provider was performed as part of comprehensive patient evaluation and provision of chronic care management services.   GAD 7 : Generalized Anxiety Score 10/07/2018  Nervous, Anxious, on Edge 1  Control/stop worrying 0  Worry too much - different things 1  Trouble relaxing 1  Restless 1  Easily annoyed or irritable 0  Afraid - awful might happen 0  Total GAD 7 Score 4  Anxiety Difficulty Somewhat difficult    Depression screen [facility] 2/9 10/07/2018 09/23/2018 07/14/2018  Decreased Interest 1 0 0  Down, Depressed, Hopeless 1 0 0  PHQ - 2 Score 2 0 0  Altered  sleeping 1 - -  Tired, decreased energy 1 - -  Change in appetite 1 - -  Feeling bad or failure about yourself  0 - -  Trouble concentrating 1 - -  Moving slowly or fidgety/restless 1 - -  Suicidal thoughts 0 - -  PHQ-9 Score 7 - -  Difficult doing work/chores Somewhat difficult - -    Social Determinants of Health:Risk for Depression  Goals Addressed            This Visit's Progress   . Client wants to talk with someone about anxiety or depression management (pt-stated)       Current Barriers:  [name] Kitchen Medical issues management . Anxiety issues  Clinical Social Work Clinical Goal(s):  Over the next 30 days, client/spouse will work with LCSW to address concerns related to management of anxiety symptoms of client. Interventions: . Provided patient with information about CCM program services  . Talked with client/spouse about client use of relaxation techniques (taking walk,working in yard,being outdoors) . Talked with client/spouse about family support. . Encouraged client/spouse to talk with RN CM [name] about nursing needs of client . Talked with client/spouse about memory issues of client  Patient Self Care Activities:  . Attends all scheduled provider appointments . Performs ADL's independently . Calls provider office for new concerns or questions   Plan: Client/spouse to talk with RN CM to discuss nursing needs of client LCSW to call client/spouse in next 3 weeks to discuss client management of anxiety symptoms Client to  attend scheduled client medical appointments Client to use relaxation techniques to help manage anxiety symptoms.  Initial goal documentation     Follow Up Plan: LCSW to call client/spouse in next 3 weeks to discuss client management of anxiety symptoms.  [name].Sebastien Jackson MSW, LCSW Licensed Clinical Social Worker [facility] [phone]

## 2018-10-07 NOTE — Patient Instructions (Signed)
Licensed Clinical Social Worker Visit Information  Goals we discussed today:  Goals Addressed            This Visit's Progress   . Client wants to talk with someone about anxiety or depression management (pt-stated)       Current Barriers:  [name] Kitchen Medical issues management . Anxiety issues  Clinical Social Work Clinical Goal(s):  Over the next 30 days, client/spouse will work with LCSW to address concerns related to management of anxiety symptoms of client. Interventions: . Provided patient with information about CCM program services  . Talked with client/spouse about client use of relaxation techniques (taking walk,working in yard,being outdoors) . Talked with client/spouse about family support. . Encouraged client/spouse to talk with RN CM [name] about nursing needs of client . Talked with client/spouse about memory issues of client  Patient Self Care Activities:  . Attends all scheduled provider appointments . Performs ADL's independently . Calls provider office for new concerns or questions   Plan: Client/spouse to talk with RN CM to discuss nursing needs of client LCSW to call client/spouse in next 3 weeks to discuss client management of anxiety symptoms Client to attend scheduled client medical appointments Client to use relaxation techniques to help manage anxiety symptoms.  Initial goal documentation     Materials provided: No  Mr. [name] [name] were given information about Chronic Care Management services today including:  1. CCM service includes personalized support from designated clinical staff supervised by his physician, including individualized plan of care and coordination with other care providers 2. 24/7 contact phone numbers for assistance for urgent and routine care needs. 3. Service will only be billed when office clinical staff spend 20 minutes or more in a month to coordinate care. 4. Only one practitioner may furnish and bill the service in a  calendar month. 5. The patient may stop CCM services at any time (effective at the end of the month) by phone call to the office staff. 6. The patient will be responsible for cost sharing (co-pay) of up to 20% of the service fee (after annual deductible is met).  Patient [name], agreed to services and verbal consent obtained.   Follow Up Plan: LCSW to call client/spouse in next 3 weeks to talk with client/spouse about client management of anxiety symptoms  The patient [name], verbalized understanding of instructions provided today and declined a print copy of patient instruction materials.   [name].Mahamadou Weltz MSW, LCSW Licensed Clinical Social Worker [facility] [phone]

## 2018-10-13 ENCOUNTER — Encounter: Payer: Medicare HMO | Admitting: *Deleted

## 2018-10-20 ENCOUNTER — Ambulatory Visit: Payer: Medicare HMO | Admitting: *Deleted

## 2018-10-20 ENCOUNTER — Other Ambulatory Visit: Payer: Self-pay

## 2018-10-20 DIAGNOSIS — F0150 Vascular dementia without behavioral disturbance: Secondary | ICD-10-CM

## 2018-10-20 DIAGNOSIS — E1169 Type 2 diabetes mellitus with other specified complication: Secondary | ICD-10-CM

## 2018-10-20 DIAGNOSIS — Z794 Long term (current) use of insulin: Secondary | ICD-10-CM

## 2018-10-20 DIAGNOSIS — I10 Essential (primary) hypertension: Secondary | ICD-10-CM

## 2018-10-20 NOTE — Chronic Care Management (AMB) (Signed)
Chronic Care Management   RN Case Management Initial Telephone Outreach   10/20/2018 Name: [name] MRN: [id number] DOB: 1936/06/27  Referred by: Baruch Gouty, [name] Reason for referral : Chronic Care Management (RNCM initial telephone outreach)   CASE [name] is a 83 y.o. year old male who is a primary care patient of Baruch Gouty, [name]. The CCM team was consulted for assistance with chronic disease management and care coordination needs.    Review of patient status, including review of consultants reports, relevant laboratory and other test results, and collaboration with appropriate care team members and the patient's provider was performed as part of comprehensive patient evaluation and provision of chronic care management services.    I spoke with Christen Bame wife, [name], today by telephone. They have been talking with Legrand Como "Scott" Forrest, LCSW with the [facility] CCM Team  Regarding Mr Creswell's psychosocial needs and Nicki Reaper recommended that I speak with them to address any nursing care needs.   Goals Addressed    . "We want to manage his dementia and keep it from getting worse" (pt-stated)       Current Barriers:  [name] Kitchen Knowledge Deficits related to dementia disease process.  Nurse Case Manager Clinical Goal(s):  [name] Kitchen Over the next 30 days, patient will verbalize understanding of plan for dementia . Over the next 30 days, patient will work with Chief Strategy Officer and PCP to address needs related to dementia management.  Interventions:  . Evaluation of current treatment plan related to dementia and patient's adherence to plan as established by provider. . Advised patient to continue to stay physically and socially active.  o Physical activity and socialization are very important and can help decrease anxiety and depression and can have a positive impact on cognitive function.  o Wife reports that he walks daily to visit his son and his brother who both live close by.  o Patient enjoys  riding in the car but is not able to drive at this point. . Encouraged a healthy diet with lean proteins, low glycemic fruits, vegetables, and whole grains.  o Wife reports that he is not eating foods that he normally liked. He wants to eat out somewhere like [employer] everyday.  . Provided education to patient re: dementia vs Alzhemier's  Patient Self Care Activities:  . Currently UNABLE TO independently drive . Performs ADL's independently  . Has help from wife     . "We want to keep his blood sugar under control"       Current Barriers:  . Cognitive Deficits  . Non adherence to recommendations  Nurse Case Manager Clinical Goal(s):  [name] Kitchen Over the next 30 days, patient will verbalize understanding of plan for diabetes management. . Over the next 30 days, patient will work with PCP and RN Case Manager to address needs related to diabetes.  Interventions:  . Evaluation of current treatment plan related to diabetes management and patient's adherence to plan as established by provider. . Advised patient to increase water intake, decrease soda intake, and cut back on daily chocolate consumption. o Wife reports that he drinks 2 canned Pepsi daily and that he loves chocolate bars and eats at least one a day.   . Reviewed medications with patient and discussed [place]. . Discussed plans with patient for ongoing care management follow up and provided patient with direct contact information for care management team . Advised patient, providing education and rationale, to check cbg daily and record, calling [phone] for  findings outside established parameters.   o Wife reports readings are generally below 180  Patient Self Care Activities:  . Performs ADL's independently  . Has assistance from wife        Follow Up Plan The CM team will reach out to the patient again over the next 30 days.    [name], RN-BC, BSN Nurse Case Manager [place] [phone]

## 2018-10-20 NOTE — Patient Instructions (Signed)
Visit Information  Goals Addressed            This Visit's Progress   . "We want to keep his blood sugar under control" (pt-stated)       Current Barriers:  . Cognitive Deficits  . Non adherence to recommendations  Nurse Case Manager Clinical Goal(s):  [name] Kitchen Over the next 30 days, patient will verbalize understanding of plan for diabetes management. . Over the next 30 days, patient will work with PCP and RN Case Manager to address needs related to diabetes.  Interventions:  . Evaluation of current treatment plan related to diabetes management and patient's adherence to plan as established by provider. . Advised patient to increase water intake, decrease soda intake, and cut back on daily chocolate consumption. o Wife reports that he drinks 2 canned Pepsi daily and that he loves chocolate bars and eats at least one a day.   . Reviewed medications with patient and discussed [place]. . Discussed plans with patient for ongoing care management follow up and provided patient with direct contact information for care management team . Advised patient, providing education and rationale, to check cbg daily and record, calling [phone] for findings outside established parameters.   o Wife reports readings are generally below 180  Patient Self Care Activities:  . Performs ADL's independently  . Has assistance from wife  Initial goal documentation      . "We want to manage his dementia and keep it from getting worse" (pt-stated)       Current Barriers:  [name] Kitchen Knowledge Deficits related to dementia disease process.  Nurse Case Manager Clinical Goal(s):  [name] Kitchen Over the next 30 days, patient will verbalize understanding of plan for dementia . Over the next 30 days, patient will work with Chief Strategy Officer and PCP to address needs related to dementia management.  Interventions:  . Evaluation of current treatment plan related to dementia and patient's adherence to plan as established by  provider. . Advised patient to continue to stay physically and socially active.  o Physical activity and socialization are very important and can help decrease anxiety and depression and can have a positive impact on cognitive function.  o Wife reports that he walks daily to visit his son and his brother who both live close by.  o Patient enjoys riding in the car but is not able to drive at this point. . Encouraged a healthy diet with lean proteins, low glycemic fruits, vegetables, and whole grains.  o Wife reports that he is not eating foods that he normally liked. He wants to eat out somewhere like [employer] everyday.  . Provided education to patient re: dementia vs Alzhemier's  Patient Self Care Activities:  . Currently UNABLE TO independently drive . Performs ADL's independently  . Has help from wife  Initial goal documentation         The patient verbalized understanding of instructions provided today and declined a print copy of patient instruction materials.   The CM team will reach out to the patient again over the next 30 days.   [name], RN-BC, BSN Nurse Case Manager [place] [phone]

## 2018-10-27 ENCOUNTER — Encounter: Payer: Self-pay | Admitting: Family Medicine

## 2018-10-27 ENCOUNTER — Other Ambulatory Visit: Payer: Self-pay

## 2018-10-27 ENCOUNTER — Ambulatory Visit (INDEPENDENT_AMBULATORY_CARE_PROVIDER_SITE_OTHER): Payer: Medicare HMO | Admitting: Licensed Clinical Social Worker

## 2018-10-27 ENCOUNTER — Ambulatory Visit (INDEPENDENT_AMBULATORY_CARE_PROVIDER_SITE_OTHER): Payer: Medicare HMO | Admitting: Family Medicine

## 2018-10-27 DIAGNOSIS — F0150 Vascular dementia without behavioral disturbance: Secondary | ICD-10-CM | POA: Diagnosis not present

## 2018-10-27 DIAGNOSIS — I10 Essential (primary) hypertension: Secondary | ICD-10-CM | POA: Diagnosis not present

## 2018-10-27 DIAGNOSIS — E1169 Type 2 diabetes mellitus with other specified complication: Secondary | ICD-10-CM | POA: Diagnosis not present

## 2018-10-27 DIAGNOSIS — Z794 Long term (current) use of insulin: Secondary | ICD-10-CM | POA: Diagnosis not present

## 2018-10-27 DIAGNOSIS — E785 Hyperlipidemia, unspecified: Secondary | ICD-10-CM | POA: Diagnosis not present

## 2018-10-27 DIAGNOSIS — Z8673 Personal history of transient ischemic attack (TIA), and cerebral infarction without residual deficits: Secondary | ICD-10-CM

## 2018-10-27 MED ORDER — DONEPEZIL HCL 5 MG PO TABS: 5.0000 mg | ORAL_TABLET | Freq: Every day | ORAL | 3 refills | Status: DC

## 2018-10-27 NOTE — Progress Notes (Signed)
Virtual Visit via telephone Note Due to COVID-19, visit is conducted virtually and was requested by patient. This visit type was conducted due to national recommendations for restrictions regarding the COVID-19 Pandemic (e.g. social distancing) in an effort to limit this patient's exposure and mitigate transmission in our community.  Due to her co-morbid illnesses, this patient is at least at moderate risk for complications without adequate follow up.  This format is felt to be most appropriate for this patient at this time.  All issues noted in this document were discussed and addressed.  A physical exam was not performed with this format.   I connected with [name] and his wife [name] on 10/27/18 at 1405 by telephone and verified that I am speaking with the correct person using two identifiers. [name] is currently located at home and family is currently with them during visit. The provider, Monia Pouch, [name] is located in their office at time of visit.  I discussed the limitations, risks, security and privacy concerns of performing an evaluation and management service by telephone and the availability of in person appointments. I also discussed with the patient that there may be a patient responsible charge related to this service. The patient expressed understanding and agreed to proceed.  Subjective:  Patient ID: [name], male    DOB: 1936-05-21, 83 y.o.   MRN: [id number]  Chief Complaint:  Dementia   HPI: [name] is a 83 y.o. male presenting on 10/27/2018 for Dementia   Pt visit today for reevaluation of dementia after increasing Namenda on 09/23/2018. Wife states his confusion is increasing and he is wandering at night. States he still has agitation. States this is worse if he is redirected or told to do something. States he is unable to perform executive ADL but is still able to feed and bathe self. Still able to recall long term memory but lacks short term memory.     Relevant past medical, surgical, family, and social history reviewed and updated as indicated.  Allergies and medications reviewed and updated.   Past Medical History:  Diagnosis Date  . Anxiety   . Choledocholithiasis 02/24/2013  . COPD (chronic obstructive pulmonary disease) ([place])   . CVA (cerebral vascular accident) ([place]) 2013   loss of memory  . Dementia ([place])   . Diabetes mellitus   . Hx-TIA (transient ischemic attack)   . Hyperlipidemia   . Hypertension     Past Surgical History:  Procedure Laterality Date  . CATARACT EXTRACTION W/PHACO Left 04/30/2016   Procedure: CATARACT EXTRACTION PHACO AND INTRAOCULAR LENS PLACEMENT LEFT EYE CDE=9.83;  Surgeon: Tonny Branch, MD;  Location: [facility];  Service: Ophthalmology;  Laterality: Left;  left  . CHOLECYSTECTOMY N/A 02/23/2013   Procedure: LAPAROSCOPIC CHOLECYSTECTOMY;  Surgeon: Jamesetta So, MD;  Location: [facility];  Service: General;  Laterality: N/A;  . ERCP N/A 02/24/2013   Procedure: ENDOSCOPIC RETROGRADE CHOLANGIOPANCREATOGRAPHY;  Surgeon: Daneil Dolin, MD;  Location: [facility];  Service: Endoscopy;  Laterality: N/A;  . ROTATOR CUFF REPAIR      Social History   Socioeconomic History  . Marital status: Married    Spouse name: [name]  . Number of children: 6  . Years of education: 11th  . Highest education level: Not on file  Occupational History  . Occupation: Retired  Scientific laboratory technician  . Financial resource strain: Not very hard  . Food insecurity:    Worry: Never true    Inability: Never  true  . Transportation needs:    Medical: No    Non-medical: No  Tobacco Use  . Smoking status: Never Smoker  . Smokeless tobacco: Never Used  Substance and Sexual Activity  . Alcohol use: No  . Drug use: No  . Sexual activity: Not Currently  Lifestyle  . Physical activity:    Days per week: 0 days    Minutes per session: 0 min  . Stress: Only a little  Relationships  . Social connections:    Talks on phone: More than three  times a week    Gets together: More than three times a week    Attends religious service: 1 to 4 times per year    Active member of club or organization: No    Attends meetings of clubs or organizations: Never    Relationship status: Married  . Intimate partner violence:    Fear of current or ex partner: No    Emotionally abused: No    Physically abused: No    Forced sexual activity: No  Other Topics Concern  . Not on file  Social History Narrative   Patient lives at home with family.   Caffeine Use: 1 cup of coffee and 2 can pepsi    Outpatient Encounter Medications as of 10/27/2018  Medication Sig  . ACCU-CHEK SOFTCLIX LANCETS lancets Check glucose twice a day  . albuterol (PROVENTIL HFA;VENTOLIN HFA) 108 (90 Base) MCG/ACT inhaler Inhale 2 puffs into the lungs every 4 (four) hours as needed for wheezing or shortness of breath.  [name] Kitchen albuterol (PROVENTIL) (2.5 MG/3ML) 0.083% nebulizer solution Take 3 mLs (2.5 mg total) by nebulization every 6 (six) hours as needed for wheezing or shortness of breath.  [name] Kitchen aspirin EC 81 MG tablet Take 162 mg by mouth daily.   [name] Kitchen atorvastatin (LIPITOR) 40 MG tablet Take 1 tablet (40 mg total) by mouth daily.  . Blood Glucose Calibration (ACCU-CHEK AVIVA) SOLN Use with glucometer  . blood glucose meter kit and supplies KIT Daily. Onetouch Verio  . Blood Glucose Monitoring Suppl (ACCU-CHEK AVIVA PLUS) w/Device KIT Check glucose twice a day  . donepezil ([name]) 5 MG tablet Take 1 tablet (5 mg total) by mouth at bedtime for 30 days.  . fluticasone furoate-vilanterol (BREO ELLIPTA) 100-25 MCG/INH AEPB Inhale 1 puff into the lungs daily.  [name] Kitchen glucose blood (ACCU-CHEK AVIVA PLUS) test strip Check glucose twice a day  . Insulin Pen Needle (PEN NEEDLES) 32G X 4 MM MISC 1 each by Does not apply route 2 (two) times daily.  [name] Kitchen losartan (COZAAR) 50 MG tablet Take 1 tablet (50 mg total) by mouth daily.  . memantine (NAMENDA) 10 MG tablet Take 1 tablet (10 mg total) by  mouth 2 (two) times daily for 30 days.  . Nebulizers (COMP AIR COMPRESSOR NEBULIZER) MISC   . TRESIBA FLEXTOUCH 100 UNIT/ML SOPN FlexTouch Pen Inject 0.16 mLs (16 Units total) into the skin daily at 10 pm.  . triamcinolone cream (KENALOG) 0.1 % Apply 1 application topically 2 (two) times daily.   No facility-administered encounter medications on file as of 10/27/2018.     Allergies  Allergen Reactions  . Ace Inhibitors Swelling    Review of Systems  Constitutional: Negative for activity change, appetite change, chills, fatigue, fever and unexpected weight change.  Respiratory: Negative for shortness of breath.   Cardiovascular: Negative for chest pain, palpitations and leg swelling.  Gastrointestinal: Negative for abdominal pain, constipation, diarrhea, nausea and vomiting.  Musculoskeletal: Negative for  arthralgias, joint swelling and myalgias.  Neurological: Negative for dizziness, weakness and headaches.  Psychiatric/Behavioral: Positive for agitation and confusion.  All other systems reviewed and are negative.        Observations/Objective: No vital signs or physical exam, this was a telephone or virtual health encounter.  Pt alert and oriented, answers all questions appropriately, and able to speak in full sentences.    Assessment and Plan: Govani was seen today for dementia.  Diagnoses and all orders for this visit:  Vascular dementia without behavioral disturbance ([place]) Due to increasing symptoms and agitation, will add [name] nightly. Report any new or worsening symptoms.  -     donepezil ([name]) 5 MG tablet; Take 1 tablet (5 mg total) by mouth at bedtime for 30 days.     Follow Up Instructions: Return in about 1 month (around 11/26/2018), or if symptoms worsen or fail to improve, for dementia.    I discussed the assessment and treatment plan with the patient. The patient was provided an opportunity to ask questions and all were answered. The patient agreed  with the plan and demonstrated an understanding of the instructions.   The patient was advised to call back or seek an in-person evaluation if the symptoms worsen or if the condition fails to improve as anticipated.  The above assessment and management plan was discussed with the patient. The patient verbalized understanding of and has agreed to the management plan. Patient is aware to call the clinic if symptoms persist or worsen. Patient is aware when to return to the clinic for a follow-up visit. Patient educated on when it is appropriate to go to the emergency department.    I provided 15 minutes of non-face-to-face time during this encounter. The call started at 1405. The call ended at 1420.   Monia Pouch, [name]-C [facility] [address] [place], [place] [address] [phone]

## 2018-10-27 NOTE — Chronic Care Management (AMB) (Signed)
  Care Management Note   [name] is a 83 y.o. year old male who is a primary care patient of [name], Connye Burkitt, [name]. The CM team was consulted for assistance with chronic disease management and care coordination.   I reached out to [name] by phone today.   Review of patient status, including review of consultants reports, relevant laboratory and other test results, and collaboration with appropriate care team members and the patient's provider was performed as part of comprehensive patient evaluation and provision of chronic care management services.   Social Determinants of Health:Risk for Depression; Dementia diagnosis (memory issues, occasional agitation))    Chronic Care Management from 10/07/2018 in [place]  PHQ-9 Total Score  7     Goals Addressed            This Visit's Progress   . Client wants to talk with someone about anxiety or depression management (pt-stated)       Current Barriers:  [name] Kitchen Medical issues management . Anxiety issues  Clinical Social Work Clinical Goal(s):  Over the next 30 days, client/spouse will work with LCSW to address concerns related to management of anxiety symptoms of client. Interventions: . Talked with client/spouse about client use of relaxation techniques (taking walk,working in yard,being outdoors, watching TV) . Talked with client/spouse about family support.(son lives nearby) . Encouraged client/spouse to talk with RN CM [name] about nursing needs of client . Talked with client/spouse about memory issues of client . Talked with client/spouse about client's missing working more outdoors (client worked as a Psychologist, sport and exercise)  Patient Self Care Activities:  . Attends all scheduled provider appointments . Performs ADL's independently . Calls provider office for new concerns or questions   Plan: Client/spouse to talk with RN CM to discuss nursing needs of client LCSW to call client/spouse in next 3 weeks to  discuss client management of anxiety symptoms Client to attend scheduled client medical appointments Client to use relaxation techniques to help manage anxiety symptoms (watching TV, walking outdoors, doing some yard work when able).  Initial goal documentation    Follow Up Plan: LCSW to call client or spouse of client in next 3 weeks to discuss client management of anxiety symptoms.  [name] MSW, LCSW Licensed Clinical Social Worker [facility] [phone]

## 2018-10-27 NOTE — Patient Instructions (Signed)
Licensed Clinical Social Worker Visit Information  Goals we discussed today:  Goals Addressed            This Visit's Progress   . Client wants to talk with someone about anxiety or depression management (pt-stated)       Current Barriers:  [name] Kitchen Medical issues management . Anxiety issues  Clinical Social Work Clinical Goal(s):  Over the next 30 days, client/spouse will work with LCSW to address concerns related to management of anxiety symptoms of client. Interventions: . Talked with client/spouse about client use of relaxation techniques (taking walk,working in yard,being outdoors,watching TV) . Talked with client/spouse about family support.(son lives nearby) . Encouraged client/spouse to talk with RN CM [name] about nursing needs of client . Talked with client/spouse about memory issues of client  Patient Self Care Activities:  . Attends all scheduled provider appointments . Performs ADL's independently . Calls provider office for new concerns or questions   Plan: Client/spouse to talk with RN CM to discuss nursing needs of client LCSW to call client/spouse in next 3 weeks to discuss client management of anxiety symptoms Client to attend scheduled client medical appointments Client to use relaxation techniques to help manage anxiety symptoms.  Initial goal documentation    Materials Provided: No  Follow Up Plan: LCSW to call client or spouse of client in next 3 weeks to discuss client management of anxiety symptoms.  The patient/spouse verbalized understanding of instructions provided today and declined a print copy of patient instruction materials.   [name].Zacory Fiola MSW, LCSW Licensed Clinical Social Worker [facility] [phone]

## 2018-10-31 ENCOUNTER — Telehealth: Payer: Medicare HMO

## 2018-11-03 ENCOUNTER — Ambulatory Visit: Payer: Medicare HMO | Admitting: *Deleted

## 2018-11-03 ENCOUNTER — Other Ambulatory Visit: Payer: Self-pay

## 2018-11-03 DIAGNOSIS — I6930 Unspecified sequelae of cerebral infarction: Secondary | ICD-10-CM

## 2018-11-03 DIAGNOSIS — F0150 Vascular dementia without behavioral disturbance: Secondary | ICD-10-CM

## 2018-11-03 DIAGNOSIS — E0800 Diabetes mellitus due to underlying condition with hyperosmolarity without nonketotic hyperglycemic-hyperosmolar coma (NKHHC): Secondary | ICD-10-CM

## 2018-11-03 NOTE — Patient Instructions (Signed)
Visit Information  Goals Addressed            This Visit's Progress     Patient Stated   . "We want to keep his blood sugar under control" (pt-stated)       Current Barriers:  . Cognitive Deficits  . Non adherence to recommendations  Nurse Case Manager Clinical Goal(s):  [name] Kitchen Over the next 30 days, patient will verbalize understanding of plan for diabetes management. . Over the next 30 days, patient will work with PCP and RN Case Manager to address needs related to diabetes.  . Over the next 90 days, patient will hgb A1C decreasing with of a goal of < 7.  Interventions:  . Evaluation of current treatment plan related to diabetes management and patient's adherence to plan as established by provider. . Advised patient to increase water intake, decrease soda intake, and cut back on daily chocolate consumption. o Wife reports that he drinks 2 canned Pepsi daily and that he loves chocolate bars and eats at least one a day.   . Reviewed medications with patient and discussed [place]. . Discussed plans with patient for ongoing care management follow up and provided patient with direct contact information for care management team . Advised patient, providing education and rationale, to check cbg daily and record, calling [phone] for findings outside established parameters.   o Wife reports readings are generally below 180  Patient Self Care Activities:  . Performs ADL's independently  . Has assistance from wife  Please see past updates related to this goal by clicking on the "Past Updates" button in the selected goal       . "We want to manage his dementia and keep it from getting worse" (pt-stated)       Current Barriers:  [name] Kitchen Knowledge Deficits related to dementia disease process.  Nurse Case Manager Clinical Goal(s):  [name] Kitchen Over the next 30 days, patient will verbalize understanding of plan for dementia  Over the next 60 days, patient and wife will work with PCP regarding the  effectiveness of Namenda and [name].  Interventions:  . Evaluation of current treatment plan related to dementia and patient's adherence to plan as established by provider. . Advised patient to continue to stay physically and socially active.  o Physical activity and socialization are very important and can help decrease anxiety and depression and can have a positive impact on cognitive function.  o Wife reports that he walks daily to visit his son and his brother who both live close by.  o Patient enjoys riding in the car but is not able to drive at this point. . Encouraged a healthy diet with lean proteins, low glycemic fruits, vegetables, and whole grains.  o Wife reports that he is not eating foods that he normally liked. He wants to eat out somewhere like [employer] everyday.  . Provided education to patient re: dementia vs Alzhemier's  Patient Self Care Activities:  . Currently UNABLE TO independently drive . Performs ADL's independently  . Has help from wife  Please see past updates related to this goal by clicking on the "Past Updates" button in the selected goal          The patient verbalized understanding of instructions provided today and declined a print copy of patient instruction materials.   The CM team will reach out to the patient again over the next 30 days.   [name], RN-BC, BSN Nurse Case Manager [place] [phone]

## 2018-11-03 NOTE — Chronic Care Management (AMB) (Signed)
Chronic Care Management   RN Follow Up Note   11/03/2018 Name: DUVID SMALLS MRN: [id number] DOB: Jul 28, 1935  Referred by: Baruch Gouty, [name] Reason for referral : Chronic Care Management (RN Consult)   LAREN WHALING is a 83 y.o. year old male who is a primary care patient of [name], Connye Burkitt, [name]. The CCM team was consulted for assistance with chronic disease management and care coordination needs.    Review of patient status, including review of consultants reports, relevant laboratory and other test results, and collaboration with appropriate care team members and the patient's provider was performed as part of comprehensive patient evaluation and provision of chronic care management services.    Goals Addressed      . "We want to keep his blood sugar under control" (pt-stated)       Current Barriers:  . Cognitive Deficits  . Non adherence to recommendations  Nurse Case Manager Clinical Goal(s):  [name] Kitchen Over the next 30 days, patient will verbalize understanding of plan for diabetes management. . Over the next 30 days, patient will work with PCP and RN Case Manager to address needs related to diabetes.  . Over the next 90 days, patient will hgb A1C decreasing with of a goal of < 7.  Interventions:  . Evaluation of current treatment plan related to diabetes management and patient's adherence to plan as established by provider. . Advised patient to increase water intake, decrease soda intake, and cut back on daily chocolate consumption. o Wife reports that he drinks 2 canned Pepsi daily and that he loves chocolate bars and eats at least one a day.   . Reviewed medications with patient and discussed [place]. . Discussed plans with patient for ongoing care management follow up and provided patient with direct contact information for care management team . Advised patient, providing education and rationale, to check cbg daily and record, calling [phone] for findings outside established  parameters.   o Wife reports readings are generally below 180  Patient Self Care Activities:  . Performs ADL's independently  . Has assistance from wife      . "We want to manage his dementia and keep it from getting worse" (pt-stated)       Current Barriers:  [name] Kitchen Knowledge Deficits related to dementia disease process.  Nurse Case Manager Clinical Goal(s):  [name] Kitchen Over the next 30 days, patient will verbalize understanding of plan for dementia  Over the next 60 days, patient and wife will work with PCP regarding the effectiveness of Namenda and [name].  Interventions:  . Evaluation of current treatment plan related to dementia and patient's adherence to plan as established by provider. . Advised patient to continue to stay physically and socially active.  o Physical activity and socialization are very important and can help decrease anxiety and depression and can have a positive impact on cognitive function.  o Wife reports that he walks daily to visit his son and his brother who both live close by.  o Patient enjoys riding in the car but is not able to drive at this point. . Encouraged a healthy diet with lean proteins, low glycemic fruits, vegetables, and whole grains.  o Wife reports that he is not eating foods that he normally liked. He wants to eat out somewhere like [employer] everyday.  . Provided education to patient re: dementia vs Alzhemier's  Patient Self Care Activities:  . Currently UNABLE TO independently drive . Performs ADL's independently  . Has help from  wife       Plan The CM team will reach out to the patient again over the next 30 days.    [name], RN-BC, BSN Nurse Case Manager [place] [phone]

## 2018-11-10 ENCOUNTER — Ambulatory Visit: Payer: Medicare HMO | Admitting: *Deleted

## 2018-11-10 ENCOUNTER — Other Ambulatory Visit: Payer: Self-pay

## 2018-11-10 DIAGNOSIS — Z794 Long term (current) use of insulin: Principal | ICD-10-CM

## 2018-11-10 DIAGNOSIS — R809 Proteinuria, unspecified: Principal | ICD-10-CM

## 2018-11-10 DIAGNOSIS — E1129 Type 2 diabetes mellitus with other diabetic kidney complication: Secondary | ICD-10-CM

## 2018-11-10 MED ORDER — ATORVASTATIN CALCIUM 40 MG PO TABS: 40.0000 mg | ORAL_TABLET | Freq: Every day | ORAL | 1 refills | Status: DC

## 2018-11-10 NOTE — Progress Notes (Signed)
[place] VISIT  11/10/2018  Telephone Visit Disclaimer This Medicare AWV was conducted by telephone due to national recommendations for restrictions regarding the COVID-19 Pandemic (e.g. social distancing).  I verified, using two identifiers, that I am speaking with [name] or their authorized healthcare agent. I discussed the limitations, risks, security, and privacy concerns of performing an evaluation and management service by telephone and the potential availability of an in-person appointment in the future. The patient expressed understanding and agreed to proceed.   Subjective:  [name] is a 83 y.o. male patient of [name], Connye Burkitt, [name]   who had a Medicare Annual Wellness Visit today via telephone. Tori has been diagnosed with Dementia and the majority of this visit was discussed with his wife, [name]. Nader is Retired and lives with his wife and oldest daughter. he has 6 children. he reports that he is somewhat socially active and does interact with friends/family regularly. he is minimally physically active and enjoys gardening and watching baseball on television.   Patient Care Team: Baruch Gouty, [name] as PCP - General (Family Medicine) Shea Evans, [name], LCSW as Social Worker (Licensed Clinical Social Worker) Cori Razor, Delice Bison, RN as Case Manager Collene Gobble, MD as Consulting Physician (Pulmonary Disease)  Advanced Directives 11/10/2018 10/10/2017 10/09/2017 04/27/2016 02/12/2016 10/24/2015 09/01/2015  Does Patient Have a Medical Advance Directive? No - No No No No No  Type of Advance Directive - - - - - - -  Would patient like information on creating a medical advance directive? No - Patient declined Yes (MAU/Ambulatory/Procedural Areas - Information given) - No - patient declined information - No - patient declined information No - patient declined information  Pre-existing out of facility DNR order (yellow form or pink MOST form) - - - - - - -     Hospital Utilization Over the Past 12 Months: # of hospitalizations or ER visits: 0 # of surgeries: 0  Review of Systems    Patient reports that his overall health is unchanged compared to last year.  Patient Reported Readings (BP, Pulse, CBG, Weight, etc) BP 135/82   Pulse 78   Ht 6' (1.829 m)   Wt 203 lb (92.1 kg)   BMI 27.53 kg/m    Review of Systems: Negative except for dementia.  All other systems negative.  Pain Assessment       Current Medications & Allergies (verified) Allergies as of 11/10/2018      Reactions   Ace Inhibitors Swelling      Medication List       Accurate as of Nov 10, 2018 10:44 AM. Always use your most recent med list.        Accu-Chek Aviva Plus w/Device Kit Check glucose twice a day   Accu-Chek Aviva Soln Use with glucometer   Accu-Chek Softclix Lancets lancets Check glucose twice a day   albuterol (2.5 MG/3ML) 0.083% nebulizer solution Commonly known as:  PROVENTIL Take 3 mLs (2.5 mg total) by nebulization every 6 (six) hours as needed for wheezing or shortness of breath.   albuterol 108 (90 Base) MCG/ACT inhaler Commonly known as:  VENTOLIN HFA Inhale 2 puffs into the lungs every 4 (four) hours as needed for wheezing or shortness of breath.   aspirin EC 81 MG tablet Take 162 mg by mouth daily.   atorvastatin 40 MG tablet Commonly known as:  LIPITOR Take 1 tablet (40 mg total) by mouth daily.   blood glucose  meter kit and supplies Kit Daily. Onetouch Verio   Surveyor, quantity Misc   donepezil 5 MG tablet Commonly known as:  [name] Take 1 tablet (5 mg total) by mouth at bedtime for 30 days.   fluticasone furoate-vilanterol 100-25 MCG/INH Aepb Commonly known as:  Breo Ellipta Inhale 1 puff into the lungs daily.   glucose blood test strip Commonly known as:  Accu-Chek Aviva Plus Check glucose twice a day   losartan 50 MG tablet Commonly known as:  COZAAR Take 1 tablet (50 mg total) by mouth daily.    memantine 10 MG tablet Commonly known as:  NAMENDA Take 1 tablet (10 mg total) by mouth 2 (two) times daily for 30 days.   Pen Needles 32G X 4 MM Misc 1 each by Does not apply route 2 (two) times daily.   Tyler Aas FlexTouch 100 UNIT/ML Sopn FlexTouch Pen Generic drug:  insulin degludec Inject 0.16 mLs (16 Units total) into the skin daily at 10 pm.       History (reviewed): Past Medical History:  Diagnosis Date  . Anxiety   . Asthma   . Choledocholithiasis 02/24/2013  . COPD (chronic obstructive pulmonary disease) ([place])   . CVA (cerebral vascular accident) ([place]) 2013   loss of memory  . Dementia ([place])   . Diabetes mellitus   . Hx-TIA (transient ischemic attack)   . Hyperlipidemia   . Hypertension    Past Surgical History:  Procedure Laterality Date  . CATARACT EXTRACTION W/PHACO Left 04/30/2016   Procedure: CATARACT EXTRACTION PHACO AND INTRAOCULAR LENS PLACEMENT LEFT EYE CDE=9.83;  Surgeon: Tonny Branch, MD;  Location: [facility];  Service: Ophthalmology;  Laterality: Left;  left  . CHOLECYSTECTOMY N/A 02/23/2013   Procedure: LAPAROSCOPIC CHOLECYSTECTOMY;  Surgeon: Jamesetta So, MD;  Location: [facility];  Service: General;  Laterality: N/A;  . ERCP N/A 02/24/2013   Procedure: ENDOSCOPIC RETROGRADE CHOLANGIOPANCREATOGRAPHY;  Surgeon: Daneil Dolin, MD;  Location: [facility];  Service: Endoscopy;  Laterality: N/A;  . ROTATOR CUFF REPAIR Right    Family History  Problem Relation Age of Onset  . Hypertension Mother   . Hypertension Father   . Diabetes Sister   . Diabetes Sister   . Heart disease Brother   . Emphysema Brother   . Colon cancer Neg Hx    Social History   Socioeconomic History  . Marital status: Married    Spouse name: [name]  . Number of children: 6  . Years of education: 11th  . Highest education level: Not on file  Occupational History  . Occupation: Retired    Comment: security - Marine scientist   Social Needs  . Financial resource strain: Not very hard   . Food insecurity:    Worry: Never true    Inability: Never true  . Transportation needs:    Medical: No    Non-medical: No  Tobacco Use  . Smoking status: Never Smoker  . Smokeless tobacco: Never Used  Substance and Sexual Activity  . Alcohol use: No  . Drug use: No  . Sexual activity: Not Currently  Lifestyle  . Physical activity:    Days per week: 0 days    Minutes per session: 0 min  . Stress: Only a little  Relationships  . Social connections:    Talks on phone: More than three times a week    Gets together: More than three times a week    Attends religious service: 1 to 4 times per  year    Active member of club or organization: No    Attends meetings of clubs or organizations: Never    Relationship status: Married  Other Topics Concern  . Not on file  Social History Narrative   Patient lives at home with wife, [name] = oldest daughter lives there also    Caffeine Use: 1 cup of coffee and 2 can pepsi    Activities of Daily Living In your present state of health, do you have any difficulty performing the following activities: 11/10/2018 10/07/2018  Hearing? N N  Vision? Y N  Difficulty concentrating or making decisions? Tempie Donning  Walking or climbing stairs? N N  Dressing or bathing? N Y  Doing errands, shopping? Tempie Donning  Preparing Food and eating ? Y Y  Using the Toilet? N N  In the past six months, have you accidently leaked urine? N N  Do you have problems with loss of bowel control? N N  Managing your Medications? Y Y  Managing your Finances? Tempie Donning  Housekeeping or managing your Housekeeping? Y Y  Some recent data might be hidden    Patient Literacy    Exercise Current Exercise Habits: Home exercise routine, Type of exercise: walking, Time (Minutes): 40, Frequency (Times/Week): 7, Weekly Exercise (Minutes/Week): 280, Intensity: Mild  Diet Patient reports consuming 2 meals a day and 1 snack(s) a day Patient reports that his primary diet is: Regular Patient reports  that she does have regular access to food.   Depression Screen PHQ 2/9 Scores 11/10/2018 10/07/2018 09/23/2018 07/14/2018 06/26/2018 04/09/2018 03/13/2018  PHQ - 2 Score 0 2 0 0 0 0 0  PHQ- 9 Score - 7 - - - - -     Fall Risk Fall Risk  11/10/2018 09/23/2018 07/14/2018 06/26/2018 04/09/2018  Falls in the past year? 1 0 0 0 No  Number falls in past yr: 0 - - - -  Injury with Fall? 0 - - - -  Risk Factor Category  - - - - -  Risk for fall due to : - - - - -  Follow up - - - - -     Objective:  [name] seemed alert and oriented and he participated appropriately during our telephone visit.  Blood Pressure Weight BMI  BP Readings from Last 3 Encounters:  11/10/18 135/82  09/23/18 133/90  07/14/18 135/82   Wt Readings from Last 3 Encounters:  11/10/18 203 lb (92.1 kg)  09/23/18 203 lb (92.1 kg)  07/14/18 204 lb 12.8 oz (92.9 kg)   BMI Readings from Last 1 Encounters:  11/10/18 27.53 kg/m    *Unable to obtain current vital signs, weight, and BMI due to telephone visit type  Hearing/Vision  . Pedro did not seem to have difficulty with hearing/understanding during the telephone conversation . Reports that he has not had a formal eye exam by an eye care professional within the past year - wife states that she will call and schedule this today. . Reports that he has not had a formal hearing evaluation within the past year *Unable to fully assess hearing and vision during telephone visit type  Cognitive Function: No flowsheet data found.  Normal Cognitive Function Screening: No: pt has been diagnosed with dementia and was unable to perform memory screening today. He does have a appointment later this month to discuss dementia and I will ask that the nurse evaluate him that day.  (Normal:0-7, Significant for Dysfunction: >8)  Immunization &  Health Maintenance Record Immunization History  Administered Date(s) Administered  . Influenza Split 03/26/2012  . Influenza, High Dose Seasonal PF  03/28/2016, 04/08/2017, 04/09/2018  . Influenza,inj,Quad PF,6+ Mos 04/21/2014  . Influenza-Unspecified 05/10/2015  . Pneumococcal Conjugate-13 08/18/2015  . Pneumococcal Polysaccharide-23 06/08/2014  . Td 07/04/2016    Health Maintenance  Topic Date Due  . OPHTHALMOLOGY EXAM  02/02/2017  . FOOT EXAM  01/04/2019  . HEMOGLOBIN A1C  01/12/2019  . INFLUENZA VACCINE  02/07/2019  . TETANUS/TDAP  07/04/2026  . PNA vac Low Risk Adult  Completed       Assessment  This is a routine wellness examination for [name].  Health Maintenance: Due or Overdue Health Maintenance Due  Topic Date Due  . OPHTHALMOLOGY EXAM  02/02/2017    [name] does not need a referral for Community Assistance: Care Management:   yes Social Work:    yes Prescription Assistance:  no Nutrition/Diabetes Education:  no   Plan:  Personalized Goals Goals Addressed   None    Personalized Health Maintenance & Screening Recommendations  eye appointment (wife will call and schedule this)  Lung Cancer Screening Recommended: no (Low Dose CT Chest recommended if Age 58-80 years, 30 pack-year currently smoking OR have quit w/in past 15 years) Hepatitis C Screening recommended: no HIV Screening recommended: no  Advanced Directives: Written information was not prepared per patient's request. The family has a verbal understanding of what to do in different scenarios.   Referrals & Orders No orders of the defined types were placed in this encounter.   Follow-up Plan . Follow-up with Baruch Gouty, [name] as planned . Schedule an eye exam   I have personally reviewed and noted the following in the patient's chart:   . Medical and social history . Use of alcohol, tobacco or illicit drugs  . Current medications and supplements . Functional ability and status . Nutritional status . Physical activity . Advanced directives . List of other physicians . Hospitalizations, surgeries, and ER visits in  previous 12 months . Vitals . Screenings to include cognitive, depression, and falls . Referrals and appointments  In addition, I have reviewed and discussed with [name] certain preventive protocols, quality metrics, and best practice recommendations. A written personalized care plan for preventive services as well as general preventive health recommendations is available and can be mailed to the patient at his request.      Zannie Cove, LPN   10/13/960

## 2018-11-14 ENCOUNTER — Other Ambulatory Visit: Payer: Self-pay | Admitting: Family Medicine

## 2018-11-14 DIAGNOSIS — F0390 Unspecified dementia without behavioral disturbance: Secondary | ICD-10-CM

## 2018-11-17 ENCOUNTER — Telehealth: Payer: Self-pay

## 2018-11-17 ENCOUNTER — Ambulatory Visit (INDEPENDENT_AMBULATORY_CARE_PROVIDER_SITE_OTHER): Payer: Medicare HMO | Admitting: Licensed Clinical Social Worker

## 2018-11-17 DIAGNOSIS — E1129 Type 2 diabetes mellitus with other diabetic kidney complication: Secondary | ICD-10-CM

## 2018-11-17 DIAGNOSIS — Z8673 Personal history of transient ischemic attack (TIA), and cerebral infarction without residual deficits: Secondary | ICD-10-CM

## 2018-11-17 DIAGNOSIS — I10 Essential (primary) hypertension: Secondary | ICD-10-CM

## 2018-11-17 DIAGNOSIS — Z794 Long term (current) use of insulin: Secondary | ICD-10-CM | POA: Diagnosis not present

## 2018-11-17 DIAGNOSIS — E1169 Type 2 diabetes mellitus with other specified complication: Secondary | ICD-10-CM | POA: Diagnosis not present

## 2018-11-17 DIAGNOSIS — F0150 Vascular dementia without behavioral disturbance: Secondary | ICD-10-CM

## 2018-11-17 DIAGNOSIS — E785 Hyperlipidemia, unspecified: Secondary | ICD-10-CM | POA: Diagnosis not present

## 2018-11-17 DIAGNOSIS — R809 Proteinuria, unspecified: Secondary | ICD-10-CM

## 2018-11-17 NOTE — Chronic Care Management (AMB) (Signed)
  Care Management Note   [name] is a 83 y.o. year old male who is a primary care patient of [name], Connye Burkitt, [name]. The CM team was consulted for assistance with chronic disease management and care coordination.   I reached out to [name] of client by phone today.   Review of patient status, including review of consultants reports, relevant laboratory and other test results, and collaboration with appropriate care team members and the patient's provider was performed as part of comprehensive patient evaluation and provision of chronic care management services.   Social Determinants of Health:occasional risk for social isolation    Chronic Care Management from 10/07/2018 in [place]  PHQ-9 Total Score  7     Goals Addressed            This Visit's Progress   . Client wants to talk with someone about anxiety or depression management (pt-stated)       Current Barriers:  [name] Kitchen Medical issues management . Anxiety issues  Clinical Social Work Clinical Goal(s):  Over the next 30 days, client/spouse will work with LCSW to address concerns related to management of anxiety symptoms of client. Interventions: . Talked with client/spouse about client use of relaxation techniques (taking walk,working in yard,being outdoors,gardening) . Talked with client/spouse about family support. . Encouraged client/spouse to talk with RN CM [name] about nursing needs of client . Talked with client/spouse about memory issues of client . Talked with client/spouse about client completion of daily ADLs for client  Patient Self Care Activities:  . Attends all scheduled provider appointments . Performs ADL's independently . Calls provider office for new concerns or questions   Plan: Client/spouse to talk with RN CM to discuss nursing needs of client LCSW to call client/spouse in next 3 weeks to discuss client management of anxiety symptoms Client to attend  scheduled client medical appointments Client to use relaxation techniques to help manage anxiety symptoms.  Initial goal documentation     [name], spouse of client, reported that client is sometimes a little stiff in his joints; but, she said that client does walk daily. She said client enjoys doing activities out of doors. She said client eats 2 meals daily and snacks in between meals. Client enjoys socializing with his son and his brother. Client needs occasional help with ADLs. [name] reported that client is sleeping adequately.  Follow Up Plan: LCSW to call client or spouse of client in next 3 weeks to discuss client management of anxiety symptoms  [name].Yordin Rhoda MSW, LCSW Licensed Clinical Social Worker [facility] Care Management [phone]

## 2018-11-17 NOTE — Patient Instructions (Signed)
Licensed Clinical Social Worker Visit Information  Goals we discussed today:  Goals Addressed            This Visit's Progress   . Client wants to talk with someone about anxiety or depression management (pt-stated)       Current Barriers:  [name] Kitchen Medical issues management . Anxiety issues  Clinical Social Work Clinical Goal(s):  Over the next 30 days, client/spouse will work with LCSW to address concerns related to management of anxiety symptoms of client. Interventions: . Talked with client/spouse about client use of relaxation techniques (taking walk,working in yard,being outdoors) . Talked with client/spouse about family support. . Encouraged client/spouse to talk with RN CM [name] about nursing needs of client . Talked with client/spouse about memory issues of client . Talked with client/spouse about client completion of daily ADLs for client  Patient Self Care Activities:  . Attends all scheduled provider appointments . Performs ADL's independently . Calls provider office for new concerns or questions   Plan: Client/spouse to talk with RN CM to discuss nursing needs of client LCSW to call client/spouse in next 3 weeks to discuss client management of anxiety symptoms Client to attend scheduled client medical appointments Client to use relaxation techniques to help manage anxiety symptoms.  Initial goal documentation     Materials Provided: No  Follow Up Plan: LCSW to call client or spouse of client in next 3 weeks to discuss client management of anxiety symptoms  The patient/spouse of client verbalized understanding of instructions provided today and declined a print copy of patient instruction materials.   [name].Stephenson Cichy MSW, LCSW Licensed Clinical Social Worker [facility] Care Management [phone]

## 2018-11-26 ENCOUNTER — Other Ambulatory Visit: Payer: Self-pay

## 2018-11-27 ENCOUNTER — Other Ambulatory Visit: Payer: Self-pay

## 2018-11-27 ENCOUNTER — Encounter: Payer: Self-pay | Admitting: Family Medicine

## 2018-11-27 ENCOUNTER — Ambulatory Visit (INDEPENDENT_AMBULATORY_CARE_PROVIDER_SITE_OTHER): Payer: Medicare HMO | Admitting: Family Medicine

## 2018-11-27 VITALS — BP 120/69 | Temp 98.9°F | Ht 72.0 in | Wt 212.0 lb

## 2018-11-27 DIAGNOSIS — E1169 Type 2 diabetes mellitus with other specified complication: Secondary | ICD-10-CM | POA: Diagnosis not present

## 2018-11-27 DIAGNOSIS — I10 Essential (primary) hypertension: Secondary | ICD-10-CM

## 2018-11-27 DIAGNOSIS — F0150 Vascular dementia without behavioral disturbance: Secondary | ICD-10-CM | POA: Diagnosis not present

## 2018-11-27 DIAGNOSIS — E785 Hyperlipidemia, unspecified: Secondary | ICD-10-CM

## 2018-11-27 DIAGNOSIS — Z794 Long term (current) use of insulin: Secondary | ICD-10-CM

## 2018-11-27 DIAGNOSIS — E782 Mixed hyperlipidemia: Secondary | ICD-10-CM | POA: Insufficient documentation

## 2018-11-27 DIAGNOSIS — E1159 Type 2 diabetes mellitus with other circulatory complications: Secondary | ICD-10-CM

## 2018-11-27 DIAGNOSIS — I152 Hypertension secondary to endocrine disorders: Secondary | ICD-10-CM

## 2018-11-27 LAB — BAYER DCA HB A1C WAIVED: HB A1C (BAYER DCA - WAIVED): 7.6 % — ABNORMAL HIGH (ref <7.0)

## 2018-11-27 MED ORDER — MEMANTINE HCL 10 MG PO TABS: 10.0000 mg | ORAL_TABLET | Freq: Two times a day (BID) | ORAL | 0 refills | Status: DC

## 2018-11-27 MED ORDER — TRESIBA FLEXTOUCH 100 UNIT/ML ~~LOC~~ SOPN: 16.0000 [IU] | PEN_INJECTOR | Freq: Every day | SUBCUTANEOUS | 3 refills | Status: DC

## 2018-11-27 MED ORDER — LOSARTAN POTASSIUM 50 MG PO TABS: 50.0000 mg | ORAL_TABLET | Freq: Every day | ORAL | 2 refills | Status: DC

## 2018-11-27 MED ORDER — DONEPEZIL HCL 5 MG PO TABS: 5.0000 mg | ORAL_TABLET | Freq: Every day | ORAL | 3 refills | Status: DC

## 2018-11-27 NOTE — Patient Instructions (Signed)
Diabetes Mellitus and Nutrition, Adult  When you have diabetes (diabetes mellitus), it is very important to have healthy eating habits because your blood sugar (glucose) levels are greatly affected by what you eat and drink. Eating healthy foods in the appropriate amounts, at about the same times every day, can help you:  · Control your blood glucose.  · Lower your risk of heart disease.  · Improve your blood pressure.  · Reach or maintain a healthy weight.  Every person with diabetes is different, and each person has different needs for a meal plan. Your health care provider may recommend that you work with a diet and nutrition specialist (dietitian) to make a meal plan that is best for you. Your meal plan may vary depending on factors such as:  · The calories you need.  · The medicines you take.  · Your weight.  · Your blood glucose, blood pressure, and cholesterol levels.  · Your activity level.  · Other health conditions you have, such as heart or kidney disease.  How do carbohydrates affect me?  Carbohydrates, also called carbs, affect your blood glucose level more than any other type of food. Eating carbs naturally raises the amount of glucose in your blood. Carb counting is a method for keeping track of how many carbs you eat. Counting carbs is important to keep your blood glucose at a healthy level, especially if you use insulin or take certain oral diabetes medicines.  It is important to know how many carbs you can safely have in each meal. This is different for every person. Your dietitian can help you calculate how many carbs you should have at each meal and for each snack.  Foods that contain carbs include:  · Bread, cereal, rice, pasta, and crackers.  · Potatoes and corn.  · Peas, beans, and lentils.  · Milk and yogurt.  · Fruit and juice.  · Desserts, such as cakes, cookies, ice cream, and candy.  How does alcohol affect me?  Alcohol can cause a sudden decrease in blood glucose (hypoglycemia),  especially if you use insulin or take certain oral diabetes medicines. Hypoglycemia can be a life-threatening condition. Symptoms of hypoglycemia (sleepiness, dizziness, and confusion) are similar to symptoms of having too much alcohol.  If your health care provider says that alcohol is safe for you, follow these guidelines:  · Limit alcohol intake to no more than 1 drink per day for nonpregnant women and 2 drinks per day for men. One drink equals 12 oz of beer, 5 oz of wine, or 1½ oz of hard liquor.  · Do not drink on an empty stomach.  · Keep yourself hydrated with water, diet soda, or unsweetened iced tea.  · Keep in mind that regular soda, juice, and other mixers may contain a lot of sugar and must be counted as carbs.  What are tips for following this plan?    Reading food labels  · Start by checking the serving size on the "Nutrition Facts" label of packaged foods and drinks. The amount of calories, carbs, fats, and other nutrients listed on the label is based on one serving of the item. Many items contain more than one serving per package.  · Check the total grams (g) of carbs in one serving. You can calculate the number of servings of carbs in one serving by dividing the total carbs by 15. For example, if a food has 30 g of total carbs, it would be equal to 2   servings of carbs.  · Check the number of grams (g) of saturated and trans fats in one serving. Choose foods that have low or no amount of these fats.  · Check the number of milligrams (mg) of salt (sodium) in one serving. Most people should limit total sodium intake to less than 2,300 mg per day.  · Always check the nutrition information of foods labeled as "low-fat" or "nonfat". These foods may be higher in added sugar or refined carbs and should be avoided.  · Talk to your dietitian to identify your daily goals for nutrients listed on the label.  Shopping  · Avoid buying canned, premade, or processed foods. These foods tend to be high in fat, sodium,  and added sugar.  · Shop around the outside edge of the grocery store. This includes fresh fruits and vegetables, bulk grains, fresh meats, and fresh dairy.  Cooking  · Use low-heat cooking methods, such as baking, instead of high-heat cooking methods like deep frying.  · Cook using healthy oils, such as olive, canola, or sunflower oil.  · Avoid cooking with butter, cream, or high-fat meats.  Meal planning  · Eat meals and snacks regularly, preferably at the same times every day. Avoid going long periods of time without eating.  · Eat foods high in fiber, such as fresh fruits, vegetables, beans, and whole grains. Talk to your dietitian about how many servings of carbs you can eat at each meal.  · Eat 4-6 ounces (oz) of lean protein each day, such as lean meat, chicken, fish, eggs, or tofu. One oz of lean protein is equal to:  ? 1 oz of meat, chicken, or fish.  ? 1 egg.  ? ¼ cup of tofu.  · Eat some foods each day that contain healthy fats, such as avocado, nuts, seeds, and fish.  Lifestyle  · Check your blood glucose regularly.  · Exercise regularly as told by your health care provider. This may include:  ? 150 minutes of moderate-intensity or vigorous-intensity exercise each week. This could be brisk walking, biking, or water aerobics.  ? Stretching and doing strength exercises, such as yoga or weightlifting, at least 2 times a week.  · Take medicines as told by your health care provider.  · Do not use any products that contain nicotine or tobacco, such as cigarettes and e-cigarettes. If you need help quitting, ask your health care provider.  · Work with a counselor or diabetes educator to identify strategies to manage stress and any emotional and social challenges.  Questions to ask a health care provider  · Do I need to meet with a diabetes educator?  · Do I need to meet with a dietitian?  · What number can I call if I have questions?  · When are the best times to check my blood glucose?  Where to find more  information:  · American Diabetes Association: diabetes.org  · Academy of Nutrition and Dietetics: www.eatright.org  · National Institute of Diabetes and Digestive and Kidney Diseases (NIH): www.niddk.nih.gov  Summary  · A healthy meal plan will help you control your blood glucose and maintain a healthy lifestyle.  · Working with a diet and nutrition specialist (dietitian) can help you make a meal plan that is best for you.  · Keep in mind that carbohydrates (carbs) and alcohol have immediate effects on your blood glucose levels. It is important to count carbs and to use alcohol carefully.  This information is not intended to   replace advice given to you by your health care provider. Make sure you discuss any questions you have with your health care provider.  Document Released: 03/22/2005 Document Revised: 01/23/2017 Document Reviewed: 07/30/2016  Elsevier Interactive Patient Education © 2019 Elsevier Inc.

## 2018-11-27 NOTE — Progress Notes (Addendum)
Subjective:  Patient ID: [name], male    DOB: 04-20-1936, 83 y.o.   MRN: [id number]  Chief Complaint:  Medical Management of Chronic Issues and Dementia   HPI: [name] is a 83 y.o. male presenting on 11/27/2018 for Medical Management of Chronic Issues and Dementia   1. Hypertension associated with type 2 diabetes mellitus ([place])  Complaint with meds - Yes Checking BP at home - No Exercising Regularly - Yes Watching Salt intake - Yes Pertinent ROS:  Headache - No Chest pain - No Dyspnea - No Palpitations - No LE edema - No They report good compliance with medications and can restate their regimen by memory. No medication side effects.  BP Readings from Last 3 Encounters:  11/27/18 120/69  11/10/18 135/82  09/23/18 133/90     2. Hyperlipidemia associated with type 2 diabetes mellitus ([place])  Does try to watch diet, but does like to eat candy daily. Does walk everyday.    3. Type 2 diabetes mellitus with other specified complication, with long-term current use of insulin (HCC)  Diabetes mellitus 2 Compliant with meds - Yes Checking CBGs? Yes  Fasting avg - 110  Postprandial average - 180 Exercising regularly? - Yes Watching carbohydrate intake? - No Neuropathy ? - No Hypoglycemic events - No Pertinent ROS:  Polyuria - No Polydipsia - No Vision problems - No   4. Vascular dementia without behavioral disturbance [facility])  Wife reports pt is doing much better since the initiation of [name]. States he is now sleeping at night and not wandering. States he is still forgetful and has memory problems, states that has not increased or decreased.      Relevant past medical, surgical, family, and social history reviewed and updated as indicated.  Allergies and medications reviewed and updated.   Past Medical History:  Diagnosis Date  . Anxiety   . Asthma   . Choledocholithiasis 02/24/2013  . COPD (chronic obstructive pulmonary disease) ([place])   . CVA  (cerebral vascular accident) ([place]) 2013   loss of memory  . Dementia ([place])   . Diabetes mellitus   . Hx-TIA (transient ischemic attack)   . Hyperlipidemia   . Hypertension     Past Surgical History:  Procedure Laterality Date  . CATARACT EXTRACTION W/PHACO Left 04/30/2016   Procedure: CATARACT EXTRACTION PHACO AND INTRAOCULAR LENS PLACEMENT LEFT EYE CDE=9.83;  Surgeon: Tonny Branch, MD;  Location: [facility];  Service: Ophthalmology;  Laterality: Left;  left  . CHOLECYSTECTOMY N/A 02/23/2013   Procedure: LAPAROSCOPIC CHOLECYSTECTOMY;  Surgeon: Jamesetta So, MD;  Location: [facility];  Service: General;  Laterality: N/A;  . ERCP N/A 02/24/2013   Procedure: ENDOSCOPIC RETROGRADE CHOLANGIOPANCREATOGRAPHY;  Surgeon: Daneil Dolin, MD;  Location: [facility];  Service: Endoscopy;  Laterality: N/A;  . ROTATOR CUFF REPAIR Right     Social History   Socioeconomic History  . Marital status: Married    Spouse name: [name]  . Number of children: 6  . Years of education: 11th  . Highest education level: Not on file  Occupational History  . Occupation: Retired    Comment: security - Marine scientist   Social Needs  . Financial resource strain: Not very hard  . Food insecurity:    Worry: Never true    Inability: Never true  . Transportation needs:    Medical: No    Non-medical: No  Tobacco Use  . Smoking status: Never Smoker  . Smokeless tobacco: Never  Used  Substance and Sexual Activity  . Alcohol use: No  . Drug use: No  . Sexual activity: Not Currently  Lifestyle  . Physical activity:    Days per week: 0 days    Minutes per session: 0 min  . Stress: Only a little  Relationships  . Social connections:    Talks on phone: More than three times a week    Gets together: More than three times a week    Attends religious service: 1 to 4 times per year    Active member of club or organization: No    Attends meetings of clubs or organizations: Never    Relationship status: Married  . Intimate  partner violence:    Fear of current or ex partner: No    Emotionally abused: No    Physically abused: No    Forced sexual activity: No  Other Topics Concern  . Not on file  Social History Narrative   Patient lives at home with wife, [name] = oldest daughter lives there also    Caffeine Use: 1 cup of coffee and 2 can pepsi    Outpatient Encounter Medications as of 11/27/2018  Medication Sig  . ACCU-CHEK SOFTCLIX LANCETS lancets Check glucose twice a day  . albuterol (PROVENTIL HFA;VENTOLIN HFA) 108 (90 Base) MCG/ACT inhaler Inhale 2 puffs into the lungs every 4 (four) hours as needed for wheezing or shortness of breath.  [name] Kitchen albuterol (PROVENTIL) (2.5 MG/3ML) 0.083% nebulizer solution Take 3 mLs (2.5 mg total) by nebulization every 6 (six) hours as needed for wheezing or shortness of breath.  [name] Kitchen aspirin EC 81 MG tablet Take 162 mg by mouth daily.   [name] Kitchen atorvastatin (LIPITOR) 40 MG tablet Take 1 tablet (40 mg total) by mouth daily.  . Blood Glucose Calibration (ACCU-CHEK AVIVA) SOLN Use with glucometer  . blood glucose meter kit and supplies KIT Daily. Onetouch Verio  . Blood Glucose Monitoring Suppl (ACCU-CHEK AVIVA PLUS) w/Device KIT Check glucose twice a day  . fluticasone furoate-vilanterol (BREO ELLIPTA) 100-25 MCG/INH AEPB Inhale 1 puff into the lungs daily.  [name] Kitchen glucose blood (ACCU-CHEK AVIVA PLUS) test strip Check glucose twice a day  . Insulin Pen Needle (PEN NEEDLES) 32G X 4 MM MISC 1 each by Does not apply route 2 (two) times daily.  [name] Kitchen losartan (COZAAR) 50 MG tablet Take 1 tablet (50 mg total) by mouth daily.  . memantine (NAMENDA) 10 MG tablet Take 1 tablet (10 mg total) by mouth 2 (two) times daily.  . Nebulizers (COMP AIR COMPRESSOR NEBULIZER) MISC   . TRESIBA FLEXTOUCH 100 UNIT/ML SOPN FlexTouch Pen Inject 0.16 mLs (16 Units total) into the skin daily at 10 pm.  . [DISCONTINUED] losartan (COZAAR) 50 MG tablet Take 1 tablet (50 mg total) by mouth daily.  . [DISCONTINUED] memantine  (NAMENDA) 10 MG tablet TAKE 1 TABLET TWICE DAILY  . [DISCONTINUED] TRESIBA FLEXTOUCH 100 UNIT/ML SOPN FlexTouch Pen Inject 0.16 mLs (16 Units total) into the skin daily at 10 pm.  . donepezil ([name]) 5 MG tablet Take 1 tablet (5 mg total) by mouth at bedtime for 30 days.  . [DISCONTINUED] donepezil ([name]) 5 MG tablet Take 1 tablet (5 mg total) by mouth at bedtime for 30 days.   No facility-administered encounter medications on file as of 11/27/2018.     Allergies  Allergen Reactions  . Ace Inhibitors Swelling    Review of Systems  Constitutional: Negative for activity change, appetite change, chills, diaphoresis, fatigue, fever and  unexpected weight change.  Eyes: Negative for photophobia and visual disturbance.  Respiratory: Negative for cough, chest tightness and shortness of breath.   Cardiovascular: Negative for chest pain, palpitations and leg swelling.  Gastrointestinal: Negative for abdominal distention, abdominal pain, constipation, diarrhea, nausea and vomiting.  Endocrine: Negative for polydipsia, polyphagia and polyuria.  Genitourinary: Negative for decreased urine volume and difficulty urinating.  Musculoskeletal: Negative for arthralgias and myalgias.  Neurological: Negative for dizziness, tremors, seizures, syncope, facial asymmetry, speech difficulty, weakness, light-headedness, numbness and headaches.  Psychiatric/Behavioral: Positive for agitation and confusion (baseline). Negative for sleep disturbance.  All other systems reviewed and are negative.       Objective:  BP 120/69   Temp 98.9 F (37.2 C) (Oral)   Ht 6' (1.829 m)   Wt 212 lb (96.2 kg)   BMI 28.75 kg/m    Wt Readings from Last 3 Encounters:  11/27/18 212 lb (96.2 kg)  11/10/18 203 lb (92.1 kg)  09/23/18 203 lb (92.1 kg)    Physical Exam Vitals signs and nursing note reviewed. Exam conducted with a chaperone present.  Constitutional:      General: He is not in acute distress.     Appearance: Normal appearance. He is well-developed and well-groomed. He is not ill-appearing or toxic-appearing.  HENT:     Head: Normocephalic and atraumatic.     Right Ear: Hearing, tympanic membrane, ear canal and external ear normal.     Left Ear: Hearing, tympanic membrane, ear canal and external ear normal.     Nose: Nose normal.     Mouth/Throat:     Lips: Pink.     Mouth: Mucous membranes are moist.     Pharynx: Oropharynx is clear. Uvula midline.  Eyes:     General: Lids are normal.     Extraocular Movements: Extraocular movements intact.     Conjunctiva/sclera: Conjunctivae normal.     Pupils: Pupils are equal, round, and reactive to light.  Neck:     Musculoskeletal: Full passive range of motion without pain and neck supple.     Thyroid: No thyroid mass, thyromegaly or thyroid tenderness.     Vascular: No carotid bruit or JVD.     Trachea: Trachea and phonation normal.  Cardiovascular:     Rate and Rhythm: Normal rate and regular rhythm.     Pulses: Normal pulses.     Heart sounds: Normal heart sounds. No murmur. No friction rub. No gallop.   Pulmonary:     Effort: Pulmonary effort is normal. No respiratory distress.     Breath sounds: Normal breath sounds.  Abdominal:     General: There is no abdominal bruit.     Palpations: Abdomen is soft.     Tenderness: There is no abdominal tenderness.  Genitourinary:    Pubic Area: No rash or pubic lice.      Penis: Normal.      Scrotum/Testes: Normal.     Epididymis:     Right: Normal.     Left: Normal.  Musculoskeletal: Normal range of motion.     Right lower leg: No edema.     Left lower leg: No edema.  Skin:    General: Skin is warm and dry.     Capillary Refill: Capillary refill takes less than 2 seconds.  Neurological:     General: No focal deficit present.     Mental Status: He is alert. Mental status is at baseline.     Cranial Nerves: Cranial nerves are  intact.     Sensory: Sensation is intact.     Motor:  Motor function is intact.     Coordination: Coordination is intact.     Gait: Gait is intact.     Deep Tendon Reflexes: Reflexes are normal and symmetric.  Psychiatric:        Attention and Perception: Attention and perception normal.        Mood and Affect: Mood and affect normal.        Speech: Speech normal.        Behavior: Behavior normal. Behavior is cooperative.        Thought Content: Thought content normal.        Cognition and Memory: Memory is impaired.     Results for orders placed or performed in visit on 07/14/18  Bayer DCA Hb A1c Waived  Result Value Ref Range   HB A1C (BAYER DCA - WAIVED) 7.7 (H) <7.0 %       Pertinent labs & imaging results that were available during my care of the patient were reviewed by me and considered in my medical decision making.  Assessment & Plan:  Shaquile was seen today for dementia.  Diagnoses and all orders for this visit:  Hypertension associated with type 2 diabetes mellitus ([place]) Well controlled on current regimen. Continue below. Labs pending. Diet and exercise encouraged.  -     CMP14+EGFR -     CBC with Differential/Platelet -     TSH -     Microalbumin / creatinine urine ratio -     losartan (COZAAR) 50 MG tablet; Take 1 tablet (50 mg total) by mouth daily.  Hyperlipidemia associated with type 2 diabetes mellitus ([place]) Diet and exercise encouraged. Labs pending. -     Lipid panel  Type 2 diabetes mellitus with other specified complication, with long-term current use of insulin (HCC) A1C 7.6 in office today. Pt aware to cut back on sweets and limit carbohydrate intake. Will recheck A1C in 3 months and adjust medications if warranted.  -     CMP14+EGFR -     TSH -     Microalbumin / creatinine urine ratio -     Bayer DCA Hb A1c Waived -     TRESIBA FLEXTOUCH 100 UNIT/ML SOPN FlexTouch Pen; Inject 0.16 mLs (16 Units total) into the skin daily at 10 pm.  Vascular dementia without behavioral disturbance (HCC) Doing very  well with addition of [name]. Will continue current regimen. Report any new or worsening symptoms.  -     donepezil ([name]) 5 MG tablet; Take 1 tablet (5 mg total) by mouth at bedtime for 30 days. -     memantine (NAMENDA) 10 MG tablet; Take 1 tablet (10 mg total) by mouth 2 (two) times daily.     Continue all other maintenance medications.  Follow up plan: Return in about 3 months (around 02/27/2019), or if symptoms worsen or fail to improve, for DM.  Educational handout given for diabetes  The above assessment and management plan was discussed with the patient. The patient verbalized understanding of and has agreed to the management plan. Patient is aware to call the clinic if symptoms persist or worsen. Patient is aware when to return to the clinic for a follow-up visit. Patient educated on when it is appropriate to go to the emergency department.   Monia Pouch, [name]-C [facility] [phone]

## 2018-11-28 LAB — CMP14+EGFR
ALT: 22 IU/L (ref 0–44)
AST: 25 IU/L (ref 0–40)
Albumin/Globulin Ratio: 1.7 (ref 1.2–2.2)
Albumin: 4.3 g/dL (ref 3.7–4.7)
Alkaline Phosphatase: 111 IU/L (ref 39–117)
BUN/Creatinine Ratio: 9 — ABNORMAL LOW (ref 10–24)
BUN: 11 mg/dL (ref 8–27)
Bilirubin Total: 0.5 mg/dL (ref 0.0–1.2)
CO2: 20 mmol/L (ref 20–29)
Calcium: 8.5 mg/dL — ABNORMAL LOW (ref 8.6–10.2)
Chloride: 106 mmol/L (ref 96–106)
Creatinine, Ser: 1.24 mg/dL (ref 0.76–1.27)
GFR calc Af Amer: 63 mL/min/{1.73_m2} (ref >59)
GFR calc non Af Amer: 55 mL/min/{1.73_m2} — ABNORMAL LOW (ref >59)
Globulin, Total: 2.6 g/dL (ref 1.5–4.5)
Glucose: 159 mg/dL — ABNORMAL HIGH (ref 65–99)
Potassium: 3.8 mmol/L (ref 3.5–5.2)
Sodium: 141 mmol/L (ref 134–144)
Total Protein: 6.9 g/dL (ref 6.0–8.5)

## 2018-11-28 LAB — LIPID PANEL
Chol/HDL Ratio: 2.1 ratio (ref 0.0–5.0)
Cholesterol, Total: 85 mg/dL — ABNORMAL LOW (ref 100–199)
HDL: 41 mg/dL (ref >39)
LDL Calculated: 18 mg/dL (ref 0–99)
Triglycerides: 130 mg/dL (ref 0–149)
VLDL Cholesterol Cal: 26 mg/dL (ref 5–40)

## 2018-11-28 LAB — CBC WITH DIFFERENTIAL/PLATELET
Basophils Absolute: 0.1 10*3/uL (ref 0.0–0.2)
Basos: 1 %
EOS (ABSOLUTE): 0.2 10*3/uL (ref 0.0–0.4)
Eos: 3 %
Hematocrit: 37.2 % — ABNORMAL LOW (ref 37.5–51.0)
Hemoglobin: 13.2 g/dL (ref 13.0–17.7)
Immature Grans (Abs): 0 10*3/uL (ref 0.0–0.1)
Immature Granulocytes: 1 %
Lymphocytes Absolute: 1.8 10*3/uL (ref 0.7–3.1)
Lymphs: 27 %
MCH: 30.8 pg (ref 26.6–33.0)
MCHC: 35.5 g/dL (ref 31.5–35.7)
MCV: 87 fL (ref 79–97)
Monocytes Absolute: 0.4 10*3/uL (ref 0.1–0.9)
Monocytes: 6 %
Neutrophils Absolute: 4.1 10*3/uL (ref 1.4–7.0)
Neutrophils: 62 %
Platelets: 182 10*3/uL (ref 150–450)
RBC: 4.28 x10E6/uL (ref 4.14–5.80)
RDW: 11.9 % (ref 11.6–15.4)
WBC: 6.6 10*3/uL (ref 3.4–10.8)

## 2018-11-28 LAB — TSH: TSH: 2.53 u[IU]/mL (ref 0.450–4.500)

## 2018-12-08 ENCOUNTER — Ambulatory Visit: Payer: Self-pay | Admitting: Licensed Clinical Social Worker

## 2018-12-08 DIAGNOSIS — E1159 Type 2 diabetes mellitus with other circulatory complications: Secondary | ICD-10-CM

## 2018-12-08 DIAGNOSIS — F0150 Vascular dementia without behavioral disturbance: Secondary | ICD-10-CM

## 2018-12-08 DIAGNOSIS — E1169 Type 2 diabetes mellitus with other specified complication: Secondary | ICD-10-CM

## 2018-12-08 DIAGNOSIS — I10 Essential (primary) hypertension: Secondary | ICD-10-CM

## 2018-12-08 DIAGNOSIS — H35361 Drusen (degenerative) of macula, right eye: Secondary | ICD-10-CM | POA: Diagnosis not present

## 2018-12-08 DIAGNOSIS — H2511 Age-related nuclear cataract, right eye: Secondary | ICD-10-CM | POA: Diagnosis not present

## 2018-12-08 DIAGNOSIS — Z8673 Personal history of transient ischemic attack (TIA), and cerebral infarction without residual deficits: Secondary | ICD-10-CM

## 2018-12-08 DIAGNOSIS — E113293 Type 2 diabetes mellitus with mild nonproliferative diabetic retinopathy without macular edema, bilateral: Secondary | ICD-10-CM | POA: Diagnosis not present

## 2018-12-08 DIAGNOSIS — I152 Hypertension secondary to endocrine disorders: Secondary | ICD-10-CM

## 2018-12-08 NOTE — Patient Instructions (Addendum)
Licensed Clinical Social Worker Visit Information  Goals we discussed today:  Goals Addressed            This Visit's Progress   . Client wants to talk with someone about anxiety or depression management (pt-stated)       Current Barriers:  [name] Medical issues management . Anxiety issues  Clinical Social Work Clinical Goal(s):  Over the next 30 days, client/spouse will work with LCSW to address concerns related to management of anxiety symptoms of client. Interventions: . Previously talked with client/spouse about client use of relaxation techniques (taking walk,working in yard,being outdoors) . Previously talked with client/spouse about family support. . Previously encouraged client/spouse to talk with RN CM [name] about nursing needs of client . Talked with client/spouse about client completion of daily ADLs for client  Patient Self Care Activities:  . Attends all scheduled provider appointments . Performs ADL's independently . Calls provider office for new concerns or questions   Plan: Client/spouse to talk with RN CM to discuss nursing needs of client LCSW to call client/spouse in next 3 weeks to discuss client management of anxiety symptoms Client to attend scheduled client medical appointments Client to use relaxation techniques to help manage anxiety symptoms.  Initial goal documentation      Materials Provided: No    [name], spouse of client, previously reported that client is sometimes a little stiff in his joints; but, she said that client does walk daily. She said client enjoys doing activities out of doors. She said client eats 2 meals daily and snacks in between meals. Client enjoys socializing with his son and his brother. Client needs occasional help with ADLs. [name] reported that client is sleeping adequately. Client has prescribed medications and is taking medications as prescribed. [name] is very supportive of client. LCSW has encouraged  client or [name] to communicate as needed with RNCM to discuss nursing needs of client.[name] reported recently to [name]. [name], [name] that client was doing much better since the initiation of [name] . [name] had reported that client is not wandering and is sleeping better at night; although, [name] reported client still has memory issues.   Follow Up Plan: LCSW to call client or spouse in next 3 weeks to discuss client management of anxiety symptoms  The patient/spouse of patient verbalized understanding of instructions provided today and declined a print copy of patient instruction materials.   [name].[name] MSW, LCSW Licensed Clinical Social Worker [facility] [phone]

## 2018-12-08 NOTE — Chronic Care Management (AMB) (Signed)
  Care Management Note   [name] is a 83 y.o. year old male who is a primary care patient of [name], Connye Burkitt, [name]. The CM team was consulted for assistance with chronic disease management and care coordination.   I reached out to [name] by phone today.   Review of patient status, including review of consultants repo/charts, relevant laboratory and other test results, and collaboration with appropriate care team members and the patient's provider was performed as part of comprehensive patient evaluation and provision of chronic care management services.   Social Determinants of Health:Client has memory difficulties/challenges.    Chronic Care Management from 10/07/2018 in [facility]  PHQ-9 Total Score  7      Goals Addressed            This Visit's Progress   . Client wants to talk with someone about anxiety or depression management (pt-stated)       Current Barriers:  [name] Kitchen Medical issues management . Anxiety issues  Clinical Social Work Clinical Goal(s):  Over the next 30 days, client/spouse will work with [name] to address concerns related to management of anxiety symptoms of client. Interventions: . Previously talked with client/spouse about client use of relaxation techniques (taking walk,working in yard,being outdoors) . Previously talked with client/spouse about family support. . Previously encouraged client/spouse to talk with RN CM [name] about nursing needs of client . Talked with client/spouse about client completion of daily ADLs for client  Patient Self Care Activities:  . Attends all scheduled provider appointments . Performs ADL's independently . Calls provider office for new concerns or questions   Plan: Client/spouse to talk with RN CM to discuss nursing needs of client [name] to call client/spouse in next 3 weeks to discuss client management of anxiety symptoms Client to attend scheduled client medical appointments Client  to use relaxation techniques to help manage anxiety symptoms.  Initial goal documentation        [name], spouse of client, previously reported that client is sometimes a little stiff in his joints; but, she said that client does walk daily. She said client enjoys doing activities out of doors. She said client eats 2 meals daily and snacks in between meals. Client enjoys socializing with his son and his brother. Client needs occasional help with ADLs. [name] reported that client is sleeping adequately. Client has prescribed medications and is taking medications as prescribed. [name] is very supportive of client. [name] has encouraged client or [name] to communicate as needed with RNCM to discuss nursing needs of client.[name] reported recently to [facility]. [name], [name] that client was doing much better since the initiation of [name] . [name] had reported that client is not wandering and is sleeping better at night; although, [name] reported client still has memory issues.   Follow Up Plan: [name] to call client or spouse in next 3 weeks to discuss client management of anxiety symptoms  [name].[name] MSW, [name] Licensed Clinical Social Worker [facility] Care [facility] [phone]

## 2018-12-26 ENCOUNTER — Other Ambulatory Visit: Payer: Self-pay

## 2018-12-26 ENCOUNTER — Ambulatory Visit (INDEPENDENT_AMBULATORY_CARE_PROVIDER_SITE_OTHER): Payer: Medicare HMO | Admitting: *Deleted

## 2018-12-26 VITALS — Ht 72.0 in | Wt 212.0 lb

## 2018-12-26 DIAGNOSIS — Z0000 Encounter for general adult medical examination without abnormal findings: Secondary | ICD-10-CM | POA: Diagnosis not present

## 2018-12-26 NOTE — Patient Instructions (Signed)
[name] , Thank you for taking time to come for your Medicare Wellness Visit. I appreciate your ongoing commitment to your health goals. Please review the following plan we discussed and let me know if I can assist you in the future.   These are the goals we discussed: Goals    . "We want to keep Kenneth blood sugar under control" (pt-stated)     Current Barriers:  . Cognitive Deficits  . Non adherence to recommendations  Nurse Case Manager Clinical Goal(s):  [name] Kitchen Over the next 30 days, patient will verbalize understanding of plan for diabetes management. . Over the next 30 days, patient will work with PCP and RN Case Manager to address needs related to diabetes.  . Over the next 90 days, patient will hgb A1C decreasing with of a goal of < 7.  Interventions:  . Evaluation of current treatment plan related to diabetes management and patient's adherence to plan as established by provider. . Advised patient to increase water intake, decrease soda intake, and cut back on daily chocolate consumption. o Wife reports that he drinks 2 canned Pepsi daily and that he loves chocolate bars and eats at least one a day.   . Reviewed medications with patient and discussed [place]. . Discussed plans with patient for ongoing care management follow up and provided patient with direct contact information for care management team . Advised patient, providing education and rationale, to check cbg daily and record, calling [phone] for findings outside established parameters.   o Wife reports readings are generally below 180  Patient Self Care Activities:  . Performs ADL's independently  . Has assistance from wife  Please see past updates related to this goal by clicking on the "Past Updates" button in the selected goal       . "We want to manage Kenneth dementia and keep it from getting worse" (pt-stated)     Current Barriers:  [name] Kitchen Knowledge Deficits related to dementia disease process.  Nurse Case Manager  Clinical Goal(s):  [name] Kitchen Over the next 30 days, patient will verbalize understanding of plan for dementia  Over the next 60 days, patient and wife will work with PCP regarding the effectiveness of Namenda and [name].  Interventions:  . Evaluation of current treatment plan related to dementia and patient's adherence to plan as established by provider. . Advised patient to continue to stay physically and socially active.  o Physical activity and socialization are very important and can help decrease anxiety and depression and can have a positive impact on cognitive function.  o Wife reports that he walks daily to visit [name] and Kenneth brother who both live close by.  o Patient enjoys riding in the car but is not able to drive at this point. . Encouraged a healthy diet with lean proteins, low glycemic fruits, vegetables, and whole grains.  o Wife reports that he is not eating foods that he normally liked. He wants to eat out somewhere like [employer] everyday.  . Provided education to patient re: dementia vs [place]'s  Patient Self Care Activities:  . Currently UNABLE TO independently drive . Performs ADL's independently  . Has help from wife  Please see past updates related to this goal by clicking on the "Past Updates" button in the selected goal       . Client wants to talk with someone about anxiety or depression management (pt-stated)     Current Barriers:  [name] Kitchen Medical issues management . Anxiety issues  Clinical  Social Work Clinical Goal(s):  Over the next 30 days, client/spouse will work with LCSW to address concerns related to management of anxiety symptoms of client. Interventions: . Talked with client/spouse about client use of relaxation techniques (taking walk,working in yard,being outdoors) . Talked with client/spouse about family support. . Encouraged client/spouse to talk with RN CM [name] about nursing needs of client . Talked with client/spouse about client completion  of daily ADLs for client  Patient Self Care Activities:  . Attends all scheduled provider appointments . Performs ADL's independently . Calls provider office for new concerns or questions   Plan: Client/spouse to talk with RN CM to discuss nursing needs of client LCSW to call client/spouse in next 3 weeks to discuss client management of anxiety symptoms Client to attend scheduled client medical appointments Client to use relaxation techniques to help manage anxiety symptoms.  Initial goal documentation    . DIET - EAT MORE VEGETABLES     Increase vegetables to 2-3 servings per day.  Reduce chocolate intake to 1 hershey bar per week.        This is a list of the screening recommended for you and due dates:  Health Maintenance  Topic Date Due  . Eye exam for diabetics  02/02/2017  . Complete foot exam   01/04/2019  . Flu Shot  02/07/2019  . Hemoglobin A1C  05/30/2019  . Tetanus Vaccine  07/04/2026  . Pneumonia vaccines  Completed    Advance Directive  Advance directives are legal documents that let you make choices ahead of time about your health care and medical treatment in case you become unable to communicate for yourself. Advance directives are a way for you to communicate your wishes to family, friends, and health care providers. This can help convey your decisions about end-of-life care if you become unable to communicate. Discussing and writing advance directives should happen over time rather than all at once. Advance directives can be changed depending on your situation and what you want, even after you have signed the advance directives. If you do not have an advance directive, some states assign family decision makers to act on your behalf based on how closely you are related to them. Each state has its own laws regarding advance directives. You may want to check with your health care provider, attorney, or state representative about the laws in your state. There are  different types of advance directives, such as:  Medical power of attorney.  Living will.  Do not resuscitate (DNR) or do not attempt resuscitation (DNAR) order. Health care proxy and medical power of attorney A health care proxy, also called a health care agent, is a person who is appointed to make medical decisions for you in cases in which you are unable to make the decisions yourself. Generally, people choose someone they know well and trust to represent their preferences. Make sure to ask this person for an agreement to act as your proxy. A proxy may have to exercise judgment in the event of a medical decision for which your wishes are not known. A medical power of attorney is a legal document that names your health care proxy. Depending on the laws in your state, after the document is written, it may also need to be:  Signed.  Notarized.  Dated.  Copied.  Witnessed.  Incorporated into your medical record. You may also want to appoint someone to manage your financial affairs in a situation in which you are unable to  do so. This is called a durable power of attorney for finances. It is a separate legal document from the durable power of attorney for health care. You may choose the same person or someone different from your health care proxy to act as your agent in financial matters. If you do not appoint a proxy, or if there is a concern that the proxy is not acting in your best interests, a court-appointed guardian may be designated to act on your behalf. Living will A living will is a set of instructions documenting your wishes about medical care when you cannot express them yourself. Health care providers should keep a copy of your living will in your medical record. You may want to give a copy to family members or friends. To alert caregivers in case of an emergency, you can place a card in your wallet to let them know that you have a living will and where they can find it. A living  will is used if you become:  Terminally ill.  Incapacitated.  Unable to communicate or make decisions. Items to consider in your living will include:  The use or non-use of life-sustaining equipment, such as dialysis machines and breathing machines (ventilators).  A DNR or DNAR order, which is the instruction not to use cardiopulmonary resuscitation (CPR) if breathing or heartbeat stops.  The use or non-use of tube feeding.  Withholding of food and fluids.  Comfort (palliative) care when the goal becomes comfort rather than a cure.  Organ and tissue donation. A living will does not give instructions for distributing your money and property if you should pass away. It is recommended that you seek the advice of a lawyer when writing a will. Decisions about taxes, beneficiaries, and asset distribution will be legally binding. This process can relieve your family and friends of any concerns surrounding disputes or questions that may come up about the distribution of your assets. DNR or DNAR A DNR or DNAR order is a request not to have CPR in the event that your heart stops beating or you stop breathing. If a DNR or DNAR order has not been made and shared, a health care provider will try to help any patient whose heart has stopped or who has stopped breathing. If you plan to have surgery, talk with your health care provider about how your DNR or DNAR order will be followed if problems occur. Summary  Advance directives are the legal documents that allow you to make choices ahead of time about your health care and medical treatment in case you become unable to communicate for yourself.  The process of discussing and writing advance directives should happen over time. You can change the advance directives, even after you have signed them.  Advance directives include DNR or DNAR orders, living wills, and designating an agent as your medical power of attorney. This information is not intended to  replace advice given to you by your health care provider. Make sure you discuss any questions you have with your health care provider. Document Released: 10/02/2007 Document Revised: 05/14/2016 Document Reviewed: 05/14/2016 Elsevier Interactive Patient Education  2019 [employer].

## 2018-12-26 NOTE — Progress Notes (Signed)
MEDICARE ANNUAL WELLNESS VISIT  12/26/2018  Telephone Visit Disclaimer This Medicare AWV was conducted by telephone due to national recommendations for restrictions regarding the COVID-19 Pandemic (e.g. social distancing).  I verified, using two identifiers, that I am speaking with [name] or their authorized healthcare agent. I discussed the limitations, risks, security, and privacy concerns of performing an evaluation and management service by telephone and the potential availability of an in-person appointment in the future. The patient expressed understanding and agreed to proceed.   Subjective:  [name] is a 83 y.o. male patient of [name], Connye Burkitt, [name] who had a Medicare Annual Wellness Visit today via telephone. [name] is Retired and lives with their spouse. he has 6 children. he reports that he is socially active and does interact with friends/family regularly. he is minimally physically active and enjoys fishing.  Patient Care Team: Baruch Gouty, [name] as PCP - General (Family Medicine) Shea Evans, [name], LCSW as Social Worker (Licensed Clinical Social Worker) Cori Razor, Delice Bison, RN as Case Manager Collene Gobble, MD as Consulting Physician (Pulmonary Disease)  Advanced Directives 12/26/2018 11/10/2018 10/10/2017 10/09/2017 04/27/2016 02/12/2016 10/24/2015  Does Patient Have a Medical Advance Directive? No No - No No No No  Type of Advance Directive - - - - - - -  Would patient like information on creating a medical advance directive? Yes (MAU/Ambulatory/Procedural Areas - Information given) No - Patient declined Yes (MAU/Ambulatory/Procedural Areas - Information given) - No - patient declined information - No - patient declined information  Pre-existing out of facility DNR order (yellow form or pink MOST form) - - - - - - -    Hospital Utilization Over the Past 12 Months: # of hospitalizations or ER visits: 0 # of surgeries: 0  Review of Systems    Patient reports that his  overall health is unchanged compared to last year.  Patient Reported Readings (BP, Pulse, CBG, Weight, etc) CBG 152  Review of Systems: History obtained from chart review and the patient General ROS: negative  All other systems negative.  Pain Assessment Pain : No/denies pain     Current Medications & Allergies (verified) Allergies as of 12/26/2018      Reactions   Ace Inhibitors Swelling      Medication List       Accurate as of December 26, 2018 11:15 AM. If you have any questions, ask your nurse or doctor.        Accu-Chek Aviva Plus w/Device Kit Check glucose twice a day   Accu-Chek Aviva Soln Use with glucometer   Accu-Chek Softclix Lancets lancets Check glucose twice a day   albuterol (2.5 MG/3ML) 0.083% nebulizer solution Commonly known as: PROVENTIL Take 3 mLs (2.5 mg total) by nebulization every 6 (six) hours as needed for wheezing or shortness of breath.   albuterol 108 (90 Base) MCG/ACT inhaler Commonly known as: VENTOLIN HFA Inhale 2 puffs into the lungs every 4 (four) hours as needed for wheezing or shortness of breath.   aspirin EC 81 MG tablet Take 162 mg by mouth daily.   atorvastatin 40 MG tablet Commonly known as: LIPITOR Take 1 tablet (40 mg total) by mouth daily.   blood glucose meter kit and supplies Kit Daily. Onetouch Verio   Surveyor, quantity Misc   donepezil 5 MG tablet Commonly known as: [name] Take 1 tablet (5 mg total) by mouth at bedtime for 30 days.   fluticasone furoate-vilanterol 100-25 MCG/INH  Aepb Commonly known as: Breo Ellipta Inhale 1 puff into the lungs daily.   glucose blood test strip Commonly known as: Accu-Chek Aviva Plus Check glucose twice a day   losartan 50 MG tablet Commonly known as: COZAAR Take 1 tablet (50 mg total) by mouth daily.   memantine 10 MG tablet Commonly known as: NAMENDA Take 1 tablet (10 mg total) by mouth 2 (two) times daily.   Pen Needles 32G X 4 MM Misc 1 each by  Does not apply route 2 (two) times daily.   Tyler Aas FlexTouch 100 UNIT/ML Sopn FlexTouch Pen Generic drug: insulin degludec Inject 0.16 mLs (16 Units total) into the skin daily at 10 pm.       History (reviewed): Past Medical History:  Diagnosis Date  . Anxiety   . Asthma   . Choledocholithiasis 02/24/2013  . COPD (chronic obstructive pulmonary disease) ([place])   . CVA (cerebral vascular accident) ([place]) 2013   loss of memory  . Dementia ([place])   . Diabetes mellitus   . Hx-TIA (transient ischemic attack)   . Hyperlipidemia   . Hypertension    Past Surgical History:  Procedure Laterality Date  . CATARACT EXTRACTION W/PHACO Left 04/30/2016   Procedure: CATARACT EXTRACTION PHACO AND INTRAOCULAR LENS PLACEMENT LEFT EYE CDE=9.83;  Surgeon: Tonny Branch, MD;  Location: [facility];  Service: Ophthalmology;  Laterality: Left;  left  . CHOLECYSTECTOMY N/A 02/23/2013   Procedure: LAPAROSCOPIC CHOLECYSTECTOMY;  Surgeon: Jamesetta So, MD;  Location: [facility];  Service: General;  Laterality: N/A;  . ERCP N/A 02/24/2013   Procedure: ENDOSCOPIC RETROGRADE CHOLANGIOPANCREATOGRAPHY;  Surgeon: Daneil Dolin, MD;  Location: [facility];  Service: Endoscopy;  Laterality: N/A;  . ROTATOR CUFF REPAIR Right    Family History  Problem Relation Age of Onset  . Hypertension Mother   . Hypertension Father   . Diabetes Sister   . Diabetes Sister   . Heart disease Brother   . Emphysema Brother   . Colon cancer Neg Hx    Social History   Socioeconomic History  . Marital status: Married    Spouse name: [name]  . Number of children: 6  . Years of education: 11th  . Highest education level: 11th grade  Occupational History  . Occupation: Retired    Comment: security - Marine scientist   Social Needs  . Financial resource strain: Not very hard  . Food insecurity    Worry: Never true    Inability: Never true  . Transportation needs    Medical: No    Non-medical: No  Tobacco Use  . Smoking status: Never  Smoker  . Smokeless tobacco: Never Used  Substance and Sexual Activity  . Alcohol use: No  . Drug use: No  . Sexual activity: Not Currently  Lifestyle  . Physical activity    Days per week: 0 days    Minutes per session: 0 min  . Stress: Only a little  Relationships  . Social connections    Talks on phone: More than three times a week    Gets together: More than three times a week    Attends religious service: 1 to 4 times per year    Active member of club or organization: No    Attends meetings of clubs or organizations: Never    Relationship status: Married  Other Topics Concern  . Not on file  Social History Narrative   Patient lives at home with wife, linda = oldest daughter lives there  also    Caffeine Use: 1 cup of coffee and 2 can pepsi    Activities of Daily Living In your present state of health, do you have any difficulty performing the following activities: 12/26/2018 11/10/2018  Hearing? N N  Vision? N Y  Difficulty concentrating or making decisions? Tempie Donning  Walking or climbing stairs? N N  Dressing or bathing? N N  Doing errands, shopping? Tempie Donning  Preparing Food and eating ? N Y  Using the Toilet? N N  In the past six months, have you accidently leaked urine? N N  Do you have problems with loss of bowel control? N N  Managing your Medications? N Y  Managing your Finances? N Y  Housekeeping or managing your Housekeeping? N Y  Some recent data might be hidden    Patient Literacy How often do you need to have someone help you when you read instructions, pamphlets, or other written materials from your doctor or pharmacy?: 1 - Never What is the last grade level you completed in school?: 11th grade  Exercise Current Exercise Habits: The patient does not participate in regular exercise at present, Exercise limited by: None identified  Diet Patient reports consuming 2 meals a day and 4 snack(s) a day Patient reports that his primary diet is: Regular Patient reports  that she does have regular access to food.   Depression Screen PHQ 2/9 Scores 12/26/2018 11/27/2018 11/10/2018 10/07/2018 09/23/2018 07/14/2018 06/26/2018  PHQ - 2 Score 0 - 0 2 0 0 0  PHQ- 9 Score - - - 7 - - -  Exception Documentation - Medical reason - - - - -     Fall Risk Fall Risk  12/26/2018 11/27/2018 11/10/2018 09/23/2018 07/14/2018  Falls in the past year? 0 0 1 0 0  Number falls in past yr: 0 - 0 - -  Injury with Fall? 0 - 0 - -  Risk Factor Category  - - - - -  Risk for fall due to : - - - - -  Follow up - - - - -     Objective:  [name] seemed alert and oriented and he participated appropriately during our telephone visit.  Blood Pressure Weight BMI  BP Readings from Last 3 Encounters:  11/27/18 120/69  11/10/18 135/82  09/23/18 133/90   Wt Readings from Last 3 Encounters:  12/26/18 212 lb (96.2 kg)  11/27/18 212 lb (96.2 kg)  11/10/18 203 lb (92.1 kg)   BMI Readings from Last 1 Encounters:  12/26/18 28.75 kg/m    *Unable to obtain current vital signs, weight, and BMI due to telephone visit type  Hearing/Vision  . [name] did not seem to have difficulty with hearing/understanding during the telephone conversation . Reports that he has had a formal eye exam by an eye care professional within the past year . Reports that he has not had a formal hearing evaluation within the past year *Unable to fully assess hearing and vision during telephone visit type  Cognitive Function: 6CIT Screen 12/26/2018  What Year? 4 points  What month? 3 points  What time? 3 points  Count back from 20 4 points  Months in reverse 4 points  Repeat phrase 6 points  Total Score 24   (Normal:0-7, Significant for Dysfunction: >8)  Normal Cognitive Function Screening: No: Patient diagnosed with Dementia   Immunization & Health Maintenance Record Immunization History  Administered Date(s) Administered  . Influenza Split 03/26/2012  . Influenza, High  Dose Seasonal PF 03/28/2016,  04/08/2017, 04/09/2018  . Influenza,inj,Quad PF,6+ Mos 04/21/2014  . Influenza-Unspecified 05/10/2015  . Pneumococcal Conjugate-13 08/18/2015  . Pneumococcal Polysaccharide-23 06/08/2014  . Td 07/04/2016    Health Maintenance  Topic Date Due  . OPHTHALMOLOGY EXAM  02/02/2017  . FOOT EXAM  01/04/2019  . INFLUENZA VACCINE  02/07/2019  . HEMOGLOBIN A1C  05/30/2019  . TETANUS/TDAP  07/04/2026  . PNA vac Low Risk Adult  Completed       Assessment  This is a routine wellness examination for [name].  Health Maintenance: Due or Overdue Health Maintenance Due  Topic Date Due  . OPHTHALMOLOGY EXAM  02/02/2017    [name] does not need a referral for Community Assistance: Care Management:   no Social Work:    no Prescription Assistance:  no Nutrition/Diabetes Education:  no   Plan:  Personalized Goals Goals Addressed   None    Personalized Health Maintenance & Screening Recommendations  Glaucoma screening  Lung Cancer Screening Recommended: no (Low Dose CT Chest recommended if Age 62-80 years, 30 pack-year currently smoking OR have quit w/in past 15 years) Hepatitis C Screening recommended: no HIV Screening recommended: no  Advanced Directives: Written information was prepared per patient's request.  Referrals & Orders No orders of the defined types were placed in this encounter.   Follow-up Plan . Follow-up with Baruch Gouty, [name] as planned   I have personally reviewed and noted the following in the patient's chart:   . Medical and social history . Use of alcohol, tobacco or illicit drugs  . Current medications and supplements . Functional ability and status . Nutritional status . Physical activity . Advanced directives . List of other physicians . Hospitalizations, surgeries, and ER visits in previous 12 months . Vitals . Screenings to include cognitive, depression, and falls . Referrals and appointments  In addition, I have reviewed and  discussed with [name] certain preventive protocols, quality metrics, and best practice recommendations. A written personalized care plan for preventive services as well as general preventive health recommendations is available and can be mailed to the patient at his request.      Wardell Heath, LPN      [id number]

## 2018-12-29 ENCOUNTER — Ambulatory Visit (INDEPENDENT_AMBULATORY_CARE_PROVIDER_SITE_OTHER): Payer: Medicare HMO | Admitting: Licensed Clinical Social Worker

## 2018-12-29 DIAGNOSIS — E1169 Type 2 diabetes mellitus with other specified complication: Secondary | ICD-10-CM | POA: Diagnosis not present

## 2018-12-29 DIAGNOSIS — I152 Hypertension secondary to endocrine disorders: Secondary | ICD-10-CM

## 2018-12-29 DIAGNOSIS — I10 Essential (primary) hypertension: Secondary | ICD-10-CM

## 2018-12-29 DIAGNOSIS — Z8673 Personal history of transient ischemic attack (TIA), and cerebral infarction without residual deficits: Secondary | ICD-10-CM

## 2018-12-29 DIAGNOSIS — E1159 Type 2 diabetes mellitus with other circulatory complications: Secondary | ICD-10-CM

## 2018-12-29 DIAGNOSIS — E785 Hyperlipidemia, unspecified: Secondary | ICD-10-CM | POA: Diagnosis not present

## 2018-12-29 DIAGNOSIS — F0390 Unspecified dementia without behavioral disturbance: Secondary | ICD-10-CM | POA: Diagnosis not present

## 2018-12-29 DIAGNOSIS — Z794 Long term (current) use of insulin: Secondary | ICD-10-CM

## 2018-12-29 NOTE — Chronic Care Management (AMB) (Signed)
  Chronic Care Management    Clinical Social Work CCM Outreach Note  12/29/2018 Name: [name] MRN: [id number] DOB: 1936/01/07  [name] is a 83 y.o. year old male who is a primary care patient of [name], Connye Burkitt, [name] . The CCM team was consulted for assistance with assessment of psychosocial needs.   LCSW reached out to [employer] of client today by phone.   Social Determinants of Health:Risk for occasional social isolation; risk for decreased physical inactivity    Chronic Care Management from 10/07/2018 in [place]  PHQ-9 Total Score  7     Goals        . Client wants to talk with someone about anxiety or depression management (pt-stated)     Current Barriers:  [name] Kitchen Medical issues management . Anxiety issues  Clinical Social Work Clinical Goal(s):  Over the next 30 days, client/spouse will work with LCSW to address concerns related to management of anxiety symptoms of client. Interventions: . Talked with client/spouse about client use of relaxation techniques (taking walk,working in yard,being outdoors) . Talked with client/spouse about family support. . Encouraged client/spouse to talk with RN CM [name] about nursing needs of client . Talked with client/spouse about client completion of daily ADLs for client  Patient Self Care Activities:  . Attends all scheduled provider appointments . Performs ADL's independently . Calls provider office for new concerns or questions   Plan: Client/spouse to talk with RN CM to discuss nursing needs of client LCSW to call client/spouse in next 3 weeks to discuss client management of anxiety symptoms Client to attend scheduled client medical appointments Client to use relaxation techniques to help manage anxiety symptoms.  Initial goal documentation    .          Spouse of client reported that client had no pain issues .Client likes to walk short distances outdoors. Client is eating  adequately.  Client had phone call with LPN at [facility] last week for annual wellness visit. LCSW reminded spouse of client of CCM program services. LCSW encouraged client or spouse to call RNCM as needed to discuss nursing needs of client  Follow Up Plan: LCSW to call client/spouse in next 3 weeks to discuss client management of anxiety symptoms  [name] MSW, LCSW Licensed Clinical Social Worker [facility] Care Management [phone]

## 2018-12-29 NOTE — Patient Instructions (Addendum)
Licensed Clinical Social Worker Visit Information  Materials Provided: No     . Client wants to talk with someone about anxiety or depression management (pt-stated)     Current Barriers:  [name] Kitchen Medical issues management . Anxiety issues  Clinical Social Work Clinical Goal(s):  Over the next 30 days, client/spouse will work with LCSW to address concerns related to management of anxiety symptoms of client. Interventions: . Talked with client/spouse about client use of relaxation techniques (taking walk,working in yard,being outdoors) . Talked with client/spouse about family support. . Encouraged client/spouse to talk with RN CM [name] about nursing needs of client . Talked with client/spouse about client completion of daily ADLs for client  Patient Self Care Activities:  . Attends all scheduled provider appointments . Performs ADL's independently . Calls provider office for new concerns or questions   Plan: Client/spouse to talk with RN CM to discuss nursing needs of client LCSW to call client/spouse in next 3 weeks to discuss client management of anxiety symptoms Client to attend scheduled client medical appointments Client to use relaxation techniques to help manage anxiety symptoms.  Initial goal documentation      Spouse of client reported that client had no pain issues .Client likes to walk short distances outdoors. Client is eating adequately.  Client had phone call with LPN at [facility] last week for annual wellness visit. LCSW reminded spouse of client of CCM program services. LCSW encouraged client or spouse to call RNCM as needed to discuss nursing needs of client  Follow Up Plan: LCSW to call client or spouse of client in next 3 weeks to discuss client management of anxiety symptoms  Patient/spouse of patient verbalized understanding of patient instructions and declined a print  copy of patient instructions   [name].Jadd Gasior MSW, LCSW Licensed Clinical Social  Worker [facility] Care Management [phone]

## 2019-01-08 ENCOUNTER — Telehealth: Payer: Medicare HMO | Admitting: *Deleted

## 2019-01-19 ENCOUNTER — Ambulatory Visit (INDEPENDENT_AMBULATORY_CARE_PROVIDER_SITE_OTHER): Payer: Medicare HMO | Admitting: Licensed Clinical Social Worker

## 2019-01-19 DIAGNOSIS — I10 Essential (primary) hypertension: Secondary | ICD-10-CM | POA: Diagnosis not present

## 2019-01-19 DIAGNOSIS — I152 Hypertension secondary to endocrine disorders: Secondary | ICD-10-CM

## 2019-01-19 DIAGNOSIS — F0390 Unspecified dementia without behavioral disturbance: Secondary | ICD-10-CM | POA: Diagnosis not present

## 2019-01-19 DIAGNOSIS — E785 Hyperlipidemia, unspecified: Secondary | ICD-10-CM

## 2019-01-19 DIAGNOSIS — E1169 Type 2 diabetes mellitus with other specified complication: Secondary | ICD-10-CM | POA: Diagnosis not present

## 2019-01-19 DIAGNOSIS — E1159 Type 2 diabetes mellitus with other circulatory complications: Secondary | ICD-10-CM

## 2019-01-19 DIAGNOSIS — Z794 Long term (current) use of insulin: Secondary | ICD-10-CM

## 2019-01-19 DIAGNOSIS — Z8673 Personal history of transient ischemic attack (TIA), and cerebral infarction without residual deficits: Secondary | ICD-10-CM

## 2019-01-19 NOTE — Chronic Care Management (AMB) (Signed)
  Care Management Note   [name] is a 83 y.o. year old male who is a primary care patient of [name], Connye Burkitt, [name]. The CM team was consulted for assistance with chronic disease management and care coordination.   I reached out to [name] by phone today.   Review of patient status, including review of consultants reports, relevant laboratory and other test results, and collaboration with appropriate care team members and the patient's provider was performed as part of comprehensive patient evaluation and provision of chronic care management services.   Social Determinants of Health:risk of social isolation; risk of inactivity (physical inactivity)    Chronic Care Management from 10/07/2018 in [place]  PHQ-9 Total Score  7     GAD 7 : Generalized Anxiety Score 10/07/2018  Nervous, Anxious, on Edge 1  Control/stop worrying 0  Worry too much - different things 1  Trouble relaxing 1  Restless 1  Easily annoyed or irritable 0  Afraid - awful might happen 0  Total GAD 7 Score 4  Anxiety Difficulty Somewhat difficult    Goals    . Client wants to talk with someone about anxiety or depression management (pt-stated)     Current Barriers:  [name] Kitchen Medical issues management . Anxiety issues  Clinical Social Work Clinical Goal(s):  Over the next 30 days, client/spouse will work with [name] to address concerns related to management of anxiety symptoms of client. Interventions: . Talked with client/spouse about client use of relaxation techniques (taking walk,working in yard,being outdoors) . Talked with client/spouse about family support. . Encouraged client/spouse to talk with RN CM [name] about nursing needs of client . Talked with client/spouse about client completion of daily ADLs for client  Patient Self Care Activities:  . Attends all scheduled provider appointments . Performs ADL's independently . Calls provider office for new  concerns or questions   Plan: Client/spouse to talk with RN CM to discuss nursing needs of client [name] to call client/spouse in next 3 weeks to discuss client management of anxiety symptoms Client to attend scheduled client medical appointments Client to use relaxation techniques to help manage anxiety symptoms.  Initial goal documentation    Client likes walking outdoors as a way to relax. Client is eating well.  Client is sleeping adequately. Client has some pain in left leg( feels that left knee sometimes gives way a little when walking).   Client has prescribed medication and is taking medications as prescribed . Client enjoys socializing with his brother who lives nearby. [name] talked with [name] about client completion of ADls.  She said client sometimes does not want to take shower as scheduled.  [name] said client has pulmonary appointment in September of 2020.  [name] said client has appointment on 03/03/2019 for cataract removal. [name] encouraged [name] to call RNCM as needed to discuss nursing needs of client. [name] was appreciative of phone call from [name] on 01/19/2019  Follow Up Plan: [name] to call client or spouse of client in next 3 weeks to discuss client management of anxiety symptoms  [name] MSW, [name] Licensed Clinical Social Worker [facility] Care Management [phone]

## 2019-01-19 NOTE — Patient Instructions (Signed)
Licensed Clinical Social Worker Visit Information  Goals we discussed today:   Goals        . Client wants to talk with someone about anxiety or depression management (pt-stated)     Current Barriers:  [name] Kitchen Medical issues management . Anxiety issues  Clinical Social Work Clinical Goal(s):  Over the next 30 days, client/spouse will work with LCSW to address concerns related to management of anxiety symptoms of client. Interventions: . Talked with client/spouse about client use of relaxation techniques (taking walk,working in yard,being outdoors) . Talked with client/spouse about family support. . Encouraged client/spouse to talk with RN CM [name] about nursing needs of client . Talked with client/spouse about client completion of daily ADLs for client  Patient Self Care Activities:  . Attends all scheduled provider appointments . Performs ADL's independently . Calls provider office for new concerns or questions   Plan: Client/spouse to talk with RN CM to discuss nursing needs of client LCSW to call client/spouse in next 3 weeks to discuss client management of anxiety symptoms Client to attend scheduled client medical appointments Client to use relaxation techniques to help manage anxiety symptoms.  Initial goal documentation     Materials Provided: No  Follow Up Plan: LCSW to call client/spouse of client in next 3 weeks to discuss client management of anxiety symptoms.  The patient/spouse, [name], verbalized understanding of instructions provided today and declined a print copy of patient instruction materials.   [name].Mathayus Stanbery MSW, LCSW Licensed Clinical Social Worker [facility] Care Management [phone]

## 2019-02-09 ENCOUNTER — Ambulatory Visit (INDEPENDENT_AMBULATORY_CARE_PROVIDER_SITE_OTHER): Payer: Medicare HMO | Admitting: Licensed Clinical Social Worker

## 2019-02-09 DIAGNOSIS — E785 Hyperlipidemia, unspecified: Secondary | ICD-10-CM | POA: Diagnosis not present

## 2019-02-09 DIAGNOSIS — I10 Essential (primary) hypertension: Secondary | ICD-10-CM | POA: Diagnosis not present

## 2019-02-09 DIAGNOSIS — E1159 Type 2 diabetes mellitus with other circulatory complications: Secondary | ICD-10-CM | POA: Diagnosis not present

## 2019-02-09 DIAGNOSIS — F0390 Unspecified dementia without behavioral disturbance: Secondary | ICD-10-CM | POA: Diagnosis not present

## 2019-02-09 DIAGNOSIS — Z794 Long term (current) use of insulin: Secondary | ICD-10-CM | POA: Diagnosis not present

## 2019-02-09 DIAGNOSIS — E1169 Type 2 diabetes mellitus with other specified complication: Secondary | ICD-10-CM | POA: Diagnosis not present

## 2019-02-09 DIAGNOSIS — Z8673 Personal history of transient ischemic attack (TIA), and cerebral infarction without residual deficits: Secondary | ICD-10-CM

## 2019-02-09 NOTE — Patient Instructions (Signed)
Licensed Clinical Social Worker Visit Information  Goals we discussed today:   Goals        . Client wants to talk with someone about anxiety or depression management (pt-stated)     Current Barriers:  [name] Kitchen Medical issues management . Anxiety issues  Clinical Social Work Clinical Goal(s):  Over the next 30 days, client/spouse will work with LCSW to address concerns related to management of anxiety symptoms of client. Interventions: . Talked with client/spouse about client use of relaxation techniques (taking walk,working in yard,being outdoors) . Talked with client/spouse about family support. . Encouraged client/spouse to talk with RN CM [name] about nursing needs of client . Talked with client/spouse about client completion of daily ADLs for client  Patient Self Care Activities:  . Attends all scheduled provider appointments . Performs ADL's independently . Calls provider office for new concerns or questions   Plan: Client/spouse to talk with RN CM to discuss nursing needs of client LCSW to call client/spouse in next 3 weeks to discuss client management of anxiety symptoms Client to attend scheduled client medical appointments Client to use relaxation techniques to help manage anxiety symptoms.  Initial goal documentation           Materials Provided: No  Follow Up Plan:  LCSW to call client/spouse in next 3 weeks to discuss client management of anxiety symptoms  The patient/spouse Kesean Serviss verbalized understanding of instructions provided today and declined a print copy of patient instruction materials.   [name].Elford Evilsizer MSW, LCSW Licensed Clinical Social Worker [facility] Care Management [phone]

## 2019-02-09 NOTE — Chronic Care Management (AMB) (Signed)
  Care Management Note   [name] is a 83 y.o. year old male who is a primary care patient of [name], Connye Burkitt, [name]. The CM team was consulted for assistance with chronic disease management and care coordination.   I reached out to [name],  by phone today.    Review of patient status, including review of consultants reports, relevant laboratory and other test results, and collaboration with appropriate care team members and the patient's provider was performed as part of comprehensive patient evaluation and provision of chronic care management services.   Social Determinants of Health:  Risk of Social Isolation; risk of physical inactivity    Chronic Care Management from 10/07/2018 in [place]  PHQ-9 Total Score  7     GAD 7 : Generalized Anxiety Score 10/07/2018  Nervous, Anxious, on Edge 1  Control/stop worrying 0  Worry too much - different things 1  Trouble relaxing 1  Restless 1  Easily annoyed or irritable 0  Afraid - awful might happen 0  Total GAD 7 Score 4  Anxiety Difficulty Somewhat difficult         . Client wants to talk with someone about anxiety or depression management (pt-stated)     Current Barriers:  [name] Kitchen Medical issues management . Anxiety issues  Clinical Social Work Clinical Goal(s):  Over the next 30 days, client/spouse will work with [name] to address concerns related to management of anxiety symptoms of client.  Interventions: . Talked with client/spouse about client use of relaxation techniques (taking walk,working in yard,being outdoors) . Talked with client/spouse about family support. . Encouraged client/spouse to talk with RN CM [name] about nursing needs of client . Talked with client/spouse about client completion of daily ADLs for client  Patient Self Care Activities:  . Attends all scheduled provider appointments . Performs ADL's independently . Calls provider office for new concerns  or questions   Plan: Client/spouse to talk with RN CM to discuss nursing needs of client [name] to call client/spouse in next 3 weeks to discuss client management of anxiety symptoms Client to attend scheduled client medical appointments Client to use relaxation techniques to help manage anxiety symptoms.  Initial goal documentation   Client likes walking outdoors as a way to relax. Client is eating well.  Client is sleeping adequately. Client has some pain in left leg( feels that left knee sometimes gives way a little when walking).   Client has prescribed medications and is taking medications as prescribed . Client enjoys socializing with his brother who lives nearby. [name] talked with [name] about client completion of ADLs.  She said client sometimes does not want to take shower as scheduled.  [name] said client has pulmonary appointment in September of 2020.  [name] said client has appointment on 03/03/2019 for cataract removal. Client has talked with [name] about difficulty seeing.  [name] reported that client has previously had one cataract removed. She said he is getting along socially well with others at present.  [name] did not mention any nursing needs for client at present  Follow Up Plan: [name] to call client/spouse in next 3 weeks to talk with client about client management of anxiety symptoms  [name].[name] MSW, [name] Licensed Clinical Social Worker [facility] Care [facility] [phone]

## 2019-03-02 ENCOUNTER — Ambulatory Visit: Payer: Self-pay | Admitting: Licensed Clinical Social Worker

## 2019-03-02 DIAGNOSIS — E1159 Type 2 diabetes mellitus with other circulatory complications: Secondary | ICD-10-CM

## 2019-03-02 DIAGNOSIS — E1169 Type 2 diabetes mellitus with other specified complication: Secondary | ICD-10-CM

## 2019-03-02 DIAGNOSIS — F0390 Unspecified dementia without behavioral disturbance: Secondary | ICD-10-CM

## 2019-03-02 DIAGNOSIS — Z8673 Personal history of transient ischemic attack (TIA), and cerebral infarction without residual deficits: Secondary | ICD-10-CM

## 2019-03-02 NOTE — Patient Instructions (Signed)
Licensed Clinical Social Worker Visit Information  Goals we discussed today:  Goals            . Client wants to talk with someone about anxiety or depression management (pt-stated)     Current Barriers:  [name] Kitchen Medical issues management . Anxiety issues  Clinical Social Work Clinical Goal(s):  Over the next 30 days, client/spouse will work with LCSW to address concerns related to management of anxiety symptoms of client. Interventions: . Talked with client/spouse about client use of relaxation techniques (taking walk,working in yard,being outdoors) . Talked with client/spouse about family support. . Encouraged client/spouse to talk with RN CM [name] about nursing needs of client . Talked with client/spouse about client completion of daily ADLs for client  Patient Self Care Activities:  . Attends all scheduled provider appointments . Performs ADL's independently . Calls provider office for new concerns or questions   Plan: Client/spouse to talk with RN CM to discuss nursing needs of client LCSW to call client/spouse in next 3 weeks to discuss client management of anxiety symptoms Client to attend scheduled client medical appointments Client to use relaxation techniques to help manage anxiety symptoms.  Initial goal documentation     Materials Provided: No  Follow Up Plan:  LCSW to call client/spouse in next 3 weeks to discuss client management of anxiety symtpoms  The patient/[name], spouse of patient,  verbalized understanding of instructions provided today and declined a print copy of patient instruction materials.   [name] MSW, LCSW Licensed Clinical Social Worker [facility] [phone]

## 2019-03-02 NOTE — Chronic Care Management (AMB) (Signed)
Care Management Note   [name] is a 83 y.o. year old male who is a primary care patient of [name], Connye Burkitt, [name]. The CM team was consulted for assistance with chronic disease management and care coordination.   I reached out to [name] by phone today.   Review of patient status, including review of consultants reports, relevant laboratory and other test results, and collaboration with appropriate care team members and the patient's provider was performed as part of comprehensive patient evaluation and provision of chronic care management services.   Social Determinants of Health: risk of social isolation; risk of physical inactivity    Chronic Care Management from 10/07/2018 in [place]  PHQ-9 Total Score  7     GAD 7 : Generalized Anxiety Score 10/07/2018  Nervous, Anxious, on Edge 1  Control/stop worrying 0  Worry too much - different things 1  Trouble relaxing 1  Restless 1  Easily annoyed or irritable 0  Afraid - awful might happen 0  Total GAD 7 Score 4  Anxiety Difficulty Somewhat difficult    Medications    ACCU-CHEK SOFTCLIX LANCETS lancets    albuterol (PROVENTIL HFA;VENTOLIN HFA) 108 (90 Base) MCG/ACT inhaler    albuterol (PROVENTIL) (2.5 MG/3ML) 0.083% nebulizer solution    aspirin EC 81 MG tablet    atorvastatin (LIPITOR) 40 MG tablet    Blood Glucose Calibration (ACCU-CHEK AVIVA) SOLN    blood glucose meter kit and supplies KIT    Blood Glucose Monitoring Suppl (ACCU-CHEK AVIVA PLUS) w/Device KIT    donepezil ([name]) 5 MG tablet(Expired)    fluticasone furoate-vilanterol (BREO ELLIPTA) 100-25 MCG/INH AEPB    glucose blood (ACCU-CHEK AVIVA PLUS) test strip    Insulin Pen Needle (PEN NEEDLES) 32G X 4 MM MISC    losartan (COZAAR) 50 MG tablet    memantine (NAMENDA) 10 MG tablet    Nebulizers (COMP AIR COMPRESSOR NEBULIZER) MISC    TRESIBA FLEXTOUCH 100 UNIT/ML SOPN FlexTouch Pen      Goals        . Client wants to talk  with someone about anxiety or depression management (pt-stated)     Current Barriers:  [name] Kitchen Medical issues management . Anxiety issues  Clinical Social Work Clinical Goal(s):  Over the next 30 days, client/spouse will work with LCSW to address concerns related to management of anxiety symptoms of client. Interventions: . Talked with client/spouse about client use of relaxation techniques (taking walk,working in yard,being outdoors) . Talked with client/spouse about family support. . Encouraged client/spouse to talk with RN CM [name] about nursing needs of client . Talked with client/spouse about client completion of daily ADLs for client  Patient Self Care Activities:  . Attends all scheduled provider appointments . Performs ADL's independently . Calls provider office for new concerns or questions   Plan: Client/spouse to talk with RN CM to discuss nursing needs of client LCSW to call client/spouse in next 3 weeks to discuss client management of anxiety symptoms Client to attend scheduled client medical appointments Client to use relaxation techniques to help manage anxiety symptoms.  Initial goal documentation      Client has prescribed medications and is taking medications as prescribed. Client is sleeping more in the day time hours and he is awake more at night.He is eating well. Client uses a cane occasionally to help him walk. Client likes being outdoors and doing outdoor activities. Client enjoys visiting with his brother occasionally. [name] said  client is scheduled for an appointment tomorrow for eye examination. LCSW encouraged client or [name] to talk with RNCM as needed to discuss nursing needs of client  Follow Up Plan: LCSW to call client or spouse of client in next 3 weeks to talk with client or spouse about client management of anxiety symptoms.   [name].Teliah Buffalo MSW, LCSW Licensed Clinical Social Worker [facility] Care  Management [phone]

## 2019-03-03 DIAGNOSIS — Z961 Presence of intraocular lens: Secondary | ICD-10-CM | POA: Diagnosis not present

## 2019-03-03 DIAGNOSIS — E113293 Type 2 diabetes mellitus with mild nonproliferative diabetic retinopathy without macular edema, bilateral: Secondary | ICD-10-CM | POA: Diagnosis not present

## 2019-03-03 DIAGNOSIS — H25811 Combined forms of age-related cataract, right eye: Secondary | ICD-10-CM | POA: Diagnosis not present

## 2019-03-03 DIAGNOSIS — H11041 Peripheral pterygium, stationary, right eye: Secondary | ICD-10-CM | POA: Diagnosis not present

## 2019-03-03 DIAGNOSIS — Z794 Long term (current) use of insulin: Secondary | ICD-10-CM | POA: Diagnosis not present

## 2019-03-06 ENCOUNTER — Other Ambulatory Visit: Payer: Self-pay | Admitting: Family Medicine

## 2019-03-06 DIAGNOSIS — F0150 Vascular dementia without behavioral disturbance: Secondary | ICD-10-CM

## 2019-03-24 ENCOUNTER — Ambulatory Visit (INDEPENDENT_AMBULATORY_CARE_PROVIDER_SITE_OTHER): Payer: Medicare HMO | Admitting: Licensed Clinical Social Worker

## 2019-03-24 DIAGNOSIS — E1169 Type 2 diabetes mellitus with other specified complication: Secondary | ICD-10-CM

## 2019-03-24 DIAGNOSIS — Z8673 Personal history of transient ischemic attack (TIA), and cerebral infarction without residual deficits: Secondary | ICD-10-CM

## 2019-03-24 DIAGNOSIS — I10 Essential (primary) hypertension: Secondary | ICD-10-CM | POA: Diagnosis not present

## 2019-03-24 DIAGNOSIS — E1159 Type 2 diabetes mellitus with other circulatory complications: Secondary | ICD-10-CM | POA: Diagnosis not present

## 2019-03-24 DIAGNOSIS — Z794 Long term (current) use of insulin: Secondary | ICD-10-CM | POA: Diagnosis not present

## 2019-03-24 DIAGNOSIS — E785 Hyperlipidemia, unspecified: Secondary | ICD-10-CM

## 2019-03-24 DIAGNOSIS — F0390 Unspecified dementia without behavioral disturbance: Secondary | ICD-10-CM | POA: Diagnosis not present

## 2019-03-24 NOTE — Patient Instructions (Signed)
Licensed Clinical Social Worker Visit Information  Goals we discussed today:  Goals        . Client wants to talk with someone about anxiety or depression management (pt-stated)     Current Barriers:  [name] Kitchen Medical issues management . Anxiety issues  Clinical Social Work Clinical Goal(s):  Over the next 30 days, client/spouse will work with LCSW to address concerns related to management of anxiety symptoms of client. Interventions: . Talked with client/spouse about client use of relaxation techniques (taking walk,working in yard,being outdoors) . Talked with client/spouse about family support. . Encouraged client/spouse to talk with RN CM [name] about nursing needs of client . Talked with client/spouse about client completion of daily ADLs for client  Patient Self Care Activities:  . Attends all scheduled provider appointments . Performs ADL's independently . Calls provider office for new concerns or questions   Plan: Client/spouse to talk with RN CM to discuss nursing needs of client LCSW to call client/spouse in next 3 weeks to discuss client management of anxiety symptoms Client to attend scheduled client medical appointments Client to use relaxation techniques to help manage anxiety symptoms.  Initial goal documentation    .    Materials Provided: No  Follow Up Plan: LCSW to call client/spouse in next 3 weeks to discuss client management of anxiety symptoms  The patient [name], spouse of patient, verbalized understanding of instructions provided today and declined a print copy of patient instruction materials.   [name] MSW, LCSW Licensed Clinical Social Worker [facility] [phone]

## 2019-03-24 NOTE — Chronic Care Management (AMB) (Signed)
Care Management Note   [name] is a 83 y.o. year old male who is a primary care patient of [name], Connye Burkitt, [name]. The CM team was consulted for assistance with chronic disease management and care coordination.   I reached out to Wayne Sever, spouse of client by phone today.   Review of patient status, including review of consultants reports, relevant laboratory and other test results, and collaboration with appropriate care team members and the patient's provider was performed as part of comprehensive patient evaluation and provision of chronic care management services.   Social Determinants of Health: risk of social isolation; risk of physical inactivity    Chronic Care Management from 10/07/2018 in [place]  PHQ-9 Total Score  7     GAD 7 : Generalized Anxiety Score 10/07/2018  Nervous, Anxious, on Edge 1  Control/stop worrying 0  Worry too much - different things 1  Trouble relaxing 1  Restless 1  Easily annoyed or irritable 0  Afraid - awful might happen 0  Total GAD 7 Score 4  Anxiety Difficulty Somewhat difficult   Medications    ACCU-CHEK SOFTCLIX LANCETS lancets    albuterol (PROVENTIL HFA;VENTOLIN HFA) 108 (90 Base) MCG/ACT inhaler    albuterol (PROVENTIL) (2.5 MG/3ML) 0.083% nebulizer solution    aspirin EC 81 MG tablet    atorvastatin (LIPITOR) 40 MG tablet    Blood Glucose Calibration (ACCU-CHEK AVIVA) SOLN    blood glucose meter kit and supplies KIT    Blood Glucose Monitoring Suppl (ACCU-CHEK AVIVA PLUS) w/Device KIT    donepezil ([name]) 5 MG tablet(Expired)    fluticasone furoate-vilanterol (BREO ELLIPTA) 100-25 MCG/INH AEPB    glucose blood (ACCU-CHEK AVIVA PLUS) test strip    Insulin Pen Needle (PEN NEEDLES) 32G X 4 MM MISC    losartan (COZAAR) 50 MG tablet    memantine (NAMENDA) 10 MG tablet    Nebulizers (COMP AIR COMPRESSOR NEBULIZER) MISC    TRESIBA FLEXTOUCH 100 UNIT/ML SOPN FlexTouch Pen     Goals     . Client wants to talk with someone about anxiety or depression management (pt-stated)     Current Barriers:  [name] Kitchen Medical issues management . Anxiety issues  Clinical Social Work Clinical Goal(s):  Over the next 30 days, client/spouse will work with LCSW to address concerns related to management of anxiety symptoms of client. Interventions: . Talked with client/spouse about client use of relaxation techniques (taking walk,working in yard,being outdoors) . Talked with client/spouse about family support. . Encouraged client/spouse to talk with RN CM [name] about nursing needs of client . Talked with client/spouse about client completion of daily ADLs for client  Patient Self Care Activities:  . Attends all scheduled provider appointments . Performs ADL's independently . Calls provider office for new concerns or questions   Plan: Client/spouse to talk with RN CM to discuss nursing needs of client LCSW to call client/spouse in next 3 weeks to discuss client management of anxiety symptoms Client to attend scheduled client medical appointments Client to use relaxation techniques to help manage anxiety symptoms.  Initial goal documentation      Client has prescribed medications and is taking medications as prescribed. Client is having some difficulty sleeping. He is eating well. Client uses a cane occasionally to help him walk. Client likes being outdoors and doing outdoor activities. Client enjoys visiting with his brother occasionally. Client also enjoys visits with his son. Client needs assistance with some ADLs. Client  is attending all scheduled client medical appointments. Client is scheduled to have cataract surgery in November of 2020. Client has not had any recent falls. Keanu Lesniak is supportive of client and helps client as needed with ADLs, meal preparation, and medication administration for client   Follow Up Plan: LCSW to call client or spouse of client in the next 3 weeks  to discuss client management of anxiety symptoms  [name].Kentrel Clevenger MSW, LCSW Licensed Clinical Social Worker [facility] [phone]

## 2019-03-25 ENCOUNTER — Telehealth: Payer: Self-pay | Admitting: Family Medicine

## 2019-03-25 NOTE — Telephone Encounter (Signed)
Patients wife states that he has been taking his medication in the mornings.  Advised wife to switch medications to night to see if that helps.

## 2019-03-25 NOTE — Telephone Encounter (Signed)
Great, thanks

## 2019-03-25 NOTE — Telephone Encounter (Signed)
When is he taking the medications? He may need to change dosing times to promote sleep at night. If changing the times he takes the medications does not help, he will need to be seen.

## 2019-04-08 ENCOUNTER — Ambulatory Visit: Payer: Medicare HMO

## 2019-04-15 ENCOUNTER — Ambulatory Visit (INDEPENDENT_AMBULATORY_CARE_PROVIDER_SITE_OTHER): Payer: Medicare HMO | Admitting: Licensed Clinical Social Worker

## 2019-04-15 DIAGNOSIS — E1169 Type 2 diabetes mellitus with other specified complication: Secondary | ICD-10-CM | POA: Diagnosis not present

## 2019-04-15 DIAGNOSIS — E1159 Type 2 diabetes mellitus with other circulatory complications: Secondary | ICD-10-CM | POA: Diagnosis not present

## 2019-04-15 DIAGNOSIS — I10 Essential (primary) hypertension: Secondary | ICD-10-CM | POA: Diagnosis not present

## 2019-04-15 DIAGNOSIS — Z794 Long term (current) use of insulin: Secondary | ICD-10-CM

## 2019-04-15 DIAGNOSIS — Z8673 Personal history of transient ischemic attack (TIA), and cerebral infarction without residual deficits: Secondary | ICD-10-CM

## 2019-04-15 DIAGNOSIS — F0390 Unspecified dementia without behavioral disturbance: Secondary | ICD-10-CM | POA: Diagnosis not present

## 2019-04-15 DIAGNOSIS — E785 Hyperlipidemia, unspecified: Secondary | ICD-10-CM | POA: Diagnosis not present

## 2019-04-15 NOTE — Chronic Care Management (AMB) (Signed)
  Care Management Note   [name] is a 83 y.o. year old male who is a primary care patient of [name], Connye Burkitt, [name]. The CM team was consulted for assistance with chronic disease management and care coordination.   I reached out to [name], spouse of client by phone today.   Review of patient status, including review of consultants reports, relevant laboratory and other test results, and collaboration with appropriate care team members and the patient's provider was performed as part of comprehensive patient evaluation and provision of chronic care management services.   Social Determinants of health: risk of physical inactivity; risk of social isolation    Chronic Care Management from 10/07/2018 in [place]  PHQ-9 Total Score  7     GAD 7 : Generalized Anxiety Score 10/07/2018  Nervous, Anxious, on Edge 1  Control/stop worrying 0  Worry too much - different things 1  Trouble relaxing 1  Restless 1  Easily annoyed or irritable 0  Afraid - awful might happen 0  Total GAD 7 Score 4  Anxiety Difficulty Somewhat difficult   Medications    ACCU-CHEK SOFTCLIX LANCETS lancets    albuterol (PROVENTIL HFA;VENTOLIN HFA) 108 (90 Base) MCG/ACT inhaler    albuterol (PROVENTIL) (2.5 MG/3ML) 0.083% nebulizer solution    aspirin EC 81 MG tablet    atorvastatin (LIPITOR) 40 MG tablet    Blood Glucose Calibration (ACCU-CHEK AVIVA) SOLN    blood glucose meter kit and supplies KIT    Blood Glucose Monitoring Suppl (ACCU-CHEK AVIVA PLUS) w/Device KIT    donepezil ([name]) 5 MG tablet(Expired)    fluticasone furoate-vilanterol (BREO ELLIPTA) 100-25 MCG/INH AEPB    glucose blood (ACCU-CHEK AVIVA PLUS) test strip    Insulin Pen Needle (PEN NEEDLES) 32G X 4 MM MISC    losartan (COZAAR) 50 MG tablet    memantine (NAMENDA) 10 MG tablet    Nebulizers (COMP AIR COMPRESSOR NEBULIZER) MISC    TRESIBA FLEXTOUCH 100 UNIT/ML SOPN FlexTouch Pen      Goals    .  Client wants to talk with someone about anxiety or depression management (pt-stated)     Current Barriers:  [name] Kitchen Medical issues management . Anxiety issues  Clinical Social Work Clinical Goal(s):  Over the next 30 days, client/spouse will work with LCSW to address concerns related to management of anxiety symptoms of client. Interventions: . Talked with [name], spouse of client about  ADTS and in home support services through ADTS . Talked with [name], spouse of client about Medicaid application process for [name] . Encouraged [name] or client to talk with RN CM [name] about nursing needs of client  Patient Self Care Activities:  . Attends all scheduled provider appointments . Performs ADL's independently . Calls provider office for new concerns or questions   Plan: Client/spouse to talk with RN CM to discuss nursing needs of client LCSW to call client/spouse in next 3 weeks to discuss client management of anxiety symptoms Client to attend scheduled client medical appointments Client to use relaxation techniques to help manage anxiety symptoms.  Initial goal documentation      Follow Up Plan: LCSW to call client or spouse of client in the next 3 weeks to discuss client management of anxiety symptoms  [name].[name] MSW, LCSW Licensed Clinical Social Worker [facility] [phone]

## 2019-04-15 NOTE — Patient Instructions (Signed)
Licensed Clinical Social Worker Visit Information  Goals we discussed today:  Goals            . Client wants to talk with someone about anxiety or depression management (pt-stated)     Current Barriers:  [name] Kitchen Medical issues management . Anxiety issues  Clinical Social Work Clinical Goal(s):  Over the next 30 days, client/spouse will work with LCSW to address concerns related to management of anxiety symptoms of client. Interventions: . Talked with [name] about ADTS and in home support programs through ADTS  . Encouraged client/spouse to talk with RN CM [name] about nursing needs of client . Talked with [name] about Medicaid application process for [name]  Patient Self Care Activities:  . Attends all scheduled provider appointments . Performs ADL's independently . Calls provider office for new concerns or questions   Plan: Client/spouse to talk with RN CM to discuss nursing needs of client LCSW to call client/spouse in next 3 weeks to discuss client management of anxiety symptoms Client to attend scheduled client medical appointments Client to use relaxation techniques to help manage anxiety symptoms.  Initial goal documentation          Materials Provided: No  Follow Up Plan: LCSW to call client or [name] in next 3 weeks to discuss client management of anxiety issues  The patient/[name], spouse of patient,  verbalized understanding of instructions provided today and declined a print copy of patient instruction materials.   [name].[name] MSW, LCSW Licensed Clinical Social Worker [facility] [phone]

## 2019-04-16 ENCOUNTER — Ambulatory Visit (INDEPENDENT_AMBULATORY_CARE_PROVIDER_SITE_OTHER): Payer: Medicare HMO

## 2019-04-16 DIAGNOSIS — Z23 Encounter for immunization: Secondary | ICD-10-CM | POA: Diagnosis not present

## 2019-05-06 ENCOUNTER — Telehealth: Payer: Self-pay

## 2019-05-18 ENCOUNTER — Ambulatory Visit (INDEPENDENT_AMBULATORY_CARE_PROVIDER_SITE_OTHER): Payer: Medicare HMO | Admitting: Licensed Clinical Social Worker

## 2019-05-18 DIAGNOSIS — F0390 Unspecified dementia without behavioral disturbance: Secondary | ICD-10-CM | POA: Diagnosis not present

## 2019-05-18 DIAGNOSIS — E785 Hyperlipidemia, unspecified: Secondary | ICD-10-CM

## 2019-05-18 DIAGNOSIS — Z794 Long term (current) use of insulin: Secondary | ICD-10-CM

## 2019-05-18 DIAGNOSIS — I10 Essential (primary) hypertension: Secondary | ICD-10-CM | POA: Diagnosis not present

## 2019-05-18 DIAGNOSIS — E1169 Type 2 diabetes mellitus with other specified complication: Secondary | ICD-10-CM

## 2019-05-18 DIAGNOSIS — E1159 Type 2 diabetes mellitus with other circulatory complications: Secondary | ICD-10-CM | POA: Diagnosis not present

## 2019-05-18 DIAGNOSIS — Z8673 Personal history of transient ischemic attack (TIA), and cerebral infarction without residual deficits: Secondary | ICD-10-CM

## 2019-05-18 NOTE — Patient Instructions (Signed)
Licensed Clinical Social Worker Visit Information  Goals we discussed today:  Goals        . Client wants to talk with someone about anxiety or depression management (pt-stated)     Current Barriers:  [name] Kitchen Medical issues management . Anxiety issues  Clinical Social Work Clinical Goal(s):  Over the next 30 days, client/spouse will work with LCSW to address concerns related to management of anxiety symptoms of client. Interventions: . Talked with client/spouse about client use of relaxation techniques (taking walk,working in yard,being outdoors) . Talked with client/spouse about family support. . Encouraged client/spouse to talk with RN CM [name] about nursing needs of client . Talked with client/spouse about client completion of daily ADLs for client  Patient Self Care Activities:  . Attends all scheduled provider appointments . Performs ADL's independently . Calls provider office for new concerns or questions   Plan: Client/spouse to talk with RN CM to discuss nursing needs of client LCSW to call client/spouse in next 3 weeks to discuss client management of anxiety symptoms Client to attend scheduled client medical appointments Client to use relaxation techniques to help manage anxiety symptoms.  Initial goal documentation          Materials Provided: No  Follow Up Plan: LCSW to call client /spouse of client in next 3 weeks to discuss client  management of anxiety symptoms  The patient /[name], spouse, verbalized understanding of instructions provided today and declined a print copy of patient instruction materials.   [name] MSW, LCSW Licensed Clinical Social Worker [facility] [phone]

## 2019-05-18 NOTE — Chronic Care Management (AMB) (Signed)
  Care Management Note   [name] is a 83 y.o. year old male who is a primary care patient of [name], Connye Burkitt, [name]. The CM team was consulted for assistance with chronic disease management and care coordination.   I reached out to [employer], spouse, by phone today.   Review of patient status, including review of consultants reports, relevant laboratory and other test results, and collaboration with appropriate care team members and the patient's provider was performed as part of comprehensive patient evaluation and provision of chronic care management services.   Social Determinants of Health: risk of social isolation; risk of physical inactivity    Chronic Care Management from 10/07/2018 in [facility]  PHQ-9 Total Score  7     GAD 7 : Generalized Anxiety Score 10/07/2018  Nervous, Anxious, on Edge 1  Control/stop worrying 0  Worry too much - different things 1  Trouble relaxing 1  Restless 1  Easily annoyed or irritable 0  Afraid - awful might happen 0  Total GAD 7 Score 4  Anxiety Difficulty Somewhat difficult   Medications   New medications from outside sources are available for reconciliation   ACCU-CHEK SOFTCLIX LANCETS lancets    albuterol (PROVENTIL HFA;VENTOLIN HFA) 108 (90 Base) MCG/ACT inhaler    albuterol (PROVENTIL) (2.5 MG/3ML) 0.083% nebulizer solution    aspirin EC 81 MG tablet    atorvastatin (LIPITOR) 40 MG tablet    Blood Glucose Calibration (ACCU-CHEK AVIVA) SOLN    blood glucose meter kit and supplies KIT    Blood Glucose Monitoring Suppl (ACCU-CHEK AVIVA PLUS) w/Device KIT    donepezil ([name]) 5 MG tablet(Expired)    fluticasone furoate-vilanterol (BREO ELLIPTA) 100-25 MCG/INH AEPB    glucose blood (ACCU-CHEK AVIVA PLUS) test strip    Insulin Pen Needle (PEN NEEDLES) 32G X 4 MM MISC    losartan (COZAAR) 50 MG tablet    memantine (NAMENDA) 10 MG tablet    Nebulizers (COMP AIR COMPRESSOR NEBULIZER) MISC    TRESIBA FLEXTOUCH 100 UNIT/ML SOPN FlexTouch Pen      Goals        . Client wants to talk with someone about anxiety or depression management (pt-stated)     Current Barriers:  [name] Kitchen Medical issues management . Anxiety issues  Clinical Social Work Clinical Goal(s):  Over the next 30 days, client/spouse will work with LCSW to address concerns related to management of anxiety symptoms of client. Interventions: . Talked with client/spouse about client use of relaxation techniques (taking walk,working in yard,being outdoors) . Talked with client/spouse about family support. . Encouraged client/spouse to talk with RN CM [name] about nursing needs of client . Talked with client/spouse about client completion of daily ADLs for client  Patient Self Care Activities:  . Attends all scheduled provider appointments . Performs ADL's independently . Calls provider office for new concerns or questions   Plan: Client/spouse to talk with RN CM to discuss nursing needs of client LCSW to call client/spouse in next 3 weeks to discuss client management of anxiety symptoms Client to attend scheduled client medical appointments Client to use relaxation techniques to help manage anxiety symptoms.  Initial goal documentation     Follow Up Plan: LCSW to call client or spouse of client in next 3 weeks to discuss client management of anxiety symptoms  [name]. MSW, LCSW Licensed Clinical Social Worker [facility] [phone]

## 2019-05-22 ENCOUNTER — Encounter: Payer: Self-pay | Admitting: Family Medicine

## 2019-05-22 ENCOUNTER — Other Ambulatory Visit: Payer: Self-pay

## 2019-05-22 ENCOUNTER — Ambulatory Visit (INDEPENDENT_AMBULATORY_CARE_PROVIDER_SITE_OTHER): Payer: Medicare HMO | Admitting: Family Medicine

## 2019-05-22 VITALS — BP 121/78 | HR 79 | Temp 98.9°F | Resp 20 | Ht 72.0 in | Wt 200.0 lb

## 2019-05-22 DIAGNOSIS — M25561 Pain in right knee: Secondary | ICD-10-CM

## 2019-05-22 DIAGNOSIS — J449 Chronic obstructive pulmonary disease, unspecified: Secondary | ICD-10-CM | POA: Diagnosis not present

## 2019-05-22 MED ORDER — DICLOFENAC SODIUM 1 % EX GEL: 2.0000 g | Freq: Four times a day (QID) | CUTANEOUS | 1 refills | Status: DC

## 2019-05-22 MED ORDER — PREDNISONE 20 MG PO TABS: ORAL_TABLET | ORAL | 0 refills | Status: DC

## 2019-05-22 NOTE — Patient Instructions (Signed)

## 2019-05-23 NOTE — Progress Notes (Signed)
Subjective:  Patient ID: [name], male    DOB: [birth year], 83 y.o.   MRN: [id number]  Patient Care Team: Baruch Gouty, [name] as PCP - General (Family Medicine) Shea Evans, [name], LCSW as Social Worker (Licensed Clinical Social Worker) Ilean China, RN as Case Manager Collene Gobble, MD as Consulting Physician (Pulmonary Disease)   Chief Complaint:  right leg pain (right knee area pain x 2-3 knee)   HPI: [name] is a 83 y.o. male presenting on 05/22/2019 for right leg pain (right knee area pain x 2-3 knee)   Knee Pain  The incident occurred 3 to 5 days ago. The incident occurred at home. There was no injury mechanism. The pain is present in the right knee. The quality of the pain is described as aching. The pain is at a severity of 3/10. The pain is mild. The pain has been intermittent since onset. Pertinent negatives include no inability to bear weight, loss of motion, loss of sensation, muscle weakness, numbness or tingling. He reports no foreign bodies present. The symptoms are aggravated by movement and weight bearing. Treatments tried: [employer]. The treatment provided no relief.    Relevant past medical, surgical, family, and social history reviewed and updated as indicated.  Allergies and medications reviewed and updated. Date reviewed: Chart in Epic.   Past Medical History:  Diagnosis Date  . Anxiety   . Asthma   . Choledocholithiasis 02/24/2013  . COPD (chronic obstructive pulmonary disease) ([place])   . CVA (cerebral vascular accident) ([place]) 2013   loss of memory  . Dementia ([place])   . Diabetes mellitus   . Hx-TIA (transient ischemic attack)   . Hyperlipidemia   . Hypertension     Past Surgical History:  Procedure Laterality Date  . CATARACT EXTRACTION W/PHACO Left 04/30/2016   Procedure: CATARACT EXTRACTION PHACO AND INTRAOCULAR LENS PLACEMENT LEFT EYE CDE=9.83;  Surgeon: Tonny Branch, MD;  Location: [facility];  Service: Ophthalmology;  Laterality: Left;   left  . CHOLECYSTECTOMY N/A 02/23/2013   Procedure: LAPAROSCOPIC CHOLECYSTECTOMY;  Surgeon: Jamesetta So, MD;  Location: [facility];  Service: General;  Laterality: N/A;  . ERCP N/A 02/24/2013   Procedure: ENDOSCOPIC RETROGRADE CHOLANGIOPANCREATOGRAPHY;  Surgeon: Daneil Dolin, MD;  Location: [facility];  Service: Endoscopy;  Laterality: N/A;  . ROTATOR CUFF REPAIR Right     Social History   Socioeconomic History  . Marital status: Married    Spouse name: [name]  . Number of children: 6  . Years of education: 11th  . Highest education level: 11th grade  Occupational History  . Occupation: Retired    Comment: security - Marine scientist   Social Needs  . Financial resource strain: Not very hard  . Food insecurity    Worry: Never true    Inability: Never true  . Transportation needs    Medical: No    Non-medical: No  Tobacco Use  . Smoking status: Never Smoker  . Smokeless tobacco: Never Used  Substance and Sexual Activity  . Alcohol use: No  . Drug use: No  . Sexual activity: Not Currently  Lifestyle  . Physical activity    Days per week: 0 days    Minutes per session: 0 min  . Stress: Only a little  Relationships  . Social connections    Talks on phone: More than three times a week    Gets together: More than three times a week  Attends religious service: 1 to 4 times per year    Active member of club or organization: No    Attends meetings of clubs or organizations: Never    Relationship status: Married  . Intimate partner violence    Fear of current or ex partner: No    Emotionally abused: No    Physically abused: No    Forced sexual activity: No  Other Topics Concern  . Not on file  Social History Narrative   Patient lives at home with wife, [name] = oldest daughter lives there also    Caffeine Use: 1 cup of coffee and 2 can pepsi    Outpatient Encounter Medications as of 05/22/2019  Medication Sig  . ACCU-CHEK SOFTCLIX LANCETS lancets Check glucose twice a day   . albuterol (PROVENTIL HFA;VENTOLIN HFA) 108 (90 Base) MCG/ACT inhaler Inhale 2 puffs into the lungs every 4 (four) hours as needed for wheezing or shortness of breath.  [name] Kitchen albuterol (PROVENTIL) (2.5 MG/3ML) 0.083% nebulizer solution Take 3 mLs (2.5 mg total) by nebulization every 6 (six) hours as needed for wheezing or shortness of breath.  [name] Kitchen aspirin EC 81 MG tablet Take 162 mg by mouth daily.   [name] Kitchen atorvastatin (LIPITOR) 40 MG tablet Take 1 tablet (40 mg total) by mouth daily.  . Blood Glucose Calibration (ACCU-CHEK AVIVA) SOLN Use with glucometer  . blood glucose meter kit and supplies KIT Daily. Onetouch Verio  . Blood Glucose Monitoring Suppl (ACCU-CHEK AVIVA PLUS) w/Device KIT Check glucose twice a day  . fluticasone furoate-vilanterol (BREO ELLIPTA) 100-25 MCG/INH AEPB Inhale 1 puff into the lungs daily.  [name] Kitchen glucose blood (ACCU-CHEK AVIVA PLUS) test strip Check glucose twice a day  . Insulin Pen Needle (PEN NEEDLES) 32G X 4 MM MISC 1 each by Does not apply route 2 (two) times daily.  [name] Kitchen losartan (COZAAR) 50 MG tablet Take 1 tablet (50 mg total) by mouth daily.  . memantine (NAMENDA) 10 MG tablet TAKE 1 TABLET TWICE DAILY  . Nebulizers (COMP AIR COMPRESSOR NEBULIZER) MISC   . TRESIBA FLEXTOUCH 100 UNIT/ML SOPN FlexTouch Pen Inject 0.16 mLs (16 Units total) into the skin daily at 10 pm.  . diclofenac Sodium (VOLTAREN) 1 % GEL Apply 2 g topically 4 (four) times daily.  [name] Kitchen donepezil ([name]) 5 MG tablet Take 1 tablet (5 mg total) by mouth at bedtime for 30 days.  . predniSONE (DELTASONE) 20 MG tablet 2 po at sametime daily for 5 days   No facility-administered encounter medications on file as of 05/22/2019.     Allergies  Allergen Reactions  . Ace Inhibitors Swelling    Review of Systems  Constitutional: Negative for activity change, appetite change, chills, fatigue and fever.  HENT: Negative.   Eyes: Negative.   Respiratory: Negative for cough, chest tightness and shortness of breath.    Cardiovascular: Negative for chest pain, palpitations and leg swelling.  Gastrointestinal: Negative for blood in stool, constipation, diarrhea, nausea and vomiting.  Endocrine: Negative.   Genitourinary: Negative for decreased urine volume, difficulty urinating, dysuria, frequency and urgency.  Musculoskeletal: Positive for arthralgias (right knee). Negative for back pain, joint swelling and myalgias.  Skin: Negative.   Allergic/Immunologic: Negative.   Neurological: Negative for dizziness, tingling, numbness and headaches.  Hematological: Negative.   Psychiatric/Behavioral: Positive for confusion (baseline). Negative for hallucinations, sleep disturbance and suicidal ideas.  All other systems reviewed and are negative.       Objective:  BP 121/78   Pulse 79  Temp 98.9 F (37.2 C)   Resp 20   Ht 6' (1.829 m)   Wt 200 lb (90.7 kg)   SpO2 98%   BMI 27.12 kg/m    Wt Readings from Last 3 Encounters:  05/22/19 200 lb (90.7 kg)  12/26/18 212 lb (96.2 kg)  11/27/18 212 lb (96.2 kg)    Physical Exam Vitals signs and nursing note reviewed.  Constitutional:      General: He is not in acute distress.    Appearance: Normal appearance. He is well-developed and well-groomed. He is not ill-appearing, toxic-appearing or diaphoretic.  HENT:     Head: Normocephalic and atraumatic.     Jaw: There is normal jaw occlusion.     Right Ear: Hearing normal.     Left Ear: Hearing normal.     Nose: Nose normal.     Mouth/Throat:     Lips: Pink.     Mouth: Mucous membranes are moist.     Pharynx: Oropharynx is clear. Uvula midline.  Eyes:     General: Lids are normal.     Extraocular Movements: Extraocular movements intact.     Conjunctiva/sclera: Conjunctivae normal.     Pupils: Pupils are equal, round, and reactive to light.  Neck:     Musculoskeletal: Normal range of motion and neck supple.     Thyroid: No thyroid mass, thyromegaly or thyroid tenderness.     Vascular: No carotid  bruit or JVD.     Trachea: Trachea and phonation normal.  Cardiovascular:     Rate and Rhythm: Normal rate and regular rhythm.     Chest Wall: PMI is not displaced.     Pulses: Normal pulses.     Heart sounds: Normal heart sounds. No murmur. No friction rub. No gallop.   Pulmonary:     Effort: Pulmonary effort is normal. No respiratory distress.     Breath sounds: Normal breath sounds. No wheezing.  Abdominal:     General: Bowel sounds are normal. There is no distension or abdominal bruit.     Palpations: Abdomen is soft. There is no hepatomegaly or splenomegaly.     Tenderness: There is no abdominal tenderness. There is no right CVA tenderness or left CVA tenderness.     Hernia: No hernia is present.  Musculoskeletal: Normal range of motion.     Right hip: Normal.     Right knee: He exhibits normal range of motion, no swelling, no effusion, no ecchymosis, no deformity, no laceration, no erythema, normal alignment, no LCL laxity, normal patellar mobility, no bony tenderness, normal meniscus and no MCL laxity. Tenderness found. No medial joint line, no lateral joint line, no MCL, no LCL and no patellar tendon tenderness noted.     Right ankle: Normal.     Right upper leg: Normal.     Right lower leg: No edema.     Left lower leg: No edema.     Comments: Right knee ROM intact without discomfort. Negative anterior drawer sign. No flexion or extension. Gait normal. No pain with weight bearing.   Lymphadenopathy:     Cervical: No cervical adenopathy.  Skin:    General: Skin is warm and dry.     Capillary Refill: Capillary refill takes less than 2 seconds.     Coloration: Skin is not cyanotic, jaundiced or pale.     Findings: No rash.  Neurological:     General: No focal deficit present.     Mental Status: He is alert.  Mental status is at baseline.     Cranial Nerves: Cranial nerves are intact. No cranial nerve deficit.     Sensory: Sensation is intact. No sensory deficit.     Motor:  Motor function is intact. No weakness.     Coordination: Coordination is intact. Coordination normal.     Gait: Gait is intact. Gait normal.     Deep Tendon Reflexes: Reflexes are normal and symmetric. Reflexes normal.  Psychiatric:        Attention and Perception: Attention and perception normal.        Mood and Affect: Mood and affect normal.        Speech: Speech normal.        Behavior: Behavior normal. Behavior is cooperative.        Thought Content: Thought content normal.        Cognition and Memory: Cognition is impaired. Memory is impaired.        Judgment: Judgment normal.     Results for orders placed or performed in visit on 11/27/18  CMP14+EGFR  Result Value Ref Range   Glucose 159 (H) 65 - 99 mg/dL   BUN 11 8 - 27 mg/dL   Creatinine, Ser 1.24 0.76 - 1.27 mg/dL   GFR calc non Af Amer 55 (L) >59 mL/min/1.73   GFR calc Af Amer 63 >59 mL/min/1.73   BUN/Creatinine Ratio 9 (L) 10 - 24   Sodium 141 134 - 144 mmol/L   Potassium 3.8 3.5 - 5.2 mmol/L   Chloride 106 96 - 106 mmol/L   CO2 20 20 - 29 mmol/L   Calcium 8.5 (L) 8.6 - 10.2 mg/dL   Total Protein 6.9 6.0 - 8.5 g/dL   Albumin 4.3 3.7 - 4.7 g/dL   Globulin, Total 2.6 1.5 - 4.5 g/dL   Albumin/Globulin Ratio 1.7 1.2 - 2.2   Bilirubin Total 0.5 0.0 - 1.2 mg/dL   Alkaline Phosphatase 111 39 - 117 IU/L   AST 25 0 - 40 IU/L   ALT 22 0 - 44 IU/L  CBC with Differential/Platelet  Result Value Ref Range   WBC 6.6 3.4 - 10.8 x10E3/uL   RBC 4.28 4.14 - 5.80 x10E6/uL   Hemoglobin 13.2 13.0 - 17.7 g/dL   Hematocrit 37.2 (L) 37.5 - 51.0 %   MCV 87 79 - 97 fL   MCH 30.8 26.6 - 33.0 pg   MCHC 35.5 31.5 - 35.7 g/dL   RDW 11.9 11.6 - 15.4 %   Platelets 182 150 - 450 x10E3/uL   Neutrophils 62 Not Estab. %   Lymphs 27 Not Estab. %   Monocytes 6 Not Estab. %   Eos 3 Not Estab. %   Basos 1 Not Estab. %   Neutrophils Absolute 4.1 1.4 - 7.0 x10E3/uL   Lymphocytes Absolute 1.8 0.7 - 3.1 x10E3/uL   Monocytes Absolute 0.4 0.1 -  0.9 x10E3/uL   EOS (ABSOLUTE) 0.2 0.0 - 0.4 x10E3/uL   Basophils Absolute 0.1 0.0 - 0.2 x10E3/uL   Immature Granulocytes 1 Not Estab. %   Immature Grans (Abs) 0.0 0.0 - 0.1 x10E3/uL  Lipid panel  Result Value Ref Range   Cholesterol, Total 85 (L) 100 - 199 mg/dL   Triglycerides 130 0 - 149 mg/dL   HDL 41 >39 mg/dL   VLDL Cholesterol Cal 26 5 - 40 mg/dL   LDL Calculated 18 0 - 99 mg/dL   Chol/HDL Ratio 2.1 0.0 - 5.0 ratio  TSH  Result Value Ref Range  TSH 2.530 0.450 - 4.500 uIU/mL  Bayer DCA Hb A1c Waived  Result Value Ref Range   HB A1C (BAYER DCA - WAIVED) 7.6 (H) <7.0 %       Pertinent labs & imaging results that were available during my care of the patient were reviewed by me and considered in my medical decision making.  Assessment & Plan:  Earnie was seen today for right leg pain.  Diagnoses and all orders for this visit:  Acute pain of right knee Acute pain of right knee without associated injury. Pain worse with weight bearing. Will treat symptomatically. Declines PT. Will initiate below, prednisone instead of NSAID due to renal function. Topical Voltaren Gel. Report any new, worsening, or persistent symptoms. Follow up in 4 weeks or sooner if needed.  -     diclofenac Sodium (VOLTAREN) 1 % GEL; Apply 2 g topically 4 (four) times daily. -     predniSONE (DELTASONE) 20 MG tablet; 2 po at sametime daily for 5 days     Continue all other maintenance medications.  Follow up plan: Return in about 4 weeks (around 06/19/2019), or if symptoms worsen or fail to improve, for Knee pain.  Continue healthy lifestyle choices, including diet (rich in fruits, vegetables, and lean proteins, and low in salt and simple carbohydrates) and exercise (at least 30 minutes of moderate physical activity daily).  Educational handout given for knee pain   The above assessment and management plan was discussed with the patient. The patient verbalized understanding of and has agreed to the  management plan. Patient is aware to call the clinic if they develop any new symptoms or if symptoms persist or worsen. Patient is aware when to return to the clinic for a follow-up visit. Patient educated on when it is appropriate to go to the emergency department.   Monia Pouch, [name]-C [facility] [phone]

## 2019-06-12 ENCOUNTER — Ambulatory Visit (INDEPENDENT_AMBULATORY_CARE_PROVIDER_SITE_OTHER): Payer: Medicare HMO | Admitting: Licensed Clinical Social Worker

## 2019-06-12 DIAGNOSIS — I10 Essential (primary) hypertension: Secondary | ICD-10-CM | POA: Diagnosis not present

## 2019-06-12 DIAGNOSIS — E1159 Type 2 diabetes mellitus with other circulatory complications: Secondary | ICD-10-CM | POA: Diagnosis not present

## 2019-06-12 DIAGNOSIS — I152 Hypertension secondary to endocrine disorders: Secondary | ICD-10-CM

## 2019-06-12 DIAGNOSIS — E785 Hyperlipidemia, unspecified: Secondary | ICD-10-CM

## 2019-06-12 DIAGNOSIS — Z794 Long term (current) use of insulin: Secondary | ICD-10-CM

## 2019-06-12 DIAGNOSIS — F0390 Unspecified dementia without behavioral disturbance: Secondary | ICD-10-CM | POA: Diagnosis not present

## 2019-06-12 DIAGNOSIS — E1169 Type 2 diabetes mellitus with other specified complication: Secondary | ICD-10-CM | POA: Diagnosis not present

## 2019-06-12 DIAGNOSIS — Z8673 Personal history of transient ischemic attack (TIA), and cerebral infarction without residual deficits: Secondary | ICD-10-CM

## 2019-06-12 NOTE — Patient Instructions (Signed)
Licensed Clinical Social Worker Visit Information  Goals we discussed today:  Goals    . Client wants to talk with someone about anxiety or depression management (pt-stated)     Current Barriers:  [name] Kitchen Medical issues management . Anxiety issues  Clinical Social Work Clinical Goal(s):  Over the next 30 days, client/spouse will work with LCSW to address concerns related to management of anxiety symptoms of client. Interventions: . Previously talked with client/spouse about client use of relaxation techniques (taking walk,working in yard,being outdoors) . Previously talked with client/spouse about family support. . Previously encouraged client/spouse to talk with RN CM [name] about nursing needs of client . Previously talked with client/spouse about client completion of daily ADLs for client  Patient Self Care Activities:  . Attends all scheduled provider appointments . Performs ADL's independently . Calls provider office for new concerns or questions   Plan: Client/spouse to talk with RN CM to discuss nursing needs of client LCSW to call client/spouse in next 3 weeks to discuss client management of anxiety symptoms Client to attend scheduled client medical appointments Client to use relaxation techniques to help manage anxiety symptoms.  Initial goal documentation       Materials Provided: No  Follow Up Plan: LCSW to call client/spouse in next 3 weeks to discuss client  management of anxiety symptoms faced  The patient/spouse verbalized understanding of instructions provided today and declined a print copy of patient instruction materials.   [name].Icel Castles MSW, LCSW Licensed Clinical Social Worker [facility] [phone]

## 2019-06-12 NOTE — Chronic Care Management (AMB) (Signed)
Care Management Note   [name] is a 83 y.o. year old male who is a primary care patient of [name], Connye Burkitt, [name]. The CM team was consulted for assistance with chronic disease management and care coordination.   I reached out to [place] by phone today.     Review of patient status, including review of consultants reports, relevant laboratory and other test results, and collaboration with appropriate care team members and the patient's provider was performed as part of comprehensive patient evaluation and provision of chronic care management services.   Social determinants of health: risk of social isolation; risk of physical inactivity    Chronic Care Management from 10/07/2018 in [facility]  PHQ-9 Total Score  7     GAD 7 : Generalized Anxiety Score 10/07/2018  Nervous, Anxious, on Edge 1  Control/stop worrying 0  Worry too much - different things 1  Trouble relaxing 1  Restless 1  Easily annoyed or irritable 0  Afraid - awful might happen 0  Total GAD 7 Score 4  Anxiety Difficulty Somewhat difficult   Medications    ACCU-CHEK SOFTCLIX LANCETS lancets    albuterol (PROVENTIL HFA;VENTOLIN HFA) 108 (90 Base) MCG/ACT inhaler    albuterol (PROVENTIL) (2.5 MG/3ML) 0.083% nebulizer solution    aspirin EC 81 MG tablet    atorvastatin (LIPITOR) 40 MG tablet    Blood Glucose Calibration (ACCU-CHEK AVIVA) SOLN    blood glucose meter kit and supplies KIT    Blood Glucose Monitoring Suppl (ACCU-CHEK AVIVA PLUS) w/Device KIT    diclofenac Sodium (VOLTAREN) 1 % GEL    donepezil ([name]) 5 MG tablet(Expired)    fluticasone furoate-vilanterol (BREO ELLIPTA) 100-25 MCG/INH AEPB    glucose blood (ACCU-CHEK AVIVA PLUS) test strip    Insulin Pen Needle (PEN NEEDLES) 32G X 4 MM MISC    losartan (COZAAR) 50 MG tablet    memantine (NAMENDA) 10 MG tablet    Nebulizers (COMP AIR COMPRESSOR NEBULIZER) MISC    predniSONE (DELTASONE) 20 MG tablet    TRESIBA FLEXTOUCH 100 UNIT/ML SOPN FlexTouch Pen    Goals        . Client wants to talk with someone about anxiety or depression management (pt-stated)     Current Barriers:  . Anxiety issues related to client's chronic illnesses of HTN; Type 2 DM, Dementia, Hx TIA; Hyperlipidemia  Clinical Social Work Clinical Goal(s):  Over the next 30 days, client/spouse will work with LCSW to address concerns related to management of anxiety symptoms of client. Interventions: . Previously talked with client/spouse about client use of relaxation techniques (taking walk,working in yard,being outdoors) . Previously talked with client/spouse about family support. . Previously encouraged client/spouse to talk with RN CM [name] about nursing needs of client . Previously talked with client/spouse about client completion of daily ADLs for client  Patient Self Care Activities:  . Attends all scheduled provider appointments . Performs ADL's independently . Calls provider office for new concerns or questions   Plan: Client/spouse to talk with RN CM to discuss nursing needs of client LCSW to call client/spouse in next 3 weeks to discuss client management of anxiety symptoms Client to attend scheduled client medical appointments Client to use relaxation techniques to help manage anxiety symptoms.  Initial goal documentation      Follow Up Plan: LCSW to call client/spouse in next 3 weeks to talk with client/spouse about client's management of anxiety symptoms  [name].Alura Olveda MSW,  LCSW Licensed Clinical Social Worker [facility] [phone]

## 2019-06-26 ENCOUNTER — Telehealth: Payer: Self-pay

## 2019-07-14 DIAGNOSIS — H35372 Puckering of macula, left eye: Secondary | ICD-10-CM | POA: Diagnosis not present

## 2019-07-14 DIAGNOSIS — H11041 Peripheral pterygium, stationary, right eye: Secondary | ICD-10-CM | POA: Diagnosis not present

## 2019-07-14 DIAGNOSIS — H25811 Combined forms of age-related cataract, right eye: Secondary | ICD-10-CM | POA: Diagnosis not present

## 2019-07-14 DIAGNOSIS — Z961 Presence of intraocular lens: Secondary | ICD-10-CM | POA: Diagnosis not present

## 2019-07-14 DIAGNOSIS — H16223 Keratoconjunctivitis sicca, not specified as Sjogren's, bilateral: Secondary | ICD-10-CM | POA: Diagnosis not present

## 2019-07-14 DIAGNOSIS — E113293 Type 2 diabetes mellitus with mild nonproliferative diabetic retinopathy without macular edema, bilateral: Secondary | ICD-10-CM | POA: Diagnosis not present

## 2019-07-14 DIAGNOSIS — Z794 Long term (current) use of insulin: Secondary | ICD-10-CM | POA: Diagnosis not present

## 2019-08-07 ENCOUNTER — Other Ambulatory Visit: Payer: Self-pay | Admitting: Family Medicine

## 2019-08-07 DIAGNOSIS — E1129 Type 2 diabetes mellitus with other diabetic kidney complication: Secondary | ICD-10-CM

## 2019-08-07 DIAGNOSIS — Z794 Long term (current) use of insulin: Secondary | ICD-10-CM

## 2019-08-20 ENCOUNTER — Telehealth: Payer: Self-pay | Admitting: Family Medicine

## 2019-08-20 NOTE — Chronic Care Management (AMB) (Signed)
  Care Management   Note  08/20/2019 Name: [name] MRN: [id number] DOB: June 04, 1936  [name] is a 84 y.o. year old male who is a primary care patient of [name], Connye Burkitt, [name] and is actively engaged with the care management team. I reached out to [name] by phone today to assist with scheduling a follow up visit with the Licensed Clinical Social Worker  Follow up plan: Unsuccessful telephone outreach attempt made. A HIPPA compliant phone message was left for the patient providing contact information and requesting a return call.  The care management team will reach out to the patient again over the next 7 days.  If patient returns call to provider office, please advise to call [facility]  at [place] [address], [place] [address], [place], [place] [address] Direct Dial: [phone] [email or web address] Website: [email or web address]

## 2019-08-24 NOTE — Chronic Care Management (AMB) (Signed)
  Care Management   Note  08/24/2019 Name: [name] MRN: [id number] DOB: 11/11/1935  [name] is a 84 y.o. year old male who is a primary care patient of [name], Connye Burkitt, [name] and is actively engaged with the care management team. I reached out to [name] by phone today and spoke with his wife [name] to assist with re-scheduling a follow up visit with the Licensed Clinical Social Worker  Follow up plan: Telephone appointment with care management team member scheduled for: 09/04/2019  Noreene Larsson, [place], [place], [place] [address] Direct Dial: [phone] [email or web address] Website: [email or web address]

## 2019-09-04 ENCOUNTER — Ambulatory Visit (INDEPENDENT_AMBULATORY_CARE_PROVIDER_SITE_OTHER): Payer: Medicare HMO | Admitting: Licensed Clinical Social Worker

## 2019-09-04 DIAGNOSIS — E785 Hyperlipidemia, unspecified: Secondary | ICD-10-CM

## 2019-09-04 DIAGNOSIS — E1169 Type 2 diabetes mellitus with other specified complication: Secondary | ICD-10-CM | POA: Diagnosis not present

## 2019-09-04 DIAGNOSIS — E1159 Type 2 diabetes mellitus with other circulatory complications: Secondary | ICD-10-CM

## 2019-09-04 DIAGNOSIS — Z794 Long term (current) use of insulin: Secondary | ICD-10-CM | POA: Diagnosis not present

## 2019-09-04 DIAGNOSIS — F0390 Unspecified dementia without behavioral disturbance: Secondary | ICD-10-CM

## 2019-09-04 DIAGNOSIS — Z8673 Personal history of transient ischemic attack (TIA), and cerebral infarction without residual deficits: Secondary | ICD-10-CM

## 2019-09-04 DIAGNOSIS — I10 Essential (primary) hypertension: Secondary | ICD-10-CM | POA: Diagnosis not present

## 2019-09-04 NOTE — Patient Instructions (Addendum)
Licensed Clinical Social Worker Visit Information  Goals we discussed today:  Goals Addressed            This Visit's Progress   . Client wants to talk with someone about anxiety or depression management (pt-stated)       Current Barriers:  [name] Kitchen Medical issues management in client with Chronic diagnoses of HLD, DM, HTN, Hx TIA, DM, Dementia . Anxiety issues  Clinical Social Work Clinical Goal(s):  Over the next 30 days, client/spouse will work with LCSW to address concerns related to management of anxiety symptoms of client.  Interventions: Previously talked with client/spouse about client use of relaxation techniques (taking walk,working in yard,being outdoors)  Talked with client/spouse about family support.  Encouraged that client/spouse talk with RN CM [name] about nursing needs of client  Talked with client/spouse about client completion of daily ADLs  Talked with [name] about pain issues of client  Talked with [name] about [facility] program in [place], [place] and encouraged              [name] to talk with [name] about [facility] program activities   Patient Self Care Activities:  . Attends all scheduled provider appointments . Performs ADL's independently . Calls provider office for new concerns or questions   Plan: Client/spouse to talk with RN CM to discuss nursing needs of client LCSW to call client/spouse in next 4 weeks to discuss client management of anxiety symptoms Client to attend scheduled client medical appointments Client to use relaxation techniques to help manage anxiety symptoms.  Initial goal documentation       Materials Provided: No  Follow Up Plan: LCSW to call client/spouse in next 4 weeks to discuss client management of anxiety symptoms faced   The patient/[name], spouse, verbalized understanding of instructions provided today and declined a print copy of patient instruction materials.   [name].Alisson Rozell MSW,  LCSW Licensed Clinical Social Worker [facility] Care Management [phone]

## 2019-09-04 NOTE — Chronic Care Management (AMB) (Signed)
Care Management Note   [name] is a 84 y.o. year old male who is a primary care patient of [name], Connye Burkitt, [name]. The CM team was consulted for assistance with chronic disease management and care coordination.   I reached out to [employer], spouse, by phone today.    Review of patient status, including review of consultants reports, relevant laboratory and other test results, and collaboration with appropriate care team members and the patient's provider was performed as part of comprehensive patient evaluation and provision of chronic care management services.   Social determinants of health:risk of social isolation; risk of depression; risk of physical inactivity    Chronic Care Management from 10/07/2018 in [place]  PHQ-9 Total Score  7     GAD 7 : Generalized Anxiety Score 10/07/2018  Nervous, Anxious, on Edge 1  Control/stop worrying 0  Worry too much - different things 1  Trouble relaxing 1  Restless 1  Easily annoyed or irritable 0  Afraid - awful might happen 0  Total GAD 7 Score 4  Anxiety Difficulty Somewhat difficult   Medications   ACCU-CHEK SOFTCLIX LANCETS lancets albuterol (PROVENTIL HFA;VENTOLIN HFA) 108 (90 Base) MCG/ACT inhaler albuterol (PROVENTIL) (2.5 MG/3ML) 0.083% nebulizer solution aspirin EC 81 MG tablet atorvastatin (LIPITOR) 40 MG tablet Blood Glucose Calibration (ACCU-CHEK AVIVA) SOLN blood glucose meter kit and supplies KIT Blood Glucose Monitoring Suppl (ACCU-CHEK AVIVA PLUS) w/Device KIT diclofenac Sodium (VOLTAREN) 1 % GEL donepezil ([name]) 5 MG tablet(Expired) fluticasone furoate-vilanterol (BREO ELLIPTA) 100-25 MCG/INH AEPB glucose blood (ACCU-CHEK AVIVA PLUS) test strip Insulin Pen Needle (PEN NEEDLES) 32G X 4 MM MISC losartan (COZAAR) 50 MG tablet memantine (NAMENDA) 10 MG tablet Nebulizers (COMP AIR COMPRESSOR NEBULIZER) MISC predniSONE (DELTASONE) 20 MG tablet TRESIBA FLEXTOUCH 100  UNIT/ML SOPN FlexTouch Pen  Goals Addressed            This Visit's Progress   . Client wants to talk with someone about anxiety or depression management (pt-stated)       Current Barriers:  [name] Kitchen Medical issues management in client with Chronic diagnoses of HLD, DM, HTN, Hx TIA, DM, Dementia . Anxiety issues  Clinical Social Work Clinical Goal(s):  Over the next 30 days, client/spouse will work with LCSW to address concerns related to management of anxiety symptoms of client. Interventions:  Previously talked with client/spouse about client use of relaxation techniques (taking walk,working in yard,being outdoors)  Talked with client/spouse about family support.  Encouraged that client/spouse talk with RN CM [name] about nursing needs of client  Talked with client/spouse about client completion of daily ADLs   Talked with [name] about pain issues of client  Talked with [name] about [facility] program in [place], [place] and encouraged       [name] to talk with [name] about [facility] program activities  Patient Self Care Activities:  . Attends all scheduled provider appointments . Performs ADL's independently . Calls provider office for new concerns or questions   Plan: Client/spouse to talk with RN CM to discuss nursing needs of client LCSW to call client/spouse in next 4 weeks to discuss client management of anxiety symptoms Client to attend scheduled client medical appointments Client to use relaxation techniques to help manage anxiety symptoms.  Initial goal documentation     Follow Up Plan: LCSW to call client/spouse in next 4 weeks to discuss client management of anxiety symptoms  [name].Amee Boothe MSW, LCSW Licensed Clinical Social  Worker [facility] Care Management [phone]

## 2019-09-30 ENCOUNTER — Other Ambulatory Visit: Payer: Self-pay | Admitting: *Deleted

## 2019-09-30 DIAGNOSIS — F0150 Vascular dementia without behavioral disturbance: Secondary | ICD-10-CM

## 2019-09-30 MED ORDER — MEMANTINE HCL 10 MG PO TABS: 10.0000 mg | ORAL_TABLET | Freq: Two times a day (BID) | ORAL | 3 refills | Status: DC

## 2019-10-02 ENCOUNTER — Ambulatory Visit: Payer: Self-pay | Admitting: Licensed Clinical Social Worker

## 2019-10-02 DIAGNOSIS — E1159 Type 2 diabetes mellitus with other circulatory complications: Secondary | ICD-10-CM

## 2019-10-02 DIAGNOSIS — F0390 Unspecified dementia without behavioral disturbance: Secondary | ICD-10-CM

## 2019-10-02 DIAGNOSIS — Z8673 Personal history of transient ischemic attack (TIA), and cerebral infarction without residual deficits: Secondary | ICD-10-CM

## 2019-10-02 DIAGNOSIS — Z794 Long term (current) use of insulin: Secondary | ICD-10-CM

## 2019-10-02 DIAGNOSIS — E1169 Type 2 diabetes mellitus with other specified complication: Secondary | ICD-10-CM

## 2019-10-02 NOTE — Patient Instructions (Addendum)
Licensed Clinical Social Worker Visit Information  Goals we discussed today:    Goals    . Client wants to talk with someone about anxiety or depression management (pt-stated)     Current Barriers:  [name] Kitchen Medical issues management in client with Chronic diagnoses of HLD, DM, HTN, Hx TIA, DM, Dementia . Anxiety issues  Clinical Social Work Clinical Goal(s):  Over the next 30 days, client/spouse will work with LCSW to address concerns related to management of anxiety symptoms of client.  Interventions: Previously talked with client/spouse about client use of relaxation techniques (taking walk,working in yard,being outdoors)  Previously talked with client/spouse about family support.  Previously encouraged that client/spouse talk with RN CM [name] about nursing needs of client  Talked with client/spouse previously about client completion of daily ADLs  Previously talked with [name] about pain issues of client  Previously talked with [name] about [facility] program in [place], [place] and encouraged [name] to talk with [name] about [facility] program activities  Patient Self Care Activities:  . Attends all scheduled provider appointments . Performs ADL's independently . Calls provider office for new concerns or questions   Plan: Client/spouse to talk with RN CM to discuss nursing needs of client LCSW to call client/spouse in next 4 weeks to discuss client management of anxiety symptoms Client to attend scheduled client medical appointments Client to use relaxation techniques to help manage anxiety symptoms.  Initial goal documentation      Materials Provided: No  Follow Up Plan: LCSW to call client/spouse in next 4 weeks to discuss client management of anxiety symptoms  The patient/spouse verbalized understanding of instructions provided today and declined a print copy of patient instruction materials.   [name].Dakota Vanwart MSW, LCSW Licensed Clinical Social  Worker [facility] Care Management [phone]

## 2019-10-02 NOTE — Chronic Care Management (AMB) (Signed)
Care Management Note   [name] is a 84 y.o. year old male who is a primary care patient of [name], Connye Burkitt, [name]. The CM team was consulted for assistance with chronic disease management and care coordination.   I reached out to [name], spouse, by phone today.    Review of patient status, including review of consultants reports, relevant laboratory and other test results, and collaboration with appropriate care team members and the patient's provider was performed as part of comprehensive patient evaluation and provision of chronic care management services.   Social Determinants of health: risk of social isolation; risk of physical inactivity; risk of depression.    Chronic Care Management from 10/07/2018 in [place]  PHQ-9 Total Score  7     GAD 7 : Generalized Anxiety Score 10/07/2018  Nervous, Anxious, on Edge 1  Control/stop worrying 0  Worry too much - different things 1  Trouble relaxing 1  Restless 1  Easily annoyed or irritable 0  Afraid - awful might happen 0  Total GAD 7 Score 4  Anxiety Difficulty Somewhat difficult   Medications   ACCU-CHEK SOFTCLIX LANCETS lancets albuterol (PROVENTIL HFA;VENTOLIN HFA) 108 (90 Base) MCG/ACT inhaler albuterol (PROVENTIL) (2.5 MG/3ML) 0.083% nebulizer solution aspirin EC 81 MG tablet atorvastatin (LIPITOR) 40 MG tablet Blood Glucose Calibration (ACCU-CHEK AVIVA) SOLN blood glucose meter kit and supplies KIT Blood Glucose Monitoring Suppl (ACCU-CHEK AVIVA PLUS) w/Device KIT diclofenac Sodium (VOLTAREN) 1 % GEL donepezil ([name]) 5 MG tablet(Expired) fluticasone furoate-vilanterol (BREO ELLIPTA) 100-25 MCG/INH AEPB glucose blood (ACCU-CHEK AVIVA PLUS) test strip Insulin Pen Needle (PEN NEEDLES) 32G X 4 MM MISC losartan (COZAAR) 50 MG tablet memantine (NAMENDA) 10 MG tablet Nebulizers (COMP AIR COMPRESSOR NEBULIZER) MISC predniSONE (DELTASONE) 20 MG tablet TRESIBA FLEXTOUCH 100  UNIT/ML SOPN FlexTouch Pen  Goals    . Client wants to talk with someone about anxiety or depression management (pt-stated)     Current Barriers:  [name] Kitchen Medical issues management in client with Chronic diagnoses of HLD, DM, HTN, Hx TIA, DM, Dementia . Anxiety issues  Clinical Social Work Clinical Goal(s):  Over the next 30 days, client/spouse will work with LCSW to address concerns related to management of anxiety symptoms of client.  Interventions:   Previously talked with client/spouse about client use of relaxation techniques (taking walk,working in yard,being outdoors)  Previously talked with client/spouse about family support.  Previously encouraged that client/spouse talk with RN CM [name] about nursing needs of client  Talked with client/spouse previously about client completion of daily ADLs   Previously talked with [name] about pain issues of client  Previously talked with [name] about [facility] program in [place], [place] and encouraged [name] to talk with [name] about [facility] program activities  Patient Self Care Activities:  . Attends all scheduled provider appointments . Performs ADL's independently . Calls provider office for new concerns or questions   Plan: Client/spouse to talk with RN CM to discuss nursing needs of client LCSW to call client/spouse in next 4 weeks to discuss client management of anxiety symptoms Client to attend scheduled client medical appointments Client to use relaxation techniques to help manage anxiety symptoms.  Initial goal documentation      Follow Up Plan: LCSW to call client or spouse in next 4 weeks to talk about client management of anxiety symptoms faced.  [name].Aubrianna Orchard MSW, LCSW Licensed Clinical Social Worker [facility] Care Management [phone]

## 2019-11-03 ENCOUNTER — Ambulatory Visit: Payer: Self-pay | Admitting: Licensed Clinical Social Worker

## 2019-11-03 DIAGNOSIS — F0390 Unspecified dementia without behavioral disturbance: Secondary | ICD-10-CM

## 2019-11-03 DIAGNOSIS — E785 Hyperlipidemia, unspecified: Secondary | ICD-10-CM

## 2019-11-03 DIAGNOSIS — E1159 Type 2 diabetes mellitus with other circulatory complications: Secondary | ICD-10-CM

## 2019-11-03 DIAGNOSIS — Z8673 Personal history of transient ischemic attack (TIA), and cerebral infarction without residual deficits: Secondary | ICD-10-CM

## 2019-11-03 DIAGNOSIS — E1169 Type 2 diabetes mellitus with other specified complication: Secondary | ICD-10-CM

## 2019-11-03 NOTE — Patient Instructions (Addendum)
Licensed Clinical Social Worker Visit Information  Materials Provided: No  [name] is a 84 y.o. year old male who is a primary care patient of Dettinger, Vonna Kotyk. The CCM team was consulted for assistance with [name] .   Review of patient status, including review of consultants reports, other relevant assessments, and collaboration with appropriate care team members and the patient's provider was performed as part of comprehensive patient evaluation and provision of chronic care management services.   SDOH (Social Determinants of Health) assessments performed: Yes;risk of depression; risk of tobacco exposure, risk of stress  LCSW called home phone number for client and for Autumn Hampe, spouse of client, today and LCSW was not able to speak with client/[name] about client needs. LCSW did leave phone message for client/[name] today requesting a return call to discuss current client needs   Follow Up Plan: LCSW to call client or spouse of client in next 4 weeks to talk with client or spouse about client management of anxiety symptoms  LCSW was not able to speak with client/spouse today via phone; thus the patient or spouse were not able to verbalize understanding of instructions provided today and were not able to accept or decline a print copy of patient instruction materials.   [name].Bensyn Bornemann MSW, LCSW Licensed Clinical Social Worker [facility] Care Management [phone]

## 2019-11-03 NOTE — Chronic Care Management (AMB) (Signed)
Chronic Care Management    Clinical Social Work Follow Up Note  11/03/2019 Name: [name] MRN: [id number] DOB: [birth year]  [name] is a 84 y.o. year old male who is a primary care patient of Newman, Kenneth Kotyk. The CCM team was consulted for assistance with [name] .   Review of patient status, including review of consultants reports, other relevant assessments, and collaboration with appropriate care team members and the patient's provider was performed as part of comprehensive patient evaluation and provision of chronic care management services.    SDOH (Social Determinants of Health) assessments performed: Yes;risk of depression; risk of tobacco exposure, risk of stress    Chronic Care Management from 10/07/2018 in [place]  PHQ-9 Total Score  7     GAD 7 : Generalized Anxiety Score 10/07/2018  Nervous, Anxious, on Edge 1  Control/stop worrying 0  Worry too much - different things 1  Trouble relaxing 1  Restless 1  Easily annoyed or irritable 0  Afraid - awful might happen 0  Total GAD 7 Score 4  Anxiety Difficulty Somewhat difficult    Outpatient Encounter Medications as of 11/03/2019  Medication Sig  . ACCU-CHEK SOFTCLIX LANCETS lancets Check glucose twice a day  . albuterol (PROVENTIL HFA;VENTOLIN HFA) 108 (90 Base) MCG/ACT inhaler Inhale 2 puffs into the lungs every 4 (four) hours as needed for wheezing or shortness of breath.  [name] Kitchen albuterol (PROVENTIL) (2.5 MG/3ML) 0.083% nebulizer solution Take 3 mLs (2.5 mg total) by nebulization every 6 (six) hours as needed for wheezing or shortness of breath.  [name] Kitchen aspirin EC 81 MG tablet Take 162 mg by mouth daily.   [name] Kitchen atorvastatin (LIPITOR) 40 MG tablet TAKE 1 TABLET BY MOUTH EVERY DAY  . Blood Glucose Calibration (ACCU-CHEK AVIVA) SOLN Use with glucometer  . blood glucose meter kit and supplies KIT Daily. Onetouch Verio  . Blood Glucose Monitoring Suppl (ACCU-CHEK AVIVA PLUS) w/Device KIT  Check glucose twice a day  . diclofenac Sodium (VOLTAREN) 1 % GEL Apply 2 g topically 4 (four) times daily.  [name] Kitchen donepezil ([name]) 5 MG tablet Take 1 tablet (5 mg total) by mouth at bedtime for 30 days.  . fluticasone furoate-vilanterol (BREO ELLIPTA) 100-25 MCG/INH AEPB Inhale 1 puff into the lungs daily.  [name] Kitchen glucose blood (ACCU-CHEK AVIVA PLUS) test strip Check glucose twice a day  . Insulin Pen Needle (PEN NEEDLES) 32G X 4 MM MISC 1 each by Does not apply route 2 (two) times daily.  [name] Kitchen losartan (COZAAR) 50 MG tablet Take 1 tablet (50 mg total) by mouth daily.  . memantine (NAMENDA) 10 MG tablet Take 1 tablet (10 mg total) by mouth 2 (two) times daily.  . Nebulizers (COMP AIR COMPRESSOR NEBULIZER) MISC   . predniSONE (DELTASONE) 20 MG tablet 2 po at sametime daily for 5 days  . TRESIBA FLEXTOUCH 100 UNIT/ML SOPN FlexTouch Pen Inject 0.16 mLs (16 Units total) into the skin daily at 10 pm.   No facility-administered encounter medications on file as of 11/03/2019.     LCSW called home phone number for client and for [name], spouse of client, today and LCSW was not able to speak with client/[name] about client needs. LCSW did leave phone message for client/[name] today requesting a return call to discuss current client needs   Follow Up Plan: LCSW to call client or spouse of client in next 4 weeks to talk with client or spouse about client management of anxiety symptoms  [name].Arsh Feutz MSW, LCSW Licensed Clinical Social Worker [facility] Care Management [phone]

## 2019-11-16 ENCOUNTER — Other Ambulatory Visit: Payer: Self-pay | Admitting: *Deleted

## 2019-11-16 DIAGNOSIS — E1159 Type 2 diabetes mellitus with other circulatory complications: Secondary | ICD-10-CM

## 2019-11-16 MED ORDER — LOSARTAN POTASSIUM 50 MG PO TABS: 50.0000 mg | ORAL_TABLET | Freq: Every day | ORAL | 0 refills | Status: DC

## 2019-11-27 ENCOUNTER — Other Ambulatory Visit: Payer: Self-pay | Admitting: *Deleted

## 2019-11-27 DIAGNOSIS — E1159 Type 2 diabetes mellitus with other circulatory complications: Secondary | ICD-10-CM

## 2019-11-30 ENCOUNTER — Ambulatory Visit: Payer: Medicare HMO | Admitting: Family Medicine

## 2019-12-08 ENCOUNTER — Ambulatory Visit: Payer: Self-pay | Admitting: Licensed Clinical Social Worker

## 2019-12-08 DIAGNOSIS — I152 Hypertension secondary to endocrine disorders: Secondary | ICD-10-CM

## 2019-12-08 DIAGNOSIS — Z8673 Personal history of transient ischemic attack (TIA), and cerebral infarction without residual deficits: Secondary | ICD-10-CM

## 2019-12-08 DIAGNOSIS — Z794 Long term (current) use of insulin: Secondary | ICD-10-CM

## 2019-12-08 DIAGNOSIS — F0390 Unspecified dementia without behavioral disturbance: Secondary | ICD-10-CM

## 2019-12-08 DIAGNOSIS — E785 Hyperlipidemia, unspecified: Secondary | ICD-10-CM

## 2019-12-08 NOTE — Chronic Care Management (AMB) (Signed)
Chronic Care Management    Clinical Social Work Follow Up Note  12/08/2019 Name: [name] MRN: [id number] DOB: 12-03-1935  [name] is a 84 y.o. year old male who is a primary care patient of [name], [name] MD. The CCM team was consulted for assistance with [name] .   Review of patient status, including review of consultants reports, other relevant assessments, and collaboration with appropriate care team members and the patient's provider was performed as part of comprehensive patient evaluation and provision of chronic care management services.    SDOH (Social Determinants of Health) assessments performed: No; risk for tobacco use; risk for depression    Chronic Care Management from 10/07/2018 in [place]  PHQ-9 Total Score  7     GAD 7 : Generalized Anxiety Score 10/07/2018  Nervous, Anxious, on Edge 1  Control/stop worrying 0  Worry too much - different things 1  Trouble relaxing 1  Restless 1  Easily annoyed or irritable 0  Afraid - awful might happen 0  Total GAD 7 Score 4  Anxiety Difficulty Somewhat difficult    Outpatient Encounter Medications as of 12/08/2019  Medication Sig  . ACCU-CHEK SOFTCLIX LANCETS lancets Check glucose twice a day  . albuterol (PROVENTIL HFA;VENTOLIN HFA) 108 (90 Base) MCG/ACT inhaler Inhale 2 puffs into the lungs every 4 (four) hours as needed for wheezing or shortness of breath.  [name] Kitchen albuterol (PROVENTIL) (2.5 MG/3ML) 0.083% nebulizer solution Take 3 mLs (2.5 mg total) by nebulization every 6 (six) hours as needed for wheezing or shortness of breath.  [name] Kitchen aspirin EC 81 MG tablet Take 162 mg by mouth daily.   [name] Kitchen atorvastatin (LIPITOR) 40 MG tablet TAKE 1 TABLET BY MOUTH EVERY DAY  . Blood Glucose Calibration (ACCU-CHEK AVIVA) SOLN Use with glucometer  . blood glucose meter kit and supplies KIT Daily. Onetouch Verio  . Blood Glucose Monitoring Suppl (ACCU-CHEK AVIVA PLUS) w/Device KIT Check glucose twice a  day  . diclofenac Sodium (VOLTAREN) 1 % GEL Apply 2 g topically 4 (four) times daily.  [name] Kitchen donepezil ([name]) 5 MG tablet Take 1 tablet (5 mg total) by mouth at bedtime for 30 days.  . fluticasone furoate-vilanterol (BREO ELLIPTA) 100-25 MCG/INH AEPB Inhale 1 puff into the lungs daily.  [name] Kitchen glucose blood (ACCU-CHEK AVIVA PLUS) test strip Check glucose twice a day  . Insulin Pen Needle (PEN NEEDLES) 32G X 4 MM MISC 1 each by Does not apply route 2 (two) times daily.  [name] Kitchen losartan (COZAAR) 50 MG tablet Take 1 tablet (50 mg total) by mouth daily.  . memantine (NAMENDA) 10 MG tablet Take 1 tablet (10 mg total) by mouth 2 (two) times daily.  . Nebulizers (COMP AIR COMPRESSOR NEBULIZER) MISC   . predniSONE (DELTASONE) 20 MG tablet 2 po at sametime daily for 5 days  . TRESIBA FLEXTOUCH 100 UNIT/ML SOPN FlexTouch Pen Inject 0.16 mLs (16 Units total) into the skin daily at 10 pm.   No facility-administered encounter medications on file as of 12/08/2019.    LCSW called home phone number of client today and attempted to speak via phone with client or with his spouse, Carlo Guevarra. LCSW was not able to speak with client or his spouse via phone today. Voice mailbox for phone was full for client number; thus, LCSW was not able to leave message for client  Follow Up Plan: LCSW to call client or spouse of client in next 4 weeks to talk with client or  spouse about client management of anxiety symptoms  [name].Azaya Goedde MSW, LCSW Licensed Clinical Social Worker [facility] Care Management [phone]

## 2019-12-08 NOTE — Patient Instructions (Addendum)
Licensed Clinical Social Worker Visit Information  Materials Provided: No  12/08/2019   Name: [name] MRN: [id number] DOB: Nov 19, 1935   [name] is a 84 y.o. year old male who is a primary care patient of [name], [name] MD. The CCM team was consulted for assistance with [name] .   Review of patient status, including review of consultants reports, other relevant assessments, and collaboration with appropriate care team members and the patient's provider was performed as part of comprehensive patient evaluation and provision of chronic care management services.   SDOH (Social Determinants of Health) assessments performed: No; risk for tobacco use; risk for depression   LCSW called home phone number of client today and attempted to speak via phone with client or with his spouse, [name]. LCSW was not able to speak with client or his spouse via phone today. Voice mailbox for phone was full for client number; thus, LCSW was not able to leave a phone message for client   Follow Up Plan: LCSW to call client or spouse of client in next 4 weeks to talk with client or spouse about client management of anxiety symptoms  LCSW was not able to speak via phone today with client or his spouse; thus, the patient or his spouse were not able to verbalize understanding of instructions provided today and were not able to accept or decline a print copy of patient instruction materials.   [name].Dez Stauffer MSW, LCSW Licensed Clinical Social Worker [facility] Care Management [phone]

## 2019-12-18 ENCOUNTER — Other Ambulatory Visit: Payer: Self-pay | Admitting: *Deleted

## 2019-12-18 DIAGNOSIS — E1129 Type 2 diabetes mellitus with other diabetic kidney complication: Secondary | ICD-10-CM

## 2019-12-18 DIAGNOSIS — F0150 Vascular dementia without behavioral disturbance: Secondary | ICD-10-CM

## 2019-12-18 DIAGNOSIS — I152 Hypertension secondary to endocrine disorders: Secondary | ICD-10-CM

## 2019-12-18 NOTE — Telephone Encounter (Signed)
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 [employer] LOV 11/27/18

## 2019-12-21 NOTE — Telephone Encounter (Signed)
Wife aware and will have patient call to schedule with provider.

## 2019-12-28 ENCOUNTER — Ambulatory Visit: Payer: Medicare HMO | Admitting: Family Medicine

## 2019-12-29 ENCOUNTER — Ambulatory Visit (INDEPENDENT_AMBULATORY_CARE_PROVIDER_SITE_OTHER): Payer: Medicare HMO

## 2019-12-29 DIAGNOSIS — Z0000 Encounter for general adult medical examination without abnormal findings: Secondary | ICD-10-CM

## 2019-12-29 NOTE — Patient Instructions (Signed)
  [place] Maintenance Summary and Written Plan of Care  [name] ,  Thank you for allowing me to perform your Medicare Annual Wellness Visit and for your ongoing commitment to your health.   Health Maintenance & Immunization History Health Maintenance  Topic Date Due  . COVID-19 Vaccine (1) Never done  . OPHTHALMOLOGY EXAM  02/02/2017  . FOOT EXAM  01/04/2019  . HEMOGLOBIN A1C  05/30/2019  . INFLUENZA VACCINE  02/07/2020  . TETANUS/TDAP  07/04/2026  . PNA vac Low Risk Adult  Completed   Immunization History  Administered Date(s) Administered  . Fluad Quad(high Dose 65+) 04/16/2019  . Influenza Split 03/26/2012  . Influenza, High Dose Seasonal PF 03/28/2016, 04/08/2017, 04/09/2018  . Influenza,inj,Quad PF,6+ Mos 04/21/2014  . Influenza-Unspecified 05/10/2015  . Pneumococcal Conjugate-13 08/18/2015  . Pneumococcal Polysaccharide-23 06/08/2014  . Td 07/04/2016    These are the patient goals that we discussed: Goals Addressed            This Visit's Progress   . Client will verbalize knowledge of diabetes self-management as evidenced by Hgb A1C <7 or as defined by provider.       Diabetes self management actions:  Glucose monitoring per provider recommendations  Perform Quality checks on blood meter  Eat Healthy  Check feet daily  Visit provider every 3-6 months as directed  Hbg A1C level every 3-6 months.  Eye Exam yearly    . Exercise 150 min/wk Moderate Activity      . Prevent falls          This is a list of Health Maintenance Items that are overdue or due now: Health Maintenance Due  Topic Date Due  . COVID-19 Vaccine (1) Never done  . OPHTHALMOLOGY EXAM  02/02/2017  . FOOT EXAM  01/04/2019  . HEMOGLOBIN A1C  05/30/2019     Orders/Referrals Placed Today: No orders of the defined types were placed in this encounter.  (Contact our referral department at [phone] if you have not spoken with someone about your  referral appointment within the next 5 days)    Follow-up Plan  Scheduled with Claretta Fraise, MD 01/05/2020 at 1:55pm.

## 2019-12-29 NOTE — Progress Notes (Signed)
MEDICARE ANNUAL WELLNESS VISIT  12/29/2019  Telephone Visit Disclaimer This Medicare AWV was conducted by telephone due to national recommendations for restrictions regarding the COVID-19 Pandemic (e.g. social distancing).  I verified, using two identifiers, that I am speaking with [name] or their authorized healthcare agent. I discussed the limitations, risks, security, and privacy concerns of performing an evaluation and management service by telephone and the potential availability of an in-person appointment in the future. The patient expressed understanding and agreed to proceed.   Subjective:  [name] is a 84 y.o. male patient of [name], [name], MD who had a Medicare Annual Wellness Visit today via telephone. [name] is Retired and lives with their spouse. he has six children. he reports that he is socially active and does interact with friends/family regularly. he is not physically active and enjoys.  Patient Care Team: Claretta Fraise, MD as PCP - General (Family Medicine) Shea Evans, [name], LCSW as Social Worker (Licensed Clinical Social Worker) Cori Razor, Delice Bison, RN as Case Manager Collene Gobble, MD as Consulting Physician (Pulmonary Disease)  Advanced Directives 12/29/2019 12/26/2018 11/10/2018 10/10/2017 10/09/2017 04/27/2016 02/12/2016  Does Patient Have a Medical Advance Directive? No No No - No No No  Type of Advance Directive - - - - - - -  Would patient like information on creating a medical advance directive? No - Patient declined Yes (MAU/Ambulatory/Procedural Areas - Information given) No - Patient declined Yes (MAU/Ambulatory/Procedural Areas - Information given) - No - patient declined information -  Pre-existing out of facility DNR order (yellow form or pink MOST form) - - - - - - -    Hospital Utilization Over the Past 12 Months: # of hospitalizations or ER visits: 0 # of surgeries: 0  Review of Systems    Patient reports that his overall health is  unchanged compared to last year.    Patient Reported Readings (BP, Pulse, CBG, Weight, etc) NONE  Pain Assessment Pain : No/denies pain     Current Medications & Allergies (verified) Allergies as of 12/29/2019      Reactions   Ace Inhibitors Swelling      Medication List       Accurate as of December 29, 2019 10:32 AM. If you have any questions, ask your nurse or doctor.        STOP taking these medications   predniSONE 20 MG tablet Commonly known as: Deltasone     TAKE these medications   Accu-Chek Aviva Plus w/Device Kit Check glucose twice a day   Accu-Chek Aviva Soln Use with glucometer   Accu-Chek Softclix Lancets lancets Check glucose twice a day   albuterol (2.5 MG/3ML) 0.083% nebulizer solution Commonly known as: PROVENTIL Take 3 mLs (2.5 mg total) by nebulization every 6 (six) hours as needed for wheezing or shortness of breath.   albuterol 108 (90 Base) MCG/ACT inhaler Commonly known as: VENTOLIN HFA Inhale 2 puffs into the lungs every 4 (four) hours as needed for wheezing or shortness of breath.   aspirin EC 81 MG tablet Take 162 mg by mouth daily.   atorvastatin 40 MG tablet Commonly known as: LIPITOR TAKE 1 TABLET BY MOUTH EVERY DAY   blood glucose meter kit and supplies Kit Daily. Onetouch Verio   Biomedical scientist   diclofenac Sodium 1 % Gel Commonly known as: Voltaren Apply 2 g topically 4 (four) times daily.   donepezil 5 MG tablet Commonly known as: [name] Take 1  tablet (5 mg total) by mouth at bedtime for 30 days.   fluticasone furoate-vilanterol 100-25 MCG/INH Aepb Commonly known as: Breo Ellipta Inhale 1 puff into the lungs daily.   glucose blood test strip Commonly known as: Accu-Chek Aviva Plus Check glucose twice a day   losartan 50 MG tablet Commonly known as: COZAAR Take 1 tablet (50 mg total) by mouth daily.   memantine 10 MG tablet Commonly known as: NAMENDA Take 1 tablet (10 mg total) by mouth  2 (two) times daily.   Pen Needles 32G X 4 MM Misc 1 each by Does not apply route 2 (two) times daily.   Tyler Aas FlexTouch 100 UNIT/ML FlexTouch Pen Generic drug: insulin degludec Inject 0.16 mLs (16 Units total) into the skin daily at 10 pm.       History (reviewed): Past Medical History:  Diagnosis Date  . Anxiety   . Asthma   . Choledocholithiasis 02/24/2013  . COPD (chronic obstructive pulmonary disease) ([place])   . CVA (cerebral vascular accident) ([place]) 2013   loss of memory  . Dementia ([place])   . Diabetes mellitus   . Hx-TIA (transient ischemic attack)   . Hyperlipidemia   . Hypertension    Past Surgical History:  Procedure Laterality Date  . CATARACT EXTRACTION W/PHACO Left 04/30/2016   Procedure: CATARACT EXTRACTION PHACO AND INTRAOCULAR LENS PLACEMENT LEFT EYE CDE=9.83;  Surgeon: Tonny Branch, MD;  Location: [facility];  Service: Ophthalmology;  Laterality: Left;  left  . CHOLECYSTECTOMY N/A 02/23/2013   Procedure: LAPAROSCOPIC CHOLECYSTECTOMY;  Surgeon: Jamesetta So, MD;  Location: [facility];  Service: General;  Laterality: N/A;  . ERCP N/A 02/24/2013   Procedure: ENDOSCOPIC RETROGRADE CHOLANGIOPANCREATOGRAPHY;  Surgeon: Daneil Dolin, MD;  Location: [facility];  Service: Endoscopy;  Laterality: N/A;  . ROTATOR CUFF REPAIR Right    Family History  Problem Relation Age of Onset  . Hypertension Mother   . Hypertension Father   . Diabetes Sister   . Diabetes Sister   . Heart disease Brother   . Emphysema Brother   . Colon cancer Neg Hx    Social History   Socioeconomic History  . Marital status: Married    Spouse name: [name]  . Number of children: 6  . Years of education: 11th  . Highest education level: 11th grade  Occupational History  . Occupation: Retired    Comment: security - Marine scientist   Tobacco Use  . Smoking status: Never Smoker  . Smokeless tobacco: Never Used  Vaping Use  . Vaping Use: Never used  Substance and Sexual Activity  . Alcohol use: No    . Drug use: No  . Sexual activity: Not Currently  Other Topics Concern  . Not on file  Social History Narrative   Patient lives at home with wife, linda = oldest daughter lives there also    Caffeine Use: 1 cup of coffee and 2 can pepsi   Social Determinants of Health   Financial Resource Strain:   . Difficulty of Paying Living Expenses:   Food Insecurity:   . Worried About Charity fundraiser in the Last Year:   . Arboriculturist in the Last Year:   Transportation Needs:   . Film/video editor (Medical):   [name] Kitchen Lack of Transportation (Non-Medical):   Physical Activity:   . Days of Exercise per Week:   . Minutes of Exercise per Session:   Stress:   . Feeling of Stress :  Social Connections:   . Frequency of Communication with Friends and Family:   . Frequency of Social Gatherings with Friends and Family:   . Attends Religious Services:   . Active Member of Clubs or Organizations:   . Attends Archivist Meetings:   [name] Kitchen Marital Status:     Activities of Daily Living In your present state of health, do you have any difficulty performing the following activities: 12/29/2019  Hearing? Y  Vision? N  Difficulty concentrating or making decisions? Y  Walking or climbing stairs? N  Dressing or bathing? N  Doing errands, shopping? Y  Preparing Food and eating ? N  Using the Toilet? N  In the past six months, have you accidently leaked urine? N  Do you have problems with loss of bowel control? N  Managing your Medications? Y  Managing your Finances? Y  Housekeeping or managing your Housekeeping? Y  Some recent data might be hidden    Patient Education/ Literacy How often do you need to have someone help you when you read instructions, pamphlets, or other written materials from your doctor or pharmacy?: 4 - Often What is the last grade level you completed in school?: 11th  Exercise Current Exercise Habits: The patient does not participate in regular exercise at  present, Exercise limited by: neurologic condition(s)  Diet Patient reports consuming 3 meals a day and 2 snack(s) a day Patient reports that his primary diet is: Regular Patient reports that she does have regular access to food.   Depression Screen PHQ 2/9 Scores 12/29/2019 05/22/2019 12/26/2018 11/27/2018 11/10/2018 10/07/2018 09/23/2018  PHQ - 2 Score 0 0 0 - 0 2 0  PHQ- 9 Score - - - - - 7 -  Exception Documentation - - - Medical reason - - -     Fall Risk Fall Risk  12/29/2019 05/22/2019 12/26/2018 11/27/2018 11/10/2018  Falls in the past year? 0 0 0 0 1  Number falls in past yr: - - 0 - 0  Injury with Fall? - - 0 - 0  Risk Factor Category  - - - - -  Risk for fall due to : - - - - -  Follow up - - - - -     Objective:  [name] seemed alert and oriented and he participated appropriately during our telephone visit.  Blood Pressure Weight BMI  BP Readings from Last 3 Encounters:  05/22/19 121/78  11/27/18 120/69  11/10/18 135/82   Wt Readings from Last 3 Encounters:  05/22/19 200 lb (90.7 kg)  12/26/18 212 lb (96.2 kg)  11/27/18 212 lb (96.2 kg)   BMI Readings from Last 1 Encounters:  05/22/19 27.12 kg/m    *Unable to obtain current vital signs, weight, and BMI due to telephone visit type  Hearing/Vision  . Tuck did  seem to have difficulty with hearing/understanding during the telephone conversation . Reports that he has not had a formal eye exam by an eye care professional within the past year . Reports that he has not had a formal hearing evaluation within the past year *Unable to fully assess hearing and vision during telephone visit type  Cognitive Function: 6CIT Screen 12/29/2019 12/26/2018  What Year? 0 points 4 points  What month? 0 points 3 points  What time? 0 points 3 points  Count back from 20 2 points 4 points  Months in reverse 0 points 4 points  Repeat phrase 10 points 6 points  Total Score 12 24   (  Normal:0-7, Significant for Dysfunction:  >8)  Normal Cognitive Function Screening: No: 12   Immunization & Health Maintenance Record Immunization History  Administered Date(s) Administered  . Fluad Quad(high Dose 65+) 04/16/2019  . Influenza Split 03/26/2012  . Influenza, High Dose Seasonal PF 03/28/2016, 04/08/2017, 04/09/2018  . Influenza,inj,Quad PF,6+ Mos 04/21/2014  . Influenza-Unspecified 05/10/2015  . Pneumococcal Conjugate-13 08/18/2015  . Pneumococcal Polysaccharide-23 06/08/2014  . Td 07/04/2016    Health Maintenance  Topic Date Due  . COVID-19 Vaccine (1) Never done  . OPHTHALMOLOGY EXAM  02/02/2017  . FOOT EXAM  01/04/2019  . HEMOGLOBIN A1C  05/30/2019  . INFLUENZA VACCINE  02/07/2020  . TETANUS/TDAP  07/04/2026  . PNA vac Low Risk Adult  Completed       Assessment  This is a routine wellness examination for [name].  Health Maintenance: Due or Overdue Health Maintenance Due  Topic Date Due  . COVID-19 Vaccine (1) Never done  . OPHTHALMOLOGY EXAM  02/02/2017  . FOOT EXAM  01/04/2019  . HEMOGLOBIN A1C  05/30/2019    [name] does not need a referral for Community Assistance: Care Management:   no Social Work:    no Prescription Assistance:  no Nutrition/Diabetes Education:  no   Plan:  Personalized Goals Goals Addressed            This Visit's Progress   . Client will verbalize knowledge of diabetes self-management as evidenced by Hgb A1C <7 or as defined by provider.       Diabetes self management actions:  Glucose monitoring per provider recommendations  Perform Quality checks on blood meter  Eat Healthy  Check feet daily  Visit provider every 3-6 months as directed  Hbg A1C level every 3-6 months.  Eye Exam yearly    . Exercise 150 min/wk Moderate Activity      . Prevent falls        Personalized Health Maintenance & Screening Recommendations    Lung Cancer Screening Recommended: no (Low Dose CT Chest recommended if Age 9-80 years, 30 pack-year  currently smoking OR have quit w/in past 15 years) Hepatitis C Screening recommended: no HIV Screening recommended: no  Advanced Directives: Written information was not prepared per patient's request.  Referrals & Orders No orders of the defined types were placed in this encounter.   Follow-up Plan . Follow-up with Claretta Fraise, MD as planned . Schedule 01/05/2020    I have personally reviewed and noted the following in the patient's chart:   . Medical and social history . Use of alcohol, tobacco or illicit drugs  . Current medications and supplements . Functional ability and status . Nutritional status . Physical activity . Advanced directives . List of other physicians . Hospitalizations, surgeries, and ER visits in previous 12 months . Vitals . Screenings to include cognitive, depression, and falls . Referrals and appointments  In addition, I have reviewed and discussed with [name] certain preventive protocols, quality metrics, and best practice recommendations. A written personalized care plan for preventive services as well as general preventive health recommendations is available and can be mailed to the patient at his request.      Maud Deed [facility]  7/68/1157

## 2019-12-30 ENCOUNTER — Ambulatory Visit: Payer: Medicare HMO | Admitting: Family Medicine

## 2020-01-05 ENCOUNTER — Ambulatory Visit (INDEPENDENT_AMBULATORY_CARE_PROVIDER_SITE_OTHER): Payer: Medicare HMO | Admitting: Family Medicine

## 2020-01-05 ENCOUNTER — Encounter: Payer: Self-pay | Admitting: Family Medicine

## 2020-01-05 ENCOUNTER — Other Ambulatory Visit: Payer: Self-pay

## 2020-01-05 VITALS — BP 130/74 | HR 96 | Temp 98.0°F | Ht 72.0 in | Wt 195.2 lb

## 2020-01-05 DIAGNOSIS — I152 Hypertension secondary to endocrine disorders: Secondary | ICD-10-CM

## 2020-01-05 DIAGNOSIS — Z794 Long term (current) use of insulin: Secondary | ICD-10-CM

## 2020-01-05 DIAGNOSIS — I10 Essential (primary) hypertension: Secondary | ICD-10-CM

## 2020-01-05 DIAGNOSIS — E1169 Type 2 diabetes mellitus with other specified complication: Secondary | ICD-10-CM

## 2020-01-05 DIAGNOSIS — E785 Hyperlipidemia, unspecified: Secondary | ICD-10-CM

## 2020-01-05 DIAGNOSIS — E1159 Type 2 diabetes mellitus with other circulatory complications: Secondary | ICD-10-CM

## 2020-01-05 DIAGNOSIS — Z125 Encounter for screening for malignant neoplasm of prostate: Secondary | ICD-10-CM | POA: Diagnosis not present

## 2020-01-05 DIAGNOSIS — R809 Proteinuria, unspecified: Secondary | ICD-10-CM | POA: Diagnosis not present

## 2020-01-05 DIAGNOSIS — F0150 Vascular dementia without behavioral disturbance: Secondary | ICD-10-CM | POA: Diagnosis not present

## 2020-01-05 DIAGNOSIS — E1129 Type 2 diabetes mellitus with other diabetic kidney complication: Secondary | ICD-10-CM | POA: Diagnosis not present

## 2020-01-05 LAB — BAYER DCA HB A1C WAIVED: HB A1C (BAYER DCA - WAIVED): 9.6 % — ABNORMAL HIGH (ref <7.0)

## 2020-01-05 MED ORDER — TRESIBA FLEXTOUCH 100 UNIT/ML ~~LOC~~ SOPN: 20.0000 [IU] | PEN_INJECTOR | Freq: Every day | SUBCUTANEOUS | 3 refills | Status: DC

## 2020-01-05 MED ORDER — LOSARTAN POTASSIUM 50 MG PO TABS: 50.0000 mg | ORAL_TABLET | Freq: Every day | ORAL | 1 refills | Status: DC

## 2020-01-05 MED ORDER — DONEPEZIL HCL 10 MG PO TABS: 10.0000 mg | ORAL_TABLET | Freq: Every day | ORAL | 1 refills | Status: DC

## 2020-01-05 MED ORDER — ATORVASTATIN CALCIUM 40 MG PO TABS: 40.0000 mg | ORAL_TABLET | Freq: Every day | ORAL | 1 refills | Status: DC

## 2020-01-05 NOTE — Progress Notes (Signed)
Subjective:  Patient ID: [name], male    DOB: 1936/03/12  Age: 84 y.o. MRN: [id number]  CC: Follow-up and Knee Pain (left )   HPI [name] presents for Follow-up of diabetes. Patient denies symptoms such as polyuria, polydipsia, excessive hunger, nausea.  He is not currently checking his blood sugar.  His wife gives him his insulin.  When asked him about taking insulin injections he asked if he was supposed to give them in his arm.  He does not recall taking them.  However his daughter who is with him assures me that his wife gives him to them daily.  She also tells me that he eats a lot of sweets.  They keep them around the house too much. No significant hypoglycemic spells noted. Medications reviewed. Pt reports taking them regularly without complication/adverse reaction being reported today.   [name] daughter tells me that his wife is the primary caretaker since she and her sister both home full-time jobs.  His wife is unable to handle all of the work involved in this and needs help.  They asked for a home health aide.  Patient suffers from dementia.  He has also had a stroke.  He does not have any behavioral disturbance of note.  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  These things were repeated through the evaluation approximately 5 times.   Follow-up of hypertension. Patient has no history of headache chest pain or shortness of breath or recent cough. Patient also denies symptoms of TIA  Patient denies side effects from his medication. States taking it regularly.  Patient in for follow-up of elevated cholesterol. Doing well without complaints on current medication. Denies side effects of statin including myalgia and arthralgia and nausea. Also in today for liver function testing. Currently no chest pain, shortness of breath or other  cardiovascular related symptoms noted.  Patient has been diagnosed with COPD in the past he has prescriptions for [facility] and albuterol.  However the daughter says that he was diagnosed with a little COPD a long time ago but he is fine now.      History [name] has a past medical history of Anxiety, Asthma, Choledocholithiasis (02/24/2013), COPD (chronic obstructive pulmonary disease) ([place]), CVA (cerebral vascular accident) ([place]) (2013), Dementia ([place]), Diabetes mellitus, TIA (transient ischemic attack), Hyperlipidemia, and Hypertension.   He has a past surgical history that includes Rotator cuff repair (Right); Cholecystectomy (N/A, 02/23/2013); ERCP (N/A, 02/24/2013); and Cataract extraction w/PHACO (Left, 04/30/2016).   His family history includes Diabetes in his sister and sister; Emphysema in his brother; Heart disease in his brother; Hypertension in his father and mother.He reports that he has never smoked. He has never used smokeless tobacco. He reports that he does not drink alcohol and does not use drugs.  Current Outpatient Medications on File Prior to Visit  Medication Sig Dispense Refill  . ACCU-CHEK SOFTCLIX LANCETS lancets Check glucose twice a day 200 each 3  . albuterol (PROVENTIL HFA;VENTOLIN HFA) 108 (90 Base) MCG/ACT inhaler Inhale 2 puffs into the lungs every 4 (four) hours as needed for wheezing or shortness of breath. 1 Inhaler 5  . albuterol (PROVENTIL) (2.5 MG/3ML) 0.083% nebulizer solution Take 3 mLs (2.5 mg total) by nebulization every 6 (six) hours as needed for wheezing or shortness of breath. 75 mL  3  . aspirin EC 81 MG tablet Take 162 mg by mouth daily.     . Blood Glucose Calibration (ACCU-CHEK AVIVA) SOLN Use with glucometer 1 each 0  . blood glucose meter kit and supplies KIT Daily. Onetouch Verio 1 each 0  . Blood Glucose Monitoring Suppl (ACCU-CHEK AVIVA PLUS) w/Device KIT Check glucose twice a day 1 kit 0  . fluticasone furoate-vilanterol (BREO ELLIPTA) 100-25  MCG/INH AEPB Inhale 1 puff into the lungs daily. 30 each 5  . glucose blood (ACCU-CHEK AVIVA PLUS) test strip Check glucose twice a day 200 each 3  . Insulin Pen Needle (PEN NEEDLES) 32G X 4 MM MISC 1 each by Does not apply route 2 (two) times daily. 90 each 3  . memantine (NAMENDA) 10 MG tablet Take 1 tablet (10 mg total) by mouth 2 (two) times daily. 180 tablet 3  . Nebulizers (COMP AIR COMPRESSOR NEBULIZER) MISC     . diclofenac Sodium (VOLTAREN) 1 % GEL Apply 2 g topically 4 (four) times daily. (Patient not taking: Reported on 01/05/2020) 350 g 1   No current facility-administered medications on file prior to visit.    ROS Review of Systems  Constitutional: Negative.   HENT: Negative.   Respiratory: Negative for cough and shortness of breath.   Cardiovascular: Negative for chest pain and leg swelling.  Gastrointestinal: Negative for abdominal pain, diarrhea, nausea and vomiting.  Musculoskeletal: Positive for arthralgias (left knee). Negative for myalgias.  Skin: Negative for rash.  Neurological: Negative for headaches.  Psychiatric/Behavioral: Negative for sleep disturbance.    Objective:  BP 130/74   Pulse 96   Temp 98 F (36.7 C) (Temporal)   Ht 6' (1.829 m)   Wt 195 lb 3.2 oz (88.5 kg)   BMI 26.47 kg/m   BP Readings from Last 3 Encounters:  01/05/20 130/74  05/22/19 121/78  11/27/18 120/69    Wt Readings from Last 3 Encounters:  01/05/20 195 lb 3.2 oz (88.5 kg)  05/22/19 200 lb (90.7 kg)  12/26/18 212 lb (96.2 kg)     Physical Exam Vitals reviewed.  Constitutional:      Appearance: He is well-developed.  HENT:     Head: Normocephalic and atraumatic.     Right Ear: Tympanic membrane and external ear normal. No decreased hearing noted.     Left Ear: Tympanic membrane and external ear normal. No decreased hearing noted.     Mouth/Throat:     Pharynx: No oropharyngeal exudate or posterior oropharyngeal erythema.  Eyes:     Pupils: Pupils are equal, round,  and reactive to light.  Cardiovascular:     Rate and Rhythm: Normal rate and regular rhythm.     Heart sounds: No murmur heard.   Pulmonary:     Effort: No respiratory distress.     Breath sounds: Normal breath sounds.  Abdominal:     General: Bowel sounds are normal.     Palpations: Abdomen is soft. There is no mass.     Tenderness: There is no abdominal tenderness.  Musculoskeletal:     Cervical back: Normal range of motion and neck supple.  Psychiatric:        Attention and Perception: Attention normal.        Mood and Affect: Mood and affect normal.        Speech: Speech normal.        Behavior: Behavior is cooperative.        Cognition and Memory: Cognition is impaired. Memory  is impaired.       Assessment & Plan:   [name] was seen today for follow-up and knee pain.  Diagnoses and all orders for this visit:  Hyperlipidemia associated with type 2 diabetes mellitus ([place]) -     CBC with Differential/Platelet -     CMP14+EGFR -     Referral to Chronic Care Management Services -     Lipid panel  Type 2 diabetes mellitus with other specified complication, with long-term current use of insulin (HCC) -     CBC with Differential/Platelet -     CMP14+EGFR -     Referral to Chronic Care Management Services -     TRESIBA FLEXTOUCH 100 UNIT/ML FlexTouch Pen; Inject 0.2 mLs (20 Units total) into the skin daily at 10 pm.  Hypertension associated with type 2 diabetes mellitus ([place]) -     CBC with Differential/Platelet -     CMP14+EGFR -     Referral to Chronic Care Management Services -     losartan (COZAAR) 50 MG tablet; Take 1 tablet (50 mg total) by mouth daily.  Type 2 diabetes mellitus with microalbuminuria, with long-term current use of insulin (HCC) -     CBC with Differential/Platelet -     CMP14+EGFR -     Bayer DCA Hb A1c Waived -     Referral to Chronic Care Management Services -     atorvastatin (LIPITOR) 40 MG tablet; Take 1 tablet (40 mg total) by mouth  daily.  Vascular dementia without behavioral disturbance (HCC) -     donepezil ([name]) 10 MG tablet; Take 1 tablet (10 mg total) by mouth at bedtime. -     Referral to Chronic Care Management Services  Screening for prostate cancer -     PSA Total (Reflex To Free)  We discussed dietary compliance.  Specifically I asked the daughter to show her mother the after visit summary which recommended avoiding sweets and limiting corn rice potatoes and bread products.    I have changed [name]'s donepezil, [name] FlexTouch, and atorvastatin. I am also having him maintain his aspirin EC, fluticasone furoate-vilanterol, albuterol, blood glucose meter kit and supplies, Pen Needles, glucose blood, Accu-Chek Aviva Plus, Accu-Chek Aviva, Accu-Chek Softclix Lancets, albuterol, Comp Air Compressor Nebulizer, diclofenac Sodium, memantine, and losartan.  Meds ordered this encounter  Medications  . donepezil ([name]) 10 MG tablet    Sig: Take 1 tablet (10 mg total) by mouth at bedtime.    Dispense:  90 tablet    Refill:  1  . TRESIBA FLEXTOUCH 100 UNIT/ML FlexTouch Pen    Sig: Inject 0.2 mLs (20 Units total) into the skin daily at 10 pm.    Dispense:  5 pen    Refill:  3  . atorvastatin (LIPITOR) 40 MG tablet    Sig: Take 1 tablet (40 mg total) by mouth daily.    Dispense:  90 tablet    Refill:  1  . losartan (COZAAR) 50 MG tablet    Sig: Take 1 tablet (50 mg total) by mouth daily.    Dispense:  90 tablet    Refill:  1     Follow-up: Return in about 3 months (around 04/06/2020).  Claretta Fraise, M.D.

## 2020-01-05 NOTE — Patient Instructions (Signed)
In order to bring your diabetes under better control, you must avoid eating all sweets.  You also should strictly limit how much potatoes, corn, rice and bread and pasta that you eat.

## 2020-01-06 LAB — LIPID PANEL
Chol/HDL Ratio: 3.5 ratio (ref 0.0–5.0)
Cholesterol, Total: 106 mg/dL (ref 100–199)
HDL: 30 mg/dL — ABNORMAL LOW (ref >39)
LDL Chol Calc (NIH): 45 mg/dL (ref 0–99)
Triglycerides: 186 mg/dL — ABNORMAL HIGH (ref 0–149)
VLDL Cholesterol Cal: 31 mg/dL (ref 5–40)

## 2020-01-06 LAB — SPECIMEN STATUS REPORT

## 2020-01-06 LAB — CBC WITH DIFFERENTIAL/PLATELET
Basophils Absolute: 0.1 10*3/uL (ref 0.0–0.2)
Basos: 1 %
EOS (ABSOLUTE): 0.2 10*3/uL (ref 0.0–0.4)
Eos: 3 %
Hematocrit: 42.5 % (ref 37.5–51.0)
Hemoglobin: 14.3 g/dL (ref 13.0–17.7)
Immature Grans (Abs): 0 10*3/uL (ref 0.0–0.1)
Immature Granulocytes: 0 %
Lymphocytes Absolute: 2 10*3/uL (ref 0.7–3.1)
Lymphs: 26 %
MCH: 29.9 pg (ref 26.6–33.0)
MCHC: 33.6 g/dL (ref 31.5–35.7)
MCV: 89 fL (ref 79–97)
Monocytes Absolute: 0.5 10*3/uL (ref 0.1–0.9)
Monocytes: 6 %
Neutrophils Absolute: 4.9 10*3/uL (ref 1.4–7.0)
Neutrophils: 64 %
Platelets: 212 10*3/uL (ref 150–450)
RBC: 4.79 x10E6/uL (ref 4.14–5.80)
RDW: 13 % (ref 11.6–15.4)
WBC: 7.7 10*3/uL (ref 3.4–10.8)

## 2020-01-06 LAB — CMP14+EGFR
ALT: 15 IU/L (ref 0–44)
AST: 17 IU/L (ref 0–40)
Albumin/Globulin Ratio: 1.5 (ref 1.2–2.2)
Albumin: 4.5 g/dL (ref 3.6–4.6)
Alkaline Phosphatase: 112 IU/L (ref 48–121)
BUN/Creatinine Ratio: 11 (ref 10–24)
BUN: 19 mg/dL (ref 8–27)
Bilirubin Total: 0.8 mg/dL (ref 0.0–1.2)
CO2: 20 mmol/L (ref 20–29)
Calcium: 9 mg/dL (ref 8.6–10.2)
Chloride: 104 mmol/L (ref 96–106)
Creatinine, Ser: 1.76 mg/dL — ABNORMAL HIGH (ref 0.76–1.27)
GFR calc Af Amer: 41 mL/min/{1.73_m2} — ABNORMAL LOW (ref >59)
GFR calc non Af Amer: 35 mL/min/{1.73_m2} — ABNORMAL LOW (ref >59)
Globulin, Total: 3.1 g/dL (ref 1.5–4.5)
Glucose: 261 mg/dL — ABNORMAL HIGH (ref 65–99)
Potassium: 4.1 mmol/L (ref 3.5–5.2)
Sodium: 143 mmol/L (ref 134–144)
Total Protein: 7.6 g/dL (ref 6.0–8.5)

## 2020-01-06 LAB — PSA TOTAL (REFLEX TO FREE): Prostate Specific Ag, Serum: 0.3 ng/mL (ref 0.0–4.0)

## 2020-01-07 ENCOUNTER — Telehealth: Payer: Self-pay | Admitting: Family Medicine

## 2020-01-07 NOTE — Chronic Care Management (AMB) (Signed)
  Chronic Care Management   Note  01/07/2020 Name: FARES RAMTHUN MRN: [id number] DOB: 21-May-1936  [name] is a 84 y.o. year old male who is a primary care patient of [name], [name], MD and is actively engaged with the care management team. I reached out to [name] by phone today to assist with scheduling an initial visit with the RN Case Manager  Follow up plan: Telephone appointment with care management team member scheduled for:02/15/2020  Annalysse Shoemaker  Care Guide, [place] [facility]  [place], [place] [address] Direct Dial: [email or web address] Website: [email or web address]

## 2020-01-07 NOTE — Chronic Care Management (AMB) (Signed)
  Chronic Care Management   Note  01/07/2020 Name: [name] MRN: [id number] DOB: 10-Apr-1936  [name] is a 84 y.o. year old male who is a primary care patient of [name], [name], MD and is actively engaged with the care management team. I reached out to [name] by phone today to assist with scheduling an initial visit with the RN Case Manager.  Follow up plan: Unsuccessful telephone outreach attempt made. A HIPPA compliant phone message was left for the patient providing contact information and requesting a return call.  The care management team will reach out to the patient again over the next 7 days.  If patient returns call to provider office, please advise to call [place]  at [place], [facility]  [place], [place] [address] Direct Dial: [email or web address] Website: [email or web address]

## 2020-01-12 ENCOUNTER — Ambulatory Visit: Payer: Medicare HMO | Admitting: *Deleted

## 2020-01-12 ENCOUNTER — Ambulatory Visit (INDEPENDENT_AMBULATORY_CARE_PROVIDER_SITE_OTHER): Payer: Medicare HMO | Admitting: Licensed Clinical Social Worker

## 2020-01-12 DIAGNOSIS — F0390 Unspecified dementia without behavioral disturbance: Secondary | ICD-10-CM | POA: Diagnosis not present

## 2020-01-12 DIAGNOSIS — E1159 Type 2 diabetes mellitus with other circulatory complications: Secondary | ICD-10-CM

## 2020-01-12 DIAGNOSIS — E1169 Type 2 diabetes mellitus with other specified complication: Secondary | ICD-10-CM

## 2020-01-12 DIAGNOSIS — I6930 Unspecified sequelae of cerebral infarction: Secondary | ICD-10-CM

## 2020-01-12 DIAGNOSIS — Z8673 Personal history of transient ischemic attack (TIA), and cerebral infarction without residual deficits: Secondary | ICD-10-CM

## 2020-01-12 DIAGNOSIS — E785 Hyperlipidemia, unspecified: Secondary | ICD-10-CM | POA: Diagnosis not present

## 2020-01-12 DIAGNOSIS — E0800 Diabetes mellitus due to underlying condition with hyperosmolarity without nonketotic hyperglycemic-hyperosmolar coma (NKHHC): Secondary | ICD-10-CM

## 2020-01-12 DIAGNOSIS — Z794 Long term (current) use of insulin: Secondary | ICD-10-CM

## 2020-01-12 DIAGNOSIS — I10 Essential (primary) hypertension: Secondary | ICD-10-CM

## 2020-01-12 NOTE — Chronic Care Management (AMB) (Signed)
Chronic Care Management    Clinical Social Work Follow Up Note  01/12/2020 Name: CURTIS CAIN MRN: [id number] DOB: 09/23/1935  LONNIE RETH is a 84 y.o. year old male who is a primary care patient of [name], [name], MD. The CCM team was consulted for assistance with [name] .   Review of patient status, including review of consultants reports, other relevant assessments, and collaboration with appropriate care team members and the patient's provider was performed as part of comprehensive patient evaluation and provision of chronic care management services.    SDOH (Social Determinants of Health) assessments performed: No; risk for tobacco use; risk for depression; risk for social isolation; risk for stress    Chronic Care Management from 10/07/2018 in [facility]  PHQ-9 Total Score 7       GAD 7 : Generalized Anxiety Score 10/07/2018  Nervous, Anxious, on Edge 1  Control/stop worrying 0  Worry too much - different things 1  Trouble relaxing 1  Restless 1  Easily annoyed or irritable 0  Afraid - awful might happen 0  Total GAD 7 Score 4  Anxiety Difficulty Somewhat difficult    Outpatient Encounter Medications as of 01/12/2020  Medication Sig   ACCU-CHEK SOFTCLIX LANCETS lancets Check glucose twice a day   albuterol (PROVENTIL HFA;VENTOLIN HFA) 108 (90 Base) MCG/ACT inhaler Inhale 2 puffs into the lungs every 4 (four) hours as needed for wheezing or shortness of breath.   albuterol (PROVENTIL) (2.5 MG/3ML) 0.083% nebulizer solution Take 3 mLs (2.5 mg total) by nebulization every 6 (six) hours as needed for wheezing or shortness of breath.   aspirin EC 81 MG tablet Take 162 mg by mouth daily.    atorvastatin (LIPITOR) 40 MG tablet Take 1 tablet (40 mg total) by mouth daily.   Blood Glucose Calibration (ACCU-CHEK AVIVA) SOLN Use with glucometer   blood glucose meter kit and supplies KIT Daily. Onetouch Verio   Blood Glucose Monitoring Suppl  (ACCU-CHEK AVIVA PLUS) w/Device KIT Check glucose twice a day   diclofenac Sodium (VOLTAREN) 1 % GEL Apply 2 g topically 4 (four) times daily. (Patient not taking: Reported on 01/05/2020)   donepezil ([name]) 10 MG tablet Take 1 tablet (10 mg total) by mouth at bedtime.   fluticasone furoate-vilanterol (BREO ELLIPTA) 100-25 MCG/INH AEPB Inhale 1 puff into the lungs daily.   glucose blood (ACCU-CHEK AVIVA PLUS) test strip Check glucose twice a day   Insulin Pen Needle (PEN NEEDLES) 32G X 4 MM MISC 1 each by Does not apply route 2 (two) times daily.   losartan (COZAAR) 50 MG tablet Take 1 tablet (50 mg total) by mouth daily.   memantine (NAMENDA) 10 MG tablet Take 1 tablet (10 mg total) by mouth 2 (two) times daily.   Nebulizers (COMP AIR COMPRESSOR NEBULIZER) MISC    TRESIBA FLEXTOUCH 100 UNIT/ML FlexTouch Pen Inject 0.2 mLs (20 Units total) into the skin daily at 10 pm.   No facility-administered encounter medications on file as of 01/12/2020.    Goals      Client wants to talk with someone about anxiety or depression management (pt-stated)      Current Barriers:   Medical issues management in client with Chronic diagnoses of HLD, DM, HTN, Hx TIA, DM, Dementia  Anxiety issues  Clinical Social Work Clinical Goal(s):  Over the next 30 days, client/spouse will work with LCSW to address concerns related to management of anxiety symptoms of client. Interventions:  Talked with [name]  [name], spouse of client , about client use of relaxation techniques (taking walk,working in [place] outdoors)  Talked with [name] about family support for client  Encouraged client/spouse to talk with RN CM [name] about nursing needs of client  Talked with [name] about client completion of daily ADLs for client  Talked with [name] about transport needs of client  Talked with [name] about appetite of client  Talked with [name] about weight loss of client  Talked with [name] about  wandering of client   Talked with [name] about pain issues of client  Talked with [name] about client's upcoming appointments  Talked with [name] about safety concerns for client (she is becoming concerned regarding client wandering behaviors)  Collaborated with RNCM about nursing needs of client  Patient Self Care Activities:   Attends all scheduled provider appointments  Performs ADL's independently  Calls provider office for new concerns or questions   Plan: Client/spouse to talk with RN CM to discuss nursing needs of client LCSW to call client/spouse in next 4 weeks to discuss client management of anxiety symptoms Client to attend scheduled client medical appointments Client to use relaxation techniques to help manage anxiety symptoms.  Initial goal documentation   Follow Up Plan: LCSW to call client or spouse of client in next 4 weeks to talk with client or spouse about client management of anxiety symptoms  [name].Alexandra Lipps MSW, LCSW Licensed Clinical Social Worker [facility] Care Management [phone]

## 2020-01-12 NOTE — Chronic Care Management (AMB) (Signed)
  Chronic Care Management   Care Coordination Note  01/12/2020 Name: [name] MRN: [id number] DOB: [birth year]  I was consulted by Theadore Nan, LCSW regarding [name]'s decreased appetite and weight loss. Wife reports that patient is not eating as he used to. He no longer enjoys eating his favorite foods and prefers sweets/candy. She advised LCSW that [name] has lost an estimated 15-20 lbs in the past 4 to 5 months. He had an in person follow-up visit with Dr Livia Snellen on 01/05/20.   Chart reviewed and patients most recent in-office weights are as follows:  Wt Readings from Last 3 Encounters:  01/05/20 195 lb 3.2 oz (88.5 kg)  05/22/19 200 lb (90.7 kg)  12/26/18 212 lb (96.2 kg)    Follow up plan: RN will collaborate with PCP regarding wife's concerns about weight loss Appt with Dr Livia Snellen on 04/06/20 Initial RN visit with Weeki [place] on 02/15/20 (can possible schedule sooner if needed)  [name], BSN, RN-BC [place] / [place] [facility] Direct Dial: [phone]

## 2020-01-12 NOTE — Patient Instructions (Addendum)
Licensed Clinical Social Worker Visit Information  Goals we discussed today:       Client wants to talk with someone about anxiety or depression management (pt-stated)       Current Barriers:   Medical issues management in client with Chronic diagnoses of HLD, DM, HTN, Hx TIA, DM, Dementia  Anxiety issues  Clinical Social Work Clinical Goal(s):  Over the next 30 days, client/spouse will work with LCSW to address concerns related to management of anxiety symptoms of client. Interventions:  Talked with [name], spouse of client , about client use of relaxation techniques (taking walk,working in [place] outdoors)  Talked with [name] about family support for client  Encouraged client/spouse to talk with RN CM [name] about nursing needs of client  Talked with [name] about client completion of daily ADLs for client  Talked with [name] about transport needs of client  Talked with [name] about appetite of client  Talked with [name] about weight loss of client  Talked with [name] about wandering of client   Talked with [name] about pain issues of client  Talked with [name] about client's upcoming appointments  Talked with [name] about safety concerns for client (she is becoming concerned regarding client wandering behaviors)  Collaborated with RNCM about nursing needs of client  Patient Self Care Activities:   Attends all scheduled provider appointments  Performs ADL's independently  Calls provider office for new concerns or questions   Plan: Client/spouse to talk with RN CM to discuss nursing needs of client LCSW to call client/spouse in next 4 weeks to discuss client management of anxiety symptoms Client to attend scheduled client medical appointments Client to use relaxation techniques to help manage anxiety symptoms.  Initial goal documentation   Follow Up Plan: LCSW to call client or spouse of client in next 4 weeks to talk with client or  spouse about client management of anxiety symptoms  Materials Provided: No  The patient/[name], spouse, verbalized understanding of instructions provided today and declined a print copy of patient instruction materials.   [name].[name] MSW, LCSW Licensed Clinical Social Worker [facility] Care Management [phone]

## 2020-01-27 ENCOUNTER — Encounter: Payer: Self-pay | Admitting: *Deleted

## 2020-02-11 ENCOUNTER — Ambulatory Visit (INDEPENDENT_AMBULATORY_CARE_PROVIDER_SITE_OTHER): Payer: Medicare HMO | Admitting: Licensed Clinical Social Worker

## 2020-02-11 DIAGNOSIS — Z794 Long term (current) use of insulin: Secondary | ICD-10-CM | POA: Diagnosis not present

## 2020-02-11 DIAGNOSIS — E1159 Type 2 diabetes mellitus with other circulatory complications: Secondary | ICD-10-CM

## 2020-02-11 DIAGNOSIS — E785 Hyperlipidemia, unspecified: Secondary | ICD-10-CM | POA: Diagnosis not present

## 2020-02-11 DIAGNOSIS — I10 Essential (primary) hypertension: Secondary | ICD-10-CM

## 2020-02-11 DIAGNOSIS — E1169 Type 2 diabetes mellitus with other specified complication: Secondary | ICD-10-CM | POA: Diagnosis not present

## 2020-02-11 DIAGNOSIS — Z8673 Personal history of transient ischemic attack (TIA), and cerebral infarction without residual deficits: Secondary | ICD-10-CM

## 2020-02-11 NOTE — Patient Instructions (Addendum)
Licensed Clinical Social Worker Visit Information  Goals we discussed today:     .  Client wants to talk with someone about anxiety or depression management (pt-stated)        Current Barriers:   Medical issues management in client with Chronic diagnoses of HLD, DM, HTN, Hx TIA, DM, Dementia  Anxiety issues  Clinical Social Work Clinical Goal(s):  Over the next 30 days, client/spouse will work with LCSW to address concerns related to management of anxiety symptoms of client. Interventions:  Encouraged client/spouse to talk with RN CM [name] about nursing needs of client  Talked with client/spouse about client completion of daily ADLs for client  Talked with [name] about appetite of client  Talked with [name] about weight of client ( she said that she thinks client is eating a little better the last few weeks)  Talked with [name] about relaxation techniques of client (client likes to sit outdoors or walk outdoors)  Talked with [name] about sleeping issues of client  Talked with [name] about client's upcoming medical appointments  Talked with [name] about mood of client [name] said client may become angry occasionally in the afternoon)  Talked with [name] about vision of client  Collaborated with RNCM regarding nursing needs of client  Patient Self Care Activities:   Attends all scheduled provider appointments  Performs ADL's independently  Calls provider office for new concerns or questions   Plan: Client/spouse to talk with RN CM to discuss nursing needs of client LCSW to call client/spouse in next 4 weeks to discuss client management of anxiety symptoms Client to attend scheduled client medical appointments Client to use relaxation techniques to help manage anxiety symptoms.  Initial goal documentation    Follow Up Plan: LCSW to call client or spouse of client in next 4 weeks to talk with client or spouse about client management of anxiety  symptoms  Materials Provided: No  The patient/[name] Paolo, spouse of patient, verbalized understanding of instructions provided today and declined a print copy of patient instruction materials.   [name].Cindy Brindisi MSW, LCSW Licensed Clinical Social Worker [facility] Care Management [phone]

## 2020-02-11 NOTE — Chronic Care Management (AMB) (Signed)
Chronic Care Management    Clinical Social Work Follow Up Note  02/11/2020 Name: [name] MRN: [id number] DOB: 1936/03/04  [name] is a 84 y.o. year old male who is a primary care patient of [name], [name], MD. The CCM team was consulted for assistance with [name] .   Review of patient status, including review of consultants reports, other relevant assessments, and collaboration with appropriate care team members and the patient's provider was performed as part of comprehensive patient evaluation and provision of chronic care management services.    SDOH (Social Determinants of Health) assessments performed: No;risk for depression; risk for tobacco use; risk for physical inactivity; risk for stress    Chronic Care Management from 10/07/2018 in [facility]  PHQ-9 Total Score 7     GAD 7 : Generalized Anxiety Score 10/07/2018  Nervous, Anxious, on Edge 1  Control/stop worrying 0  Worry too much - different things 1  Trouble relaxing 1  Restless 1  Easily annoyed or irritable 0  Afraid - awful might happen 0  Total GAD 7 Score 4  Anxiety Difficulty Somewhat difficult    Outpatient Encounter Medications as of 02/11/2020  Medication Sig  . ACCU-CHEK SOFTCLIX LANCETS lancets Check glucose twice a day  . albuterol (PROVENTIL HFA;VENTOLIN HFA) 108 (90 Base) MCG/ACT inhaler Inhale 2 puffs into the lungs every 4 (four) hours as needed for wheezing or shortness of breath.  [name] Kitchen albuterol (PROVENTIL) (2.5 MG/3ML) 0.083% nebulizer solution Take 3 mLs (2.5 mg total) by nebulization every 6 (six) hours as needed for wheezing or shortness of breath.  [name] Kitchen aspirin EC 81 MG tablet Take 162 mg by mouth daily.   [name] Kitchen atorvastatin (LIPITOR) 40 MG tablet Take 1 tablet (40 mg total) by mouth daily.  . Blood Glucose Calibration (ACCU-CHEK AVIVA) SOLN Use with glucometer  . blood glucose meter kit and supplies KIT Daily. Onetouch Verio  . Blood Glucose Monitoring Suppl  (ACCU-CHEK AVIVA PLUS) w/Device KIT Check glucose twice a day  . diclofenac Sodium (VOLTAREN) 1 % GEL Apply 2 g topically 4 (four) times daily. (Patient not taking: Reported on 01/05/2020)  . donepezil ([name]) 10 MG tablet Take 1 tablet (10 mg total) by mouth at bedtime.  . fluticasone furoate-vilanterol (BREO ELLIPTA) 100-25 MCG/INH AEPB Inhale 1 puff into the lungs daily.  [name] Kitchen glucose blood (ACCU-CHEK AVIVA PLUS) test strip Check glucose twice a day  . Insulin Pen Needle (PEN NEEDLES) 32G X 4 MM MISC 1 each by Does not apply route 2 (two) times daily.  [name] Kitchen losartan (COZAAR) 50 MG tablet Take 1 tablet (50 mg total) by mouth daily.  . memantine (NAMENDA) 10 MG tablet Take 1 tablet (10 mg total) by mouth 2 (two) times daily.  . Nebulizers (COMP AIR COMPRESSOR NEBULIZER) MISC   . TRESIBA FLEXTOUCH 100 UNIT/ML FlexTouch Pen Inject 0.2 mLs (20 Units total) into the skin daily at 10 pm.   No facility-administered encounter medications on file as of 02/11/2020.    Goals    .  Client wants to talk with someone about anxiety or depression management (pt-stated)      Current Barriers:  [name] Kitchen Medical issues management in client with Chronic diagnoses of HLD, DM, HTN, Hx TIA, DM, Dementia . Anxiety issues  Clinical Social Work Clinical Goal(s):  Over the next 30 days, client/spouse will work with LCSW to address concerns related to management of anxiety symptoms of client. Interventions: . Encouraged client/spouse to talk with RN  CM [name] about nursing needs of client . Talked with client/spouse about client completion of daily ADLs for client . Talked with [name] about appetite of client . Talked with [name] about weight of client ( she said that she thinks client is eating a little better the last few weeks) . Talks with client about relaxation techniques of client (client likes to sit outdoors or walk outdoors) . Talked with [name] about sleeping issues of client . Talked with [name] about client's  upcoming medical appointments . Talked with [name] about mood of client [name] said client may become angry occasionally in the afternoon) . Talked with [name] about vision of client . Collaborated with RNCM regarding nursing needs of client  Patient Self Care Activities:  . Attends all scheduled provider appointments . Performs ADL's independently . Calls provider office for new concerns or questions   Plan: Client/spouse to talk with RN CM to discuss nursing needs of client LCSW to call client/spouse in next 4 weeks to discuss client management of anxiety symptoms Client to attend scheduled client medical appointments Client to use relaxation techniques to help manage anxiety symptoms.  Initial goal documentation    Follow Up Plan: LCSW to call client or spouse of client in next 4 weeks to talk with client or spouse about client management of anxiety symptoms  [name].Danice Dippolito MSW, LCSW Licensed Clinical Social Worker [facility] Care Management [phone]

## 2020-02-15 ENCOUNTER — Telehealth: Payer: Medicare HMO | Admitting: *Deleted

## 2020-02-15 ENCOUNTER — Telehealth: Payer: Self-pay | Admitting: *Deleted

## 2020-02-15 NOTE — Telephone Encounter (Signed)
  Chronic Care Management   Outreach Note  02/15/2020 Name: [name] MRN: [id number] DOB: 01-03-1936  Referred by: Claretta Fraise, MD Reason for referral : Chronic Care Management (Initial RN Visit)   An unsuccessful Initial Telephone outreach was attempted today. The patient was referred to the case management team for assistance with care management and care coordination.   Clinical Goals: . Over the next 10 days, patient will be contacted by a Care Guide to reschedule their Initial CCM Visit . Over the next 30 days, patient will have an Initial CCM Visit with a member of the embedded CCM team to discuss self-management of their chronic medical conditions  Interventions and Plan . Chart reviewed in preparation for initial visit telephone call . Collaboration with other care team members as needed . Patient is engaged with LCSW already . Unsuccessful outreach to patient  . A HIPAA compliant phone message was left for the patient providing contact information and requesting a return call.  . Request sent to care guides to reach out and reschedule patient's initial visit with RN Care Manager.    [name], BSN, RN-BC Embedded Chronic Care Manager [facility] / [place] [facility] Direct Dial: [phone]

## 2020-02-23 NOTE — Telephone Encounter (Signed)
Pt has been scheduled for 03/16/2020

## 2020-03-16 ENCOUNTER — Telehealth: Payer: Self-pay | Admitting: *Deleted

## 2020-03-16 ENCOUNTER — Telehealth: Payer: Medicare HMO | Admitting: *Deleted

## 2020-03-17 NOTE — Telephone Encounter (Signed)
  Chronic Care Management   Outreach Note  03/16/2020 Name: [name] MRN: [id number] DOB: 07-Mar-1936  Referred by: Claretta Fraise, MD Reason for referral : Chronic Care Management (Initial Visit)   An unsuccessful Initial Telephone Visit was attempted today. The patient was referred to the case management team for assistance with care management and care coordination.   Clinical Goals: . Over the next 10 days, patient will be contacted by a Care Guide to reschedule their Initial CCM Visit . Over the next 30 days, patient will have an Initial CCM Visit with a member of the embedded CCM team to discuss self-management of their chronic medical conditions  Interventions and Plan . Chart reviewed in preparation for initial visit telephone call . Collaboration with other care team members as needed . Unsuccessful outreach to patient  . A HIPAA compliant phone message was left for the patient providing contact information and requesting a return call.  . Request sent to care guides to reach out and reschedule patient's initial visit   [name], BSN, RN-BC [place] / [place] [facility] Direct Dial: [phone]

## 2020-03-18 ENCOUNTER — Telehealth: Payer: Medicare HMO

## 2020-04-06 ENCOUNTER — Ambulatory Visit: Payer: Medicare HMO | Admitting: Family Medicine

## 2020-04-06 ENCOUNTER — Telehealth: Payer: Self-pay | Admitting: Family Medicine

## 2020-04-06 NOTE — Telephone Encounter (Signed)
Appointment rescheduled. Patient aware.

## 2020-04-12 ENCOUNTER — Other Ambulatory Visit: Payer: Self-pay

## 2020-04-12 ENCOUNTER — Ambulatory Visit (INDEPENDENT_AMBULATORY_CARE_PROVIDER_SITE_OTHER): Payer: Medicare HMO | Admitting: Family Medicine

## 2020-04-12 ENCOUNTER — Encounter: Payer: Self-pay | Admitting: Family Medicine

## 2020-04-12 VITALS — BP 127/71 | HR 75 | Temp 97.4°F | Resp 20 | Ht 72.0 in | Wt 195.0 lb

## 2020-04-12 DIAGNOSIS — E785 Hyperlipidemia, unspecified: Secondary | ICD-10-CM

## 2020-04-12 DIAGNOSIS — Z794 Long term (current) use of insulin: Secondary | ICD-10-CM | POA: Diagnosis not present

## 2020-04-12 DIAGNOSIS — J4521 Mild intermittent asthma with (acute) exacerbation: Secondary | ICD-10-CM | POA: Diagnosis not present

## 2020-04-12 DIAGNOSIS — J449 Chronic obstructive pulmonary disease, unspecified: Secondary | ICD-10-CM | POA: Diagnosis not present

## 2020-04-12 DIAGNOSIS — F0150 Vascular dementia without behavioral disturbance: Secondary | ICD-10-CM | POA: Diagnosis not present

## 2020-04-12 DIAGNOSIS — Z23 Encounter for immunization: Secondary | ICD-10-CM

## 2020-04-12 DIAGNOSIS — E1159 Type 2 diabetes mellitus with other circulatory complications: Secondary | ICD-10-CM | POA: Diagnosis not present

## 2020-04-12 DIAGNOSIS — N529 Male erectile dysfunction, unspecified: Secondary | ICD-10-CM | POA: Diagnosis not present

## 2020-04-12 DIAGNOSIS — I152 Hypertension secondary to endocrine disorders: Secondary | ICD-10-CM | POA: Diagnosis not present

## 2020-04-12 DIAGNOSIS — E1169 Type 2 diabetes mellitus with other specified complication: Secondary | ICD-10-CM | POA: Diagnosis not present

## 2020-04-12 LAB — BAYER DCA HB A1C WAIVED: HB A1C (BAYER DCA - WAIVED): 7 % — ABNORMAL HIGH (ref <7.0)

## 2020-04-12 MED ORDER — SILDENAFIL CITRATE 20 MG PO TABS: ORAL_TABLET | ORAL | 5 refills | Status: DC

## 2020-04-12 MED ORDER — LOSARTAN POTASSIUM 50 MG PO TABS: 50.0000 mg | ORAL_TABLET | Freq: Every day | ORAL | 1 refills | Status: DC

## 2020-04-12 MED ORDER — ALBUTEROL SULFATE HFA 108 (90 BASE) MCG/ACT IN AERS: 2.0000 | INHALATION_SPRAY | RESPIRATORY_TRACT | 5 refills | Status: DC | PRN

## 2020-04-12 MED ORDER — DONEPEZIL HCL 10 MG PO TABS: 10.0000 mg | ORAL_TABLET | Freq: Every day | ORAL | 1 refills | Status: DC

## 2020-04-12 MED ORDER — BREO ELLIPTA 100-25 MCG/INH IN AEPB: 1.0000 | INHALATION_SPRAY | Freq: Every day | RESPIRATORY_TRACT | 5 refills | Status: DC

## 2020-04-12 NOTE — Progress Notes (Signed)
Subjective:  Patient ID: [name],  male    DOB: September 12, 1935  Age: 84 y.o.    CC: Medical Management of Chronic Issues   HPI SHYLOH KRINKE presents for  follow-up of hypertension. Patient has no history of headache chest pain or shortness of breath or recent cough. Patient also denies symptoms of TIA such as numbness weakness lateralizing. Patient denies side effects from medication. States taking it regularly.  Patient also  in for follow-up of elevated cholesterol. Doing well without complaints on current medication. Denies side effects  including myalgia and arthralgia and nausea. Also in today for liver function testing. Currently no chest pain, shortness of breath or other cardiovascular related symptoms noted.  Follow-up of diabetes.Patient denies symptoms such as excessive hunger or urinary frequency, excessive hunger, nausea No significant hypoglycemic spells noted. Medications reviewed. Pt reports taking them regularly. Pt. denies complication/adverse reaction today.    History Vibhav has a past medical history of Anxiety, Asthma, Choledocholithiasis (02/24/2013), COPD (chronic obstructive pulmonary disease) ([place]), CVA (cerebral vascular accident) ([place]) (2013), Dementia ([place]), Diabetes mellitus, TIA (transient ischemic attack), Hyperlipidemia, and Hypertension.   He has a past surgical history that includes Rotator cuff repair (Right); Cholecystectomy (N/A, 02/23/2013); ERCP (N/A, 02/24/2013); and Cataract extraction w/PHACO (Left, 04/30/2016).   His family history includes Diabetes in his sister and sister; Emphysema in his brother; Heart disease in his brother; Hypertension in his father and mother.He reports that he has never smoked. He has never used smokeless tobacco. He reports that he does not drink alcohol and does not use drugs.  Current Outpatient Medications on File Prior to Visit  Medication Sig Dispense Refill  . ACCU-CHEK SOFTCLIX LANCETS lancets Check glucose  twice a day 200 each 3  . aspirin EC 81 MG tablet Take 162 mg by mouth daily.     [name] Kitchen atorvastatin (LIPITOR) 40 MG tablet Take 1 tablet (40 mg total) by mouth daily. 90 tablet 1  . Blood Glucose Calibration (ACCU-CHEK AVIVA) SOLN Use with glucometer 1 each 0  . glucose blood (ACCU-CHEK AVIVA PLUS) test strip Check glucose twice a day 200 each 3  . Insulin Pen Needle (PEN NEEDLES) 32G X 4 MM MISC 1 each by Does not apply route 2 (two) times daily. 90 each 3  . memantine (NAMENDA) 10 MG tablet Take 1 tablet (10 mg total) by mouth 2 (two) times daily. 180 tablet 3  . TRESIBA FLEXTOUCH 100 UNIT/ML FlexTouch Pen Inject 0.2 mLs (20 Units total) into the skin daily at 10 pm. 5 pen 3  . diclofenac Sodium (VOLTAREN) 1 % GEL Apply 2 g topically 4 (four) times daily. (Patient not taking: Reported on 01/05/2020) 350 g 1   No current facility-administered medications on file prior to visit.    ROS Review of Systems  Constitutional: Negative.   HENT: Negative.   Eyes: Negative for visual disturbance.  Respiratory: Positive for cough (rattling for a couple of months. Off of allinhalers). Negative for shortness of breath.   Cardiovascular: Negative for chest pain and leg swelling.  Gastrointestinal: Negative for abdominal pain, diarrhea, nausea and vomiting.  Genitourinary: Negative for difficulty urinating.       Erectile dysfunction   Musculoskeletal: Negative for arthralgias and myalgias.  Skin: Negative for rash.  Neurological: Negative for headaches.  Psychiatric/Behavioral: Negative for sleep disturbance.    Objective:  BP 127/71   Pulse 75   Temp (!) 97.4 F (36.3 C) (Temporal)   Resp 20   Ht  6' (1.829 m)   Wt 195 lb (88.5 kg)   SpO2 98%   BMI 26.45 kg/m   BP Readings from Last 3 Encounters:  04/12/20 127/71  01/05/20 130/74  05/22/19 121/78    Wt Readings from Last 3 Encounters:  04/12/20 195 lb (88.5 kg)  01/05/20 195 lb 3.2 oz (88.5 kg)  05/22/19 200 lb (90.7 kg)      Physical Exam Vitals reviewed.  Constitutional:      Appearance: He is well-developed.  HENT:     Head: Normocephalic and atraumatic.     Right Ear: Tympanic membrane and external ear normal. No decreased hearing noted.     Left Ear: Tympanic membrane and external ear normal. No decreased hearing noted.     Mouth/Throat:     Pharynx: No oropharyngeal exudate or posterior oropharyngeal erythema.  Eyes:     Pupils: Pupils are equal, round, and reactive to light.  Cardiovascular:     Rate and Rhythm: Normal rate and regular rhythm.     Heart sounds: No murmur heard.   Pulmonary:     Effort: No respiratory distress.     Breath sounds: Normal breath sounds.  Abdominal:     General: Bowel sounds are normal.     Palpations: Abdomen is soft. There is no mass.     Tenderness: There is no abdominal tenderness.  Musculoskeletal:     Cervical back: Normal range of motion and neck supple.     Diabetic Foot Exam - Simple   Simple Foot Form Visual Inspection No deformities, no ulcerations, no other skin breakdown bilaterally: Yes Sensation Testing Intact to touch and monofilament testing bilaterally: Yes Pulse Check Posterior Tibialis and Dorsalis pulse intact bilaterally: Yes Comments       Assessment & Plan:   Giuseppe was seen today for medical management of chronic issues.  Diagnoses and all orders for this visit:  Type 2 diabetes mellitus with other specified complication, with long-term current use of insulin (HCC) -     Microalbumin / creatinine urine ratio -     Bayer DCA Hb A1c Waived -     CBC with Differential/Platelet -     CMP14+EGFR -     Lipid panel  Hypertension associated with type 2 diabetes mellitus (HCC) -     Microalbumin / creatinine urine ratio -     Bayer DCA Hb A1c Waived -     CBC with Differential/Platelet -     CMP14+EGFR -     Lipid panel -     losartan (COZAAR) 50 MG tablet; Take 1 tablet (50 mg total) by mouth daily.  Hyperlipidemia  associated with type 2 diabetes mellitus (HCC) -     Microalbumin / creatinine urine ratio -     Bayer DCA Hb A1c Waived -     CBC with Differential/Platelet -     CMP14+EGFR -     Lipid panel  Need for immunization against influenza -     Flu Vaccine QUAD High Dose(Fluad)  Vascular dementia without behavioral disturbance (HCC) -     donepezil ([name]) 10 MG tablet; Take 1 tablet (10 mg total) by mouth at bedtime.  Vasculogenic erectile dysfunction, unspecified vasculogenic erectile dysfunction type -     sildenafil (REVATIO) 20 MG tablet; Take 2-5 pills at once, orally, with each sexual encounter  Mild intermittent asthma with acute exacerbation -     albuterol (VENTOLIN HFA) 108 (90 Base) MCG/ACT inhaler; Inhale 2 puffs into the  lungs every 4 (four) hours as needed for wheezing or shortness of breath. -     fluticasone furoate-vilanterol (BREO ELLIPTA) 100-25 MCG/INH AEPB; Inhale 1 puff into the lungs daily.   I have discontinued Thayer Jew L. Eberlin's albuterol, blood glucose meter kit and supplies, Accu-Chek Aviva Plus, and Surveyor, quantity. I have also changed his albuterol. Additionally, I am having him start on sildenafil. Lastly, I am having him maintain his aspirin EC, Pen Needles, glucose blood, Accu-Chek Aviva, Accu-Chek Softclix Lancets, diclofenac Sodium, memantine, Tresiba FlexTouch, atorvastatin, donepezil, losartan, and Breo Ellipta.  Meds ordered this encounter  Medications  . sildenafil (REVATIO) 20 MG tablet    Sig: Take 2-5 pills at once, orally, with each sexual encounter    Dispense:  50 tablet    Refill:  5  . donepezil ([name]) 10 MG tablet    Sig: Take 1 tablet (10 mg total) by mouth at bedtime.    Dispense:  90 tablet    Refill:  1  . losartan (COZAAR) 50 MG tablet    Sig: Take 1 tablet (50 mg total) by mouth daily.    Dispense:  90 tablet    Refill:  1  . albuterol (VENTOLIN HFA) 108 (90 Base) MCG/ACT inhaler    Sig: Inhale 2 puffs into the  lungs every 4 (four) hours as needed for wheezing or shortness of breath.    Dispense:  18 g    Refill:  5  . fluticasone furoate-vilanterol (BREO ELLIPTA) 100-25 MCG/INH AEPB    Sig: Inhale 1 puff into the lungs daily.    Dispense:  30 each    Refill:  5     Follow-up: Return in about 3 months (around 07/13/2020).  Claretta Fraise, M.D.

## 2020-04-13 LAB — CMP14+EGFR
ALT: 19 IU/L (ref 0–44)
AST: 17 IU/L (ref 0–40)
Albumin/Globulin Ratio: 1.4 (ref 1.2–2.2)
Albumin: 4 g/dL (ref 3.6–4.6)
Alkaline Phosphatase: 107 IU/L (ref 44–121)
BUN/Creatinine Ratio: 9 — ABNORMAL LOW (ref 10–24)
BUN: 15 mg/dL (ref 8–27)
Bilirubin Total: 0.4 mg/dL (ref 0.0–1.2)
CO2: 23 mmol/L (ref 20–29)
Calcium: 8.8 mg/dL (ref 8.6–10.2)
Chloride: 106 mmol/L (ref 96–106)
Creatinine, Ser: 1.66 mg/dL — ABNORMAL HIGH (ref 0.76–1.27)
GFR calc Af Amer: 44 mL/min/{1.73_m2} — ABNORMAL LOW (ref >59)
GFR calc non Af Amer: 38 mL/min/{1.73_m2} — ABNORMAL LOW (ref >59)
Globulin, Total: 2.8 g/dL (ref 1.5–4.5)
Glucose: 182 mg/dL — ABNORMAL HIGH (ref 65–99)
Potassium: 4.1 mmol/L (ref 3.5–5.2)
Sodium: 142 mmol/L (ref 134–144)
Total Protein: 6.8 g/dL (ref 6.0–8.5)

## 2020-04-13 LAB — CBC WITH DIFFERENTIAL/PLATELET
Basophils Absolute: 0.1 10*3/uL (ref 0.0–0.2)
Basos: 1 %
EOS (ABSOLUTE): 0.3 10*3/uL (ref 0.0–0.4)
Eos: 3 %
Hematocrit: 39.5 % (ref 37.5–51.0)
Hemoglobin: 13.4 g/dL (ref 13.0–17.7)
Immature Grans (Abs): 0.1 10*3/uL (ref 0.0–0.1)
Immature Granulocytes: 1 %
Lymphocytes Absolute: 2.4 10*3/uL (ref 0.7–3.1)
Lymphs: 31 %
MCH: 30.5 pg (ref 26.6–33.0)
MCHC: 33.9 g/dL (ref 31.5–35.7)
MCV: 90 fL (ref 79–97)
Monocytes Absolute: 0.4 10*3/uL (ref 0.1–0.9)
Monocytes: 6 %
Neutrophils Absolute: 4.7 10*3/uL (ref 1.4–7.0)
Neutrophils: 58 %
Platelets: 178 10*3/uL (ref 150–450)
RBC: 4.4 x10E6/uL (ref 4.14–5.80)
RDW: 12.5 % (ref 11.6–15.4)
WBC: 7.8 10*3/uL (ref 3.4–10.8)

## 2020-04-13 LAB — LIPID PANEL
Chol/HDL Ratio: 3.9 ratio (ref 0.0–5.0)
Cholesterol, Total: 121 mg/dL (ref 100–199)
HDL: 31 mg/dL — ABNORMAL LOW (ref >39)
LDL Chol Calc (NIH): 49 mg/dL (ref 0–99)
Triglycerides: 263 mg/dL — ABNORMAL HIGH (ref 0–149)
VLDL Cholesterol Cal: 41 mg/dL — ABNORMAL HIGH (ref 5–40)

## 2020-04-14 ENCOUNTER — Telehealth: Payer: Self-pay | Admitting: *Deleted

## 2020-04-14 NOTE — Telephone Encounter (Signed)
Prior Auth for Sildenafil 20mg -In Process   Key: [id number] -   PA Case ID: [id number]   Your information has been submitted to [facility]. [employer] will review the request and will issue a decision, typically within 3-7 days from your submission. You can check the updated outcome later by reopening this request.  If [employer] has not responded in 3-7 days or if you have any questions about your ePA request, please contact [employer] at [phone]. If you think there may be a problem with your PA request, use our live chat feature at the bottom right.

## 2020-04-15 NOTE — Telephone Encounter (Signed)
Prior Auth for Sildenafil 20mg -DENIED   Key: [id number] -   PA Case ID: [id number]    The Medicare rule in the Prescription Drug Manual (Chapter 6, Section 20.1) says drugs used for the treatment and maintenance of erectile dysfunction (ED) are excluded from Medicare Part D coverage. [employer] follows Medicare rules. Your drug is being used for the inability to develop or maintain an erection during sexual activity and per Medicare rules is not covered

## 2020-04-17 NOTE — Progress Notes (Signed)
Hello Neco,  Your lab result is normal and/or stable.Some minor variations that are not significant are commonly marked abnormal, but do not represent any medical problem for you.  Best regards, Arjan Strohm, M.D.

## 2020-04-22 ENCOUNTER — Ambulatory Visit: Payer: Medicare HMO | Admitting: Licensed Clinical Social Worker

## 2020-04-22 DIAGNOSIS — E1169 Type 2 diabetes mellitus with other specified complication: Secondary | ICD-10-CM

## 2020-04-22 DIAGNOSIS — E1159 Type 2 diabetes mellitus with other circulatory complications: Secondary | ICD-10-CM

## 2020-04-22 DIAGNOSIS — Z8673 Personal history of transient ischemic attack (TIA), and cerebral infarction without residual deficits: Secondary | ICD-10-CM

## 2020-04-22 DIAGNOSIS — E785 Hyperlipidemia, unspecified: Secondary | ICD-10-CM

## 2020-04-22 DIAGNOSIS — I152 Hypertension secondary to endocrine disorders: Secondary | ICD-10-CM

## 2020-04-22 DIAGNOSIS — F0150 Vascular dementia without behavioral disturbance: Secondary | ICD-10-CM

## 2020-04-22 NOTE — Chronic Care Management (AMB) (Signed)
  Chronic Care Management    Clinical Social Work Follow Up Note  04/22/2020 Name: [name] MRN: [id number] DOB: Nov 28, 1935  [name] is a 84 y.o. year old male who is a primary care patient of [name], [name], MD. The CCM team was consulted for assistance with [name] .   Review of patient status, including review of consultants reports, other relevant assessments, and collaboration with appropriate care team members and the patient's provider was performed as part of comprehensive patient evaluation and provision of chronic care management services.    SDOH (Social Determinants of Health) assessments performed: No;risk for depression; risk for tobacco use;;risk of stress; risk of physical inactivity    Chronic Care Management from 10/07/2018 in [facility]  PHQ-9 Total Score 7      GAD 7 : Generalized Anxiety Score 10/07/2018  Nervous, Anxious, on Edge 1  Control/stop worrying 0  Worry too much - different things 1  Trouble relaxing 1  Restless 1  Easily annoyed or irritable 0  Afraid - awful might happen 0  Total GAD 7 Score 4  Anxiety Difficulty Somewhat difficult    Outpatient Encounter Medications as of 04/22/2020  Medication Sig  . ACCU-CHEK SOFTCLIX LANCETS lancets Check glucose twice a day  . albuterol (VENTOLIN HFA) 108 (90 Base) MCG/ACT inhaler Inhale 2 puffs into the lungs every 4 (four) hours as needed for wheezing or shortness of breath.  [name] Kitchen aspirin EC 81 MG tablet Take 162 mg by mouth daily.   [name] Kitchen atorvastatin (LIPITOR) 40 MG tablet Take 1 tablet (40 mg total) by mouth daily.  . Blood Glucose Calibration (ACCU-CHEK AVIVA) SOLN Use with glucometer  . diclofenac Sodium (VOLTAREN) 1 % GEL Apply 2 g topically 4 (four) times daily. (Patient not taking: Reported on 01/05/2020)  . donepezil ([name]) 10 MG tablet Take 1 tablet (10 mg total) by mouth at bedtime.  . fluticasone furoate-vilanterol (BREO ELLIPTA) 100-25 MCG/INH AEPB  Inhale 1 puff into the lungs daily.  [name] Kitchen glucose blood (ACCU-CHEK AVIVA PLUS) test strip Check glucose twice a day  . Insulin Pen Needle (PEN NEEDLES) 32G X 4 MM MISC 1 each by Does not apply route 2 (two) times daily.  [name] Kitchen losartan (COZAAR) 50 MG tablet Take 1 tablet (50 mg total) by mouth daily.  . memantine (NAMENDA) 10 MG tablet Take 1 tablet (10 mg total) by mouth 2 (two) times daily.  . sildenafil (REVATIO) 20 MG tablet Take 2-5 pills at once, orally, with each sexual encounter  . TRESIBA FLEXTOUCH 100 UNIT/ML FlexTouch Pen Inject 0.2 mLs (20 Units total) into the skin daily at 10 pm.   No facility-administered encounter medications on file as of 04/22/2020.    LCSW called client home phone number several times today to try to speak with client or his spouse via phone. LCSW was not able to speak via phone today with client or spouse of client. LCSW did leave phone message requesting client or spouse please return call to LCSW at [phone]  Follow Up Plan: LCSW to call client or spouse of client in next 4 weeks to talk with client or spouse about client management of anxiety symptoms  [name].Donta Mcinroy MSW, LCSW Licensed Clinical Social Worker [facility] Care Management [phone]

## 2020-04-22 NOTE — Patient Instructions (Addendum)
Licensed Clinical Social Worker Visit Information  Goals we discussed today:   04/22/2020  Name: [name] MRN: [id number]       DOB: 1935/10/20  [name] is a 84 y.o. year old male who is a primary care patient of [name], [name], MD. The CCM team was consulted for assistance with [name] .   Review of patient status, including review of consultants reports, other relevant assessments, and collaboration with appropriate care team members and the patient's provider was performed as part of comprehensive patient evaluation and provision of chronic care management services.    SDOH (Social Determinants of Health) assessments performed: No;risk for depression; risk for tobacco use;;risk of stress; risk of physical inactivity  LCSW called client home phone number several times today to try to speak with client or his spouse via phone. LCSW was not able to speak via phone today with client or spouse of client. LCSW did leave phone message requesting client or spouse please return call to LCSW at [phone]  Follow Up Plan: LCSW to call client or spouse of client in next 4 weeks to talk with client or spouse about client management of anxiety symptoms  Materials Provided: No  LCSW was not able to speak via phone today with client or spouse of client; thus the client or spouse of client were not able to verbalize understanding of instructions provided today and were not able to accept or decline a print copy of patient instruction materials.   [name].Emon Miggins MSW, LCSW Licensed Clinical Social Worker [facility] [phone]

## 2020-04-27 DIAGNOSIS — R0602 Shortness of breath: Secondary | ICD-10-CM | POA: Diagnosis not present

## 2020-04-27 DIAGNOSIS — R069 Unspecified abnormalities of breathing: Secondary | ICD-10-CM | POA: Diagnosis not present

## 2020-04-28 ENCOUNTER — Telehealth: Payer: Self-pay

## 2020-04-28 NOTE — Telephone Encounter (Signed)
  Prescription Request  04/28/2020  What is the name of the medication or equipment? Albuterol solution  Have you contacted your pharmacy to request a refill? (if applicable) no  Which pharmacy would you like this sent to? [facility]   Patient notified that their request is being sent to the clinical staff for review and that they should receive a response within 2 business days.

## 2020-04-28 NOTE — Telephone Encounter (Signed)
Wife aware that albuterol was sent to [facility] on 04/12/20.  Wife will contact pharmacy.

## 2020-04-29 ENCOUNTER — Encounter (HOSPITAL_COMMUNITY): Payer: Self-pay | Admitting: Emergency Medicine

## 2020-04-29 ENCOUNTER — Emergency Department (HOSPITAL_COMMUNITY): Payer: Medicare HMO

## 2020-04-29 ENCOUNTER — Other Ambulatory Visit: Payer: Self-pay

## 2020-04-29 ENCOUNTER — Emergency Department (HOSPITAL_COMMUNITY)
Admission: EM | Admit: 2020-04-29 | Discharge: 2020-04-29 | Disposition: A | Discharge status: Home / Self Care | Payer: Medicare HMO | Attending: Emergency Medicine | Admitting: Emergency Medicine

## 2020-04-29 DIAGNOSIS — J9811 Atelectasis: Secondary | ICD-10-CM | POA: Diagnosis not present

## 2020-04-29 DIAGNOSIS — E119 Type 2 diabetes mellitus without complications: Secondary | ICD-10-CM | POA: Insufficient documentation

## 2020-04-29 DIAGNOSIS — Z20822 Contact with and (suspected) exposure to covid-19: Secondary | ICD-10-CM | POA: Insufficient documentation

## 2020-04-29 DIAGNOSIS — Z7951 Long term (current) use of inhaled steroids: Secondary | ICD-10-CM | POA: Diagnosis not present

## 2020-04-29 DIAGNOSIS — Z794 Long term (current) use of insulin: Secondary | ICD-10-CM | POA: Diagnosis not present

## 2020-04-29 DIAGNOSIS — R059 Cough, unspecified: Secondary | ICD-10-CM | POA: Diagnosis not present

## 2020-04-29 DIAGNOSIS — E876 Hypokalemia: Secondary | ICD-10-CM | POA: Diagnosis not present

## 2020-04-29 DIAGNOSIS — J45901 Unspecified asthma with (acute) exacerbation: Secondary | ICD-10-CM

## 2020-04-29 DIAGNOSIS — I10 Essential (primary) hypertension: Secondary | ICD-10-CM | POA: Diagnosis not present

## 2020-04-29 DIAGNOSIS — Z79899 Other long term (current) drug therapy: Secondary | ICD-10-CM | POA: Diagnosis not present

## 2020-04-29 DIAGNOSIS — F0390 Unspecified dementia without behavioral disturbance: Secondary | ICD-10-CM | POA: Insufficient documentation

## 2020-04-29 LAB — BASIC METABOLIC PANEL
Anion gap: 11 (ref 5–15)
BUN: 11 mg/dL (ref 8–23)
CO2: 23 mmol/L (ref 22–32)
Calcium: 8.5 mg/dL — ABNORMAL LOW (ref 8.9–10.3)
Chloride: 106 mmol/L (ref 98–111)
Creatinine, Ser: 1.31 mg/dL — ABNORMAL HIGH (ref 0.61–1.24)
GFR, Estimated: 54 mL/min — ABNORMAL LOW (ref >60)
Glucose, Bld: 167 mg/dL — ABNORMAL HIGH (ref 70–99)
Potassium: 3 mmol/L — ABNORMAL LOW (ref 3.5–5.1)
Sodium: 140 mmol/L (ref 135–145)

## 2020-04-29 LAB — CBC
HCT: 36 % — ABNORMAL LOW (ref 39.0–52.0)
Hemoglobin: 12.4 g/dL — ABNORMAL LOW (ref 13.0–17.0)
MCH: 30.8 pg (ref 26.0–34.0)
MCHC: 34.4 g/dL (ref 30.0–36.0)
MCV: 89.3 fL (ref 80.0–100.0)
Platelets: 195 10*3/uL (ref 150–400)
RBC: 4.03 MIL/uL — ABNORMAL LOW (ref 4.22–5.81)
RDW: 13.6 % (ref 11.5–15.5)
WBC: 9.2 10*3/uL (ref 4.0–10.5)
nRBC: 0 % (ref 0.0–0.2)

## 2020-04-29 LAB — RESPIRATORY PANEL BY RT PCR (FLU A&B, COVID)
Influenza A by PCR: NEGATIVE
Influenza B by PCR: NEGATIVE
SARS Coronavirus 2 by RT PCR: NEGATIVE

## 2020-04-29 LAB — MAGNESIUM: Magnesium: 1.7 mg/dL (ref 1.7–2.4)

## 2020-04-29 MED ORDER — POTASSIUM CHLORIDE CRYS ER 20 MEQ PO TBCR
40.0000 meq | EXTENDED_RELEASE_TABLET | Freq: Once | ORAL | Status: AC
  Administered 2020-04-29: 40 meq via ORAL
  Filled 2020-04-29: qty 2

## 2020-04-29 MED ORDER — AEROCHAMBER PLUS FLO-VU MISC
1.0000 | Freq: Once | Status: AC
  Administered 2020-04-29: 1
  Filled 2020-04-29: qty 1

## 2020-04-29 MED ORDER — PREDNISONE 20 MG PO TABS: 60.0000 mg | ORAL_TABLET | Freq: Every day | ORAL | 0 refills | Status: AC

## 2020-04-29 MED ORDER — IPRATROPIUM-ALBUTEROL 0.5-2.5 (3) MG/3ML IN SOLN: 3.0000 mL | RESPIRATORY_TRACT | 0 refills | Status: DC | PRN

## 2020-04-29 MED ORDER — METHYLPREDNISOLONE SODIUM SUCC 125 MG IJ SOLR
125.0000 mg | Freq: Once | INTRAMUSCULAR | Status: AC
  Administered 2020-04-29: 125 mg via INTRAVENOUS
  Filled 2020-04-29: qty 2

## 2020-04-29 MED ORDER — ALBUTEROL SULFATE HFA 108 (90 BASE) MCG/ACT IN AERS
6.0000 | INHALATION_SPRAY | Freq: Once | RESPIRATORY_TRACT | Status: AC
  Administered 2020-04-29: 6 via RESPIRATORY_TRACT
  Filled 2020-04-29: qty 6.7

## 2020-04-29 MED ORDER — IPRATROPIUM BROMIDE HFA 17 MCG/ACT IN AERS
2.0000 | INHALATION_SPRAY | Freq: Once | RESPIRATORY_TRACT | Status: AC
  Administered 2020-04-29: 2 via RESPIRATORY_TRACT
  Filled 2020-04-29: qty 12.9

## 2020-04-29 NOTE — ED Triage Notes (Signed)
Pt reports cough x1 week. Pt reports called EMS on Monday and reported was given breathing treatment and reports improvement in symptoms but reports continued cough with intermittent production. Pt denies any fevers, reports shortness of breath and chest pain with movement/coughing.

## 2020-04-29 NOTE — Discharge Instructions (Signed)
Take steroids as directed for the next 5 days.  Use breathing treatments every 4 hours as needed.  Follow-up closely with your primary care doctor and pulmonologist.  Your potassium was low today, please increase potassium intake in your diet with things like bananas, pickles or mustard.  Please have your potassium rechecked in 1 week.  If you develop worsening respiratory symptoms, fever, cough or any other new or concerning symptoms

## 2020-04-29 NOTE — ED Provider Notes (Signed)
[facility] EMERGENCY DEPARTMENT Provider Note   CSN: [id number] Arrival date & time: 04/29/20  9678     History Chief Complaint  Patient presents with  . Cough    [name] is a 84 y.o. male.  [name] is a 84 y.o. male with a history of asthma, COPD, hypertension, hyperlipidemia, diabetes, stroke, dementia, who presents to the emergency department for evaluation of cough and shortness of breath. Patient and his wife reports symptoms have been present for about a week. He has been having a frequent cough and feels like he has stuff in his chest that he needs to cough up but cannot get up much sputum. He states that on Monday he and his wife called EMS and he was given a breathing treatment with improvement, but reports since then symptoms have been worsening. Wife reports he was up coughing for most of the night and able to get very little sleep. Shortness of breath is worse with activity, as well as laying flat and during coughing spells. Patient denies chest pain, reports only some soreness with cough. Denies any lower extremity swelling. No lightheadedness or syncope. Patient denies any fevers, chills or known sick contacts. Patient has had Covid vaccines. Has been using inhalers at home overnight without improvement. No other aggravating or alleviating factors. Pt has not smoked since he was 13, no history of home oxygen use.        Past Medical History:  Diagnosis Date  . Anxiety   . Asthma   . Choledocholithiasis 02/24/2013  . COPD (chronic obstructive pulmonary disease) ([place])   . CVA (cerebral vascular accident) ([place]) 2013   loss of memory  . Dementia ([place])   . Diabetes mellitus   . Hx-TIA (transient ischemic attack)   . Hyperlipidemia   . Hypertension     Patient Active Problem List   Diagnosis Date Noted  . Hyperlipidemia associated with type 2 diabetes mellitus ([place]) 11/27/2018  . Intrinsic asthma 03/31/2015  . Dementia without behavioral disturbance  ([place])   . Atelectasis of right lung 01/29/2014  . Pleural effusion on right 11/24/2013  . Late effect of cerebrovascular accident (CVA)   . DM (diabetes mellitus) ([place]) 02/21/2013  . Diabetes mellitus ([place])   . Hypertension associated with type 2 diabetes mellitus ([place])   . Hx-TIA (transient ischemic attack)     Past Surgical History:  Procedure Laterality Date  . CATARACT EXTRACTION W/PHACO Left 04/30/2016   Procedure: CATARACT EXTRACTION PHACO AND INTRAOCULAR LENS PLACEMENT LEFT EYE CDE=9.83;  Surgeon: Tonny Branch, MD;  Location: [facility];  Service: Ophthalmology;  Laterality: Left;  left  . CHOLECYSTECTOMY N/A 02/23/2013   Procedure: LAPAROSCOPIC CHOLECYSTECTOMY;  Surgeon: Jamesetta So, MD;  Location: [facility] ORS;  Service: General;  Laterality: N/A;  . ERCP N/A 02/24/2013   Procedure: ENDOSCOPIC RETROGRADE CHOLANGIOPANCREATOGRAPHY;  Surgeon: Daneil Dolin, MD;  Location: [facility];  Service: Endoscopy;  Laterality: N/A;  . ROTATOR CUFF REPAIR Right        Family History  Problem Relation Age of Onset  . Hypertension Mother   . Hypertension Father   . Diabetes Sister   . Diabetes Sister   . Heart disease Brother   . Emphysema Brother   . Colon cancer Neg Hx     Social History   Tobacco Use  . Smoking status: Never Smoker  . Smokeless tobacco: Never Used  Vaping Use  . Vaping Use: Never used  Substance Use Topics  . Alcohol  use: No  . Drug use: No    Home Medications Prior to Admission medications   Medication Sig Start Date End Date Taking? Authorizing Provider  ACCU-CHEK SOFTCLIX LANCETS lancets Check glucose twice a day 04/10/18   Eustaquio Maize, MD  albuterol (VENTOLIN HFA) 108 (90 Base) MCG/ACT inhaler Inhale 2 puffs into the lungs every 4 (four) hours as needed for wheezing or shortness of breath. 04/12/20   Claretta Fraise, MD  aspirin EC 81 MG tablet Take 162 mg by mouth daily.     [provider]  atorvastatin (LIPITOR) 40 MG tablet Take 1 tablet (40 mg  total) by mouth daily. 01/05/20   Claretta Fraise, MD  Blood Glucose Calibration (ACCU-CHEK AVIVA) SOLN Use with glucometer 04/10/18   Eustaquio Maize, MD  diclofenac Sodium (VOLTAREN) 1 % GEL Apply 2 g topically 4 (four) times daily. Patient not taking: Reported on 01/05/2020 05/22/19   Baruch Gouty, [name]  donepezil ([name]) 10 MG tablet Take 1 tablet (10 mg total) by mouth at bedtime. 04/12/20 05/12/20  Claretta Fraise, MD  fluticasone furoate-vilanterol (BREO ELLIPTA) 100-25 MCG/INH AEPB Inhale 1 puff into the lungs daily. 04/12/20   Claretta Fraise, MD  glucose blood (ACCU-CHEK AVIVA PLUS) test strip Check glucose twice a day 04/10/18   Eustaquio Maize, MD  Insulin Pen Needle (PEN NEEDLES) 32G X 4 MM MISC 1 each by Does not apply route 2 (two) times daily. 04/09/18   Eustaquio Maize, MD  ipratropium-albuterol (DUONEB) 0.5-2.5 (3) MG/3ML SOLN Take 3 mLs by nebulization every 4 (four) hours as needed. 04/29/20   Jacqlyn Larsen, PA-C  losartan (COZAAR) 50 MG tablet Take 1 tablet (50 mg total) by mouth daily. 04/12/20   Claretta Fraise, MD  memantine (NAMENDA) 10 MG tablet Take 1 tablet (10 mg total) by mouth 2 (two) times daily. 09/30/19   Baruch Gouty, [name]  predniSONE (DELTASONE) 20 MG tablet Take 3 tablets (60 mg total) by mouth daily for 5 days. 04/29/20 05/04/20  Jacqlyn Larsen, PA-C  sildenafil (REVATIO) 20 MG tablet Take 2-5 pills at once, orally, with each sexual encounter 04/12/20   Claretta Fraise, MD  TRESIBA FLEXTOUCH 100 UNIT/ML FlexTouch Pen Inject 0.2 mLs (20 Units total) into the skin daily at 10 pm. 01/05/20   Claretta Fraise, MD    Allergies    Ace inhibitors  Review of Systems   Review of Systems  Constitutional: Negative for chills and fever.  HENT: Negative.   Respiratory: Positive for cough and shortness of breath.   Cardiovascular: Negative for chest pain, palpitations and leg swelling.  Gastrointestinal: Negative for abdominal pain, nausea and vomiting.  Genitourinary: Negative  for dysuria and frequency.  Musculoskeletal: Negative for arthralgias and myalgias.  Neurological: Negative for syncope and light-headedness.  All other systems reviewed and are negative.   Physical Exam Updated Vital Signs BP (!) 152/75 (BP Location: Right Arm)   Pulse 88   Temp 98.7 F (37.1 C) (Oral)   Resp 20   Ht 5\' 10"  (1.778 m)   Wt 88 kg   SpO2 100%   BMI 27.84 kg/m   Physical Exam Vitals and nursing note reviewed.  Constitutional:      General: He is not in acute distress.    Appearance: Normal appearance. He is well-developed. He is not diaphoretic.     Comments: Elderly gentleman, well-appearing and in no distress  HENT:     Head: Normocephalic and atraumatic.  Mouth/Throat:     Mouth: Mucous membranes are moist.     Pharynx: Oropharynx is clear.  Eyes:     General:        Right eye: No discharge.        Left eye: No discharge.  Cardiovascular:     Rate and Rhythm: Normal rate and regular rhythm.     Pulses: Normal pulses.     Heart sounds: Normal heart sounds. No murmur heard.  No friction rub. No gallop.   Pulmonary:     Effort: No respiratory distress.     Breath sounds: Wheezing present. No rhonchi or rales.     Comments: Respirations are equal and unlabored and patient is able to speak in short sentences, intermittent cough during exam, O2 sats sitting in the 90s but intermittently patient would dip into the upper 80s but quickly bounced back to the 90s during examination. On auscultation patient with diminished breath sounds bilaterally with some faint wheezing noted, no rales or rhonchi. Chest:     Chest wall: No tenderness.  Abdominal:     General: Bowel sounds are normal. There is no distension.     Palpations: Abdomen is soft. There is no mass.     Tenderness: There is no abdominal tenderness. There is no guarding.     Comments: Abdomen soft, nondistended, nontender to palpation in all quadrants without guarding or peritoneal signs    Musculoskeletal:        General: No deformity.     Cervical back: Neck supple.     Right lower leg: No edema.     Left lower leg: No edema.     Comments: Bilateral lower extremities warm and well perfused without edema  Skin:    General: Skin is warm and dry.     Capillary Refill: Capillary refill takes less than 2 seconds.  Neurological:     Mental Status: He is alert.     Coordination: Coordination normal.     Comments: Speech is clear, able to follow commands Moves extremities without ataxia, coordination intact  Psychiatric:        Mood and Affect: Mood normal.        Behavior: Behavior normal.     ED Results / Procedures / Treatments   Labs (all labs ordered are listed, but only abnormal results are displayed) Labs Reviewed  BASIC METABOLIC PANEL - Abnormal; Notable for the following components:      Result Value   Potassium 3.0 (*)    Glucose, Bld 167 (*)    Creatinine, Ser 1.31 (*)    Calcium 8.5 (*)    GFR, Estimated 54 (*)    All other components within normal limits  CBC - Abnormal; Notable for the following components:   RBC 4.03 (*)    Hemoglobin 12.4 (*)    HCT 36.0 (*)    All other components within normal limits  RESPIRATORY PANEL BY RT PCR (FLU A&B, COVID)  MAGNESIUM    EKG EKG Interpretation  Date/Time:  Friday April 29 2020 10:26:20 EDT Ventricular Rate:  94 PR Interval:    QRS Duration: 113 QT Interval:  400 QTC Calculation: 501 R Axis:   37 Text Interpretation: Sinus rhythm Borderline intraventricular conduction delay Abnormal R-wave progression, early transition Borderline T wave abnormalities Prolonged QT interval similar to prior 10/18 other than QTc prolonged Confirmed by Aletta Edouard [phone]) on 04/29/2020 10:28:47 AM   Radiology DG Chest Portable 1 View  Result Date: 04/29/2020 CLINICAL  DATA:  Cough EXAM: PORTABLE CHEST 1 VIEW COMPARISON:  04/22/2017, CT 01/23/2018 FINDINGS: Opacity at the right lung base is not well  appreciated on this examination but likely corresponds to the area of rounded atelectasis seen on prior CT examination. Chronic right pleural thickening and right-sided volume loss again noted. The lungs are otherwise clear. No pneumothorax. No pleural effusion on the left. Cardiac size is within normal limits. Pulmonary vascularity is normal. IMPRESSION: No active disease. Stable right-sided volume loss with chronic right pleural thickening. Electronically Signed   By: Fidela Salisbury MD   On: 04/29/2020 10:43    Procedures Procedures (including critical care time)  Medications Ordered in ED Medications  albuterol (VENTOLIN HFA) 108 (90 Base) MCG/ACT inhaler 6 puff (6 puffs Inhalation Given 04/29/20 1135)  ipratropium (ATROVENT HFA) inhaler 2 puff (2 puffs Inhalation Given 04/29/20 1136)  aerochamber plus with mask device 1 each (1 each Other Given 04/29/20 1139)  methylPREDNISolone sodium succinate (SOLU-MEDROL) 125 mg/2 mL injection 125 mg (125 mg Intravenous Given 04/29/20 1137)  potassium chloride SA (KLOR-CON) CR tablet 40 mEq (40 mEq Oral Given 04/29/20 1136)    ED Course  I have reviewed the triage vital signs and the nursing notes.  Pertinent labs & imaging results that were available during my care of the patient were reviewed by me and considered in my medical decision making (see chart for details).  Clinical Course as of Apr 29 1410  Fri Apr 29, 3968  [id number] 84 year old male here with cough and shortness of breath for over a week.  No known fevers.  Said it kept him up all night last night.  No chest pain.  Getting steroids and inhaler treatment here.  Check Covid labs chest x-ray EKG.   [MB]    Clinical Course User Index [MB] Hayden Rasmussen, MD   MDM Rules/Calculators/A&P                          84 year old male presents with shortness of breath and cough present and worsening over the past week.  Symptoms improved on Monday after he was given a breathing treatment.   History of asthma and COPD, has not smoked in decades.  On arrival he is overall well-appearing and in no acute respiratory distress, afebrile.  On exam he has some decreased air movement with wheezing noted bilaterally.  Intermittent nonproductive cough.  Will get Covid test, basic labs, chest x-ray and EKG.  Clinically patient does not appear fluid overloaded, and he does not have known history of CHF.  Doubt this is the cause of his symptoms.  No associated chest pain.  Primary concern is for COPD or asthma exacerbation.  Patient given steroids, albuterol and ipratropium here in the ED.  EKG shows sinus rhythm, there is some mild QT prolongation, will check electrolytes today.  Chest x-ray reviewed with no active cardiopulmonary disease, some right-sided volume loss with chronic right pleural thickening which is unchanged.  Lab work shows potassium of 3.0 but no other significant electrolyte derangements, renal function actually improved from baseline, normal magnesium.  No leukocytosis and stable hemoglobin.  On reevaluation after breathing treatments patient is feeling much improved, he is ambulated in the department with no hypoxia or significant increase in respiratory effort.  Covid and influenza swabs negative.  Patient evaluation has been reassuring and symptoms are significantly improved.  Will treat for COPD or asthma exacerbation with burst of steroids and continued breathing treatments at  home.  Will have patient increase p.o. intake in his diet and follow-up closely with his PCP for recheck.  Return precautions discussed.  Discharged home in good condition.   Final Clinical Impression(s) / ED Diagnoses Final diagnoses:  Exacerbation of asthma, unspecified asthma severity, unspecified whether persistent  Hypokalemia    Rx / DC Orders ED Discharge Orders         Ordered    ipratropium-albuterol (DUONEB) 0.5-2.5 (3) MG/3ML SOLN  Every 4 hours PRN        04/29/20 1408     predniSONE (DELTASONE) 20 MG tablet  Daily        04/29/20 1408           Jacqlyn Larsen, [place] 04/29/20 1411    Hayden Rasmussen, MD 04/29/20 [phone]

## 2020-05-18 ENCOUNTER — Telehealth: Payer: Medicare HMO

## 2020-05-19 ENCOUNTER — Telehealth: Payer: Self-pay | Admitting: *Deleted

## 2020-05-19 NOTE — Chronic Care Management (AMB) (Signed)
  Care Management   Note  05/19/2020 Name: [name] MRN: [id number] DOB: Jun 02, 1936  [name] is a 84 y.o. year old male who is a primary care patient of [name], [name], MD and is actively engaged with the care management team. I reached out to [name] by phone today to assist with re-scheduling an initial visit with the RN Case Manager.  Follow up plan: Telephone appointment with care management team member scheduled for:05/20/2020  [place] Management

## 2020-05-20 ENCOUNTER — Ambulatory Visit: Payer: Medicare HMO | Admitting: *Deleted

## 2020-05-20 DIAGNOSIS — F0150 Vascular dementia without behavioral disturbance: Secondary | ICD-10-CM

## 2020-05-20 DIAGNOSIS — Z794 Long term (current) use of insulin: Secondary | ICD-10-CM

## 2020-05-20 DIAGNOSIS — J449 Chronic obstructive pulmonary disease, unspecified: Secondary | ICD-10-CM | POA: Insufficient documentation

## 2020-05-20 DIAGNOSIS — J45909 Unspecified asthma, uncomplicated: Secondary | ICD-10-CM | POA: Insufficient documentation

## 2020-05-20 NOTE — Chronic Care Management (AMB) (Signed)
Chronic Care Management   Follow Up Note   05/20/2020 Name: [name] MRN: [id number] DOB: Dec 15, 1935  Referred by: Claretta Fraise, MD Reason for referral : Chronic Care Management (RN follow-up)   [name] is a 84 y.o. year old male who is a primary care patient of [name], Kenneth Gash, MD. The CCM team was consulted for assistance with chronic disease management and care coordination needs.    Review of patient status, including review of consultants reports, relevant laboratory and other test results, and collaboration with appropriate care team members and the patient's provider was performed as part of comprehensive patient evaluation and provision of chronic care management services.    SDOH (Social Determinants of Health) assessments performed: No See Care Plan activities for detailed interventions related to [facility])     Outpatient Encounter Medications as of 05/20/2020  Medication Sig  . ACCU-CHEK SOFTCLIX LANCETS lancets Check glucose twice a day  . albuterol (VENTOLIN HFA) 108 (90 Base) MCG/ACT inhaler Inhale 2 puffs into the lungs every 4 (four) hours as needed for wheezing or shortness of breath.  [name] Kitchen aspirin EC 81 MG tablet Take 162 mg by mouth daily.   [name] Kitchen atorvastatin (LIPITOR) 40 MG tablet Take 1 tablet (40 mg total) by mouth daily.  . Blood Glucose Calibration (ACCU-CHEK AVIVA) SOLN Use with glucometer  . diclofenac Sodium (VOLTAREN) 1 % GEL Apply 2 g topically 4 (four) times daily. (Patient not taking: Reported on 01/05/2020)  . donepezil ([name]) 10 MG tablet Take 1 tablet (10 mg total) by mouth at bedtime.  . fluticasone furoate-vilanterol (BREO ELLIPTA) 100-25 MCG/INH AEPB Inhale 1 puff into the lungs daily.  [name] Kitchen glucose blood (ACCU-CHEK AVIVA PLUS) test strip Check glucose twice a day  . Insulin Pen Needle (PEN NEEDLES) 32G X 4 MM MISC 1 each by Does not apply route 2 (two) times daily.  [name] Kitchen ipratropium-albuterol (DUONEB) 0.5-2.5 (3) MG/3ML SOLN Take 3 mLs by  nebulization every 4 (four) hours as needed.  [name] Kitchen losartan (COZAAR) 50 MG tablet Take 1 tablet (50 mg total) by mouth daily.  . memantine (NAMENDA) 10 MG tablet Take 1 tablet (10 mg total) by mouth 2 (two) times daily.  . sildenafil (REVATIO) 20 MG tablet Take 2-5 pills at once, orally, with each sexual encounter  . TRESIBA FLEXTOUCH 100 UNIT/ML FlexTouch Pen Inject 0.2 mLs (20 Units total) into the skin daily at 10 pm.   No facility-administered encounter medications on file as of 05/20/2020.     Lab Results  Component Value Date   HGBA1C 7.0 (H) 04/12/2020   HGBA1C 9.6 (H) 01/05/2020   HGBA1C 7.6 (H) 11/27/2018   Lab Results  Component Value Date   MICROALBUR 7.0 (H) 02/03/2015   LDLCALC 49 04/12/2020   CREATININE 1.31 (H) 04/29/2020     Goals Addressed              This Visit's Progress     Patient Stated   .  "We want to keep his blood sugar under control" (pt-stated)        Current Barriers:  . Cognitive Deficits  . Non adherence to recommendations  Nurse Case Manager Clinical Goal(s):  [name] Kitchen Over the next 90 days, patient will attend all scheduled medical appointments . Over the next 90 days, the patient will demonstrate ongoing self health care management ability as evidenced by maintaining an A1C of 7  Interventions:  . Evaluation of current treatment plan related to diabetes management and patient's adherence to  plan as established by provider. . Chart reviewed including recent office notes and lab results . Dicsussed recent lab results o A1C improved to 7 . Provided encouragement for keeping blood sugar under control as evidenced by an A1C of 7 . Reviewed medications . Discussed home blood sugar testing o Typically testing once a day . Discussed mobility and ability to perform ADLs . Discussed plans with patient for ongoing care management follow up and provided patient with direct contact information for care management team . Advised patient, providing  education and rationale, to check cbg daily and record, calling [phone] for findings outside established parameters.   . Reviewed upcoming appointments . Previously provided with RNCM contact number and encouraged to reach out as needed   Patient Self Care Activities:  . Performs ADL's independently  . Has assistance from wife as needed  Please see past updates related to this goal by clicking on the "Past Updates" button in the selected goal       .  COMPLETED: Client wants to talk with someone about anxiety or depression management (pt-stated)        Current Barriers:  [name] Kitchen Medical issues management in client with Chronic diagnoses of HLD, DM, HTN, Hx TIA, DM, Dementia . Anxiety issues  Clinical Social Work Clinical Goal(s):  Over the next 30 days, client/spouse will work with LCSW to address concerns related to management of anxiety symptoms of client. Interventions: . Talked with client/spouse about client use of relaxation techniques (taking walk,working in yard,being outdoors) . Talked with client/spouse about family support. . Encouraged client/spouse to talk with RN CM [name] about nursing needs of client . Talked with client/spouse about client completion of daily ADLs for client  Patient Self Care Activities:  . Attends all scheduled provider appointments . Performs ADL's independently . Calls provider office for new concerns or questions   Plan: Client/spouse to talk with RN CM to discuss nursing needs of client LCSW to call client/spouse in next 3 weeks to discuss client management of anxiety symptoms Client to attend scheduled client medical appointments Client to use relaxation techniques to help manage anxiety symptoms.  Initial goal documentation      Other   .  Respiratory Disease Management              CARE PLAN ENTRY (see longitudinal plan of care for additional care plan information)  Current Barriers:  . Chronic Disease Management  support and education needs related to COPD and asthma . Cognitive Deficits  Nurse Case Manager Clinical Goal(s):  [name] Kitchen Over the next 90 days, patient will demonstrate a decrease in COPD/asthma exacerbations as evidenced by following COPD action plan and having no ED visits for exacerbations . Over the next 90 days, patient will attend all scheduled medical appointments: Dr Lamonte Sakai 06/01/20   Interventions:  . Inter-disciplinary care team collaboration (see longitudinal plan of care) . Chart reviewed including recent office notes, ED notes, and lab results . Reviewed and discussed medications: Duoneb, albuterol, and Breo o Patient is compliant with using Breo daily o Has used Duoneb approximately twice this week and albuterol once . Discussed recent exacerbation and management o Symptoms have improved back to baseline . Discussed mobility and ability to perform ADLs . Reviewed upcoming appt with pulmonologist on 06/01/20 . Provided with RN Care Manager telephone number and encouraged to reach out as needed  Patient Self Care Activities:  . Performs most ADLs independently with assistance from wife as needed . Unable to  drive  Initial goal documentation         Plan:   Telephone follow up appointment with care management team member scheduled for: 06/29/20 with RN Care Manager, 06/22/20 with LCSW   [name], BSN, RN-BC [place] / [place] Management Direct Dial: [phone]

## 2020-05-20 NOTE — Patient Instructions (Signed)
Visit Information  Goals Addressed              This Visit's Progress     Patient Stated   .  "We want to keep his blood sugar under control" (pt-stated)        Current Barriers:  . Cognitive Deficits  . Non adherence to recommendations  Nurse Case Manager Clinical Goal(s):  [name] Kitchen Over the next 90 days, patient will attend all scheduled medical appointments . Over the next 90 days, the patient will demonstrate ongoing self health care management ability as evidenced by maintaining an A1C of 7  Interventions:  . Evaluation of current treatment plan related to diabetes management and patient's adherence to plan as established by provider. . Chart reviewed including recent office notes and lab results . Dicsussed recent lab results o A1C improved to 7 . Provided encouragement for keeping blood sugar under control as evidenced by an A1C of 7 . Reviewed medications . Discussed home blood sugar testing o Typically testing once a day . Discussed mobility and ability to perform ADLs . Discussed plans with patient for ongoing care management follow up and provided patient with direct contact information for care management team . Advised patient, providing education and rationale, to check cbg daily and record, calling [phone] for findings outside established parameters.   . Reviewed upcoming appointments . Previously provided with RNCM contact number and encouraged to reach out as needed   Patient Self Care Activities:  . Performs ADL's independently  . Has assistance from wife as needed  Please see past updates related to this goal by clicking on the "Past Updates" button in the selected goal       .  COMPLETED: Client wants to talk with someone about anxiety or depression management (pt-stated)        Current Barriers:  [name] Kitchen Medical issues management in client with Chronic diagnoses of HLD, DM, HTN, Hx TIA, DM, Dementia . Anxiety issues  Clinical Social Work Clinical Goal(s):   Over the next 30 days, client/spouse will work with LCSW to address concerns related to management of anxiety symptoms of client. Interventions: . Talked with client/spouse about client use of relaxation techniques (taking walk,working in yard,being outdoors) . Talked with client/spouse about family support. . Encouraged client/spouse to talk with RN CM [name] about nursing needs of client . Talked with client/spouse about client completion of daily ADLs for client  Patient Self Care Activities:  . Attends all scheduled provider appointments . Performs ADL's independently . Calls provider office for new concerns or questions   Plan: Client/spouse to talk with RN CM to discuss nursing needs of client LCSW to call client/spouse in next 3 weeks to discuss client management of anxiety symptoms Client to attend scheduled client medical appointments Client to use relaxation techniques to help manage anxiety symptoms.  Initial goal documentation      Other   .  Respiratory Disease Management              CARE PLAN ENTRY (see longitudinal plan of care for additional care plan information)  Current Barriers:  . Chronic Disease Management support and education needs related to COPD and asthma . Cognitive Deficits  Nurse Case Manager Clinical Goal(s):  [name] Kitchen Over the next 90 days, patient will demonstrate a decrease in COPD/asthma exacerbations as evidenced by following COPD action plan and having no ED visits for exacerbations . Over the next 90 days, patient will attend all scheduled medical appointments: Dr  Byrum 06/01/20   Interventions:  . Inter-disciplinary care team collaboration (see longitudinal plan of care) . Chart reviewed including recent office notes, ED notes, and lab results . Reviewed and discussed medications: Duoneb, albuterol, and Breo o Patient is compliant with using Breo daily o Has used Duoneb approximately twice this week and albuterol once . Discussed  recent exacerbation and management o Symptoms have improved back to baseline . Discussed mobility and ability to perform ADLs . Reviewed upcoming appt with pulmonologist on 06/01/20 . Provided with RN Care Manager telephone number and encouraged to reach out as needed  Patient Self Care Activities:  . Performs most ADLs independently with assistance from wife as needed . Unable to drive  Initial goal documentation        The patient verbalized understanding of instructions, educational materials, and care plan provided today and declined offer to receive copy of patient instructions, educational materials, and care plan.   Follow-up Plan Telephone follow up appointment with care management team member scheduled for: 06/29/20 with RN Care Manager, 06/22/20 with LCSW   [name], BSN, RN-BC [place] [facility] [place] [facility] Direct Dial: [phone]

## 2020-06-01 ENCOUNTER — Ambulatory Visit: Payer: Medicare HMO | Admitting: Emergency Medicine

## 2020-06-17 ENCOUNTER — Encounter: Payer: Self-pay | Admitting: *Deleted

## 2020-06-22 ENCOUNTER — Ambulatory Visit: Payer: Medicare HMO | Admitting: Licensed Clinical Social Worker

## 2020-06-22 ENCOUNTER — Other Ambulatory Visit: Payer: Self-pay

## 2020-06-22 ENCOUNTER — Ambulatory Visit: Payer: Medicare HMO | Admitting: Primary Care

## 2020-06-22 ENCOUNTER — Ambulatory Visit: Payer: Medicare HMO | Admitting: Pulmonary Disease

## 2020-06-22 ENCOUNTER — Encounter: Payer: Self-pay | Admitting: Primary Care

## 2020-06-22 DIAGNOSIS — J9811 Atelectasis: Secondary | ICD-10-CM | POA: Diagnosis not present

## 2020-06-22 DIAGNOSIS — J45909 Unspecified asthma, uncomplicated: Secondary | ICD-10-CM

## 2020-06-22 DIAGNOSIS — E785 Hyperlipidemia, unspecified: Secondary | ICD-10-CM

## 2020-06-22 DIAGNOSIS — E1169 Type 2 diabetes mellitus with other specified complication: Secondary | ICD-10-CM

## 2020-06-22 DIAGNOSIS — Z8673 Personal history of transient ischemic attack (TIA), and cerebral infarction without residual deficits: Secondary | ICD-10-CM

## 2020-06-22 DIAGNOSIS — J4521 Mild intermittent asthma with (acute) exacerbation: Secondary | ICD-10-CM | POA: Diagnosis not present

## 2020-06-22 DIAGNOSIS — I152 Hypertension secondary to endocrine disorders: Secondary | ICD-10-CM

## 2020-06-22 DIAGNOSIS — F0150 Vascular dementia without behavioral disturbance: Secondary | ICD-10-CM

## 2020-06-22 MED ORDER — BREO ELLIPTA 100-25 MCG/INH IN AEPB: 1.0000 | INHALATION_SPRAY | Freq: Every day | RESPIRATORY_TRACT | 0 refills | Status: DC

## 2020-06-22 NOTE — Assessment & Plan Note (Addendum)
-   Fixed asthma. No clinical symptoms or recent exacerbations. Uses Breo intermittently, has not required SABA  - Recommend patient continue Breo 100 one puff daily (2 samples given and RX was refilled in October 2021); albuterol 2 puffs every 6 hours prn sob/wheezing  - May want to consider de-esclating therapy to just ICS inhaler or trial off all together in the future  - Up to date with influenza vaccine, due for covid-19 booster  - FU in 1 year with Dr. Lamonte Sakai or sooner if needed

## 2020-06-22 NOTE — Patient Instructions (Addendum)
Licensed Clinical Social Worker Visit Information  Goals we discussed today:   Client wants to talk with someone about anxiety or depression management (pt-stated)         Current Barriers:  Medical issues management in client with Chronic diagnoses of HLD, DM, HTN, Hx TIA, DM, Dementia  Anxiety issues  Clinical Social Work Clinical Goal(s): Over the next 30 days, client/spouse will work with LCSW to address concerns related to management of anxiety symptoms of client. Interventions:  Encouraged client/spouse to talk with RN CM [name] about nursing needs of client  Talked with client/spouse about client completion of daily ADLs for client  Talked with [name] about appetite of client  Talked with [name] about weight of client   Talked with [name] about sleeping issues of client  Talked with [name] about client's upcoming medical appointments  Talked with [name] about mood of client [name] said client may become angry occasionally in the afternoon)  Talked with [name] about vision of client  Patient Self Care Activities:   Attends all scheduled provider appointments  Performs ADL's independently  Calls provider office for new concerns or questions   Plan: Client/spouse to talk with RN CM to discuss nursing needs of client LCSW to call client/spouse in next4weeks to discuss client management of anxiety symptoms Client to attend scheduled client medical appointments Client to use relaxation techniques to help manage anxiety symptoms.  Initial goal documentation   Follow Up Plan:LCSW to call client or spouse of client in next 4 weeks to talk with client or spouse about client management of anxiety symptoms  Materials Provided: No  The patient/[name], spouse of client, verbalized understanding of instructions provided today and declined a print copy of patient instruction materials.   [name].Sheri Prows MSW, LCSW Licensed Clinical Social  Worker [facility] [phone]

## 2020-06-22 NOTE — Patient Instructions (Addendum)
Recommendations: - Continue Breo 100 - take 1 puff daily  (rinse mouth after use) - Use Albuterol 2 puffs every 6 hours for breakthrough shortness of breath/wheezing  - Please look into getting Covid-19 booster, recommended 6 months after last vaccine  - If you develop increased shortness of breath, wheezing or cough please call office for visit   Follow-up: - 1 year with Dr. Burnis Kingfisher    Asthma, Adult  Asthma is a long-term (chronic) condition in which the airways get tight and narrow. The airways are the breathing passages that lead from the nose and mouth down into the lungs. A person with asthma will have times when symptoms get worse. These are called asthma attacks. They can cause coughing, whistling sounds when you breathe (wheezing), shortness of breath, and chest pain. They can make it hard to breathe. There is no cure for asthma, but medicines and lifestyle changes can help control it. There are many things that can bring on an asthma attack or make asthma symptoms worse (triggers). Common triggers include:  Mold.  Dust.  Cigarette smoke.  Cockroaches.  Things that can cause allergy symptoms (allergens). These include animal skin flakes (dander) and pollen from trees or grass.  Things that pollute the air. These may include household cleaners, wood smoke, smog, or chemical odors.  Cold air, weather changes, and wind.  Crying or laughing hard.  Stress.  Certain medicines or drugs.  Certain foods such as dried fruit, potato chips, and grape juice.  Infections, such as a cold or the flu.  Certain medical conditions or diseases.  Exercise or tiring activities. Asthma may be treated with medicines and by staying away from the things that cause asthma attacks. Types of medicines may include:  Controller medicines. These help prevent asthma symptoms. They are usually taken every day.  Fast-acting reliever or rescue medicines. These quickly relieve asthma symptoms. They  are used as needed and provide short-term relief.  Allergy medicines if your attacks are brought on by allergens.  Medicines to help control the body's defense (immune) system. Follow these instructions at home: Avoiding triggers in your home  Change your heating and air conditioning filter often.  Limit your use of fireplaces and wood stoves.  Get rid of pests (such as roaches and mice) and their droppings.  Throw away plants if you see mold on them.  Clean your floors. Dust regularly. Use cleaning products that do not smell.  Have someone vacuum when you are not home. Use a vacuum cleaner with a HEPA filter if possible.  Replace carpet with wood, tile, or vinyl flooring. Carpet can trap animal skin flakes and dust.  Use allergy-proof pillows, mattress covers, and box spring covers.  Wash bed sheets and blankets every week in hot water. Dry them in a dryer.  Keep your bedroom free of any triggers.  Avoid pets and keep windows closed when things that cause allergy symptoms are in the air.  Use blankets that are made of polyester or cotton.  Clean bathrooms and kitchens with bleach. If possible, have someone repaint the walls in these rooms with mold-resistant paint. Keep out of the rooms that are being cleaned and painted.  Wash your hands often with soap and water. If soap and water are not available, use hand sanitizer.  Do not allow anyone to smoke in your home. General instructions  Take over-the-counter and prescription medicines only as told by your doctor. ? Talk with your doctor if you have questions about  how or when to take your medicines. ? Make note if you need to use your medicines more often than usual.  Do not use any products that contain nicotine or tobacco, such as cigarettes and e-cigarettes. If you need help quitting, ask your doctor.  Stay away from secondhand smoke.  Avoid doing things outdoors when allergen counts are high and when air quality is  low.  Wear a ski mask when doing outdoor activities in the winter. The mask should cover your nose and mouth. Exercise indoors on cold days if you can.  Warm up before you exercise. Take time to cool down after exercise.  Use a peak flow meter as told by your doctor. A peak flow meter is a tool that measures how well the lungs are working.  Keep track of the peak flow meter's readings. Write them down.  Follow your asthma action plan. This is a written plan for taking care of your asthma and treating your attacks.  Make sure you get all the shots (vaccines) that your doctor recommends. Ask your doctor about a flu shot and a pneumonia shot.  Keep all follow-up visits as told by your doctor. This is important. Contact a doctor if:  You have wheezing, shortness of breath, or a cough even while taking medicine to prevent attacks.  The mucus you cough up (sputum) is thicker than usual.  The mucus you cough up changes from clear or white to yellow, green, gray, or bloody.  You have problems from the medicine you are taking, such as: ? A rash. ? Itching. ? Swelling. ? Trouble breathing.  You need reliever medicines more than 2-3 times a week.  Your peak flow reading is still at 50-79% of your personal best after following the action plan for 1 hour.  You have a fever. Get help right away if:  You seem to be worse and are not responding to medicine during an asthma attack.  You are short of breath even at rest.  You get short of breath when doing very little activity.  You have trouble eating, drinking, or talking.  You have chest pain or tightness.  You have a fast heartbeat.  Your lips or fingernails start to turn blue.  You are light-headed or dizzy, or you faint.  Your peak flow is less than 50% of your personal best.  You feel too tired to breathe normally. Summary  Asthma is a long-term (chronic) condition in which the airways get tight and narrow. An asthma  attack can make it hard to breathe.  Asthma cannot be cured, but medicines and lifestyle changes can help control it.  Make sure you understand how to avoid triggers and how and when to use your medicines. This information is not intended to replace advice given to you by your health care provider. Make sure you discuss any questions you have with your health care provider. Document Revised: 08/28/2018 Document Reviewed: 07/30/2016 Elsevier Patient Education  2020 [employer].

## 2020-06-22 NOTE — Assessment & Plan Note (Addendum)
-   Stable on CT imaging as of 2019, no change in clinical symptoms  - No further follow-up needed

## 2020-06-22 NOTE — Progress Notes (Signed)
@Patient  ID: [name], male    DOB: 1936/01/11, 84 y.o.   MRN: [id number]  Chief Complaint  Patient presents with  . Follow-up    Referring provider: Claretta Fraise, MD  HPI: 84 year old male, never smoked.  Past medical history significant for COPD/fixed asthma, pleural effusion on right, hypertension, type 2 diabetes, hyperlipidemia, dementia, history TIA.  Dr. Lamonte Sakai, last on 07/08/2018.  We follow him for right lower lobe density in the aftermath of pneumonia and pleural effusion.  He had PET scan and CT scan that been reassuring.  He had a repeat scan done on 01/23/2018 that shows stable right lower lobe round atelectasis with associated scar and some loculated pleural fluid.  Patient does not need repeat CT scans since stable for several years. Maintained on Breo 100 and prn albuterol.   06/22/2020- interim hx  Presents today for regular follow-up.  Not been seen since 2019 due to pandemic. Accompanied by his son. He is doing well, no complaints. He hasn't been using Breo daily, uses it intermittently based on his asthma symptoms. He hasn't required his Albuterol rescue inhaler. He walks 1/2 mile every day. Denies shortness of breath, chest tightness, wheezing or cough. He is up to date with flu shot and has had both covid-19 vaccines. He is due for covid booster.    Allergies  Allergen Reactions  . Ace Inhibitors Swelling    Immunization History  Administered Date(s) Administered  . Fluad Quad(high Dose 65+) 04/16/2019, 04/12/2020  . Influenza Split 03/26/2012  . Influenza, High Dose Seasonal PF 03/28/2016, 04/08/2017, 04/09/2018  . Influenza,inj,Quad PF,6+ Mos 04/21/2014  . Influenza-Unspecified 05/10/2015  . Pneumococcal Conjugate-13 08/18/2015  . Pneumococcal Polysaccharide-23 06/08/2014  . Td 07/04/2016    Past Medical History:  Diagnosis Date  . Anxiety   . Asthma   . Choledocholithiasis 02/24/2013  . COPD (chronic obstructive pulmonary disease) ([place])   . CVA  (cerebral vascular accident) ([place]) 2013   loss of memory  . Dementia ([place])   . Diabetes mellitus   . Hx-TIA (transient ischemic attack)   . Hyperlipidemia   . Hypertension     Tobacco History: Social History   Tobacco Use  Smoking Status Never Smoker  Smokeless Tobacco Never Used   Counseling given: Not Answered   Outpatient Medications Prior to Visit  Medication Sig Dispense Refill  . ACCU-CHEK SOFTCLIX LANCETS lancets Check glucose twice a day 200 each 3  . albuterol (VENTOLIN HFA) 108 (90 Base) MCG/ACT inhaler Inhale 2 puffs into the lungs every 4 (four) hours as needed for wheezing or shortness of breath. 18 g 5  . aspirin EC 81 MG tablet Take 162 mg by mouth daily.     [name] Kitchen atorvastatin (LIPITOR) 40 MG tablet Take 1 tablet (40 mg total) by mouth daily. 90 tablet 1  . Blood Glucose Calibration (ACCU-CHEK AVIVA) SOLN Use with glucometer 1 each 0  . diclofenac Sodium (VOLTAREN) 1 % GEL Apply 2 g topically 4 (four) times daily. 350 g 1  . fluticasone furoate-vilanterol (BREO ELLIPTA) 100-25 MCG/INH AEPB Inhale 1 puff into the lungs daily. 30 each 5  . glucose blood (ACCU-CHEK AVIVA PLUS) test strip Check glucose twice a day 200 each 3  . Insulin Pen Needle (PEN NEEDLES) 32G X 4 MM MISC 1 each by Does not apply route 2 (two) times daily. 90 each 3  . ipratropium-albuterol (DUONEB) 0.5-2.5 (3) MG/3ML SOLN Take 3 mLs by nebulization every 4 (four) hours as needed. 360 mL 0  .  losartan (COZAAR) 50 MG tablet Take 1 tablet (50 mg total) by mouth daily. 90 tablet 1  . memantine (NAMENDA) 10 MG tablet Take 1 tablet (10 mg total) by mouth 2 (two) times daily. 180 tablet 3  . sildenafil (REVATIO) 20 MG tablet Take 2-5 pills at once, orally, with each sexual encounter 50 tablet 5  . TRESIBA FLEXTOUCH 100 UNIT/ML FlexTouch Pen Inject 0.2 mLs (20 Units total) into the skin daily at 10 pm. 5 pen 3  . donepezil ([name]) 10 MG tablet Take 1 tablet (10 mg total) by mouth at bedtime. 90 tablet 1    No facility-administered medications prior to visit.    Review of Systems  Review of Systems  Constitutional: Negative.   HENT: Negative.   Respiratory: Negative.   Cardiovascular: Negative.     Physical Exam  BP 114/68 (BP Location: Left Arm, Cuff Size: Normal)   Pulse 75   Temp 97.9 F (36.6 C) (Oral)   Ht 6' (1.829 m)   Wt 190 lb 6.4 oz (86.4 kg)   SpO2 99%   BMI 25.82 kg/m  Physical Exam Constitutional:      Appearance: Normal appearance. He is normal weight.  HENT:     Mouth/Throat:     Mouth: Mucous membranes are moist.     Pharynx: Oropharynx is clear.  Cardiovascular:     Rate and Rhythm: Normal rate and regular rhythm.  Pulmonary:     Effort: Pulmonary effort is normal.     Breath sounds: Normal breath sounds.  Musculoskeletal:     Cervical back: Normal range of motion and neck supple.  Skin:    General: Skin is warm and dry.  Neurological:     General: No focal deficit present.     Mental Status: He is alert. Mental status is at baseline.  Psychiatric:        Mood and Affect: Mood normal.        Behavior: Behavior normal.      Lab Results:  CBC    Component Value Date/Time   WBC 9.2 04/29/2020 1025   RBC 4.03 (L) 04/29/2020 1025   HGB 12.4 (L) 04/29/2020 1025   HGB 13.4 04/12/2020 1132   HCT 36.0 (L) 04/29/2020 1025   HCT 39.5 04/12/2020 1132   PLT 195 04/29/2020 1025   PLT 178 04/12/2020 1132   MCV 89.3 04/29/2020 1025   MCV 90 04/12/2020 1132   MCH 30.8 04/29/2020 1025   MCHC 34.4 04/29/2020 1025   RDW 13.6 04/29/2020 1025   RDW 12.5 04/12/2020 1132   LYMPHSABS 2.4 04/12/2020 1132   MONOABS 0.4 04/27/2016 1532   EOSABS 0.3 04/12/2020 1132   BASOSABS 0.1 04/12/2020 1132    BMET    Component Value Date/Time   NA 140 04/29/2020 1025   NA 142 04/12/2020 1132   K 3.0 (L) 04/29/2020 1025   CL 106 04/29/2020 1025   CO2 23 04/29/2020 1025   GLUCOSE 167 (H) 04/29/2020 1025   BUN 11 04/29/2020 1025   BUN 15 04/12/2020 1132    CREATININE 1.31 (H) 04/29/2020 1025   CREATININE 1.25 (H) 12/27/2015 1022   CALCIUM 8.5 (L) 04/29/2020 1025   GFRNONAA 54 (L) 04/29/2020 1025   GFRNONAA 55 (L) 12/27/2015 1022   GFRAA 44 (L) 04/12/2020 1132   GFRAA 64 12/27/2015 1022    BNP No results found for: BNP  ProBNP    Component Value Date/Time   PROBNP 356.7 11/24/2013 0923    Imaging:  No results found.   Assessment & Plan:   Asthma - Fixed asthma. No clinical symptoms or recent exacerbations. Uses Breo intermittently, has not required SABA  - Recommend patient continue Breo 100 one puff daily (2 samples given and RX was refilled in October 2021); albuterol 2 puffs every 6 hours prn sob/wheezing  - May want to consider de-esclating therapy to just ICS inhaler or trial off all together in the future  - Up to date with influenza vaccine, due for covid-19 booster  - FU in 1 year with Dr. Lamonte Sakai or sooner if needed   Atelectasis of right lung - Stable on CT imaging as of 2019, no change in clinical symptoms  - No further follow-up needed      Martyn Ehrich, NP 06/22/2020

## 2020-06-22 NOTE — Chronic Care Management (AMB) (Signed)
Chronic Care Management    Clinical Social Work Follow Up Note  06/22/2020 Name: [name] MRN: [id number] DOB: 1936-03-12  [name] is a 84 y.o. year old male who is a primary care patient of [name], Kenneth Gash, MD. The CCM team was consulted for assistance with [name] .   Review of patient status, including review of consultants reports, other relevant assessments, and collaboration with appropriate care team members and the patient's provider was performed as part of comprehensive patient evaluation and provision of chronic care management services.    SDOH (Social Determinants of Health) assessments performed: No; risk for depression ; risk for tobacco use; risk for stress; risk for physical inactivity  Flowsheet Row Chronic Care Management from 10/07/2018 in [facility]  PHQ-9 Total Score 7       GAD 7 : Generalized Anxiety Score 10/07/2018  Nervous, Anxious, on Edge 1  Control/stop worrying 0  Worry too much - different things 1  Trouble relaxing 1  Restless 1  Easily annoyed or irritable 0  Afraid - awful might happen 0  Total GAD 7 Score 4  Anxiety Difficulty Somewhat difficult    Outpatient Encounter Medications as of 06/22/2020  Medication Sig  . ACCU-CHEK SOFTCLIX LANCETS lancets Check glucose twice a day  . albuterol (VENTOLIN HFA) 108 (90 Base) MCG/ACT inhaler Inhale 2 puffs into the lungs every 4 (four) hours as needed for wheezing or shortness of breath.  [name] Kitchen aspirin EC 81 MG tablet Take 162 mg by mouth daily.   [name] Kitchen atorvastatin (LIPITOR) 40 MG tablet Take 1 tablet (40 mg total) by mouth daily.  . Blood Glucose Calibration (ACCU-CHEK AVIVA) SOLN Use with glucometer  . diclofenac Sodium (VOLTAREN) 1 % GEL Apply 2 g topically 4 (four) times daily.  [name] Kitchen donepezil ([name]) 10 MG tablet Take 1 tablet (10 mg total) by mouth at bedtime.  . fluticasone furoate-vilanterol (BREO ELLIPTA) 100-25 MCG/INH AEPB Inhale 1 puff into the lungs  daily.  . fluticasone furoate-vilanterol (BREO ELLIPTA) 100-25 MCG/INH AEPB Inhale 1 puff into the lungs daily.  [name] Kitchen glucose blood (ACCU-CHEK AVIVA PLUS) test strip Check glucose twice a day  . Insulin Pen Needle (PEN NEEDLES) 32G X 4 MM MISC 1 each by Does not apply route 2 (two) times daily.  [name] Kitchen ipratropium-albuterol (DUONEB) 0.5-2.5 (3) MG/3ML SOLN Take 3 mLs by nebulization every 4 (four) hours as needed.  [name] Kitchen losartan (COZAAR) 50 MG tablet Take 1 tablet (50 mg total) by mouth daily.  . memantine (NAMENDA) 10 MG tablet Take 1 tablet (10 mg total) by mouth 2 (two) times daily.  . sildenafil (REVATIO) 20 MG tablet Take 2-5 pills at once, orally, with each sexual encounter  . TRESIBA FLEXTOUCH 100 UNIT/ML FlexTouch Pen Inject 0.2 mLs (20 Units total) into the skin daily at 10 pm.   No facility-administered encounter medications on file as of 06/22/2020.    Goals     Client wants to talk with someone about anxiety or depression management (pt-stated)         Current Barriers:   Medical issues management in client with Chronic diagnoses of HLD, DM, HTN, Hx TIA, DM, Dementia  Anxiety issues  Clinical Social Work Clinical Goal(s):  Over the next 30 days, client/spouse will work with LCSW to address concerns related to management of anxiety symptoms of client. Interventions:  Encouraged client/spouse to talk with RN CM [name] about nursing needs of client  Talked with client/spouse about client completion  of daily ADLs for client  Talked with [name] about appetite of client  Talked with [name] about weight of client   Talked with [name] about sleeping issues of client  Talked with [name] about client's upcoming medical appointments  Talked with [name] about mood of client [name] said client may become angry occasionally in the afternoon)  Talked with [name] about vision of client  Patient Self Care Activities:   Attends all scheduled provider appointments  Performs ADL's  independently  Calls provider office for new concerns or questions   Plan: Client/spouse to talk with RN CM to discuss nursing needs of client LCSW to call client/spouse in next 4 weeks to discuss client management of anxiety symptoms Client to attend scheduled client medical appointments Client to use relaxation techniques to help manage anxiety symptoms.  Initial goal documentation    Follow Up Plan: LCSW to call client or spouse of client in next 4 weeks to talk with client or spouse about client management of anxiety symptoms  [name].[name] MSW, LCSW Licensed Clinical Social Worker [facility] [phone]

## 2020-06-29 ENCOUNTER — Telehealth: Payer: Medicare HMO

## 2020-07-12 ENCOUNTER — Other Ambulatory Visit: Payer: Self-pay | Admitting: *Deleted

## 2020-07-12 DIAGNOSIS — E1129 Type 2 diabetes mellitus with other diabetic kidney complication: Secondary | ICD-10-CM

## 2020-07-12 MED ORDER — ATORVASTATIN CALCIUM 40 MG PO TABS: 40.0000 mg | ORAL_TABLET | Freq: Every day | ORAL | 0 refills | Status: DC

## 2020-07-14 ENCOUNTER — Ambulatory Visit: Payer: Medicare HMO | Admitting: Family Medicine

## 2020-07-21 ENCOUNTER — Encounter: Payer: Self-pay | Admitting: Family Medicine

## 2020-07-21 ENCOUNTER — Other Ambulatory Visit: Payer: Self-pay

## 2020-07-21 ENCOUNTER — Ambulatory Visit (INDEPENDENT_AMBULATORY_CARE_PROVIDER_SITE_OTHER): Payer: Medicare HMO | Admitting: Family Medicine

## 2020-07-21 VITALS — BP 136/86 | HR 70 | Temp 97.3°F | Resp 20 | Ht 72.0 in | Wt 190.1 lb

## 2020-07-21 DIAGNOSIS — E1159 Type 2 diabetes mellitus with other circulatory complications: Secondary | ICD-10-CM

## 2020-07-21 DIAGNOSIS — Z794 Long term (current) use of insulin: Secondary | ICD-10-CM | POA: Diagnosis not present

## 2020-07-21 DIAGNOSIS — E1169 Type 2 diabetes mellitus with other specified complication: Secondary | ICD-10-CM

## 2020-07-21 DIAGNOSIS — I152 Hypertension secondary to endocrine disorders: Secondary | ICD-10-CM | POA: Diagnosis not present

## 2020-07-21 DIAGNOSIS — E785 Hyperlipidemia, unspecified: Secondary | ICD-10-CM | POA: Diagnosis not present

## 2020-07-21 DIAGNOSIS — J449 Chronic obstructive pulmonary disease, unspecified: Secondary | ICD-10-CM | POA: Diagnosis not present

## 2020-07-21 LAB — BAYER DCA HB A1C WAIVED: HB A1C (BAYER DCA - WAIVED): 7.2 % — ABNORMAL HIGH (ref <7.0)

## 2020-07-21 NOTE — Progress Notes (Signed)
Subjective:  Patient ID: [name],  male    DOB: 01-17-1936  Age: 85 y.o.    CC: Medical Management of Chronic Issues   HPI LABARON DIGIROLAMO presents for  follow-up of hypertension. Patient has no history of headache chest pain or shortness of breath or recent cough. Patient also denies symptoms of TIA such as numbness weakness lateralizing. Patient denies side effects from medication. States taking it regularly.  Patient also  in for follow-up of elevated cholesterol. Doing well without complaints on current medication. Denies side effects  including myalgia and arthralgia and nausea. Also in today for liver function testing. Currently no chest pain, shortness of breath or other cardiovascular related symptoms noted.  Follow-up of diabetes. Patient does check blood sugar at home. Readings run between 120 and 200 Patient denies symptoms such as excessive hunger or urinary frequency, excessive hunger, nausea No significant hypoglycemic spells noted. Medications reviewed. Pt reports taking them regularly. Pt. denies complication/adverse reaction today.    History Donatello has a past medical history of Anxiety, Asthma, Choledocholithiasis (02/24/2013), COPD (chronic obstructive pulmonary disease) ([place]), CVA (cerebral vascular accident) ([place]) (2013), Dementia ([place]), Diabetes mellitus, TIA (transient ischemic attack), Hyperlipidemia, and Hypertension.   He has a past surgical history that includes Rotator cuff repair (Right); Cholecystectomy (N/A, 02/23/2013); ERCP (N/A, 02/24/2013); and Cataract extraction w/PHACO (Left, 04/30/2016).   His family history includes Diabetes in his sister and sister; Emphysema in his brother; Heart disease in his brother; Hypertension in his father and mother.He reports that he has never smoked. He has never used smokeless tobacco. He reports that he does not drink alcohol and does not use drugs.  Current Outpatient Medications on File Prior to Visit  Medication  Sig Dispense Refill  . ACCU-CHEK SOFTCLIX LANCETS lancets Check glucose twice a day 200 each 3  . albuterol (VENTOLIN HFA) 108 (90 Base) MCG/ACT inhaler Inhale 2 puffs into the lungs every 4 (four) hours as needed for wheezing or shortness of breath. 18 g 5  . aspirin EC 81 MG tablet Take 162 mg by mouth daily.     [name] Kitchen atorvastatin (LIPITOR) 40 MG tablet Take 1 tablet (40 mg total) by mouth daily. 90 tablet 0  . Blood Glucose Calibration (ACCU-CHEK AVIVA) SOLN Use with glucometer 1 each 0  . diclofenac Sodium (VOLTAREN) 1 % GEL Apply 2 g topically 4 (four) times daily. 350 g 1  . donepezil ([name]) 10 MG tablet Take 1 tablet (10 mg total) by mouth at bedtime. 90 tablet 1  . fluticasone furoate-vilanterol (BREO ELLIPTA) 100-25 MCG/INH AEPB Inhale 1 puff into the lungs daily. 30 each 5  . glucose blood (ACCU-CHEK AVIVA PLUS) test strip Check glucose twice a day 200 each 3  . Insulin Pen Needle (PEN NEEDLES) 32G X 4 MM MISC 1 each by Does not apply route 2 (two) times daily. 90 each 3  . ipratropium-albuterol (DUONEB) 0.5-2.5 (3) MG/3ML SOLN Take 3 mLs by nebulization every 4 (four) hours as needed. 360 mL 0  . losartan (COZAAR) 50 MG tablet Take 1 tablet (50 mg total) by mouth daily. 90 tablet 1  . memantine (NAMENDA) 10 MG tablet Take 1 tablet (10 mg total) by mouth 2 (two) times daily. 180 tablet 3  . sildenafil (REVATIO) 20 MG tablet Take 2-5 pills at once, orally, with each sexual encounter 50 tablet 5  . TRESIBA FLEXTOUCH 100 UNIT/ML FlexTouch Pen Inject 0.2 mLs (20 Units total) into the skin daily at 10 pm.  5 pen 3   No current facility-administered medications on file prior to visit.    ROS Review of Systems  Constitutional: Negative for fever.  Respiratory: Negative for shortness of breath.   Cardiovascular: Negative for chest pain.  Musculoskeletal: Negative for arthralgias.  Skin: Negative for rash.    Objective:  BP 136/86   Pulse 70   Temp (!) 97.3 F (36.3 C) (Temporal)    Resp 20   Ht 6' (1.829 m)   Wt 190 lb 2 oz (86.2 kg)   SpO2 98%   BMI 25.79 kg/m   BP Readings from Last 3 Encounters:  07/21/20 136/86  06/22/20 114/68  04/29/20 (!) 125/110    Wt Readings from Last 3 Encounters:  07/21/20 190 lb 2 oz (86.2 kg)  06/22/20 190 lb 6.4 oz (86.4 kg)  04/29/20 194 lb 0.1 oz (88 kg)     Physical Exam Vitals reviewed.  Constitutional:      Appearance: He is well-developed and well-nourished.  HENT:     Head: Normocephalic and atraumatic.     Right Ear: Tympanic membrane and external ear normal. No decreased hearing noted.     Left Ear: Tympanic membrane and external ear normal. No decreased hearing noted.     Mouth/Throat:     Pharynx: No oropharyngeal exudate or posterior oropharyngeal erythema.  Eyes:     Pupils: Pupils are equal, round, and reactive to light.  Cardiovascular:     Rate and Rhythm: Normal rate and regular rhythm.     Heart sounds: No murmur heard.   Pulmonary:     Effort: No respiratory distress.     Breath sounds: Normal breath sounds.  Abdominal:     General: Bowel sounds are normal.     Palpations: Abdomen is soft. There is no mass.     Tenderness: There is no abdominal tenderness.  Musculoskeletal:     Cervical back: Normal range of motion and neck supple.  Neurological:     Mental Status: He is alert and oriented to person, place, and time.     Diabetic Foot Exam - Simple   No data filed       Assessment & Plan:   Robbert was seen today for medical management of chronic issues.  Diagnoses and all orders for this visit:  Hyperlipidemia associated with type 2 diabetes mellitus (HCC) -     CBC with Differential/Platelet -     CMP14+EGFR -     Lipid panel  Type 2 diabetes mellitus with other specified complication, with long-term current use of insulin (HCC) -     Microalbumin / creatinine urine ratio -     Bayer DCA Hb A1c Waived -     CBC with Differential/Platelet -     CMP14+EGFR -     Lipid  panel  Hypertension associated with type 2 diabetes mellitus (HCC) -     CBC with Differential/Platelet -     CMP14+EGFR -     Lipid panel  Chronic obstructive pulmonary disease, unspecified COPD type (HCC)   I am having Garen L. Muhl maintain his aspirin EC, Pen Needles, glucose blood, Accu-Chek Aviva, Accu-Chek Softclix Lancets, diclofenac Sodium, memantine, Tresiba FlexTouch, sildenafil, donepezil, losartan, albuterol, Breo Ellipta, ipratropium-albuterol, and atorvastatin.  No orders of the defined types were placed in this encounter.    Follow-up: Return in about 3 months (around 10/19/2020).   , M.D. 

## 2020-07-22 LAB — CBC WITH DIFFERENTIAL/PLATELET
Basophils Absolute: 0.1 10*3/uL (ref 0.0–0.2)
Basos: 1 %
EOS (ABSOLUTE): 0.3 10*3/uL (ref 0.0–0.4)
Eos: 4 %
Hematocrit: 38.2 % (ref 37.5–51.0)
Hemoglobin: 13.1 g/dL (ref 13.0–17.7)
Immature Grans (Abs): 0 10*3/uL (ref 0.0–0.1)
Immature Granulocytes: 0 %
Lymphocytes Absolute: 1.9 10*3/uL (ref 0.7–3.1)
Lymphs: 28 %
MCH: 30.8 pg (ref 26.6–33.0)
MCHC: 34.3 g/dL (ref 31.5–35.7)
MCV: 90 fL (ref 79–97)
Monocytes Absolute: 0.5 10*3/uL (ref 0.1–0.9)
Monocytes: 8 %
Neutrophils Absolute: 4 10*3/uL (ref 1.4–7.0)
Neutrophils: 59 %
Platelets: 206 10*3/uL (ref 150–450)
RBC: 4.25 x10E6/uL (ref 4.14–5.80)
RDW: 12.4 % (ref 11.6–15.4)
WBC: 6.7 10*3/uL (ref 3.4–10.8)

## 2020-07-22 LAB — LIPID PANEL
Chol/HDL Ratio: 4.1 ratio (ref 0.0–5.0)
Cholesterol, Total: 144 mg/dL (ref 100–199)
HDL: 35 mg/dL — ABNORMAL LOW (ref >39)
LDL Chol Calc (NIH): 81 mg/dL (ref 0–99)
Triglycerides: 163 mg/dL — ABNORMAL HIGH (ref 0–149)
VLDL Cholesterol Cal: 28 mg/dL (ref 5–40)

## 2020-07-22 LAB — CMP14+EGFR
ALT: 10 IU/L (ref 0–44)
AST: 15 IU/L (ref 0–40)
Albumin/Globulin Ratio: 1.5 (ref 1.2–2.2)
Albumin: 4.3 g/dL (ref 3.6–4.6)
Alkaline Phosphatase: 91 IU/L (ref 44–121)
BUN/Creatinine Ratio: 10 (ref 10–24)
BUN: 14 mg/dL (ref 8–27)
Bilirubin Total: 0.6 mg/dL (ref 0.0–1.2)
CO2: 22 mmol/L (ref 20–29)
Calcium: 9.3 mg/dL (ref 8.6–10.2)
Chloride: 104 mmol/L (ref 96–106)
Creatinine, Ser: 1.37 mg/dL — ABNORMAL HIGH (ref 0.76–1.27)
GFR calc Af Amer: 55 mL/min/{1.73_m2} — ABNORMAL LOW (ref >59)
GFR calc non Af Amer: 48 mL/min/{1.73_m2} — ABNORMAL LOW (ref >59)
Globulin, Total: 2.8 g/dL (ref 1.5–4.5)
Glucose: 183 mg/dL — ABNORMAL HIGH (ref 65–99)
Potassium: 4.3 mmol/L (ref 3.5–5.2)
Sodium: 141 mmol/L (ref 134–144)
Total Protein: 7.1 g/dL (ref 6.0–8.5)

## 2020-07-24 ENCOUNTER — Encounter: Payer: Self-pay | Admitting: Family Medicine

## 2020-07-26 NOTE — Progress Notes (Signed)
Hello Chavis,  Your lab result is normal and/or stable.Some minor variations that are not significant are commonly marked abnormal, but do not represent any medical problem for you.  Best regards, Elois Averitt, M.D.

## 2020-07-27 ENCOUNTER — Telehealth: Payer: Medicare HMO

## 2020-08-09 ENCOUNTER — Other Ambulatory Visit: Payer: Self-pay | Admitting: *Deleted

## 2020-08-09 DIAGNOSIS — F0150 Vascular dementia without behavioral disturbance: Secondary | ICD-10-CM

## 2020-08-09 MED ORDER — MEMANTINE HCL 10 MG PO TABS: 10.0000 mg | ORAL_TABLET | Freq: Two times a day (BID) | ORAL | 0 refills | Status: DC

## 2020-08-26 ENCOUNTER — Telehealth: Payer: Medicare HMO

## 2020-09-27 ENCOUNTER — Telehealth: Payer: Medicare HMO

## 2020-10-19 ENCOUNTER — Encounter: Payer: Self-pay | Admitting: Family Medicine

## 2020-10-19 ENCOUNTER — Ambulatory Visit (INDEPENDENT_AMBULATORY_CARE_PROVIDER_SITE_OTHER): Payer: Medicare HMO | Admitting: Family Medicine

## 2020-10-19 ENCOUNTER — Other Ambulatory Visit: Payer: Self-pay

## 2020-10-19 VITALS — BP 134/76 | HR 71 | Temp 97.4°F | Ht 72.0 in | Wt 183.4 lb

## 2020-10-19 DIAGNOSIS — Z794 Long term (current) use of insulin: Secondary | ICD-10-CM

## 2020-10-19 DIAGNOSIS — E1169 Type 2 diabetes mellitus with other specified complication: Secondary | ICD-10-CM | POA: Diagnosis not present

## 2020-10-19 DIAGNOSIS — E785 Hyperlipidemia, unspecified: Secondary | ICD-10-CM | POA: Diagnosis not present

## 2020-10-19 LAB — BAYER DCA HB A1C WAIVED: HB A1C (BAYER DCA - WAIVED): 6.7 % (ref <7.0)

## 2020-10-19 NOTE — Progress Notes (Signed)
Subjective:  Patient ID: [name], male    DOB: [birth year]  Age: 85 y.o. MRN: [id number]  CC: Medical Management of Chronic Issues   HPI [name] presents forFollow-up of diabetes. Patient checks blood sugar at home.  Patient denies symptoms such as polyuria, polydipsia, excessive hunger, nausea No significant hypoglycemic spells noted. Medications reviewed. Pt reports taking them regularly without complication/adverse reaction being reported today.  Checking feet daily.   History [name] has a past medical history of Anxiety, Asthma, Choledocholithiasis (02/24/2013), COPD (chronic obstructive pulmonary disease) ([place]), CVA (cerebral vascular accident) ([place]) (2013), Dementia ([place]), Diabetes mellitus, TIA (transient ischemic attack), Hyperlipidemia, and Hypertension.   He has a past surgical history that includes Rotator cuff repair (Right); Cholecystectomy (N/A, 02/23/2013); ERCP (N/A, 02/24/2013); and Cataract extraction w/PHACO (Left, 04/30/2016).   His family history includes Diabetes in his sister and sister; Emphysema in his brother; Heart disease in his brother; Hypertension in his father and mother.He reports that he has never smoked. He has never used smokeless tobacco. He reports that he does not drink alcohol and does not use drugs.  Current Outpatient Medications on File Prior to Visit  Medication Sig Dispense Refill  . ACCU-CHEK SOFTCLIX LANCETS lancets Check glucose twice a day 200 each 3  . albuterol (VENTOLIN HFA) 108 (90 Base) MCG/ACT inhaler Inhale 2 puffs into the lungs every 4 (four) hours as needed for wheezing or shortness of breath. 18 g 5  . aspirin EC 81 MG tablet Take 162 mg by mouth daily.     [name] Kitchen atorvastatin (LIPITOR) 40 MG tablet Take 1 tablet (40 mg total) by mouth daily. 90 tablet 0  . Blood Glucose Calibration (ACCU-CHEK AVIVA) SOLN Use with glucometer 1 each 0  . diclofenac Sodium (VOLTAREN) 1 % GEL Apply 2 g topically 4 (four) times daily. 350 g 1   . fluticasone furoate-vilanterol (BREO ELLIPTA) 100-25 MCG/INH AEPB Inhale 1 puff into the lungs daily. 30 each 5  . glucose blood (ACCU-CHEK AVIVA PLUS) test strip Check glucose twice a day 200 each 3  . Insulin Pen Needle (PEN NEEDLES) 32G X 4 MM MISC 1 each by Does not apply route 2 (two) times daily. 90 each 3  . ipratropium-albuterol (DUONEB) 0.5-2.5 (3) MG/3ML SOLN Take 3 mLs by nebulization every 4 (four) hours as needed. 360 mL 0  . losartan (COZAAR) 50 MG tablet Take 1 tablet (50 mg total) by mouth daily. 90 tablet 1  . memantine (NAMENDA) 10 MG tablet Take 1 tablet (10 mg total) by mouth 2 (two) times daily. 180 tablet 0  . sildenafil (REVATIO) 20 MG tablet Take 2-5 pills at once, orally, with each sexual encounter 50 tablet 5  . TRESIBA FLEXTOUCH 100 UNIT/ML FlexTouch Pen Inject 0.2 mLs (20 Units total) into the skin daily at 10 pm. 5 pen 3  . donepezil ([name]) 10 MG tablet Take 1 tablet (10 mg total) by mouth at bedtime. 90 tablet 1   No current facility-administered medications on file prior to visit.    ROS Review of Systems  Constitutional: Negative for fever.  Respiratory: Negative for shortness of breath.   Cardiovascular: Negative for chest pain.  Musculoskeletal: Negative for arthralgias.  Skin: Negative for rash.  Psychiatric/Behavioral: Positive for confusion (wife says occasional at sundown).    Objective:  BP 134/76   Pulse 71   Temp (!) 97.4 F (36.3 C)   Ht 6' (1.829 m)   Wt 183 lb 6.4 oz (83.2 kg)  SpO2 100%   BMI 24.87 kg/m   BP Readings from Last 3 Encounters:  10/19/20 134/76  07/21/20 136/86  06/22/20 114/68    Wt Readings from Last 3 Encounters:  10/19/20 183 lb 6.4 oz (83.2 kg)  07/21/20 190 lb 2 oz (86.2 kg)  06/22/20 190 lb 6.4 oz (86.4 kg)     Physical Exam    Assessment & Plan:   [name] was seen today for medical management of chronic issues.  Diagnoses and all orders for this visit:  Type 2 diabetes mellitus with other  specified complication, with long-term current use of insulin ([place]) -     Bayer DCA Hb A1c Waived -     CBC with Differential/Platelet -     CMP14+EGFR  Hyperlipidemia associated with type 2 diabetes mellitus ([place]) -     Lipid panel      I am having [name] maintain his aspirin EC, Pen Needles, glucose blood, Accu-Chek Aviva, Accu-Chek Softclix Lancets, diclofenac Sodium, Tresiba FlexTouch, sildenafil, donepezil, losartan, albuterol, Breo Ellipta, ipratropium-albuterol, atorvastatin, and memantine.  No orders of the defined types were placed in this encounter.    Follow-up: Return in about 3 months (around 01/18/2021).  Claretta Fraise, M.D.

## 2020-10-20 LAB — CBC WITH DIFFERENTIAL/PLATELET
Basophils Absolute: 0.1 10*3/uL (ref 0.0–0.2)
Basos: 1 %
EOS (ABSOLUTE): 0.3 10*3/uL (ref 0.0–0.4)
Eos: 4 %
Hematocrit: 35.9 % — ABNORMAL LOW (ref 37.5–51.0)
Hemoglobin: 12.6 g/dL — ABNORMAL LOW (ref 13.0–17.7)
Immature Grans (Abs): 0 10*3/uL (ref 0.0–0.1)
Immature Granulocytes: 0 %
Lymphocytes Absolute: 1.9 10*3/uL (ref 0.7–3.1)
Lymphs: 26 %
MCH: 30.3 pg (ref 26.6–33.0)
MCHC: 35.1 g/dL (ref 31.5–35.7)
MCV: 86 fL (ref 79–97)
Monocytes Absolute: 0.6 10*3/uL (ref 0.1–0.9)
Monocytes: 7 %
Neutrophils Absolute: 4.7 10*3/uL (ref 1.4–7.0)
Neutrophils: 62 %
Platelets: 185 10*3/uL (ref 150–450)
RBC: 4.16 x10E6/uL (ref 4.14–5.80)
RDW: 12.4 % (ref 11.6–15.4)
WBC: 7.6 10*3/uL (ref 3.4–10.8)

## 2020-10-20 LAB — LIPID PANEL
Chol/HDL Ratio: 2.8 ratio (ref 0.0–5.0)
Cholesterol, Total: 110 mg/dL (ref 100–199)
HDL: 39 mg/dL — ABNORMAL LOW (ref >39)
LDL Chol Calc (NIH): 52 mg/dL (ref 0–99)
Triglycerides: 97 mg/dL (ref 0–149)
VLDL Cholesterol Cal: 19 mg/dL (ref 5–40)

## 2020-10-20 LAB — CMP14+EGFR
ALT: 9 IU/L (ref 0–44)
AST: 10 IU/L (ref 0–40)
Albumin/Globulin Ratio: 1.5 (ref 1.2–2.2)
Albumin: 4.4 g/dL (ref 3.6–4.6)
Alkaline Phosphatase: 93 IU/L (ref 44–121)
BUN/Creatinine Ratio: 11 (ref 10–24)
BUN: 15 mg/dL (ref 8–27)
Bilirubin Total: 0.7 mg/dL (ref 0.0–1.2)
CO2: 20 mmol/L (ref 20–29)
Calcium: 8.9 mg/dL (ref 8.6–10.2)
Chloride: 104 mmol/L (ref 96–106)
Creatinine, Ser: 1.37 mg/dL — ABNORMAL HIGH (ref 0.76–1.27)
Globulin, Total: 3 g/dL (ref 1.5–4.5)
Glucose: 169 mg/dL — ABNORMAL HIGH (ref 65–99)
Potassium: 4.1 mmol/L (ref 3.5–5.2)
Sodium: 141 mmol/L (ref 134–144)
Total Protein: 7.4 g/dL (ref 6.0–8.5)
eGFR: 52 mL/min/{1.73_m2} — ABNORMAL LOW (ref >59)

## 2020-10-24 NOTE — Progress Notes (Signed)
Hello Pinchos,  Your lab result is normal and/or stable.Some minor variations that are not significant are commonly marked abnormal, but do not represent any medical problem for you.  Best regards, Josiah Nieto, M.D.

## 2020-10-28 ENCOUNTER — Other Ambulatory Visit: Payer: Self-pay | Admitting: *Deleted

## 2020-10-28 MED ORDER — ACCU-CHEK GUIDE VI STRP: ORAL_STRIP | 3 refills | Status: DC

## 2020-10-28 MED ORDER — ACCU-CHEK GUIDE W/DEVICE KIT: PACK | 0 refills | Status: DC

## 2020-10-28 MED ORDER — ACCU-CHEK SOFTCLIX LANCETS MISC
3 refills | Status: DC
Start: 2020-10-28 — End: 2022-01-02

## 2020-10-28 MED ORDER — BD SWAB SINGLE USE REGULAR PADS: MEDICATED_PAD | 3 refills | Status: DC

## 2020-10-28 MED ORDER — ACCU-CHEK AVIVA VI SOLN
0 refills | Status: DC
Start: 2020-10-28 — End: 2024-03-30

## 2020-11-02 ENCOUNTER — Telehealth: Payer: Self-pay | Admitting: *Deleted

## 2020-11-02 ENCOUNTER — Telehealth: Payer: Self-pay

## 2020-11-02 ENCOUNTER — Other Ambulatory Visit: Payer: Self-pay | Admitting: Family Medicine

## 2020-11-02 DIAGNOSIS — F0150 Vascular dementia without behavioral disturbance: Secondary | ICD-10-CM

## 2020-11-02 MED ORDER — DONEPEZIL HCL 23 MG PO TABS: 23.0000 mg | ORAL_TABLET | Freq: Every day | ORAL | 1 refills | Status: DC

## 2020-11-02 NOTE — Telephone Encounter (Signed)
I sent in a dose increase to [facility] for the aricept. Until it comes he can take the current dose BID

## 2020-11-02 NOTE — Telephone Encounter (Signed)
Cancel rx at [facility] and send to [facility]

## 2020-11-02 NOTE — Telephone Encounter (Signed)
Patient's wife would like to know if there is anything else patient can take or if dosage could be increased for Namenda and/or [name].  She feels that his dementia is worsening.  Please advise.  Patient uses [facility].

## 2020-11-02 NOTE — Telephone Encounter (Signed)
No answer, no voicemail.

## 2020-11-02 NOTE — Telephone Encounter (Signed)
Done

## 2020-11-02 NOTE — Telephone Encounter (Signed)
Key: [id number] - PA Case ID: [id number] - Rx #: [id number]  Drug Donepezil HCl 23MG  tablets Form [facility] Electronic PA Form Original Claim Info MR,[id number] Try Pedro Earls [id number] For [facility] Pia Mau of: $112.53 submit to [facility]: [id number] PCN: CP Group: [id number] --Service provided at no cost and no switch fee to the pharmacy--  Sent to Plan today

## 2020-11-03 ENCOUNTER — Telehealth: Payer: Medicare HMO

## 2020-11-03 NOTE — Telephone Encounter (Signed)
PA Case: [id number], Status: Approved, Coverage Starts on: 07/09/2020 12:00:00 AM, Coverage Ends on: 07/08/2021 12:00:00 AM. Questions? Contact [phone].  Pharmacy and pt aware

## 2020-11-09 ENCOUNTER — Telehealth: Payer: Self-pay

## 2020-11-09 NOTE — Telephone Encounter (Signed)
Wife called in and said if we send referral to health dept for [name] it would be free

## 2020-11-09 NOTE — Telephone Encounter (Signed)
Pts wife called stating that she needs [place] to void the [name] Rx that was sent to [facility], and send it to [facility] because it will be a lot cheaper for them if sent to [facility].  Please advise and call wife [name])

## 2020-11-09 NOTE — Telephone Encounter (Signed)
Form filled out for pt assist program & placed on providers desk

## 2020-11-14 NOTE — Telephone Encounter (Signed)
Form faxed on 11/11/20

## 2020-11-15 ENCOUNTER — Ambulatory Visit (INDEPENDENT_AMBULATORY_CARE_PROVIDER_SITE_OTHER): Payer: Medicare HMO | Admitting: Family Medicine

## 2020-11-15 ENCOUNTER — Encounter: Payer: Self-pay | Admitting: Family Medicine

## 2020-11-15 ENCOUNTER — Other Ambulatory Visit: Payer: Self-pay

## 2020-11-15 VITALS — BP 131/69 | HR 83 | Temp 97.9°F | Ht 72.0 in | Wt 183.8 lb

## 2020-11-15 DIAGNOSIS — J069 Acute upper respiratory infection, unspecified: Secondary | ICD-10-CM | POA: Diagnosis not present

## 2020-11-15 DIAGNOSIS — R059 Cough, unspecified: Secondary | ICD-10-CM

## 2020-11-15 DIAGNOSIS — J301 Allergic rhinitis due to pollen: Secondary | ICD-10-CM

## 2020-11-15 MED ORDER — BETAMETHASONE SOD PHOS & ACET 6 (3-3) MG/ML IJ SUSP
12.0000 mg | Freq: Once | INTRAMUSCULAR | Status: AC
  Administered 2020-11-15: 12 mg via INTRAMUSCULAR

## 2020-11-15 NOTE — Progress Notes (Signed)
Subjective:  Patient ID: [name], male    DOB: [birth year]  Age: 85 y.o. MRN: [id number]  CC: Cough and Sore Throat   HPI ERHARDT DADA presents for cough and sore throat yesterday. Negative home covid test. JUst didn't feel good. Cough is dry. No fever. Denies upset stomach, vomiting and diarrhea. Appetite pretty good. Has had a runny nose.     History Raquon has a past medical history of Anxiety, Asthma, Choledocholithiasis (02/24/2013), COPD (chronic obstructive pulmonary disease) ([place]), CVA (cerebral vascular accident) ([place]) (2013), Dementia ([place]), Diabetes mellitus, TIA (transient ischemic attack), Hyperlipidemia, and Hypertension.   He has a past surgical history that includes Rotator cuff repair (Right); Cholecystectomy (N/A, 02/23/2013); ERCP (N/A, 02/24/2013); and Cataract extraction w/PHACO (Left, 04/30/2016).   His family history includes Diabetes in his sister and sister; Emphysema in his brother; Heart disease in his brother; Hypertension in his father and mother.He reports that he has never smoked. He has never used smokeless tobacco. He reports that he does not drink alcohol and does not use drugs.    ROS Review of Systems  Constitutional: Negative for activity change, appetite change, chills and fever.  HENT: Positive for congestion, rhinorrhea and sore throat. Negative for ear pain, postnasal drip, sinus pressure, sneezing and trouble swallowing.   Respiratory: Positive for cough. Negative for chest tightness, shortness of breath and wheezing.   Cardiovascular: Negative for chest pain.  Gastrointestinal: Negative for abdominal pain, diarrhea, nausea and vomiting.  Genitourinary: Negative for dysuria.  Skin: Negative for rash.    Objective:  BP 131/69   Pulse 83   Temp 97.9 F (36.6 C)   Ht 6' (1.829 m)   Wt 183 lb 12.8 oz (83.4 kg)   SpO2 100%   BMI 24.93 kg/m   BP Readings from Last 3 Encounters:  11/15/20 131/69  10/19/20 134/76  07/21/20 136/86     Wt Readings from Last 3 Encounters:  11/15/20 183 lb 12.8 oz (83.4 kg)  10/19/20 183 lb 6.4 oz (83.2 kg)  07/21/20 190 lb 2 oz (86.2 kg)     Physical Exam Constitutional:      Appearance: He is well-developed.  HENT:     Head: Normocephalic and atraumatic.     Right Ear: Tympanic membrane and external ear normal. No decreased hearing noted.     Left Ear: Tympanic membrane and external ear normal. No decreased hearing noted.     Nose: Mucosal edema and congestion (with pallor) present.     Right Sinus: No frontal sinus tenderness.     Left Sinus: No frontal sinus tenderness.     Mouth/Throat:     Mouth: Mucous membranes are moist.     Pharynx: No oropharyngeal exudate or posterior oropharyngeal erythema.  Neck:     Thyroid: No thyromegaly.     Meningeal: Brudzinski's sign absent.  Cardiovascular:     Rate and Rhythm: Normal rate and regular rhythm.  Pulmonary:     Effort: No respiratory distress.     Breath sounds: Normal breath sounds.  Musculoskeletal:     Cervical back: Normal range of motion and neck supple.  Lymphadenopathy:     Head:     Right side of head: No preauricular adenopathy.     Left side of head: No preauricular adenopathy.     Cervical: No cervical adenopathy.     Right cervical: No superficial cervical adenopathy.    Left cervical: No superficial cervical adenopathy.  Assessment & Plan:   Rubens was seen today for cough and sore throat.  Diagnoses and all orders for this visit:  Cough -     Novel Coronavirus, NAA ([facility]) -     betamethasone acetate-betamethasone sodium phosphate (CELESTONE) injection 12 mg  Seasonal allergic rhinitis due to pollen  Viral URI       I am having Namari L. Kliethermes maintain his aspirin EC, Pen Needles, diclofenac Sodium, Tyler Aas FlexTouch, sildenafil, losartan, albuterol, Breo Ellipta, ipratropium-albuterol, atorvastatin, memantine, Accu-Chek Aviva, Accu-Chek Softclix Lancets, B-D SINGLE USE SWABS  REGULAR, Accu-Chek Guide, Accu-Chek Guide, and donepezil. We administered betamethasone acetate-betamethasone sodium phosphate.  Allergies as of 11/15/2020      Reactions   Ace Inhibitors Swelling      Medication List       Accurate as of Nov 15, 2020  3:02 PM. If you have any questions, ask your nurse or doctor.        Accu-Chek Aviva Soln Use with glucometer Dx E11.9   Accu-Chek Guide test strip Generic drug: glucose blood Check glucose twice a day Dx E11.9   Accu-Chek Guide w/Device Kit Check glucose twice a day Dx E11.9   Accu-Chek Softclix Lancets lancets Check glucose twice a day Dx E11.9   albuterol 108 (90 Base) MCG/ACT inhaler Commonly known as: VENTOLIN HFA Inhale 2 puffs into the lungs every 4 (four) hours as needed for wheezing or shortness of breath.   aspirin EC 81 MG tablet Take 162 mg by mouth daily.   atorvastatin 40 MG tablet Commonly known as: LIPITOR Take 1 tablet (40 mg total) by mouth daily.   B-D SINGLE USE SWABS REGULAR Pads Check glucose twice a day Dx E11.9   Breo Ellipta 100-25 MCG/INH Aepb Generic drug: fluticasone furoate-vilanterol Inhale 1 puff into the lungs daily.   diclofenac Sodium 1 % Gel Commonly known as: Voltaren Apply 2 g topically 4 (four) times daily.   donepezil 23 MG Tabs tablet Commonly known as: [name] Take 1 tablet (23 mg total) by mouth at bedtime.   ipratropium-albuterol 0.5-2.5 (3) MG/3ML Soln Commonly known as: DUONEB Take 3 mLs by nebulization every 4 (four) hours as needed.   losartan 50 MG tablet Commonly known as: COZAAR Take 1 tablet (50 mg total) by mouth daily.   memantine 10 MG tablet Commonly known as: NAMENDA Take 1 tablet (10 mg total) by mouth 2 (two) times daily.   Pen Needles 32G X 4 MM Misc 1 each by Does not apply route 2 (two) times daily.   sildenafil 20 MG tablet Commonly known as: REVATIO Take 2-5 pills at once, orally, with each sexual encounter   Tresiba FlexTouch 100  UNIT/ML FlexTouch Pen Generic drug: insulin degludec Inject 0.2 mLs (20 Units total) into the skin daily at 10 pm.        Follow-up: No follow-ups on file.  Claretta Fraise, M.D.

## 2020-11-16 LAB — SARS-COV-2, NAA 2 DAY TAT

## 2020-11-16 LAB — NOVEL CORONAVIRUS, NAA: SARS-CoV-2, NAA: NOT DETECTED

## 2020-12-12 ENCOUNTER — Telehealth: Payer: Medicare HMO

## 2020-12-24 ENCOUNTER — Emergency Department (HOSPITAL_COMMUNITY)
Admission: EM | Admit: 2020-12-24 | Discharge: 2020-12-24 | Disposition: A | Discharge status: Home / Self Care | Payer: Medicare HMO | Attending: Emergency Medicine | Admitting: Emergency Medicine

## 2020-12-24 ENCOUNTER — Other Ambulatory Visit: Payer: Self-pay

## 2020-12-24 ENCOUNTER — Encounter (HOSPITAL_COMMUNITY): Payer: Self-pay | Admitting: Emergency Medicine

## 2020-12-24 DIAGNOSIS — Z794 Long term (current) use of insulin: Secondary | ICD-10-CM | POA: Insufficient documentation

## 2020-12-24 DIAGNOSIS — F0390 Unspecified dementia without behavioral disturbance: Secondary | ICD-10-CM | POA: Diagnosis not present

## 2020-12-24 DIAGNOSIS — Z7951 Long term (current) use of inhaled steroids: Secondary | ICD-10-CM | POA: Insufficient documentation

## 2020-12-24 DIAGNOSIS — H6121 Impacted cerumen, right ear: Secondary | ICD-10-CM | POA: Diagnosis not present

## 2020-12-24 DIAGNOSIS — E119 Type 2 diabetes mellitus without complications: Secondary | ICD-10-CM | POA: Insufficient documentation

## 2020-12-24 DIAGNOSIS — J45909 Unspecified asthma, uncomplicated: Secondary | ICD-10-CM | POA: Diagnosis not present

## 2020-12-24 DIAGNOSIS — Z79899 Other long term (current) drug therapy: Secondary | ICD-10-CM | POA: Insufficient documentation

## 2020-12-24 DIAGNOSIS — Z7982 Long term (current) use of aspirin: Secondary | ICD-10-CM | POA: Insufficient documentation

## 2020-12-24 DIAGNOSIS — J449 Chronic obstructive pulmonary disease, unspecified: Secondary | ICD-10-CM | POA: Insufficient documentation

## 2020-12-24 DIAGNOSIS — I10 Essential (primary) hypertension: Secondary | ICD-10-CM | POA: Insufficient documentation

## 2020-12-24 DIAGNOSIS — H9201 Otalgia, right ear: Secondary | ICD-10-CM | POA: Diagnosis present

## 2020-12-24 NOTE — ED Triage Notes (Signed)
Pt c/o a foreign body in his right ear since today. Wife states that pt had paper towel in his ear

## 2020-12-24 NOTE — Discharge Instructions (Addendum)
We were able to remove the excess amount of earwax in her ear.  I suspect this is what was causing your  you to have decrease in hearing.  Please do not place anything in your ear as this will push wax back into it.  You may buy over-the-counter earwax dissolving use at your local pharmacy.  Please follow-up with your PCP for further evaluation.  Come back to the emergency department if you develop chest pain, shortness of breath, severe abdominal pain, uncontrolled nausea, vomiting, diarrhea.

## 2020-12-24 NOTE — ED Provider Notes (Signed)
[facility] EMERGENCY DEPARTMENT Provider Note   CSN: [id number] Arrival date & time: 12/24/20  1841     History Chief Complaint  Patient presents with   Foreign Body in [name] is a 85 y.o. male.  HPI  Patient with significant medical history of anxiety, asthma, choledocholithiasis, COPD, dementia, TIA presents to the emergency department with chief complaint of right-sided ear pain.  Patient states he hears a fluttering in his right ear, thinks maybe something went to his ear, this started today.  He has tried to get it out but has been unsuccessful.  He denies ear drainage, recent trauma to the area, he denies fevers, chills, nasal congestion, sore throat, cough, stomach pain, nausea, vomiting, general body aches.  He denies any recent sick contacts.  Patient states he has never had this in the past.  He denies alleviating factors.  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/31/2015   Dementia without behavioral disturbance (HCC)    Atelectasis of right lung 01/29/2014   Pleural effusion on right 11/24/2013   Late effect of cerebrovascular accident (CVA)    Diabetes mellitus ([place])    Hypertension associated with type 2 diabetes mellitus ([place])    Hx-TIA (transient ischemic attack)     Past Surgical History:  Procedure Laterality Date   CATARACT EXTRACTION W/PHACO Left 04/30/2016   Procedure: CATARACT EXTRACTION PHACO AND INTRAOCULAR LENS PLACEMENT LEFT EYE CDE=9.83;   Surgeon: Tonny Branch, MD;  Location: [facility];  Service: Ophthalmology;  Laterality: Left;  left   CHOLECYSTECTOMY N/A 02/23/2013   Procedure: LAPAROSCOPIC CHOLECYSTECTOMY;  Surgeon: Jamesetta So, MD;  Location: [facility];  Service: General;  Laterality: N/A;   ERCP N/A 02/24/2013   Procedure: ENDOSCOPIC RETROGRADE CHOLANGIOPANCREATOGRAPHY;  Surgeon: Daneil Dolin, MD;  Location: [facility];  Service: Endoscopy;  Laterality: N/A;   ROTATOR CUFF REPAIR Right        Family History  Problem Relation Age of Onset   Hypertension Mother    Hypertension Father    Diabetes Sister    Diabetes Sister    Heart disease Brother    Emphysema Brother    Colon cancer Neg Hx     Social History   Tobacco Use   Smoking status: Never   Smokeless tobacco: Never  Vaping Use   Vaping Use: Never used  Substance Use Topics   Alcohol use: No   Drug use: No    Home Medications Prior to Admission medications   Medication Sig Start Date End Date Taking? Authorizing Provider  Accu-Chek Softclix Lancets lancets Check glucose twice a day Dx E11.9 10/28/20   Claretta Fraise, MD  albuterol (VENTOLIN HFA) 108 (90 Base) MCG/ACT inhaler Inhale 2 puffs into the lungs every 4 (four) hours as needed for wheezing or shortness of breath. 04/12/20   Claretta Fraise, MD  Alcohol Swabs (B-D SINGLE USE SWABS REGULAR) PADS Check glucose  twice a day Dx E11.9 10/28/20   Claretta Fraise, MD  aspirin EC 81 MG tablet Take 162 mg by mouth daily.     [provider]  atorvastatin (LIPITOR) 40 MG tablet Take 1 tablet (40 mg total) by mouth daily. 07/12/20   Claretta Fraise, MD  Blood Glucose Calibration (ACCU-CHEK AVIVA) SOLN Use with glucometer Dx E11.9 10/28/20   Claretta Fraise, MD  Blood Glucose Monitoring Suppl (ACCU-CHEK GUIDE) w/Device KIT Check glucose twice a day Dx E11.9 10/28/20   Claretta Fraise, MD  diclofenac Sodium (VOLTAREN) 1 % GEL Apply 2 g topically 4 (four) times daily. 05/22/19   Baruch Gouty, [name]  donepezil  ([name]) 23 MG TABS tablet Take 1 tablet (23 mg total) by mouth at bedtime. 11/02/20 12/02/20  Claretta Fraise, MD  fluticasone furoate-vilanterol (BREO ELLIPTA) 100-25 MCG/INH AEPB Inhale 1 puff into the lungs daily. 04/12/20   Claretta Fraise, MD  glucose blood (ACCU-CHEK GUIDE) test strip Check glucose twice a day Dx E11.9 10/28/20   Claretta Fraise, MD  Insulin Pen Needle (PEN NEEDLES) 32G X 4 MM MISC 1 each by Does not apply route 2 (two) times daily. 04/09/18   Eustaquio Maize, MD  ipratropium-albuterol (DUONEB) 0.5-2.5 (3) MG/3ML SOLN Take 3 mLs by nebulization every 4 (four) hours as needed. 04/29/20   Jacqlyn Larsen, PA-C  losartan (COZAAR) 50 MG tablet Take 1 tablet (50 mg total) by mouth daily. 04/12/20   Claretta Fraise, MD  memantine (NAMENDA) 10 MG tablet Take 1 tablet (10 mg total) by mouth 2 (two) times daily. 08/09/20   Claretta Fraise, MD  sildenafil (REVATIO) 20 MG tablet Take 2-5 pills at once, orally, with each sexual encounter 04/12/20   Claretta Fraise, MD  TRESIBA FLEXTOUCH 100 UNIT/ML FlexTouch Pen Inject 0.2 mLs (20 Units total) into the skin daily at 10 pm. 01/05/20   Claretta Fraise, MD    Allergies    Ace inhibitors  Review of Systems   Review of Systems  Constitutional:  Negative for chills and fever.  HENT:  Positive for ear pain. Negative for congestion, ear discharge and sore throat.   Respiratory:  Negative for cough.   Cardiovascular:  Negative for chest pain.  Gastrointestinal:  Negative for abdominal pain.  Genitourinary:  Negative for enuresis.  Musculoskeletal:  Negative for back pain.  Skin:  Negative for rash.  Neurological:  Negative for headaches.  Hematological:  Does not bruise/bleed easily.   Physical Exam Updated Vital Signs BP 130/71   Pulse 70   Temp 98.2 F (36.8 C)   Resp 18   Ht 6' (1.829 m)   Wt 83.4 kg   SpO2 99%   BMI 24.94 kg/m   Physical Exam Vitals and nursing note reviewed.  Constitutional:      General: He is not in acute  distress.    Appearance: He is not ill-appearing.  HENT:     Head: Normocephalic and atraumatic.     Right Ear: There is impacted cerumen.     Left Ear: Tympanic membrane, ear canal and external ear normal.     Nose: No congestion.  Eyes:     Conjunctiva/sclera: Conjunctivae normal.  Cardiovascular:     Rate and Rhythm: Normal rate and regular rhythm.     Pulses: Normal pulses.     Heart sounds: No murmur heard.   No friction rub. No gallop.  Pulmonary:     Effort: No respiratory distress.     Breath sounds: No  wheezing, rhonchi or rales.  Skin:    General: Skin is warm and dry.  Neurological:     Mental Status: He is alert.  Psychiatric:        Mood and Affect: Mood normal.    ED Results / Procedures / Treatments   Labs (all labs ordered are listed, but only abnormal results are displayed) Labs Reviewed - No data to display  EKG None  Radiology No results found.  Procedures .Ear Cerumen Removal  Date/Time: 12/24/2020 11:01 PM Performed by: Marcello Fennel, PA-C Authorized by: Marcello Fennel, PA-C   Consent:    Consent obtained:  Verbal   Consent given by:  Patient   Risks, benefits, and alternatives were discussed: yes     Risks discussed:  Bleeding, infection, pain, TM perforation, incomplete removal and dizziness   Alternatives discussed:  No treatment Universal protocol:    Patient identity confirmed:  Verbally with patient Procedure details:    Location:  R ear   Procedure type: irrigation     Procedure outcomes: cerumen removed   Post-procedure details:    Inspection:  Ear canal clear, TM intact and no bleeding   Hearing quality:  Improved   Procedure completion:  Tolerated well, no immediate complications   Medications Ordered in ED Medications - No data to display  ED Course  I have reviewed the triage vital signs and the nursing notes.  Pertinent labs & imaging results that were available during my care of the patient were reviewed by  me and considered in my medical decision making (see chart for details).    MDM Rules/Calculators/A&P                         Initial impression-patient presents with right ear pain.  He is alert, does not appear to be in acute stress, vital signs reassuring.  Exam reveals he has  cerumen impaction suspect this caused him to have discomfort, will recommend cerumen disimpact meant.  Work-up-due to well-appearing patient, benign for exam, further lab or imaging not warranted at this time.  Reassessment-patient was agreeable to disimpact, using hydroperoxide and normal saline and an 18-gauge syringe was able to flush out large amount of cerumen.  Using otoscope ear was examined there is no erythema or bulging TMs, ear canal did not show any signs of infection.  Patient tolerated the procedure well, states he feels much better has no complaints this time.  Patient agreed for discharge.  Rule out-low suspicion for otitis externa or otitis media as there is no signs of infection present my exam.  Low suspicion for mastoiditis as there is no ear protrusion, mastoids are nontender to palpation.  Low suspicion for ruptured TM as TMs are intact.  Plan-  Right-sided ear pain since resolved-suspect this secondary due to cerumen impaction.  Recommend that he does not place anything in his ear and use over-the-counter cerumen softener.  Follow-up with PCP as needed.  Vital signs have remained stable, no indication for hospital admission. Patient given at home care as well strict return precautions.  Patient verbalized that they understood agreed to said plan.  Final Clinical Impression(s) / ED Diagnoses Final diagnoses:  Impacted cerumen of right ear    Rx / DC Orders ED Discharge Orders     None        Marcello Fennel, PA-C 12/24/20 [id number]    Fredia Sorrow, MD 12/30/20 [phone]

## 2020-12-29 ENCOUNTER — Ambulatory Visit (INDEPENDENT_AMBULATORY_CARE_PROVIDER_SITE_OTHER): Payer: Medicare HMO

## 2020-12-29 VITALS — Ht 72.0 in | Wt 183.0 lb

## 2020-12-29 DIAGNOSIS — Z0000 Encounter for general adult medical examination without abnormal findings: Secondary | ICD-10-CM | POA: Diagnosis not present

## 2020-12-29 NOTE — Progress Notes (Signed)
Subjective:   [name] is a 85 y.o. male who presents for Medicare Annual/Subsequent preventive examination.  Virtual Visit via Telephone Note  I connected with  [name] on 12/29/20 at 11:15 AM EDT by telephone and verified that I am speaking with the correct person using two identifiers.  Location: Patient: Home Provider: [facility] Persons participating in the virtual visit: patient and wife, Lovilia Advisor   I discussed the limitations, risks, security and privacy concerns of performing an evaluation and management service by telephone and the availability of in person appointments. The patient expressed understanding and agreed to proceed.  Interactive audio and video telecommunications were attempted between this nurse and patient, however failed, due to patient having technical difficulties OR patient did not have access to video capability.  We continued and completed visit with audio only.  Some vital signs may be absent or patient reported.   Lukasz Rogus E Emaley Applin, LPN   Review of Systems     Cardiac Risk Factors include: advanced age (>2mn, >>75women);diabetes mellitus;dyslipidemia;hypertension;male gender;sedentary lifestyle     Objective:    Today's Vitals   12/29/20 1125  Weight: 183 lb (83 kg)  Height: 6' (1.829 m)  PainSc: 0-No pain   Body mass index is 24.82 kg/m.  Advanced Directives 12/29/2020 12/24/2020 04/29/2020 12/29/2019 12/26/2018 11/10/2018 10/10/2017  Does Patient Have a Medical Advance Directive? No No No No No No -  Type of Advance Directive - - - - - - -  Would patient like information on creating a medical advance directive? No - Patient declined - - No - Patient declined Yes (MAU/Ambulatory/Procedural Areas - Information given) No - Patient declined Yes (MAU/Ambulatory/Procedural Areas - Information given)  Pre-existing out of facility DNR order (yellow form or pink MOST form) - - - - - - -    Current Medications (verified) Outpatient  Encounter Medications as of 12/29/2020  Medication Sig   Accu-Chek Softclix Lancets lancets Check glucose twice a day Dx E11.9   albuterol (VENTOLIN HFA) 108 (90 Base) MCG/ACT inhaler Inhale 2 puffs into the lungs every 4 (four) hours as needed for wheezing or shortness of breath.   Alcohol Swabs (B-D SINGLE USE SWABS REGULAR) PADS Check glucose twice a day Dx E11.9   aspirin EC 81 MG tablet Take 162 mg by mouth daily.    atorvastatin (LIPITOR) 40 MG tablet Take 1 tablet (40 mg total) by mouth daily.   Blood Glucose Calibration (ACCU-CHEK AVIVA) SOLN Use with glucometer Dx E11.9   Blood Glucose Monitoring Suppl (ACCU-CHEK GUIDE) w/Device KIT Check glucose twice a day Dx E11.9   diclofenac Sodium (VOLTAREN) 1 % GEL Apply 2 g topically 4 (four) times daily.   donepezil ([name]) 23 MG TABS tablet Take 1 tablet (23 mg total) by mouth at bedtime.   fluticasone furoate-vilanterol (BREO ELLIPTA) 100-25 MCG/INH AEPB Inhale 1 puff into the lungs daily.   glucose blood (ACCU-CHEK GUIDE) test strip Check glucose twice a day Dx E11.9   Insulin Pen Needle (PEN NEEDLES) 32G X 4 MM MISC 1 each by Does not apply route 2 (two) times daily.   ipratropium-albuterol (DUONEB) 0.5-2.5 (3) MG/3ML SOLN Take 3 mLs by nebulization every 4 (four) hours as needed.   losartan (COZAAR) 50 MG tablet Take 1 tablet (50 mg total) by mouth daily.   memantine (NAMENDA) 10 MG tablet Take 1 tablet (10 mg total) by mouth 2 (two) times daily.   sildenafil (REVATIO) 20 MG tablet Take 2-5 pills  at once, orally, with each sexual encounter   TRESIBA FLEXTOUCH 100 UNIT/ML FlexTouch Pen Inject 0.2 mLs (20 Units total) into the skin daily at 10 pm.   No facility-administered encounter medications on file as of 12/29/2020.    Allergies (verified) Ace inhibitors   History: Past Medical History:  Diagnosis Date   Anxiety    Asthma    Choledocholithiasis 02/24/2013   COPD (chronic obstructive pulmonary disease) (HCC)    CVA (cerebral  vascular accident) ([place]) 2013   loss of memory   Dementia ([place])    Diabetes mellitus    Hx-TIA (transient ischemic attack)    Hyperlipidemia    Hypertension    Past Surgical History:  Procedure Laterality Date   CATARACT EXTRACTION W/PHACO Left 04/30/2016   Procedure: CATARACT EXTRACTION PHACO AND INTRAOCULAR LENS PLACEMENT LEFT EYE CDE=9.83;  Surgeon: Tonny Branch, MD;  Location: [facility];  Service: Ophthalmology;  Laterality: Left;  left   CHOLECYSTECTOMY N/A 02/23/2013   Procedure: LAPAROSCOPIC CHOLECYSTECTOMY;  Surgeon: Jamesetta So, MD;  Location: [facility];  Service: General;  Laterality: N/A;   ERCP N/A 02/24/2013   Procedure: ENDOSCOPIC RETROGRADE CHOLANGIOPANCREATOGRAPHY;  Surgeon: Daneil Dolin, MD;  Location: [facility];  Service: Endoscopy;  Laterality: N/A;   ROTATOR CUFF REPAIR Right    Family History  Problem Relation Age of Onset   Hypertension Mother    Hypertension Father    Diabetes Sister    Diabetes Sister    Heart disease Brother    Emphysema Brother    Colon cancer Neg Hx    Social History   Socioeconomic History   Marital status: Married    Spouse name: [name]   Number of children: 6   Years of education: 11th   Highest education level: 11th grade  Occupational History   Occupation: Retired    Comment: security - [employer]   Tobacco Use   Smoking status: Never   Smokeless tobacco: Never  Scientific laboratory technician Use: Never used  Substance and Sexual Activity   Alcohol use: No   Drug use: No   Sexual activity: Not Currently  Other Topics Concern   Not on file  Social History Narrative   Patient lives at home with wife, [name] - Grandson lives with them right  now and son lives across the street.   Caffeine Use: 1 cup of coffee and 2 can pepsi   Social Determinants of Health   Financial Resource Strain: Low Risk    Difficulty of Paying Living Expenses: Not very hard  Food Insecurity: No Food Insecurity   Worried About Charity fundraiser in the  Last Year: Never true   Ran Out of Food in the Last Year: Never true  Transportation Needs: No Transportation Needs   Lack of Transportation (Medical): No   Lack of Transportation (Non-Medical): No  Physical Activity: Insufficiently Active   Days of Exercise per Week: 7 days   Minutes of Exercise per Session: 20 min  Stress: No Stress Concern Present   Feeling of Stress : Not at all  Social Connections: Moderately Integrated   Frequency of Communication with Friends and Family: More than three times a week   Frequency of Social Gatherings with Friends and Family: More than three times a week   Attends Religious Services: 1 to 4 times per year   Active Member of [employer] or Organizations: No   Attends Archivist Meetings: Never   Marital Status: Married  Tobacco Counseling Counseling given: Not Answered   Clinical Intake:  Pre-visit preparation completed: Yes  Pain : No/denies pain Pain Score: 0-No pain     BMI - recorded: 24.82 Nutritional Status: BMI of 19-24  Normal Nutritional Risks: None Diabetes: No  How often do you need to have someone help you when you read instructions, pamphlets, or other written materials from your doctor or pharmacy?: 3 - Sometimes  Nutrition Risk Assessment:  Has the patient had any N/V/D within the last 2 months?  No  Does the patient have any non-healing wounds?  No  Has the patient had any unintentional weight loss or weight gain?  No   Diabetes:  Is the patient diabetic?  Yes  If diabetic, was a CBG obtained today?  No  Did the patient bring in their glucometer from home?  No  How often do you monitor your CBG's? Used to check it daily - hasn't been checking lately.   Financial Strains and Diabetes Management:  Are you having any financial strains with the device, your supplies or your medication? No .  Does the patient want to be seen by Chronic Care Management for management of their diabetes?  No  Would the patient  like to be referred to a Nutritionist or for Diabetic Management?  No   Diabetic Exams:  Diabetic Eye Exam: Completed 07/14/2019. Overdue for diabetic eye exam. Pt has been advised about the importance in completing this exam. Wife will make appt with Dr Katy Fitch soon. Diabetic Foot Exam: Completed 01/03/2018. Pt has been advised about the importance in completing this exam. Pt is scheduled for diabetic foot exam on 01/12/21.    Interpreter Needed?: No  Information entered by :: Zenobia Kuennen, LPN   Activities of Daily Living In your present state of health, do you have any difficulty performing the following activities: 12/29/2020  Hearing? N  Vision? N  Difficulty concentrating or making decisions? Y  Walking or climbing stairs? N  Dressing or bathing? N  Doing errands, shopping? N  Preparing Food and eating ? N  Using the Toilet? N  In the past six months, have you accidently leaked urine? N  Do you have problems with loss of bowel control? N  Managing your Medications? Y  Managing your Finances? Y  Housekeeping or managing your Housekeeping? Y  Some recent data might be hidden    Patient Care Team: Claretta Fraise, MD as PCP - General (Family Medicine) Shea Evans, [name], LCSW as Social Worker (Licensed Clinical Social Worker) Cori Razor, Delice Bison, RN as Case Manager [place], Rose Fillers, MD as Consulting Physician (Pulmonary Disease) Katy Fitch, Darlina Guys, MD as Consulting Physician (Ophthalmology)  Indicate any recent [place] you may have received from other than [facility] providers in the past year (date may be approximate).     Assessment:   This is a routine wellness examination for [place].  Hearing/Vision screen Hearing Screening - Comments:: Denies hearing difficulties  Vision Screening - Comments:: Wears eyeglasses prn - behind on annual eye exams with Dr Katy Fitch - Wife will make him an appt soon  Dietary issues and exercise activities discussed: Current Exercise Habits: Home  exercise routine, Type of exercise: walking, Time (Minutes): 20, Frequency (Times/Week): 7, Weekly Exercise (Minutes/Week): 140, Intensity: Mild, Exercise limited by: psychological condition(s);respiratory conditions(s)   Goals Addressed               This Visit's Progress     "We want to manage his dementia and keep  it from getting worse" (pt-stated)   On track     Current Barriers:  Knowledge Deficits related to dementia disease process.  Nurse Case Manager Clinical Goal(s):  Over the next 30 days, patient will verbalize understanding of plan for dementia Over the next 60 days, patient and wife will work with PCP regarding the effectiveness of Namenda and [name].  Interventions:  Evaluation of current treatment plan related to dementia and patient's adherence to plan as established by provider. Advised patient to continue to stay physically and socially active.  Physical activity and socialization are very important and can help decrease anxiety and depression and can have a positive impact on cognitive function.  Wife reports that he walks daily to visit his son and his brother who both live close by.  Patient enjoys riding in the car but is not able to drive at this point. Encouraged a healthy diet with lean proteins, low glycemic fruits, vegetables, and whole grains.  Wife reports that he is not eating foods that he normally liked. He wants to eat out somewhere like [employer] everyday.  Provided education to patient re: dementia vs Alzhemier's  Patient Self Care Activities:  Currently UNABLE TO independently drive Performs ADL's independently  Has help from wife  Please see past updates related to this goal by clicking on the "Past Updates" button in the selected goal          Exercise 150 min/wk Moderate Activity   Not on track     Prevent falls   On track      Depression Screen [facility] 2/9 Scores 12/29/2020 11/15/2020 10/19/2020 07/21/2020 04/12/2020 01/05/2020 12/29/2019  PHQ  - 2 Score 0 0 0 0 0 0 0  PHQ- 9 Score - - - - - - -  Exception Documentation - - - - - - -    Fall Risk Fall Risk  12/29/2020 11/15/2020 10/19/2020 07/21/2020 04/12/2020  Falls in the past year? 0 0 0 0 0  Number falls in past yr: 0 - - - -  Injury with Fall? 0 - - - -  Risk Factor Category  - - - - -  Risk for fall due to : Mental status change - - - -  Follow up Falls prevention discussed - - Falls evaluation completed Falls evaluation completed    [place]:  Any stairs in or around the home? No  If so, are there any without handrails? No  Home free of loose throw rugs in walkways, pet beds, electrical cords, etc? Yes  Adequate lighting in your home to reduce risk of falls? Yes   ASSISTIVE DEVICES UTILIZED TO PREVENT FALLS:  Life alert? No  Use of a cane, walker or w/c? No  Grab bars in the bathroom? Yes  Shower chair or bench in shower? No  Elevated toilet seat or a handicapped toilet? Yes   TIMED UP AND GO:  Was the test performed? No .   Cognitive Function: Cognitive status assessed by direct observation. Patient has current diagnosis of cognitive impairment. Patient is unable to complete screening 6CIT or MMSE.   MMSE - Mini Mental State Exam 10/10/2017 09/06/2016  Orientation to time 5 1  Orientation to Place 4 4  Registration 3 3  Attention/ Calculation 5 5  Recall 2 1  Language- name 2 objects 2 2  Language- repeat 1 1  Language- follow 3 step command 1 3  Language- read & follow direction 1 1  Write a sentence 1 1  Copy design 0 0  Total score 25 22     6CIT Screen 12/29/2019 12/26/2018  What Year? 0 points 4 points  What month? 0 points 3 points  What time? 0 points 3 points  Count back from 20 2 points 4 points  Months in reverse 0 points 4 points  Repeat phrase 10 points 6 points  Total Score 12 24    Immunizations Immunization History  Administered Date(s) Administered   Fluad Quad(high Dose 65+) 04/16/2019,  04/12/2020   Influenza Split 03/26/2012   Influenza, High Dose Seasonal PF 03/28/2016, 04/08/2017, 04/09/2018   Influenza,inj,Quad PF,6+ Mos 04/21/2014   Influenza-Unspecified 05/10/2015   Moderna Sars-Covid-2 Vaccination 09/17/2019, 10/17/2019   Pneumococcal Conjugate-13 08/18/2015   Pneumococcal Polysaccharide-23 06/08/2014   Td 07/04/2016    TDAP status: Up to date  Flu Vaccine status: Up to date  Pneumococcal vaccine status: Up to date  Covid-19 vaccine status: Completed vaccines  Qualifies for Shingles Vaccine? Yes   Zostavax completed No   Shingrix Completed?: No.    Education has been provided regarding the importance of this vaccine. Patient has been advised to call insurance company to determine out of pocket expense if they have not yet received this vaccine. Advised may also receive vaccine at local pharmacy or Health Dept. Verbalized acceptance and understanding.  Screening Tests Health Maintenance  Topic Date Due   Zoster Vaccines- Shingrix (1 of 2) Never done   OPHTHALMOLOGY EXAM  02/02/2017   FOOT EXAM  01/04/2019   COVID-19 Vaccine (3 - Booster for Moderna series) 03/18/2020   INFLUENZA VACCINE  02/06/2021   HEMOGLOBIN A1C  04/20/2021   TETANUS/TDAP  07/04/2026   PNA vac Low Risk Adult  Completed   HPV VACCINES  Aged Out    Health Maintenance  Health Maintenance Due  Topic Date Due   Zoster Vaccines- Shingrix (1 of 2) Never done   OPHTHALMOLOGY EXAM  02/02/2017   FOOT EXAM  01/04/2019   COVID-19 Vaccine (3 - Booster for Moderna series) 03/18/2020    Colorectal cancer screening: No longer required.   Lung Cancer Screening: (Low Dose CT Chest recommended if Age 16-80 years, 30 pack-year currently smoking OR have quit w/in 15years.) does not qualify.   Additional Screening:  Hepatitis C Screening: does not qualify  Vision Screening: Recommended annual ophthalmology exams for early detection of glaucoma and other disorders of the eye. Is the patient  up to date with their annual eye exam?  No  Who is the provider or what is the name of the office in which the patient attends annual eye exams? [facility] If pt is not established with a provider, would they like to be referred to a provider to establish care? No .   Dental Screening: Recommended annual dental exams for proper oral hygiene  Community Resource Referral / Chronic Care Management: CRR required this visit?  No   CCM required this visit?  No      Plan:     I have personally reviewed and noted the following in the patient's chart:   Medical and social history Use of alcohol, tobacco or illicit drugs  Current medications and supplements including opioid prescriptions. Patient is not currently taking opioid prescriptions. Functional ability and status Nutritional status Physical activity Advanced directives List of other physicians Hospitalizations, surgeries, and ER visits in previous 12 months Vitals Screenings to include cognitive, depression, and falls Referrals and appointments  In addition, I have reviewed and  discussed with patient certain preventive protocols, quality metrics, and best practice recommendations. A written personalized care plan for preventive services as well as general preventive health recommendations were provided to patient.     Sandrea Hammond, LPN   [id number]   Nurse Notes: wife says increased dose of Namenda seems to be helping a lot.

## 2020-12-29 NOTE — Patient Instructions (Signed)
[name] , Thank you for taking time to come for your Medicare Wellness Visit. I appreciate your ongoing commitment to your health goals. Please review the following plan we discussed and let me know if I can assist you in the future.   Screening recommendations/referrals: Colonoscopy: No longer required  Recommended yearly ophthalmology/optometry visit for glaucoma screening and checkup Recommended yearly dental visit for hygiene and checkup  Vaccinations: Influenza vaccine: Done 04/12/2020 - Repeat annually Pneumococcal vaccine: Done 12/1 & 08/18/2015 Tdap vaccine: Done 07/04/2016 Shingles vaccine: Due Shingrix discussed. Please contact your pharmacy for coverage information.    Covid-19: Done 09/17/19 & 10/17/19 - due for booster  Advanced directives: Advance directive discussed with you today. Even though you declined this today, please call our office should you change your mind, and we can give you the proper paperwork for you to fill out.  Conditions/risks identified: Aim for 30 minutes of exercise or brisk walking each day, drink 6-8 glasses of water and eat lots of fruits and vegetables.  Next appointment: Follow up in one year for your annual wellness visit.   Preventive Care 23 Years and Older, Male  Preventive care refers to lifestyle choices and visits with your health care provider that can promote health and wellness. What does preventive care include? A yearly physical exam. This is also called an annual well check. Dental exams once or twice a year. Routine eye exams. Ask your health care provider how often you should have your eyes checked. Personal lifestyle choices, including: Daily care of your teeth and gums. Regular physical activity. Eating a healthy diet. Avoiding tobacco and drug use. Limiting alcohol use. Practicing safe sex. Taking low doses of aspirin every day. Taking vitamin and mineral supplements as recommended by your health care provider. What  happens during an annual well check? The services and screenings done by your health care provider during your annual well check will depend on your age, overall health, lifestyle risk factors, and family history of disease. Counseling  Your health care provider may ask you questions about your: Alcohol use. Tobacco use. Drug use. Emotional well-being. Home and relationship well-being. Sexual activity. Eating habits. History of falls. Memory and ability to understand (cognition). Work and work Statistician. Screening  You may have the following tests or measurements: Height, weight, and BMI. Blood pressure. Lipid and cholesterol levels. These may be checked every 5 years, or more frequently if you are over 50 years old. Skin check. Lung cancer screening. You may have this screening every year starting at age 10 if you have a 30-pack-year history of smoking and currently smoke or have quit within the past 15 years. Fecal occult blood test (FOBT) of the stool. You may have this test every year starting at age 17. Flexible sigmoidoscopy or colonoscopy. You may have a sigmoidoscopy every 5 years or a colonoscopy every 10 years starting at age 57. Prostate cancer screening. Recommendations will vary depending on your family history and other risks. Hepatitis C blood test. Hepatitis B blood test. Sexually transmitted disease (STD) testing. Diabetes screening. This is done by checking your blood sugar (glucose) after you have not eaten for a while (fasting). You may have this done every 1-3 years. Abdominal aortic aneurysm (AAA) screening. You may need this if you are a current or former smoker. Osteoporosis. You may be screened starting at age 72 if you are at high risk. Talk with your health care provider about your test results, treatment options, and if necessary, the  need for more tests. Vaccines  Your health care provider may recommend certain vaccines, such as: Influenza vaccine. This  is recommended every year. Tetanus, diphtheria, and acellular pertussis (Tdap, Td) vaccine. You may need a Td booster every 10 years. Zoster vaccine. You may need this after age 58. Pneumococcal 13-valent conjugate (PCV13) vaccine. One dose is recommended after age 74. Pneumococcal polysaccharide (PPSV23) vaccine. One dose is recommended after age 25. Talk to your health care provider about which screenings and vaccines you need and how often you need them. This information is not intended to replace advice given to you by your health care provider. Make sure you discuss any questions you have with your health care provider. Document Released: 07/22/2015 Document Revised: 03/14/2016 Document Reviewed: 04/26/2015 Elsevier Interactive Patient Education  2017 [place] Prevention in the Home Falls can cause injuries. They can happen to people of all ages. There are many things you can do to make your home safe and to help prevent falls. What can I do on the outside of my home? Regularly fix the edges of walkways and driveways and fix any cracks. Remove anything that might make you trip as you walk through a door, such as a raised step or threshold. Trim any bushes or trees on the path to your home. Use bright outdoor lighting. Clear any walking paths of anything that might make someone trip, such as rocks or tools. Regularly check to see if handrails are loose or broken. Make sure that both sides of any steps have handrails. Any raised decks and porches should have guardrails on the edges. Have any leaves, snow, or ice cleared regularly. Use sand or salt on walking paths during winter. Clean up any spills in your garage right away. This includes oil or grease spills. What can I do in the bathroom? Use night lights. Install grab bars by the toilet and in the tub and shower. Do not use towel bars as grab bars. Use non-skid mats or decals in the tub or shower. If you need to sit down  in the shower, use a plastic, non-slip stool. Keep the floor dry. Clean up any water that spills on the floor as soon as it happens. Remove soap buildup in the tub or shower regularly. Attach bath mats securely with double-sided non-slip rug tape. Do not have throw rugs and other things on the floor that can make you trip. What can I do in the bedroom? Use night lights. Make sure that you have a light by your bed that is easy to reach. Do not use any sheets or blankets that are too big for your bed. They should not hang down onto the floor. Have a firm chair that has side arms. You can use this for support while you get dressed. Do not have throw rugs and other things on the floor that can make you trip. What can I do in the kitchen? Clean up any spills right away. Avoid walking on wet floors. Keep items that you use a lot in easy-to-reach places. If you need to reach something above you, use a strong step stool that has a grab bar. Keep electrical cords out of the way. Do not use floor polish or wax that makes floors slippery. If you must use wax, use non-skid floor wax. Do not have throw rugs and other things on the floor that can make you trip. What can I do with my stairs? Do not leave any items on the  stairs. Make sure that there are handrails on both sides of the stairs and use them. Fix handrails that are broken or loose. Make sure that handrails are as long as the stairways. Check any carpeting to make sure that it is firmly attached to the stairs. Fix any carpet that is loose or worn. Avoid having throw rugs at the top or bottom of the stairs. If you do have throw rugs, attach them to the floor with carpet tape. Make sure that you have a light switch at the top of the stairs and the bottom of the stairs. If you do not have them, ask someone to add them for you. What else can I do to help prevent falls? Wear shoes that: Do not have high heels. Have rubber bottoms. Are comfortable  and fit you well. Are closed at the toe. Do not wear sandals. If you use a stepladder: Make sure that it is fully opened. Do not climb a closed stepladder. Make sure that both sides of the stepladder are locked into place. Ask someone to hold it for you, if possible. Clearly mark and make sure that you can see: Any grab bars or handrails. First and last steps. Where the edge of each step is. Use tools that help you move around (mobility aids) if they are needed. These include: Canes. Walkers. Scooters. Crutches. Turn on the lights when you go into a dark area. Replace any light bulbs as soon as they burn out. Set up your furniture so you have a clear path. Avoid moving your furniture around. If any of your floors are uneven, fix them. If there are any pets around you, be aware of where they are. Review your medicines with your doctor. Some medicines can make you feel dizzy. This can increase your chance of falling. Ask your doctor what other things that you can do to help prevent falls. This information is not intended to replace advice given to you by your health care provider. Make sure you discuss any questions you have with your health care provider. Document Released: 04/21/2009 Document Revised: 12/01/2015 Document Reviewed: 07/30/2014 Elsevier Interactive Patient Education  2017 [employer].

## 2021-01-12 ENCOUNTER — Ambulatory Visit (INDEPENDENT_AMBULATORY_CARE_PROVIDER_SITE_OTHER): Payer: Medicare HMO | Admitting: Family Medicine

## 2021-01-12 ENCOUNTER — Encounter: Payer: Self-pay | Admitting: Family Medicine

## 2021-01-12 ENCOUNTER — Other Ambulatory Visit: Payer: Self-pay

## 2021-01-12 VITALS — BP 116/64 | HR 74 | Temp 97.6°F | Ht 72.0 in | Wt 179.4 lb

## 2021-01-12 DIAGNOSIS — E785 Hyperlipidemia, unspecified: Secondary | ICD-10-CM

## 2021-01-12 DIAGNOSIS — R809 Proteinuria, unspecified: Secondary | ICD-10-CM | POA: Diagnosis not present

## 2021-01-12 DIAGNOSIS — I152 Hypertension secondary to endocrine disorders: Secondary | ICD-10-CM | POA: Diagnosis not present

## 2021-01-12 DIAGNOSIS — F0150 Vascular dementia without behavioral disturbance: Secondary | ICD-10-CM

## 2021-01-12 DIAGNOSIS — E1129 Type 2 diabetes mellitus with other diabetic kidney complication: Secondary | ICD-10-CM

## 2021-01-12 DIAGNOSIS — E1169 Type 2 diabetes mellitus with other specified complication: Secondary | ICD-10-CM

## 2021-01-12 DIAGNOSIS — E1159 Type 2 diabetes mellitus with other circulatory complications: Secondary | ICD-10-CM

## 2021-01-12 DIAGNOSIS — Z794 Long term (current) use of insulin: Secondary | ICD-10-CM | POA: Diagnosis not present

## 2021-01-12 LAB — CBC WITH DIFFERENTIAL/PLATELET
Basophils Absolute: 0.1 10*3/uL (ref 0.0–0.2)
Basos: 1 %
EOS (ABSOLUTE): 0.3 10*3/uL (ref 0.0–0.4)
Eos: 4 %
Hematocrit: 37.9 % (ref 37.5–51.0)
Hemoglobin: 13.1 g/dL (ref 13.0–17.7)
Immature Grans (Abs): 0 10*3/uL (ref 0.0–0.1)
Immature Granulocytes: 0 %
Lymphocytes Absolute: 1.8 10*3/uL (ref 0.7–3.1)
Lymphs: 23 %
MCH: 30 pg (ref 26.6–33.0)
MCHC: 34.6 g/dL (ref 31.5–35.7)
MCV: 87 fL (ref 79–97)
Monocytes Absolute: 0.5 10*3/uL (ref 0.1–0.9)
Monocytes: 7 %
Neutrophils Absolute: 5.1 10*3/uL (ref 1.4–7.0)
Neutrophils: 65 %
Platelets: 169 10*3/uL (ref 150–450)
RBC: 4.36 x10E6/uL (ref 4.14–5.80)
RDW: 11.8 % (ref 11.6–15.4)
WBC: 7.7 10*3/uL (ref 3.4–10.8)

## 2021-01-12 LAB — CMP14+EGFR
ALT: 26 IU/L (ref 0–44)
AST: 16 IU/L (ref 0–40)
Albumin/Globulin Ratio: 1.5 (ref 1.2–2.2)
Albumin: 4.3 g/dL (ref 3.6–4.6)
Alkaline Phosphatase: 101 IU/L (ref 44–121)
BUN/Creatinine Ratio: 14 (ref 10–24)
BUN: 18 mg/dL (ref 8–27)
Bilirubin Total: 0.7 mg/dL (ref 0.0–1.2)
CO2: 22 mmol/L (ref 20–29)
Calcium: 9 mg/dL (ref 8.6–10.2)
Chloride: 108 mmol/L — ABNORMAL HIGH (ref 96–106)
Creatinine, Ser: 1.28 mg/dL — ABNORMAL HIGH (ref 0.76–1.27)
Globulin, Total: 2.8 g/dL (ref 1.5–4.5)
Glucose: 160 mg/dL — ABNORMAL HIGH (ref 65–99)
Potassium: 4.2 mmol/L (ref 3.5–5.2)
Sodium: 144 mmol/L (ref 134–144)
Total Protein: 7.1 g/dL (ref 6.0–8.5)
eGFR: 56 mL/min/{1.73_m2} — ABNORMAL LOW (ref >59)

## 2021-01-12 LAB — LIPID PANEL
Chol/HDL Ratio: 2.2 ratio (ref 0.0–5.0)
Cholesterol, Total: 90 mg/dL — ABNORMAL LOW (ref 100–199)
HDL: 41 mg/dL (ref >39)
LDL Chol Calc (NIH): 32 mg/dL (ref 0–99)
Triglycerides: 83 mg/dL (ref 0–149)
VLDL Cholesterol Cal: 17 mg/dL (ref 5–40)

## 2021-01-12 LAB — BAYER DCA HB A1C WAIVED: HB A1C (BAYER DCA - WAIVED): 6.5 % (ref <7.0)

## 2021-01-12 MED ORDER — MEMANTINE HCL 10 MG PO TABS: 10.0000 mg | ORAL_TABLET | Freq: Two times a day (BID) | ORAL | 3 refills | Status: DC

## 2021-01-12 MED ORDER — ATORVASTATIN CALCIUM 40 MG PO TABS: 40.0000 mg | ORAL_TABLET | Freq: Every day | ORAL | 3 refills | Status: DC

## 2021-01-12 MED ORDER — DONEPEZIL HCL 23 MG PO TABS: 23.0000 mg | ORAL_TABLET | Freq: Every day | ORAL | 1 refills | Status: DC

## 2021-01-12 MED ORDER — LOSARTAN POTASSIUM 50 MG PO TABS: 50.0000 mg | ORAL_TABLET | Freq: Every day | ORAL | 3 refills | Status: DC

## 2021-01-12 NOTE — Progress Notes (Signed)
Subjective:  Patient ID: [name], male    DOB: Apr 03, 1936  Age: 85 y.o. MRN: [id number]  CC: Medical Management of Chronic Issues   HPI [name] presents forFollow-up of diabetes. Patient does not check blood sugar at home.  Patient denies symptoms such as polyuria, polydipsia, excessive hunger, nausea No significant hypoglycemic spells noted. Medications reviewed. Pt reports taking them regularly without complication/adverse reaction being reported today.    Reports near death experience 4-5 years ago when his gallbladder burst. Wife reports some dementia since.   History [name] has a past medical history of Anxiety, Asthma, Choledocholithiasis (02/24/2013), COPD (chronic obstructive pulmonary disease) ([place]), CVA (cerebral vascular accident) ([place]) (2013), Dementia ([place]), Diabetes mellitus, TIA (transient ischemic attack), Hyperlipidemia, and Hypertension.   He has a past surgical history that includes Rotator cuff repair (Right); Cholecystectomy (N/A, 02/23/2013); ERCP (N/A, 02/24/2013); and Cataract extraction w/PHACO (Left, 04/30/2016).   His family history includes Diabetes in his sister and sister; Emphysema in his brother; Heart disease in his brother; Hypertension in his father and mother.He reports that he has never smoked. He has never used smokeless tobacco. He reports that he does not drink alcohol and does not use drugs.  Current Outpatient Medications on File Prior to Visit  Medication Sig Dispense Refill   Accu-Chek Softclix Lancets lancets Check glucose twice a day Dx E11.9 200 each 3   albuterol (VENTOLIN HFA) 108 (90 Base) MCG/ACT inhaler Inhale 2 puffs into the lungs every 4 (four) hours as needed for wheezing or shortness of breath. 18 g 5   Alcohol Swabs (B-D SINGLE USE SWABS REGULAR) PADS Check glucose twice a day Dx E11.9 200 each 3   aspirin EC 81 MG tablet Take 162 mg by mouth daily.      atorvastatin (LIPITOR) 40 MG tablet Take 1 tablet (40 mg total) by  mouth daily. 90 tablet 0   Blood Glucose Calibration (ACCU-CHEK AVIVA) SOLN Use with glucometer Dx E11.9 3 each 0   Blood Glucose Monitoring Suppl (ACCU-CHEK GUIDE) w/Device KIT Check glucose twice a day Dx E11.9 1 kit 0   diclofenac Sodium (VOLTAREN) 1 % GEL Apply 2 g topically 4 (four) times daily. 350 g 1   fluticasone furoate-vilanterol (BREO ELLIPTA) 100-25 MCG/INH AEPB Inhale 1 puff into the lungs daily. 30 each 5   glucose blood (ACCU-CHEK GUIDE) test strip Check glucose twice a day Dx E11.9 200 each 3   Insulin Pen Needle (PEN NEEDLES) 32G X 4 MM MISC 1 each by Does not apply route 2 (two) times daily. 90 each 3   ipratropium-albuterol (DUONEB) 0.5-2.5 (3) MG/3ML SOLN Take 3 mLs by nebulization every 4 (four) hours as needed. 360 mL 0   losartan (COZAAR) 50 MG tablet Take 1 tablet (50 mg total) by mouth daily. 90 tablet 1   memantine (NAMENDA) 10 MG tablet Take 1 tablet (10 mg total) by mouth 2 (two) times daily. 180 tablet 0   sildenafil (REVATIO) 20 MG tablet Take 2-5 pills at once, orally, with each sexual encounter 50 tablet 5   TRESIBA FLEXTOUCH 100 UNIT/ML FlexTouch Pen Inject 0.2 mLs (20 Units total) into the skin daily at 10 pm. 5 pen 3   donepezil ([name]) 23 MG TABS tablet Take 1 tablet (23 mg total) by mouth at bedtime. 90 tablet 1   No current facility-administered medications on file prior to visit.    ROS Review of Systems  Constitutional:  Negative for fever.  Respiratory:  Negative for  shortness of breath.   Cardiovascular:  Negative for chest pain.  Genitourinary:  Negative for difficulty urinating, frequency and testicular pain.  Musculoskeletal:  Negative for arthralgias.  Skin:  Negative for rash.   Objective:  BP 116/64   Pulse 74   Temp 97.6 F (36.4 C)   Ht 6' (1.829 m)   Wt 179 lb 6.4 oz (81.4 kg)   SpO2 100%   BMI 24.33 kg/m   BP Readings from Last 3 Encounters:  01/12/21 116/64  12/24/20 130/71  11/15/20 131/69    Wt Readings from Last 3  Encounters:  01/12/21 179 lb 6.4 oz (81.4 kg)  12/29/20 183 lb (83 kg)  12/24/20 183 lb 13.8 oz (83.4 kg)     Physical Exam Constitutional:      General: He is not in acute distress.    Appearance: He is well-developed.  HENT:     Head: Normocephalic and atraumatic.     Right Ear: External ear normal.     Left Ear: External ear normal.     Nose: Nose normal.  Eyes:     Conjunctiva/sclera: Conjunctivae normal.     Pupils: Pupils are equal, round, and reactive to light.  Cardiovascular:     Rate and Rhythm: Normal rate and regular rhythm.     Heart sounds: Normal heart sounds. No murmur heard. Pulmonary:     Effort: Pulmonary effort is normal. No respiratory distress.     Breath sounds: Normal breath sounds. No wheezing or rales.  Abdominal:     Palpations: Abdomen is soft.     Tenderness: There is no abdominal tenderness.  Musculoskeletal:        General: Normal range of motion.     Cervical back: Normal range of motion and neck supple.  Skin:    General: Skin is warm and dry.  Neurological:     Mental Status: He is alert and oriented to person, place, and time.     Deep Tendon Reflexes: Reflexes are normal and symmetric.  Psychiatric:        Mood and Affect: Mood normal.        Behavior: Behavior normal.      Assessment & Plan:   [name] was seen today for medical management of chronic issues.  Diagnoses and all orders for this visit:  Type 2 diabetes mellitus with other specified complication, with long-term current use of insulin ([place]) -     Bayer DCA Hb A1c Waived -     CBC with Differential/Platelet  Hyperlipidemia associated with type 2 diabetes mellitus ([place]) -     Lipid panel  Hypertension associated with type 2 diabetes mellitus ([place]) -     CMP14+EGFR  Type 2 diabetes mellitus with microalbuminuria, with long-term current use of insulin ([place])  Vascular dementia without behavioral disturbance ([place])     I am having [name] maintain his  aspirin EC, Pen Needles, diclofenac Sodium, Tyler Aas FlexTouch, sildenafil, losartan, albuterol, Breo Ellipta, ipratropium-albuterol, atorvastatin, memantine, Accu-Chek Aviva, Accu-Chek Softclix Lancets, B-D SINGLE USE SWABS REGULAR, Accu-Chek Guide, Accu-Chek Guide, and donepezil.  No orders of the defined types were placed in this encounter.    Follow-up: Return in about 3 months (around 04/14/2021).  Claretta Fraise, M.D.

## 2021-01-16 NOTE — Progress Notes (Signed)
Hello Derico,  Your lab result is normal and/or stable.Some minor variations that are not significant are commonly marked abnormal, but do not represent any medical problem for you.  Best regards, Khari Lett, M.D.

## 2021-01-25 ENCOUNTER — Telehealth: Payer: Medicare HMO

## 2021-03-06 ENCOUNTER — Ambulatory Visit (INDEPENDENT_AMBULATORY_CARE_PROVIDER_SITE_OTHER): Payer: Medicare HMO | Admitting: Licensed Clinical Social Worker

## 2021-03-06 DIAGNOSIS — Z794 Long term (current) use of insulin: Secondary | ICD-10-CM

## 2021-03-06 DIAGNOSIS — E785 Hyperlipidemia, unspecified: Secondary | ICD-10-CM

## 2021-03-06 DIAGNOSIS — Z8673 Personal history of transient ischemic attack (TIA), and cerebral infarction without residual deficits: Secondary | ICD-10-CM

## 2021-03-06 DIAGNOSIS — I152 Hypertension secondary to endocrine disorders: Secondary | ICD-10-CM

## 2021-03-06 DIAGNOSIS — E1159 Type 2 diabetes mellitus with other circulatory complications: Secondary | ICD-10-CM | POA: Diagnosis not present

## 2021-03-06 DIAGNOSIS — J45909 Unspecified asthma, uncomplicated: Secondary | ICD-10-CM | POA: Diagnosis not present

## 2021-03-06 DIAGNOSIS — J449 Chronic obstructive pulmonary disease, unspecified: Secondary | ICD-10-CM | POA: Diagnosis not present

## 2021-03-06 DIAGNOSIS — F0150 Vascular dementia without behavioral disturbance: Secondary | ICD-10-CM | POA: Diagnosis not present

## 2021-03-06 DIAGNOSIS — E1169 Type 2 diabetes mellitus with other specified complication: Secondary | ICD-10-CM | POA: Diagnosis not present

## 2021-03-06 NOTE — Patient Instructions (Signed)
Visit Information  PATIENT GOALS:  Goals Addressed             This Visit's Progress    Manage My Emotions; Manage Mood issues; Complete ADLs as needed       Timeframe:  Short-Term Goal Priority:  Medium Progress: On Track Start Date:             03/06/21                Expected End Date:           06/05/21            Follow Up Date 04/18/21   Manage Emotions; Manage mood issues; Complete ADLs as needed    Why is this important?   When you are stressed, down or upset, your body reacts too.  For example, your blood pressure may get higher; you may have a headache or stomachache.  When your emotions get the best of you, your body's ability to fight off cold and flu gets weak.  These steps will help you manage your emotions.     Patient Strengths/Coping Skills: Has strong support from spouse, Bakr Goldie Completes most ADLs on his own Allows time to rest and relax  Patient Deficits:  Memory issues Some walking challenges Some mood issues  Patient Goals: In next 30 days, patient will:  Attend all scheduled medical appointments Communicate regularly with spouse, [name], to discuss client needs Take medications as prescribed Allow time to rest and practice self care activities -  Follow Up Plan: LCSW to call client or spouse of client on 04/18/21 to assess client needs          [name].Antwoine Zorn MSW, LCSW Licensed Clinical Social Worker [facility] Care Management [phone]

## 2021-03-06 NOTE — Chronic Care Management (AMB) (Signed)
Chronic Care Management    Clinical Social Work Note  03/06/2021 Name: [name] MRN: [id number] DOB: Sep 21, 1935  [name] is a 85 y.o. year old male who is a primary care patient of [name], Kenneth Gash, MD. The CCM team was consulted to assist the patient with chronic disease management and/or care coordination needs related to: [name] .   Engaged with patient/spouse of patient, [name], by telephone for follow up visit in response to provider referral for social work chronic care management and care coordination services.   Consent to Services:  The patient was given information about Chronic Care Management services, agreed to services, and gave verbal consent prior to initiation of services.  Please see initial visit note for detailed documentation.   Patient agreed to services and consent obtained.   Assessment: Review of patient past medical history, allergies, medications, and health status, including review of relevant consultants reports was performed today as part of a comprehensive evaluation and provision of chronic care management and care coordination services.     SDOH (Social Determinants of Health) assessments and interventions performed:  SDOH Interventions    Flowsheet Row Most Recent Value  SDOH Interventions   Physical Activity Interventions Other (Comments)  [client uses a cane to help him walk]  Depression Interventions/Treatment  --  [informed [name], spouse of patient, about LCSW support and about RNCM support]        Advanced Directives Status: See [employer] application for related entries.  CCM Care Plan  Allergies  Allergen Reactions   Ace Inhibitors Swelling    Outpatient Encounter Medications as of 03/06/2021  Medication Sig   Accu-Chek Softclix Lancets lancets Check glucose twice a day Dx E11.9   albuterol (VENTOLIN HFA) 108 (90 Base) MCG/ACT inhaler Inhale 2 puffs into the lungs every 4 (four) hours as needed for wheezing or  shortness of breath.   Alcohol Swabs (B-D SINGLE USE SWABS REGULAR) PADS Check glucose twice a day Dx E11.9   aspirin EC 81 MG tablet Take 162 mg by mouth daily.    atorvastatin (LIPITOR) 40 MG tablet Take 1 tablet (40 mg total) by mouth daily.   Blood Glucose Calibration (ACCU-CHEK AVIVA) SOLN Use with glucometer Dx E11.9   Blood Glucose Monitoring Suppl (ACCU-CHEK GUIDE) w/Device KIT Check glucose twice a day Dx E11.9   diclofenac Sodium (VOLTAREN) 1 % GEL Apply 2 g topically 4 (four) times daily.   donepezil ([name]) 23 MG TABS tablet Take 1 tablet (23 mg total) by mouth at bedtime.   fluticasone furoate-vilanterol (BREO ELLIPTA) 100-25 MCG/INH AEPB Inhale 1 puff into the lungs daily.   glucose blood (ACCU-CHEK GUIDE) test strip Check glucose twice a day Dx E11.9   Insulin Pen Needle (PEN NEEDLES) 32G X 4 MM MISC 1 each by Does not apply route 2 (two) times daily.   ipratropium-albuterol (DUONEB) 0.5-2.5 (3) MG/3ML SOLN Take 3 mLs by nebulization every 4 (four) hours as needed.   losartan (COZAAR) 50 MG tablet Take 1 tablet (50 mg total) by mouth daily.   memantine (NAMENDA) 10 MG tablet Take 1 tablet (10 mg total) by mouth 2 (two) times daily.   sildenafil (REVATIO) 20 MG tablet Take 2-5 pills at once, orally, with each sexual encounter   TRESIBA FLEXTOUCH 100 UNIT/ML FlexTouch Pen Inject 0.2 mLs (20 Units total) into the skin daily at 10 pm.   No facility-administered encounter medications on file as of 03/06/2021.    Patient Active Problem List  Diagnosis Date Noted   Asthma    COPD (chronic obstructive pulmonary disease) ([place])    Hyperlipidemia associated with type 2 diabetes mellitus ([place]) 11/27/2018   Intrinsic asthma 03/31/2015   Dementia without behavioral disturbance ([facility])    Atelectasis of right lung 01/29/2014   Pleural effusion on right 11/24/2013   Late effect of cerebrovascular accident (CVA)    Diabetes mellitus ([place])    Hypertension associated with type 2 diabetes  mellitus ([place])    Hx-TIA (transient ischemic attack)     Conditions to be addressed/monitored: Monitor client management of emotions and of mood issues faced. Monitor client completion of ADLs  Care Plan : LCSW Care Plan  Updates made by Katha Cabal, LCSW since 03/06/2021 12:00 AM     Problem: Coping Skills (General Plan of Care)      Goal: Coping Skills Enhanced;complete ADLs as needed; manage mood issues faced   Start Date: 03/06/2021  Expected End Date: 06/05/2021  This Visit's Progress: On track  Priority: Medium  Note:   Current barriers:   Patient in need of assistance with connecting to community resources for possible help with completion of daily ADLs Patient is unable to independently navigate community resource options without care coordination support Mood issues Walking challenges (uses a cane to help him walk)  Clinical Goals:  Client or spouse of client will communicate with LCSW in next 30 days to discuss client completion of ADLs Client or spouse of client to communicate with LCSW in next 30 days to discuss client mood and management of mood issues  Clinical Interventions:  Collaboration with Kenneth Fraise, MD regarding development and update of comprehensive plan of care as evidenced by provider attestation and co-signature Assessment of needs, barriers of client Talked with [name], spouse of client, about client needs Talked with [name] about ambulation challenges of client Talked with [name] about appetite of client [name] said client has a reduced appetite. She thinks he has lost some weight Talked with [name] about transport needs of client. She said she transports client to and from client appointments Talked with [name] about vision of client (she said she thinks client has reduced vision) Talked with [name] about family support for client. She said that she has a little support occasionally from family to help with client needs Encouraged [name] to  call [facility] as needed to discuss nursing needs of client Talked with [name] about CCM program and support for client with CCM program  Patient Strengths/Coping Skills: Has strong support from spouse, [name] Completes most ADLs on his own Allows time to rest and relax  Patient Deficits:  Memory issues Some walking challenges Some mood issues  Patient Goals: In next 30 days, patient will:  Attend all scheduled medical appointments Communicate regularly with spouse, [name], to discuss client needs Take medications as prescribed Allow time to rest and practice self care activities -  Follow Up Plan: LCSW to call client or spouse of client on 04/18/21 to assess client needs   [name].Rashida Ladouceur MSW, LCSW Licensed Clinical Social Worker [facility] Care Management [phone]

## 2021-03-28 ENCOUNTER — Telehealth: Payer: Self-pay | Admitting: Family Medicine

## 2021-03-28 NOTE — Telephone Encounter (Signed)
Pts wife called to see if we could send order for pt to get a [place].  Please advise and call wife [name])

## 2021-03-28 NOTE — Telephone Encounter (Signed)
Pt. Will need to see Almyra Free. Elenor Legato is only usually approved for patients using multiple doses a day  of insulin. Almyra Free might be able to help. His onlyt other option would be to pay cash.

## 2021-03-28 NOTE — Telephone Encounter (Signed)
Patient's wife is interested in patient getting the Elenor Legato system to monitor his blood sugars and would like to know if you can send in a prescription.  He has his three month follow up scheduled with you on 04/18/21.

## 2021-03-30 NOTE — Telephone Encounter (Signed)
Returned call, no answer, left message to return call

## 2021-03-30 NOTE — Telephone Encounter (Signed)
Pleas elet pt. Know that we tried, but insurance turned him down.

## 2021-03-30 NOTE — Telephone Encounter (Signed)
Unfortunately, [employer] Rossetti is the only medicare plan that requires 3x daily insulin injections--he does not qualify :( He can talk to his insurance or maybe sign up for a different medicare advantage plan (I.e health team advantage, [place], etc) These are cover Elenor Legato free for once daily insulin patients  Another option I have done--if patient needs a "sliding scale" for potential meal time insulin--that will allow them to qualify   Cash price is $75 /month  Hope this helps!

## 2021-04-18 ENCOUNTER — Ambulatory Visit: Payer: Medicare HMO | Admitting: Family Medicine

## 2021-04-18 ENCOUNTER — Ambulatory Visit (INDEPENDENT_AMBULATORY_CARE_PROVIDER_SITE_OTHER): Payer: Medicare HMO | Admitting: Licensed Clinical Social Worker

## 2021-04-18 DIAGNOSIS — F0150 Vascular dementia without behavioral disturbance: Secondary | ICD-10-CM

## 2021-04-18 DIAGNOSIS — I152 Hypertension secondary to endocrine disorders: Secondary | ICD-10-CM

## 2021-04-18 DIAGNOSIS — J449 Chronic obstructive pulmonary disease, unspecified: Secondary | ICD-10-CM

## 2021-04-18 DIAGNOSIS — E785 Hyperlipidemia, unspecified: Secondary | ICD-10-CM

## 2021-04-18 DIAGNOSIS — Z8673 Personal history of transient ischemic attack (TIA), and cerebral infarction without residual deficits: Secondary | ICD-10-CM

## 2021-04-18 DIAGNOSIS — E1159 Type 2 diabetes mellitus with other circulatory complications: Secondary | ICD-10-CM

## 2021-04-18 DIAGNOSIS — J45909 Unspecified asthma, uncomplicated: Secondary | ICD-10-CM

## 2021-04-18 DIAGNOSIS — E1169 Type 2 diabetes mellitus with other specified complication: Secondary | ICD-10-CM

## 2021-04-18 NOTE — Chronic Care Management (AMB) (Signed)
Chronic Care Management    Clinical Social Work Note  04/18/2021 Name: [name] MRN: [id number] DOB: 11/24/1935  [name] is a 85 y.o. year old male who is a primary care patient of [name], Warren, MD. The CCM team was consulted to assist the patient with chronic disease management and/or care coordination needs related to: Community Resources .   Engaged with patient / spouse of patient, [name], by telephone for follow up visit in response to provider referral for social work chronic care management and care coordination services.   Consent to Services:  The patient was given information about Chronic Care Management services, agreed to services, and gave verbal consent prior to initiation of services.  Please see initial visit note for detailed documentation.   Patient agreed to services and consent obtained.   Assessment: Review of patient past medical history, allergies, medications, and health status, including review of relevant consultants reports was performed today as part of a comprehensive evaluation and provision of chronic care management and care coordination services.     SDOH (Social Determinants of Health) assessments and interventions performed:  SDOH Interventions    Flowsheet Row Most Recent Value  SDOH Interventions   Physical Activity Interventions Other (Comments)  [client has walking challenges. client uses a cane to help him walk]  Stress Interventions Other (Comment)  [client has stress related to managing medical needs]  Depression Interventions/Treatment  --  [informed [name], spouse of client, of LCSW support for client and of RNCM support for client]        Advanced Directives Status: See [employer] application for related entries.  CCM Care Plan  Allergies  Allergen Reactions   Ace Inhibitors Swelling    Outpatient Encounter Medications as of 04/18/2021  Medication Sig   Accu-Chek Softclix Lancets lancets Check glucose twice a day  Dx E11.9   albuterol (VENTOLIN HFA) 108 (90 Base) MCG/ACT inhaler Inhale 2 puffs into the lungs every 4 (four) hours as needed for wheezing or shortness of breath.   Alcohol Swabs (B-D SINGLE USE SWABS REGULAR) PADS Check glucose twice a day Dx E11.9   aspirin EC 81 MG tablet Take 162 mg by mouth daily.    atorvastatin (LIPITOR) 40 MG tablet Take 1 tablet (40 mg total) by mouth daily.   Blood Glucose Calibration (ACCU-CHEK AVIVA) SOLN Use with glucometer Dx E11.9   Blood Glucose Monitoring Suppl (ACCU-CHEK GUIDE) w/Device KIT Check glucose twice a day Dx E11.9   diclofenac Sodium (VOLTAREN) 1 % GEL Apply 2 g topically 4 (four) times daily.   donepezil ([name]) 23 MG TABS tablet Take 1 tablet (23 mg total) by mouth at bedtime.   fluticasone furoate-vilanterol (BREO ELLIPTA) 100-25 MCG/INH AEPB Inhale 1 puff into the lungs daily.   glucose blood (ACCU-CHEK GUIDE) test strip Check glucose twice a day Dx E11.9   Insulin Pen Needle (PEN NEEDLES) 32G X 4 MM MISC 1 each by Does not apply route 2 (two) times daily.   ipratropium-albuterol (DUONEB) 0.5-2.5 (3) MG/3ML SOLN Take 3 mLs by nebulization every 4 (four) hours as needed.   losartan (COZAAR) 50 MG tablet Take 1 tablet (50 mg total) by mouth daily.   memantine (NAMENDA) 10 MG tablet Take 1 tablet (10 mg total) by mouth 2 (two) times daily.   sildenafil (REVATIO) 20 MG tablet Take 2-5 pills at once, orally, with each sexual encounter   TRESIBA FLEXTOUCH 100 UNIT/ML FlexTouch Pen Inject 0.2 mLs (20 Units total) into the   skin daily at 10 pm.   No facility-administered encounter medications on file as of 04/18/2021.    Patient Active Problem List   Diagnosis Date Noted   Asthma    COPD (chronic obstructive pulmonary disease) ([place])    Hyperlipidemia associated with type 2 diabetes mellitus ([place]) 11/27/2018   Intrinsic asthma 03/31/2015   Dementia without behavioral disturbance (HCC)    Atelectasis of right lung 01/29/2014   Pleural effusion on  right 11/24/2013   Late effect of cerebrovascular accident (CVA)    Diabetes mellitus ([place])    Hypertension associated with type 2 diabetes mellitus ([place])    Hx-TIA (transient ischemic attack)     Conditions to be addressed/monitored: monitor client completion of ADLs. Monitor client management of mood issues.  Care Plan : LCSW Care Plan  Updates made by Katha Cabal, LCSW since 04/18/2021 12:00 AM     Problem: Coping Skills (General Plan of Care)      Goal: Coping Skills Enhanced;complete ADLs as needed; manage mood issues faced   Start Date: 04/18/2021  Expected End Date: 07/13/2021  This Visit's Progress: On track  Recent Progress: On track  Priority: Medium  Note:   Current barriers:   Patient in need of assistance with connecting to community resources for possible help with completion of daily ADLs Patient is unable to independently navigate community resource options without care coordination support Mood issues Walking challenges (uses a cane to help him walk)  Clinical Goals:  Client or spouse of client will communicate with LCSW in next 30 days to discuss client completion of ADLs Client or spouse of client to communicate with LCSW in next 30 days to discuss client mood and management of mood issues  Clinical Interventions:  Collaboration with Kenneth Fraise, MD regarding development and update of comprehensive plan of care as evidenced by provider attestation and co-signature Discussed client needs with [name], spouse of client Reviewed with [name] the ambulation challenges of client. She said client has stiffness in his muscles when going from a sitting to standing position. She said client uses a cane to help him walk.  Discussed with [name] the appetite of client. She said client has a reduced appetite, is not eating as much as he used to eat. [name] regarding vision of client. She said she thinks client has reduced vision. Discussed family  support for client with [name]. She said that she has a little support occasionally from family to help with client needs. Her son mows their yard. Her daughter helps procure food items for them. Encouraged [name] to call [facility] as needed to discuss nursing needs of client Discussed with [name] client interactions. She said occasionally client is irritable or occasionally may argue with her  Patient Strengths/Coping Skills: Has strong support from spouse, [name] Completes most ADLs on his own Allows time to rest and relax  Patient Deficits:  Memory issues Some walking challenges Some mood issues  Patient Goals: In next 30 days, patient will:  Attend all scheduled medical appointments Communicate regularly with spouse, [name], to discuss client needs Take medications as prescribed Allow time to rest and practice self care activities -  Follow Up Plan: LCSW to call client or spouse of client on 06/08/21 at 10:00 AM to discuss needs of client      [name].[name] MSW, LCSW Licensed Clinical Social Worker [facility] Care Management [phone]

## 2021-04-18 NOTE — Patient Instructions (Signed)
Visit Information  PATIENT GOALS:  Goals Addressed             This Visit's Progress    Manage My Emotions; Manage Mood issues; Complete ADLs as needed       Timeframe:  Short-Term Goal Priority:  Medium Progress: On Track Start Date:            04/18/21                Expected End Date:          07/13/21            Follow Up Date 06/08/21 at 10:00 AM   Manage Emotions; Manage mood issues; Complete ADLs as needed    Why is this important?   When you are stressed, down or upset, your body reacts too.  For example, your blood pressure may get higher; you may have a headache or stomachache.  When your emotions get the best of you, your body's ability to fight off cold and flu gets weak.  These steps will help you manage your emotions.     Patient Strengths/Coping Skills: Has strong support from spouse, Rollin Kotowski Completes most ADLs on his own Allows time to rest and relax  Patient Deficits:  Memory issues Some walking challenges Some mood issues  Patient Goals: In next 30 days, patient will:  Attend all scheduled medical appointments Communicate regularly with spouse, [name], to discuss client needs Take medications as prescribed Allow time to rest and practice self care activities -  Follow Up Plan: LCSW to call client or spouse of client on 06/08/21 at 10:00 AM to assess client needs    [name].Ioan Landini MSW, LCSW Licensed Clinical Social Worker [facility] Care Management [phone]

## 2021-04-20 ENCOUNTER — Other Ambulatory Visit: Payer: Self-pay

## 2021-04-20 ENCOUNTER — Encounter: Payer: Self-pay | Admitting: Family Medicine

## 2021-04-20 ENCOUNTER — Ambulatory Visit (INDEPENDENT_AMBULATORY_CARE_PROVIDER_SITE_OTHER): Payer: Medicare HMO | Admitting: Family Medicine

## 2021-04-20 VITALS — BP 128/62 | HR 67 | Temp 98.3°F | Ht 72.0 in | Wt 173.8 lb

## 2021-04-20 DIAGNOSIS — I152 Hypertension secondary to endocrine disorders: Secondary | ICD-10-CM

## 2021-04-20 DIAGNOSIS — E1159 Type 2 diabetes mellitus with other circulatory complications: Secondary | ICD-10-CM | POA: Diagnosis not present

## 2021-04-20 DIAGNOSIS — E785 Hyperlipidemia, unspecified: Secondary | ICD-10-CM | POA: Diagnosis not present

## 2021-04-20 DIAGNOSIS — E1169 Type 2 diabetes mellitus with other specified complication: Secondary | ICD-10-CM

## 2021-04-20 DIAGNOSIS — Z23 Encounter for immunization: Secondary | ICD-10-CM | POA: Diagnosis not present

## 2021-04-20 DIAGNOSIS — Z794 Long term (current) use of insulin: Secondary | ICD-10-CM | POA: Diagnosis not present

## 2021-04-20 DIAGNOSIS — Z125 Encounter for screening for malignant neoplasm of prostate: Secondary | ICD-10-CM

## 2021-04-20 DIAGNOSIS — F0150 Vascular dementia without behavioral disturbance: Secondary | ICD-10-CM | POA: Diagnosis not present

## 2021-04-20 LAB — BAYER DCA HB A1C WAIVED: HB A1C (BAYER DCA - WAIVED): 5.9 % — ABNORMAL HIGH (ref 4.8–5.6)

## 2021-04-20 MED ORDER — DONEPEZIL HCL 23 MG PO TABS: 23.0000 mg | ORAL_TABLET | Freq: Every day | ORAL | 1 refills | Status: DC

## 2021-04-20 NOTE — Progress Notes (Signed)
Subjective:  Patient ID: [name],  male    DOB: [birth year]  Age: 85 y.o.    CC: Medical Management of Chronic Issues   HPI [name] presents for  follow-up of hypertension. Patient has no history of headache chest pain or shortness of breath or recent cough. Patient also denies symptoms of TIA such as numbness weakness lateralizing. Patient denies side effects from medication. States taking it regularly.  Wife shares concern that [name] is losing ability to converse, think clearly.   Patient also  in for follow-up of elevated cholesterol. Doing well without complaints on current medication. Denies side effects  including myalgia and arthralgia and nausea. Also in today for liver function testing. Currently no chest pain, shortness of breath or other cardiovascular related symptoms noted.  Follow-up of diabetes. Patient does check blood sugar at home. Readings run between 120 and 130. Poor appetite. However, [name] is glad to be getting smaller. Patient denies symptoms such as excessive hunger or urinary frequency, excessive hunger, nausea No significant hypoglycemic spells noted. Medications reviewed. Pt reports taking them regularly. Pt. denies complication/adverse reaction today.    History [name] has a past medical history of Anxiety, Asthma, Choledocholithiasis (02/24/2013), COPD (chronic obstructive pulmonary disease) ([place]), CVA (cerebral vascular accident) ([place]) (2013), Dementia ([place]), Diabetes mellitus, TIA (transient ischemic attack), Hyperlipidemia, and Hypertension.   [name] has a past surgical history that includes Rotator cuff repair (Right); Cholecystectomy (N/A, 02/23/2013); ERCP (N/A, 02/24/2013); and Cataract extraction w/PHACO (Left, 04/30/2016).   His family history includes Diabetes in his sister and sister; Emphysema in his brother; Heart disease in his brother; Hypertension in his father and mother.[name] reports that [name] has never smoked. [name] has never used smokeless tobacco.  [name] reports that [name] does not drink alcohol and does not use drugs.  Current Outpatient Medications on File Prior to Visit  Medication Sig Dispense Refill   Accu-Chek Softclix Lancets lancets Check glucose twice a day Dx E11.9 200 each 3   albuterol (VENTOLIN HFA) 108 (90 Base) MCG/ACT inhaler Inhale 2 puffs into the lungs every 4 (four) hours as needed for wheezing or shortness of breath. 18 g 5   Alcohol Swabs (B-D SINGLE USE SWABS REGULAR) PADS Check glucose twice a day Dx E11.9 200 each 3   aspirin EC 81 MG tablet Take 162 mg by mouth daily.      atorvastatin (LIPITOR) 40 MG tablet Take 1 tablet (40 mg total) by mouth daily. 90 tablet 3   Blood Glucose Calibration (ACCU-CHEK AVIVA) SOLN Use with glucometer Dx E11.9 3 each 0   Blood Glucose Monitoring Suppl (ACCU-CHEK GUIDE) w/Device KIT Check glucose twice a day Dx E11.9 1 kit 0   diclofenac Sodium (VOLTAREN) 1 % GEL Apply 2 g topically 4 (four) times daily. 350 g 1   fluticasone furoate-vilanterol (BREO ELLIPTA) 100-25 MCG/INH AEPB Inhale 1 puff into the lungs daily. 30 each 5   glucose blood (ACCU-CHEK GUIDE) test strip Check glucose twice a day Dx E11.9 200 each 3   Insulin Pen Needle (PEN NEEDLES) 32G X 4 MM MISC 1 each by Does not apply route 2 (two) times daily. 90 each 3   ipratropium-albuterol (DUONEB) 0.5-2.5 (3) MG/3ML SOLN Take 3 mLs by nebulization every 4 (four) hours as needed. 360 mL 0   losartan (COZAAR) 50 MG tablet Take 1 tablet (50 mg total) by mouth daily. 90 tablet 3   memantine (NAMENDA) 10 MG tablet Take 1 tablet (10 mg total) by  mouth 2 (two) times daily. 180 tablet 3   sildenafil (REVATIO) 20 MG tablet Take 2-5 pills at once, orally, with each sexual encounter 50 tablet 5   TRESIBA FLEXTOUCH 100 UNIT/ML FlexTouch Pen Inject 0.2 mLs (20 Units total) into the skin daily at 10 pm. 5 pen 3   No current facility-administered medications on file prior to visit.    ROS Review of Systems  Constitutional: Negative.   Negative for unexpected weight change (Pt. eating less to lose weight).  HENT: Negative.    Eyes:  Negative for visual disturbance.  Respiratory:  Negative for cough and shortness of breath.   Cardiovascular:  Negative for chest pain and leg swelling.  Gastrointestinal:  Negative for abdominal pain, diarrhea, nausea and vomiting.  Genitourinary:  Negative for difficulty urinating.  Musculoskeletal:  Negative for arthralgias and myalgias.  Skin:  Negative for rash.  Neurological:  Negative for headaches.  Psychiatric/Behavioral:  Negative for sleep disturbance.    Objective:  BP 128/62   Pulse 67   Temp 98.3 F (36.8 C)   Ht 6' (1.829 m)   Wt 173 lb 12.8 oz (78.8 kg)   SpO2 100%   BMI 23.57 kg/m   BP Readings from Last 3 Encounters:  04/20/21 128/62  01/12/21 116/64  12/24/20 130/71    Wt Readings from Last 3 Encounters:  04/20/21 173 lb 12.8 oz (78.8 kg)  01/12/21 179 lb 6.4 oz (81.4 kg)  12/29/20 183 lb (83 kg)   Weight decrease 22 lb since 01/05/20  Physical Exam Vitals reviewed.  Constitutional:      Appearance: [name] is well-developed.  HENT:     Head: Normocephalic and atraumatic.     Right Ear: External ear normal.     Left Ear: External ear normal.     Mouth/Throat:     Pharynx: No oropharyngeal exudate or posterior oropharyngeal erythema.  Eyes:     Pupils: Pupils are equal, round, and reactive to light.  Cardiovascular:     Rate and Rhythm: Normal rate and regular rhythm.     Heart sounds: No murmur heard. Pulmonary:     Effort: No respiratory distress.     Breath sounds: Normal breath sounds.  Musculoskeletal:     Cervical back: Normal range of motion and neck supple.  Neurological:     Mental Status: [name] is alert and oriented to person, place, and time.    Diabetic Foot Exam - Simple   Simple Foot Form Diabetic Foot exam was performed with the following findings: Yes 04/20/2021  2:05 PM  Visual Inspection No deformities, no ulcerations, no other  skin breakdown bilaterally: Yes Sensation Testing Intact to touch and monofilament testing bilaterally: Yes Pulse Check Posterior Tibialis and Dorsalis pulse intact bilaterally: Yes Comments       Assessment & Plan:   [name] was seen today for medical management of chronic issues.  Diagnoses and all orders for this visit:  Type 2 diabetes mellitus with other specified complication, with long-term current use of insulin ([place]) -     Bayer DCA Hb A1c Waived -     CBC with Differential/Platelet  Hypertension associated with type 2 diabetes mellitus ([place]) -     CMP14+EGFR  Hyperlipidemia associated with type 2 diabetes mellitus ([place]) -     Lipid panel  Prostate cancer screening -     PSA, total and free  Vascular dementia without behavioral disturbance (HCC) -     donepezil ([name]) 23 MG TABS tablet; Take  1 tablet (23 mg total) by mouth at bedtime.  I am having [name] maintain his aspirin EC, Pen Needles, diclofenac Sodium, Tyler Aas FlexTouch, sildenafil, albuterol, Breo Ellipta, ipratropium-albuterol, Accu-Chek Aviva, Accu-Chek Softclix Lancets, B-D SINGLE USE SWABS REGULAR, Accu-Chek Guide, Accu-Chek Guide, atorvastatin, memantine, losartan, and donepezil.  Meds ordered this encounter  Medications   donepezil ([name]) 23 MG TABS tablet    Sig: Take 1 tablet (23 mg total) by mouth at bedtime.    Dispense:  90 tablet    Refill:  1     Follow-up: Return in about 3 months (around 07/21/2021) for diabetes.  Claretta Fraise, M.D.

## 2021-04-21 LAB — CMP14+EGFR
ALT: 16 IU/L (ref 0–44)
AST: 16 IU/L (ref 0–40)
Albumin/Globulin Ratio: 1.6 (ref 1.2–2.2)
Albumin: 4.1 g/dL (ref 3.6–4.6)
Alkaline Phosphatase: 92 IU/L (ref 44–121)
BUN/Creatinine Ratio: 12 (ref 10–24)
BUN: 15 mg/dL (ref 8–27)
Bilirubin Total: 0.7 mg/dL (ref 0.0–1.2)
CO2: 21 mmol/L (ref 20–29)
Calcium: 9.1 mg/dL (ref 8.6–10.2)
Chloride: 108 mmol/L — ABNORMAL HIGH (ref 96–106)
Creatinine, Ser: 1.26 mg/dL (ref 0.76–1.27)
Globulin, Total: 2.6 g/dL (ref 1.5–4.5)
Glucose: 112 mg/dL — ABNORMAL HIGH (ref 70–99)
Potassium: 4.2 mmol/L (ref 3.5–5.2)
Sodium: 143 mmol/L (ref 134–144)
Total Protein: 6.7 g/dL (ref 6.0–8.5)
eGFR: 57 mL/min/{1.73_m2} — ABNORMAL LOW (ref >59)

## 2021-04-21 LAB — CBC WITH DIFFERENTIAL/PLATELET
Basophils Absolute: 0.1 10*3/uL (ref 0.0–0.2)
Basos: 1 %
EOS (ABSOLUTE): 0.3 10*3/uL (ref 0.0–0.4)
Eos: 4 %
Hematocrit: 35.4 % — ABNORMAL LOW (ref 37.5–51.0)
Hemoglobin: 12.4 g/dL — ABNORMAL LOW (ref 13.0–17.7)
Immature Grans (Abs): 0 10*3/uL (ref 0.0–0.1)
Immature Granulocytes: 0 %
Lymphocytes Absolute: 1.6 10*3/uL (ref 0.7–3.1)
Lymphs: 23 %
MCH: 31.3 pg (ref 26.6–33.0)
MCHC: 35 g/dL (ref 31.5–35.7)
MCV: 89 fL (ref 79–97)
Monocytes Absolute: 0.5 10*3/uL (ref 0.1–0.9)
Monocytes: 7 %
Neutrophils Absolute: 4.8 10*3/uL (ref 1.4–7.0)
Neutrophils: 65 %
Platelets: 188 10*3/uL (ref 150–450)
RBC: 3.96 x10E6/uL — ABNORMAL LOW (ref 4.14–5.80)
RDW: 12.8 % (ref 11.6–15.4)
WBC: 7.2 10*3/uL (ref 3.4–10.8)

## 2021-04-21 LAB — LIPID PANEL
Chol/HDL Ratio: 2 ratio (ref 0.0–5.0)
Cholesterol, Total: 85 mg/dL — ABNORMAL LOW (ref 100–199)
HDL: 42 mg/dL (ref >39)
LDL Chol Calc (NIH): 26 mg/dL (ref 0–99)
Triglycerides: 84 mg/dL (ref 0–149)
VLDL Cholesterol Cal: 17 mg/dL (ref 5–40)

## 2021-04-21 LAB — PSA, TOTAL AND FREE
PSA, Free Pct: 40 %
PSA, Free: 0.12 ng/mL
Prostate Specific Ag, Serum: 0.3 ng/mL (ref 0.0–4.0)

## 2021-04-21 NOTE — Progress Notes (Signed)
Hello [name],  Your lab result is normal and/or stable.Some minor variations that are not significant are commonly marked abnormal, but do not represent any medical problem for you.  Best regards, Senita Corredor, M.D.

## 2021-05-08 DIAGNOSIS — E1169 Type 2 diabetes mellitus with other specified complication: Secondary | ICD-10-CM

## 2021-05-08 DIAGNOSIS — J45909 Unspecified asthma, uncomplicated: Secondary | ICD-10-CM

## 2021-05-08 DIAGNOSIS — Z794 Long term (current) use of insulin: Secondary | ICD-10-CM | POA: Diagnosis not present

## 2021-05-08 DIAGNOSIS — E1159 Type 2 diabetes mellitus with other circulatory complications: Secondary | ICD-10-CM | POA: Diagnosis not present

## 2021-05-08 DIAGNOSIS — F0150 Vascular dementia without behavioral disturbance: Secondary | ICD-10-CM

## 2021-05-08 DIAGNOSIS — J449 Chronic obstructive pulmonary disease, unspecified: Secondary | ICD-10-CM | POA: Diagnosis not present

## 2021-05-08 DIAGNOSIS — E785 Hyperlipidemia, unspecified: Secondary | ICD-10-CM | POA: Diagnosis not present

## 2021-05-08 DIAGNOSIS — I152 Hypertension secondary to endocrine disorders: Secondary | ICD-10-CM | POA: Diagnosis not present

## 2021-06-08 ENCOUNTER — Ambulatory Visit (INDEPENDENT_AMBULATORY_CARE_PROVIDER_SITE_OTHER): Payer: Medicare HMO | Admitting: Licensed Clinical Social Worker

## 2021-06-08 DIAGNOSIS — E785 Hyperlipidemia, unspecified: Secondary | ICD-10-CM

## 2021-06-08 DIAGNOSIS — I152 Hypertension secondary to endocrine disorders: Secondary | ICD-10-CM

## 2021-06-08 DIAGNOSIS — Z794 Long term (current) use of insulin: Secondary | ICD-10-CM

## 2021-06-08 DIAGNOSIS — Z8673 Personal history of transient ischemic attack (TIA), and cerebral infarction without residual deficits: Secondary | ICD-10-CM

## 2021-06-08 DIAGNOSIS — J449 Chronic obstructive pulmonary disease, unspecified: Secondary | ICD-10-CM

## 2021-06-08 DIAGNOSIS — F0150 Vascular dementia without behavioral disturbance: Secondary | ICD-10-CM

## 2021-06-08 DIAGNOSIS — E1169 Type 2 diabetes mellitus with other specified complication: Secondary | ICD-10-CM

## 2021-06-08 DIAGNOSIS — J45909 Unspecified asthma, uncomplicated: Secondary | ICD-10-CM

## 2021-06-08 NOTE — Chronic Care Management (AMB) (Signed)
Chronic Care Management    Clinical Social Work Note  06/08/2021 Name: [name] MRN: [id number] DOB: 10-13-1935  [name] is a 85 y.o. year old male who is a primary care patient of [name], [name], MD. The CCM team was consulted to assist the patient with chronic disease management and/or care coordination needs related to: [name] .   Engaged with patient / spouse of patient, [name], samples telephone for follow up visit in response to provider referral for social work chronic care management and care coordination services.   Consent to Services:  The patient was given information about Chronic Care Management services, agreed to services, and gave verbal consent prior to initiation of services.  Please see initial visit note for detailed documentation.   Patient agreed to services and consent obtained.   Assessment: Review of patient past medical history, allergies, medications, and health status, including review of relevant consultants reports was performed today as part of a comprehensive evaluation and provision of chronic care management and care coordination services.     SDOH (Social Determinants of Health) assessments and interventions performed:  SDOH Interventions    Flowsheet Row Most Recent Value  SDOH Interventions   Physical Activity Interventions Other (Comments)  [walking challenges,  uses a cane to help him walk]  Stress Interventions Other (Comment)  [has stress related to managing medical needs]  Depression Interventions/Treatment  --  [informed spouse of client of LCSW support for client and of RNCM support for client]        Advanced Directives Status: See Vynca application for related entries.  CCM Care Plan  Allergies  Allergen Reactions   Ace Inhibitors Swelling    Outpatient Encounter Medications as of 06/08/2021  Medication Sig   Accu-Chek Softclix Lancets lancets Check glucose twice a day Dx E11.9   albuterol (VENTOLIN HFA) 108  (90 Base) MCG/ACT inhaler Inhale 2 puffs into the lungs every 4 (four) hours as needed for wheezing or shortness of breath.   Alcohol Swabs (B-D SINGLE USE SWABS REGULAR) PADS Check glucose twice a day Dx E11.9   aspirin EC 81 MG tablet Take 162 mg by mouth daily.    atorvastatin (LIPITOR) 40 MG tablet Take 1 tablet (40 mg total) by mouth daily.   Blood Glucose Calibration (ACCU-CHEK AVIVA) SOLN Use with glucometer Dx E11.9   Blood Glucose Monitoring Suppl (ACCU-CHEK GUIDE) w/Device KIT Check glucose twice a day Dx E11.9   diclofenac Sodium (VOLTAREN) 1 % GEL Apply 2 g topically 4 (four) times daily.   donepezil ([name]) 23 MG TABS tablet Take 1 tablet (23 mg total) by mouth at bedtime.   fluticasone furoate-vilanterol (BREO ELLIPTA) 100-25 MCG/INH AEPB Inhale 1 puff into the lungs daily.   glucose blood (ACCU-CHEK GUIDE) test strip Check glucose twice a day Dx E11.9   Insulin Pen Needle (PEN NEEDLES) 32G X 4 MM MISC 1 each by Does not apply route 2 (two) times daily.   ipratropium-albuterol (DUONEB) 0.5-2.5 (3) MG/3ML SOLN Take 3 mLs by nebulization every 4 (four) hours as needed.   losartan (COZAAR) 50 MG tablet Take 1 tablet (50 mg total) by mouth daily.   memantine (NAMENDA) 10 MG tablet Take 1 tablet (10 mg total) by mouth 2 (two) times daily.   sildenafil (REVATIO) 20 MG tablet Take 2-5 pills at once, orally, with each sexual encounter   TRESIBA FLEXTOUCH 100 UNIT/ML FlexTouch Pen Inject 0.2 mLs (20 Units total) into the skin daily at 10 pm.  No facility-administered encounter medications on file as of 06/08/2021.    Patient Active Problem List   Diagnosis Date Noted   Asthma    COPD (chronic obstructive pulmonary disease) ([place])    Hyperlipidemia associated with type 2 diabetes mellitus ([place]) 11/27/2018   Intrinsic asthma 03/31/2015   Dementia without behavioral disturbance (HCC)    Atelectasis of right lung 01/29/2014   Pleural effusion on right 11/24/2013   Late effect of  cerebrovascular accident (CVA)    Diabetes mellitus ([place])    Hypertension associated with type 2 diabetes mellitus ([place])    Hx-TIA (transient ischemic attack)     Conditions to be addressed/monitored: monitor client completion of ADLs  Care Plan : LCSW Care Plan  Updates made by Katha Cabal, LCSW since 06/08/2021 12:00 AM     Problem: Coping Skills (General Plan of Care)      Goal: Coping Skills Enhanced;complete ADLs as needed; manage mood issues faced   Start Date: 06/08/2021  Expected End Date: 09/04/2021  This Visit's Progress: On track  Recent Progress: On track  Priority: Medium  Note:   Current barriers:   Patient in need of assistance with connecting to community resources for possible help with completion of daily ADLs Patient is unable to independently navigate community resource options without care coordination support Mood issues Walking challenges (uses a cane to help him walk)  Clinical Goals:  Client or spouse of client will communicate with LCSW in next 30 days to discuss client completion of ADLs Client or spouse of client to communicate with LCSW in next 30 days to discuss client mood and management of mood issues  Clinical Interventions:  Collaboration with Claretta Fraise, MD regarding development and update of comprehensive plan of care as evidenced by provider attestation and co-signature Discussed client needs with [name], spouse of client Reviewed with [name] the ambulation challenges of client. She said client uses a cane to help him walk.  Discussed with [name] the appetite of client.  Elveria Royals regarding vision of client. She said she thinks client has reduced vision. Discussed family support for client with [name]. She said son mows their yard. Her daughter helps procure food items for them. Encouraged [name] to call [facility] as needed to discuss nursing needs of client Discussed mood of client. [name] said occasionally client is irritable or  occasionally may argue with her. Discussed pain issues of client. [name] said that when client walks a longer distance he has some pain issues Reviewed transport needs of client. [name] transports client to and from client appointments  Patient Strengths/Coping Skills: Has strong support from spouse, Leeman Johnsey Completes most ADLs on his own Allows time to rest and relax  Patient Deficits:  Memory issues Some walking challenges Some mood issues  Patient Goals: In next 30 days, patient will:  Attend all scheduled medical appointments Communicate regularly with spouse, [name], to discuss client needs Take medications as prescribed Allow time to rest and practice self care activities -  Follow Up Plan: LCSW to call client or spouse of client on 08/08/21 at 2:00 PM to discuss needs of client      [name].Lorelle Macaluso MSW, LCSW Licensed Clinical Social Worker [facility] Care Management [phone]

## 2021-06-08 NOTE — Patient Instructions (Addendum)
Visit Information  Patient Goals:  Manage Emotions; Manage mood issues. Complete ADLs as needed  Timeframe:  Short-Term Goal Priority:  Medium Progress: On Track Start Date:           06/08/21                Expected End Date:         09/04/21            Follow Up Date 08/08/21 at 2:00 PM   Manage Emotions; Manage mood issues; Complete ADLs as needed    Why is this important?   When you are stressed, down or upset, your body reacts too.  For example, your blood pressure may get higher; you may have a headache or stomachache.  When your emotions get the best of you, your body's ability to fight off cold and flu gets weak.  These steps will help you manage your emotions.     Patient Strengths/Coping Skills: Has strong support from spouse, Catrell Morrone Completes most ADLs on his own Allows time to rest and relax  Patient Deficits:  Memory issues Some walking challenges Some mood issues  Patient Goals: In next 30 days, patient will:  Attend all scheduled medical appointments Communicate regularly with spouse, [name], to discuss client needs Take medications as prescribed Allow time to rest and practice self care activities -  Follow Up Plan: LCSW to call client or spouse of client on 08/08/21 at 2:00 PM to assess client needs  [name].Rachael Ferrie MSW, LCSW Licensed Clinical Social Worker [facility] Care Management [phone]

## 2021-07-08 DIAGNOSIS — F0150 Vascular dementia without behavioral disturbance: Secondary | ICD-10-CM

## 2021-07-08 DIAGNOSIS — E1159 Type 2 diabetes mellitus with other circulatory complications: Secondary | ICD-10-CM

## 2021-07-08 DIAGNOSIS — E1169 Type 2 diabetes mellitus with other specified complication: Secondary | ICD-10-CM

## 2021-07-08 DIAGNOSIS — Z794 Long term (current) use of insulin: Secondary | ICD-10-CM

## 2021-07-08 DIAGNOSIS — J449 Chronic obstructive pulmonary disease, unspecified: Secondary | ICD-10-CM

## 2021-07-08 DIAGNOSIS — J45909 Unspecified asthma, uncomplicated: Secondary | ICD-10-CM

## 2021-07-08 DIAGNOSIS — I152 Hypertension secondary to endocrine disorders: Secondary | ICD-10-CM

## 2021-07-08 DIAGNOSIS — E785 Hyperlipidemia, unspecified: Secondary | ICD-10-CM

## 2021-07-24 ENCOUNTER — Ambulatory Visit: Payer: Medicare HMO | Admitting: Family Medicine

## 2021-07-25 ENCOUNTER — Encounter: Payer: Self-pay | Admitting: Family Medicine

## 2021-07-25 DIAGNOSIS — Z0100 Encounter for examination of eyes and vision without abnormal findings: Secondary | ICD-10-CM | POA: Diagnosis not present

## 2021-07-25 DIAGNOSIS — E119 Type 2 diabetes mellitus without complications: Secondary | ICD-10-CM | POA: Diagnosis not present

## 2021-07-25 DIAGNOSIS — H2511 Age-related nuclear cataract, right eye: Secondary | ICD-10-CM | POA: Diagnosis not present

## 2021-07-25 DIAGNOSIS — E7800 Pure hypercholesterolemia, unspecified: Secondary | ICD-10-CM | POA: Diagnosis not present

## 2021-07-25 DIAGNOSIS — H5210 Myopia, unspecified eye: Secondary | ICD-10-CM | POA: Diagnosis not present

## 2021-08-08 ENCOUNTER — Ambulatory Visit (INDEPENDENT_AMBULATORY_CARE_PROVIDER_SITE_OTHER): Payer: Medicare HMO

## 2021-08-08 DIAGNOSIS — E785 Hyperlipidemia, unspecified: Secondary | ICD-10-CM

## 2021-08-08 DIAGNOSIS — J449 Chronic obstructive pulmonary disease, unspecified: Secondary | ICD-10-CM

## 2021-08-08 DIAGNOSIS — I152 Hypertension secondary to endocrine disorders: Secondary | ICD-10-CM

## 2021-08-08 DIAGNOSIS — E1169 Type 2 diabetes mellitus with other specified complication: Secondary | ICD-10-CM

## 2021-08-08 DIAGNOSIS — F0150 Vascular dementia without behavioral disturbance: Secondary | ICD-10-CM

## 2021-08-08 DIAGNOSIS — J45909 Unspecified asthma, uncomplicated: Secondary | ICD-10-CM

## 2021-08-08 DIAGNOSIS — Z794 Long term (current) use of insulin: Secondary | ICD-10-CM

## 2021-08-08 DIAGNOSIS — E1159 Type 2 diabetes mellitus with other circulatory complications: Secondary | ICD-10-CM

## 2021-08-08 DIAGNOSIS — Z8673 Personal history of transient ischemic attack (TIA), and cerebral infarction without residual deficits: Secondary | ICD-10-CM

## 2021-08-08 NOTE — Chronic Care Management (AMB) (Signed)
Chronic Care Management    Clinical Social Work Note  08/08/2021 Name: [name] MRN: [id number] DOB: [birth year]  [name] is a 86 y.o. year old male who is a primary care patient of [name], [name], MD. The CCM team was consulted to assist the patient with chronic disease management and/or care coordination needs related to: [name] .   Engaged with patient /spouse of patient, [name], by telephone for follow up visit in response to provider referral for social work chronic care management and care coordination services.   Consent to Services:  The patient was given information about Chronic Care Management services, agreed to services, and gave verbal consent prior to initiation of services.  Please see initial visit note for detailed documentation.   Patient agreed to services and consent obtained.   Assessment: Review of patient past medical history, allergies, medications, and health status, including review of relevant consultants reports was performed today as part of a comprehensive evaluation and provision of chronic care management and care coordination services.     SDOH (Social Determinants of Health) assessments and interventions performed:  SDOH Interventions    Flowsheet Row Most Recent Value  SDOH Interventions   Physical Activity Interventions Other (Comments)  [Has walking challenges. uses a cane to help him walk]  Stress Interventions Other (Comment)  [client has stress related to memory issues. He has stress related to managing medical needs]  Depression Interventions/Treatment  --  [informed [name], spouse of patient, about LCSW support for client and about RNCM support for client]        Advanced Directives Status: See Vynca application for related entries.  CCM Care Plan  Allergies  Allergen Reactions   Ace Inhibitors Swelling    Outpatient Encounter Medications as of 08/08/2021  Medication Sig   Accu-Chek Softclix Lancets  lancets Check glucose twice a day Dx E11.9   albuterol (VENTOLIN HFA) 108 (90 Base) MCG/ACT inhaler Inhale 2 puffs into the lungs every 4 (four) hours as needed for wheezing or shortness of breath.   Alcohol Swabs (B-D SINGLE USE SWABS REGULAR) PADS Check glucose twice a day Dx E11.9   aspirin EC 81 MG tablet Take 162 mg by mouth daily.    atorvastatin (LIPITOR) 40 MG tablet Take 1 tablet (40 mg total) by mouth daily.   Blood Glucose Calibration (ACCU-CHEK AVIVA) SOLN Use with glucometer Dx E11.9   Blood Glucose Monitoring Suppl (ACCU-CHEK GUIDE) w/Device KIT Check glucose twice a day Dx E11.9   diclofenac Sodium (VOLTAREN) 1 % GEL Apply 2 g topically 4 (four) times daily.   donepezil ([name]) 23 MG TABS tablet Take 1 tablet (23 mg total) by mouth at bedtime.   fluticasone furoate-vilanterol (BREO ELLIPTA) 100-25 MCG/INH AEPB Inhale 1 puff into the lungs daily.   glucose blood (ACCU-CHEK GUIDE) test strip Check glucose twice a day Dx E11.9   Insulin Pen Needle (PEN NEEDLES) 32G X 4 MM MISC 1 each by Does not apply route 2 (two) times daily.   ipratropium-albuterol (DUONEB) 0.5-2.5 (3) MG/3ML SOLN Take 3 mLs by nebulization every 4 (four) hours as needed.   losartan (COZAAR) 50 MG tablet Take 1 tablet (50 mg total) by mouth daily.   memantine (NAMENDA) 10 MG tablet Take 1 tablet (10 mg total) by mouth 2 (two) times daily.   sildenafil (REVATIO) 20 MG tablet Take 2-5 pills at once, orally, with each sexual encounter   TRESIBA FLEXTOUCH 100 UNIT/ML FlexTouch Pen Inject 0.2 mLs (20  Units total) into the skin daily at 10 pm.   No facility-administered encounter medications on file as of 08/08/2021.    Patient Active Problem List   Diagnosis Date Noted   Asthma    COPD (chronic obstructive pulmonary disease) ([place])    Hyperlipidemia associated with type 2 diabetes mellitus ([place]) 11/27/2018   Intrinsic asthma 03/31/2015   Dementia without behavioral disturbance (HCC)    Atelectasis of right lung  01/29/2014   Pleural effusion on right 11/24/2013   Late effect of cerebrovascular accident (CVA)    Diabetes mellitus ([place])    Hypertension associated with type 2 diabetes mellitus ([place])    Hx-TIA (transient ischemic attack)     Conditions to be addressed/monitored: Discussed client completion of ADLs.   Care Plan : LCSW Care Plan  Updates made by Katha Cabal, LCSW since 08/08/2021 12:00 AM     Problem: Coping Skills (General Plan of Care)      Goal: Coping Skills Enhanced;complete ADLs as needed; manage mood issues faced   Start Date: 08/08/2021  Expected End Date: 11/01/2021  This Visit's Progress: On track  Recent Progress: On track  Priority: Medium  Note:   Current barriers:   Patient in need of assistance with connecting to community resources for possible help with completion of daily ADLs Patient is unable to independently navigate community resource options without care coordination support Mood issues Walking challenges (uses a cane to help him walk)  Clinical Goals:  Client or spouse of client will communicate with LCSW in next 30 days to discuss client completion of ADLs Client or spouse of client to communicate with LCSW in next 30 days to discuss client mood and management of mood issues Client or spouse of client to talk with SW in next 30 days about mobility issues of client  Clinical Interventions:  Collaboration with Claretta Fraise, MD regarding development and update of comprehensive plan of care as evidenced by provider attestation and co-signature Discussed client needs with [name], spouse of client Reviewed with [name] the ambulation challenges of client. She said client uses a cane to help him walk.  Discussed with [name] the appetite of client.  Elveria Royals regarding vision of client. She said she thinks client has reduced vision. She said client is waiting to receive new pair of glasses Discussed family support for client with [name]. She  said son is supportive, daughter is supportive; she said grandson is also helpful and supportive.  Encouraged [name] to call [facility] as needed to discuss nursing needs of client Discussed mood of client. [name] said occasionally client is irritable or occasionally may argue with her. She said occasionally client yells. She said this usually happens in the morning or late in the day.   Reviewed with [name] self care for herself as she is caring daily for client. Discussed rest breaks for [facility]. Discussed [place] with Arvel on a routine schedule. Discussed following a daily routine to help Donielle as well as [name]. Discussed Caregiving Stress issues possibly for [place]. LCSW encouraged [name] to allow other family members to care for [place] occasionally in order to give her a break  She said she likes geneology and enjoys studying family geneology.  LCSW encouraged [name] to take time as able for this hobby for herself. Reviewed pain issues of client. She said Yousof sometimes speaks of pain in his eyes. Discussed client completion of ADLs. She said she helps client with ADLs completion. Reviewed transport needs of client. She  said she transports client to some appointments. She said her son also helps in transporting Rion to and from some of client's appointments. Reviewed sleeping schedule of client. She said Emeril will often wake up about 4;00 AM and get up.  This is challenging for her since she may need to rest but feels that she needs to ensure he is ok when he is up.  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  Patient Strengths/Coping Skills: Has strong support from spouse, Dary Dilauro Completes most ADLs on his own Allows time to rest and relax  Patient Deficits:  Memory issues Some walking challenges Some mood issues  Patient Goals: In next 30 days, patient will:  Attend all scheduled medical appointments Communicate regularly with  spouse, [name], to discuss client needs Take medications as prescribed Allow time to rest and practice self care activities -  Follow Up Plan: LCSW to call client or spouse of client on 09/28/21 at 3:00 PM to discuss needs of client     [name].Chanelle Hodsdon MSW, [place] Holiday representative [facility] Care Management [phone]

## 2021-08-08 NOTE — Patient Instructions (Addendum)
Visit Information  Patient Goals:  Manage Emotions. Manage Mood issues.Complete ADLs as needed  Timeframe:  Short-Term Goal Priority:  Medium Progress: On Track Start Date:          08/08/21                Expected End Date:        10/30/21            Follow Up Date 09/28/21 at 3:00 PM    Manage Emotions; Manage mood issues; Complete ADLs as needed    Why is this important?   When you are stressed, down or upset, your body reacts too.  For example, your blood pressure may get higher; you may have a headache or stomachache.  When your emotions get the best of you, your body's ability to fight off cold and flu gets weak.  These steps will help you manage your emotions.     Patient Strengths/Coping Skills: Has strong support from spouse, Zavien Clubb Completes most ADLs on his own Allows time to rest and relax  Patient Deficits:  Memory issues Some walking challenges Some mood issues  Patient Goals: In next 30 days, patient will:  Attend all scheduled medical appointments Communicate regularly with spouse, [name], to discuss client needs Take medications as prescribed Allow time to rest and practice self care activities -  Follow Up Plan: LCSW to call client or spouse of client on 09/28/21 at 3:00 PM to assess client needs  [name].Chrishana Spargur MSW, [place] Holiday representative [facility] Care Management [phone]

## 2021-08-21 ENCOUNTER — Ambulatory Visit: Payer: Medicare HMO | Admitting: Family Medicine

## 2021-08-23 ENCOUNTER — Encounter: Payer: Medicare HMO | Admitting: *Deleted

## 2021-08-31 ENCOUNTER — Ambulatory Visit (INDEPENDENT_AMBULATORY_CARE_PROVIDER_SITE_OTHER): Payer: Medicare HMO | Admitting: Family Medicine

## 2021-08-31 ENCOUNTER — Encounter: Payer: Self-pay | Admitting: Family Medicine

## 2021-08-31 VITALS — BP 137/66 | HR 65 | Temp 97.6°F | Ht 72.0 in | Wt 175.8 lb

## 2021-08-31 DIAGNOSIS — E785 Hyperlipidemia, unspecified: Secondary | ICD-10-CM | POA: Diagnosis not present

## 2021-08-31 DIAGNOSIS — I152 Hypertension secondary to endocrine disorders: Secondary | ICD-10-CM

## 2021-08-31 DIAGNOSIS — F02818 Dementia in other diseases classified elsewhere, unspecified severity, with other behavioral disturbance: Secondary | ICD-10-CM

## 2021-08-31 DIAGNOSIS — G309 Alzheimer's disease, unspecified: Secondary | ICD-10-CM

## 2021-08-31 DIAGNOSIS — E1159 Type 2 diabetes mellitus with other circulatory complications: Secondary | ICD-10-CM

## 2021-08-31 DIAGNOSIS — E1169 Type 2 diabetes mellitus with other specified complication: Secondary | ICD-10-CM | POA: Diagnosis not present

## 2021-08-31 DIAGNOSIS — Z794 Long term (current) use of insulin: Secondary | ICD-10-CM | POA: Diagnosis not present

## 2021-08-31 DIAGNOSIS — J449 Chronic obstructive pulmonary disease, unspecified: Secondary | ICD-10-CM | POA: Diagnosis not present

## 2021-08-31 LAB — CMP14+EGFR
ALT: 11 IU/L (ref 0–44)
AST: 12 IU/L (ref 0–40)
Albumin/Globulin Ratio: 1.6 (ref 1.2–2.2)
Albumin: 4.3 g/dL (ref 3.6–4.6)
Alkaline Phosphatase: 104 IU/L (ref 44–121)
BUN/Creatinine Ratio: 8 — ABNORMAL LOW (ref 10–24)
BUN: 11 mg/dL (ref 8–27)
Bilirubin Total: 0.8 mg/dL (ref 0.0–1.2)
CO2: 23 mmol/L (ref 20–29)
Calcium: 8.8 mg/dL (ref 8.6–10.2)
Chloride: 107 mmol/L — ABNORMAL HIGH (ref 96–106)
Creatinine, Ser: 1.45 mg/dL — ABNORMAL HIGH (ref 0.76–1.27)
Globulin, Total: 2.7 g/dL (ref 1.5–4.5)
Glucose: 119 mg/dL — ABNORMAL HIGH (ref 70–99)
Potassium: 4.3 mmol/L (ref 3.5–5.2)
Sodium: 142 mmol/L (ref 134–144)
Total Protein: 7 g/dL (ref 6.0–8.5)
eGFR: 48 mL/min/{1.73_m2} — ABNORMAL LOW (ref >59)

## 2021-08-31 LAB — CBC WITH DIFFERENTIAL/PLATELET
Basophils Absolute: 0.1 10*3/uL (ref 0.0–0.2)
Basos: 1 %
EOS (ABSOLUTE): 0.3 10*3/uL (ref 0.0–0.4)
Eos: 3 %
Hematocrit: 35.1 % — ABNORMAL LOW (ref 37.5–51.0)
Hemoglobin: 12.2 g/dL — ABNORMAL LOW (ref 13.0–17.7)
Immature Grans (Abs): 0 10*3/uL (ref 0.0–0.1)
Immature Granulocytes: 0 %
Lymphocytes Absolute: 1.7 10*3/uL (ref 0.7–3.1)
Lymphs: 22 %
MCH: 30.8 pg (ref 26.6–33.0)
MCHC: 34.8 g/dL (ref 31.5–35.7)
MCV: 89 fL (ref 79–97)
Monocytes Absolute: 0.5 10*3/uL (ref 0.1–0.9)
Monocytes: 7 %
Neutrophils Absolute: 5.1 10*3/uL (ref 1.4–7.0)
Neutrophils: 67 %
Platelets: 192 10*3/uL (ref 150–450)
RBC: 3.96 x10E6/uL — ABNORMAL LOW (ref 4.14–5.80)
RDW: 12.4 % (ref 11.6–15.4)
WBC: 7.6 10*3/uL (ref 3.4–10.8)

## 2021-08-31 LAB — BAYER DCA HB A1C WAIVED: HB A1C (BAYER DCA - WAIVED): 5.9 % — ABNORMAL HIGH (ref 4.8–5.6)

## 2021-08-31 LAB — LIPID PANEL
Chol/HDL Ratio: 1.9 ratio (ref 0.0–5.0)
Cholesterol, Total: 74 mg/dL — ABNORMAL LOW (ref 100–199)
HDL: 39 mg/dL — ABNORMAL LOW (ref >39)
LDL Chol Calc (NIH): 19 mg/dL (ref 0–99)
Triglycerides: 74 mg/dL (ref 0–149)
VLDL Cholesterol Cal: 16 mg/dL (ref 5–40)

## 2021-08-31 MED ORDER — QUETIAPINE FUMARATE 25 MG PO TABS: 25.0000 mg | ORAL_TABLET | Freq: Every day | ORAL | 1 refills | Status: DC

## 2021-08-31 NOTE — Progress Notes (Signed)
Subjective:  Patient ID: [name],  male    DOB: 1936/02/25  Age: 86 y.o.    CC: Medical Management of Chronic Issues   HPI [name] presents for  follow-up of hypertension. Patient has no history of headache chest pain or shortness of breath or recent cough. Patient also denies symptoms of TIA such as numbness weakness lateralizing. Patient denies side effects from medication. States taking it regularly.  Patient also  in for follow-up of elevated cholesterol. Doing well without complaints on current medication. Denies side effects  including myalgia and arthralgia and nausea. Also in today for liver function testing. Currently no chest pain, shortness of breath or other cardiovascular related symptoms noted.  Follow-up of diabetes. Patient does not check blood sugar at home. Patient denies symptoms such as excessive hunger or urinary frequency, excessive hunger, nausea No significant hypoglycemic spells noted. Medications reviewed. Pt Recent eye exam was normal.  taking them regularly. Pt. denies complication/adverse reaction today.   Having bad outbursts. Emotional. [place] at times. Not sleeping well. Forgetful. Walks off, goes outside, wanders away. Will go to his brothers house. History [name] has a past medical history of Anxiety, Asthma, Choledocholithiasis (02/24/2013), COPD (chronic obstructive pulmonary disease) ([place]), CVA (cerebral vascular accident) ([place]) (2013), Dementia ([place]), Diabetes mellitus, TIA (transient ischemic attack), Hyperlipidemia, and Hypertension.   He has a past surgical history that includes Rotator cuff repair (Right); Cholecystectomy (N/A, 02/23/2013); ERCP (N/A, 02/24/2013); and Cataract extraction w/PHACO (Left, 04/30/2016).   His family history includes Diabetes in his sister and sister; Emphysema in his brother; Heart disease in his brother; Hypertension in his father and mother.He reports that he has never smoked. He has never used smokeless  tobacco. He reports that he does not drink alcohol and does not use drugs.  Current Outpatient Medications on File Prior to Visit  Medication Sig Dispense Refill   Accu-Chek Softclix Lancets lancets Check glucose twice a day Dx E11.9 200 each 3   albuterol (VENTOLIN HFA) 108 (90 Base) MCG/ACT inhaler Inhale 2 puffs into the lungs every 4 (four) hours as needed for wheezing or shortness of breath. 18 g 5   Alcohol Swabs (B-D SINGLE USE SWABS REGULAR) PADS Check glucose twice a day Dx E11.9 200 each 3   aspirin EC 81 MG tablet Take 162 mg by mouth daily.      atorvastatin (LIPITOR) 40 MG tablet Take 1 tablet (40 mg total) by mouth daily. 90 tablet 3   Blood Glucose Calibration (ACCU-CHEK AVIVA) SOLN Use with glucometer Dx E11.9 3 each 0   Blood Glucose Monitoring Suppl (ACCU-CHEK GUIDE) w/Device KIT Check glucose twice a day Dx E11.9 1 kit 0   diclofenac Sodium (VOLTAREN) 1 % GEL Apply 2 g topically 4 (four) times daily. 350 g 1   fluticasone furoate-vilanterol (BREO ELLIPTA) 100-25 MCG/INH AEPB Inhale 1 puff into the lungs daily. 30 each 5   glucose blood (ACCU-CHEK GUIDE) test strip Check glucose twice a day Dx E11.9 200 each 3   Insulin Pen Needle (PEN NEEDLES) 32G X 4 MM MISC 1 each by Does not apply route 2 (two) times daily. 90 each 3   ipratropium-albuterol (DUONEB) 0.5-2.5 (3) MG/3ML SOLN Take 3 mLs by nebulization every 4 (four) hours as needed. 360 mL 0   losartan (COZAAR) 50 MG tablet Take 1 tablet (50 mg total) by mouth daily. 90 tablet 3   memantine (NAMENDA) 10 MG tablet Take 1 tablet (10 mg total) by mouth 2 (  two) times daily. 180 tablet 3   sildenafil (REVATIO) 20 MG tablet Take 2-5 pills at once, orally, with each sexual encounter 50 tablet 5   TRESIBA FLEXTOUCH 100 UNIT/ML FlexTouch Pen Inject 0.2 mLs (20 Units total) into the skin daily at 10 pm. 5 pen 3   donepezil ([name]) 23 MG TABS tablet Take 1 tablet (23 mg total) by mouth at bedtime. 90 tablet 1   No current  facility-administered medications on file prior to visit.    ROS Review of Systems  Constitutional:  Negative for fever.  Respiratory:  Negative for shortness of breath.   Cardiovascular:  Negative for chest pain.  Musculoskeletal:  Negative for arthralgias.  Skin:  Negative for rash.  Psychiatric/Behavioral:  Positive for agitation, behavioral problems, decreased concentration and sleep disturbance (up frequently through the night. Sometimes goes outside, leaves doors open.). The patient is nervous/anxious (Hx per daughter).    Objective:  BP 137/66    Pulse 65    Temp 97.6 F (36.4 C)    Ht 6' (1.829 m)    Wt 175 lb 12.8 oz (79.7 kg)    SpO2 100%    BMI 23.84 kg/m   BP Readings from Last 3 Encounters:  08/31/21 137/66  04/20/21 128/62  01/12/21 116/64    Wt Readings from Last 3 Encounters:  08/31/21 175 lb 12.8 oz (79.7 kg)  04/20/21 173 lb 12.8 oz (78.8 kg)  01/12/21 179 lb 6.4 oz (81.4 kg)     Physical Exam Constitutional:      General: He is not in acute distress.    Appearance: He is well-developed.  HENT:     Head: Normocephalic and atraumatic.     Right Ear: External ear normal.     Left Ear: External ear normal.     Nose: Nose normal.  Eyes:     Conjunctiva/sclera: Conjunctivae normal.     Pupils: Pupils are equal, round, and reactive to light.  Cardiovascular:     Rate and Rhythm: Normal rate and regular rhythm.     Heart sounds: Normal heart sounds. No murmur heard. Pulmonary:     Effort: Pulmonary effort is normal. No respiratory distress.     Breath sounds: Normal breath sounds. No wheezing or rales.  Abdominal:     Palpations: Abdomen is soft.     Tenderness: There is no abdominal tenderness.  Musculoskeletal:        General: Normal range of motion.     Cervical back: Normal range of motion and neck supple.  Skin:    General: Skin is warm and dry.  Neurological:     General: No focal deficit present.     Mental Status: He is alert and oriented  to person, place, and time.     Sensory: Sensation is intact.     Motor: Motor function is intact.     Gait: Gait is intact.     Deep Tendon Reflexes: Reflexes are normal and symmetric.  Psychiatric:        Mood and Affect: Mood is elated.        Speech: Speech is rapid and pressured.        Behavior: Behavior normal.        Cognition and Memory: Cognition is impaired.         Assessment & Plan:   Markee was seen today for medical management of chronic issues.  Diagnoses and all orders for this visit:  Type 2 diabetes mellitus with other  specified complication, with long-term current use of insulin ([place]) -     Bayer DCA Hb A1c Waived  Hypertension associated with type 2 diabetes mellitus ([place]) -     CBC with Differential/Platelet -     CMP14+EGFR  Hyperlipidemia associated with type 2 diabetes mellitus ([place]) -     Lipid panel  Alzheimer's dementia with behavioral disturbance ([place])  Other orders -     QUEtiapine (SEROQUEL) 25 MG tablet; Take 1 tablet (25 mg total) by mouth at bedtime.   I am having [name] start on QUEtiapine. I am also having him maintain his aspirin EC, Pen Needles, diclofenac Sodium, Tresiba FlexTouch, sildenafil, albuterol, Breo Ellipta, ipratropium-albuterol, Accu-Chek Aviva, Accu-Chek Softclix Lancets, B-D SINGLE USE SWABS REGULAR, Accu-Chek Guide, Accu-Chek Guide, atorvastatin, memantine, losartan, and donepezil.  Meds ordered this encounter  Medications   QUEtiapine (SEROQUEL) 25 MG tablet    Sig: Take 1 tablet (25 mg total) by mouth at bedtime.    Dispense:  90 tablet    Refill:  1     Follow-up: Return in about 3 months (around 11/28/2021).  Claretta Fraise, M.D.

## 2021-09-04 NOTE — Progress Notes (Signed)
Hello Trelyn,  Your lab result is normal and/or stable.Some minor variations that are not significant are commonly marked abnormal, but do not represent any medical problem for you.  Best regards, Buster Schueller, M.D.

## 2021-09-28 ENCOUNTER — Ambulatory Visit (INDEPENDENT_AMBULATORY_CARE_PROVIDER_SITE_OTHER): Payer: Medicare HMO | Admitting: Licensed Clinical Social Worker

## 2021-09-28 DIAGNOSIS — E1159 Type 2 diabetes mellitus with other circulatory complications: Secondary | ICD-10-CM

## 2021-09-28 DIAGNOSIS — Z8673 Personal history of transient ischemic attack (TIA), and cerebral infarction without residual deficits: Secondary | ICD-10-CM

## 2021-09-28 DIAGNOSIS — E1169 Type 2 diabetes mellitus with other specified complication: Secondary | ICD-10-CM

## 2021-09-28 DIAGNOSIS — J45909 Unspecified asthma, uncomplicated: Secondary | ICD-10-CM

## 2021-09-28 DIAGNOSIS — J449 Chronic obstructive pulmonary disease, unspecified: Secondary | ICD-10-CM

## 2021-09-28 NOTE — Patient Instructions (Addendum)
Visit Information ? ?Patient Goals:  Manage Emotions. Manage mood issues. Complete ADLs as needed ? ?Timeframe:  Short-Term Goal ?Priority:  Medium ?Progress: On Track ?Start Date:          09/28/21                  ?Expected End Date:        12/25/21           ? ?Follow Up Date 11/22/21 at 10:00 AM  ?  ?Manage Emotions; Manage mood issues; Complete ADLs as needed  ?  ?Why is this important?   ?When you are stressed, down or upset, your body reacts too.  ?For example, your blood pressure may get higher; you may have a headache or stomachache.  ?When your emotions get the best of you, your body's ability to fight off cold and flu gets weak.  ?These steps will help you manage your emotions.   ?  ?Patient Strengths/Coping Skills: ?Has strong support from spouse, Shemar Plemmons ?Completes most ADLs on his own ?Allows time to rest and relax ? ?Patient Deficits: ? ?Memory issues ?Some walking challenges ?Some mood issues ? ?Patient Goals: In next 30 days, patient will: ? ?Attend all scheduled medical appointments ?Communicate regularly with spouse, [name], to discuss client needs ?Take medications as prescribed ?Allow time to rest and practice self care activities ?-  ?Follow Up Plan: LCSW to call client or spouse of client on 11/22/21 at 10:00 AM to assess client needs ? ?[name].Lasaro Primm MSW, LCSW ?Licensed Clinical Social Worker ?[facility] ?[phone] ?

## 2021-09-28 NOTE — Chronic Care Management (AMB) (Signed)
?Chronic Care Management  ? ? Clinical Social Work Note ? ?09/28/2021 ?Name: [name] MRN: [id number] DOB: [birth year] ? ?[name] is a 86 y.o. year old male who is a primary care patient of [name], Kenneth Gash, MD. The CCM team was consulted to assist the patient with chronic disease management and/or care coordination needs related to: [name] .  ? ?Engaged with patient / spouse of patient, [name], by telephone for follow up visit in response to provider referral for social work chronic care management and care coordination services.  ? ?Consent to Services:  ?The patient was given information about Chronic Care Management services, agreed to services, and gave verbal consent prior to initiation of services.  Please see initial visit note for detailed documentation.  ? ?Patient agreed to services and consent obtained.  ? ?Assessment: Review of patient past medical history, allergies, medications, and health status, including review of relevant consultants reports was performed today as part of a comprehensive evaluation and provision of chronic care management and care coordination services.    ? ?SDOH (Social Determinants of Health) assessments and interventions performed:  ?SDOH Interventions   ? ?Flowsheet Row Most Recent Value  ?SDOH Interventions   ?Physical Activity Interventions Other (Comments)  [walking challenges. he fatigues when he walks longer distances]  ?Stress Interventions Other (Comment)  [client has stress related to managing medical needs]  ?Depression Interventions/Treatment  Medication  ? ?  ?  ? ?Advanced Directives Status: See [employer]yn[employer] application for related entries. ? ?CCM Care Plan ? ?Allergies  ?Allergen Reactions  ? Ace Inhibitors Swelling  ? ? ?Outpatient Encounter Medications as of 09/28/2021  ?Medication Sig  ? Accu-Chek Softclix Lancets lancets Check glucose twice a day Dx E11.9  ? albuterol (VENTOLIN HFA) 108 (90 Base) MCG/ACT inhaler Inhale 2 puffs into the lungs  every 4 (four) hours as needed for wheezing or shortness of breath.  ? Alcohol Swabs (B-D SINGLE USE SWABS REGULAR) PADS Check glucose twice a day Dx E11.9  ? aspirin EC 81 MG tablet Take 162 mg by mouth daily.   ? atorvastatin (LIPITOR) 40 MG tablet Take 1 tablet (40 mg total) by mouth daily.  ? Blood Glucose Calibration (ACCU-CHEK AVIVA) SOLN Use with glucometer Dx E11.9  ? Blood Glucose Monitoring Suppl (ACCU-CHEK GUIDE) w/Device KIT Check glucose twice a day Dx E11.9  ? diclofenac Sodium (VOLTAREN) 1 % GEL Apply 2 g topically 4 (four) times daily.  ? donepezil ([name]) 23 MG TABS tablet Take 1 tablet (23 mg total) by mouth at bedtime.  ? fluticasone furoate-vilanterol (BREO ELLIPTA) 100-25 MCG/INH AEPB Inhale 1 puff into the lungs daily.  ? glucose blood (ACCU-CHEK GUIDE) test strip Check glucose twice a day Dx E11.9  ? Insulin Pen Needle (PEN NEEDLES) 32G X 4 MM MISC 1 each by Does not apply route 2 (two) times daily.  ? ipratropium-albuterol (DUONEB) 0.5-2.5 (3) MG/3ML SOLN Take 3 mLs by nebulization every 4 (four) hours as needed.  ? losartan (COZAAR) 50 MG tablet Take 1 tablet (50 mg total) by mouth daily.  ? memantine (NAMENDA) 10 MG tablet Take 1 tablet (10 mg total) by mouth 2 (two) times daily.  ? QUEtiapine (SEROQUEL) 25 MG tablet Take 1 tablet (25 mg total) by mouth at bedtime.  ? sildenafil (REVATIO) 20 MG tablet Take 2-5 pills at once, orally, with each sexual encounter  ? TRESIBA FLEXTOUCH 100 UNIT/ML FlexTouch Pen Inject 0.2 mLs (20 Units total) into the skin daily at 10  pm.  ? ?No facility-administered encounter medications on file as of 09/28/2021.  ? ? ?Patient Active Problem List  ? Diagnosis Date Noted  ? Asthma   ? COPD (chronic obstructive pulmonary disease) ([place])   ? Hyperlipidemia associated with type 2 diabetes mellitus ([place]) 11/27/2018  ? Intrinsic asthma 03/31/2015  ? Dementia without behavioral disturbance ([place])   ? Atelectasis of right lung 01/29/2014  ? Pleural effusion on right  11/24/2013  ? Late effect of cerebrovascular accident (CVA)   ? Diabetes mellitus ([place])   ? Hypertension associated with type 2 diabetes mellitus ([place])   ? Hx-TIA (transient ischemic attack)   ? ? ?Conditions to be addressed/monitored: Monitor client completion of ADLs, as he is able ? ?Care Plan : LCSW Care Plan  ?Updates made by Katha Cabal, LCSW since 09/28/2021 12:00 AM  ?  ? ?Problem: Coping Skills (General Plan of Care)   ?  ? ?Goal: Coping Skills Enhanced;complete ADLs as needed; manage mood issues faced   ?Start Date: 09/28/2021  ?Expected End Date: 12/25/2021  ?This Visit's Progress: On track  ?Recent Progress: On track  ?Priority: Medium  ?Note:   ?Current barriers:   ?Patient in need of assistance with connecting to community resources for possible help with completion of daily ADLs ?Patient is unable to independently navigate community resource options without care coordination support ?Mood issues ?Walking challenges (uses a cane to help him walk) ?Memory issues (forgetful) ? ?Clinical Goals:  ?Client or spouse of client will communicate with LCSW in next 30 days to discuss client completion of ADLs ?Client or spouse of client to communicate with LCSW in next 30 days to discuss client mood and management of mood issues ?Client or spouse of client to talk with SW in next 30 days about mobility issues of client ?Client or spouse of client to talk with RNCM in next 30 days to discuss nursing needs of client ? ?Clinical Interventions:  ?Collaboration with Kenneth Fraise, MD regarding development and update of comprehensive plan of care as evidenced by provider attestation and co-signature ?Discussed client needs with [name], spouse of client ?Reviewed with [name] the ambulation challenges of client. She said client uses a cane to help him walk.  ?Discussed with [name] the appetite of client. She said  that sometimes client has a reduced appetite ?Discussed family support for client with [name]. She  said son is supportive, daughter is supportive; she said grandson is also helpful and supportive. She said that grandson helps around the home and is supportive.  Talked with [name] about allowing family support in order for [name] to take some rest breaks. She said that family members have been helping with client care more and that this is very helpful to her ?Encouraged [name] to call [facility] as needed to discuss nursing needs of client ?Discussed mood of client. [name] said that she thinks mood of client has improved slightly.  She thinks that new medication prescribed for client is helping. She said he is not wandering quite as much, is sleeping a little better, and is more calm with his mood. ?Reviewed pain issues of client. She said Bradden sometimes has pain when he walks longer distances.  ?Discussed client completion of ADLs. She said she helps client with ADLs completion. ?Discussed client relaxation techniques (client enjoys sitting on porch; client enjoying being outdoors) ? ?Patient Strengths/Coping Skills: ?Has strong support from spouse, Bain Whichard ?Completes most ADLs on his own ?Allows time to rest and relax ? ?Patient  Deficits: ? ?Memory issues ?Some walking challenges ?Some mood issues ? ?Patient Goals: In next 30 days, patient will: ? ?Attend all scheduled medical appointments ?Communicate regularly with spouse, [name], to discuss client needs ?Take medications as prescribed ?Allow time to rest and practice self care activities ?-  ?Follow Up Plan: LCSW to call client or spouse of client on 11/22/21 at 10:00 AM to discuss needs of client  ?  ?  ?[name]. MSW, LCSW ?Licensed Clinical Social Worker ?[facility] ?[phone] ? ?

## 2021-10-05 ENCOUNTER — Telehealth: Payer: Self-pay | Admitting: Family Medicine

## 2021-10-05 MED ORDER — ACCU-CHEK GUIDE VI STRP: ORAL_STRIP | 3 refills | Status: DC

## 2021-10-05 NOTE — Telephone Encounter (Signed)
Aware refill sent to [facility] 

## 2021-10-05 NOTE — Telephone Encounter (Signed)
?  Prescription Request ? ?10/05/2021 ? ?Is this a "Controlled Substance" medicine? glucose blood (ACCU-CHEK GUIDE) test strip ? ?Have you seen your PCP in the last 2 weeks? 02.23.2023 ? ?If YES, route message to pool  -  If NO, patient needs to be scheduled for appointment. ? ?What is the name of the medication or equipment? glucose blood (ACCU-CHEK GUIDE) test strip ? ?Have you contacted your pharmacy to request a refill? no  ? ?Which pharmacy would you like this sent to? glucose blood (ACCU-CHEK GUIDE) test strip send with DX CODE ? ? ?Patient notified that their request is being sent to the clinical staff for review and that they should receive a response within 2 business days.  ? ? ?

## 2021-10-09 ENCOUNTER — Encounter: Payer: Self-pay | Admitting: Family Medicine

## 2021-10-09 NOTE — Telephone Encounter (Signed)
Opened in error

## 2021-10-09 NOTE — Progress Notes (Signed)
No history of Naprosyn being prescribed. Wife aware. ?

## 2021-11-22 ENCOUNTER — Telehealth: Payer: Medicare HMO

## 2021-11-30 ENCOUNTER — Ambulatory Visit: Payer: Medicare HMO | Admitting: Family Medicine

## 2021-12-12 ENCOUNTER — Other Ambulatory Visit: Payer: Self-pay | Admitting: Family Medicine

## 2021-12-14 ENCOUNTER — Ambulatory Visit (INDEPENDENT_AMBULATORY_CARE_PROVIDER_SITE_OTHER): Payer: Medicare HMO | Admitting: Family Medicine

## 2021-12-14 ENCOUNTER — Encounter: Payer: Self-pay | Admitting: Family Medicine

## 2021-12-14 VITALS — BP 123/71 | HR 81 | Temp 97.3°F | Ht 72.0 in | Wt 168.4 lb

## 2021-12-14 DIAGNOSIS — E1169 Type 2 diabetes mellitus with other specified complication: Secondary | ICD-10-CM | POA: Diagnosis not present

## 2021-12-14 DIAGNOSIS — Z794 Long term (current) use of insulin: Secondary | ICD-10-CM

## 2021-12-14 DIAGNOSIS — I152 Hypertension secondary to endocrine disorders: Secondary | ICD-10-CM

## 2021-12-14 DIAGNOSIS — E785 Hyperlipidemia, unspecified: Secondary | ICD-10-CM

## 2021-12-14 DIAGNOSIS — Z23 Encounter for immunization: Secondary | ICD-10-CM

## 2021-12-14 DIAGNOSIS — E1129 Type 2 diabetes mellitus with other diabetic kidney complication: Secondary | ICD-10-CM | POA: Diagnosis not present

## 2021-12-14 DIAGNOSIS — F0150 Vascular dementia without behavioral disturbance: Secondary | ICD-10-CM

## 2021-12-14 DIAGNOSIS — R809 Proteinuria, unspecified: Secondary | ICD-10-CM | POA: Diagnosis not present

## 2021-12-14 DIAGNOSIS — N529 Male erectile dysfunction, unspecified: Secondary | ICD-10-CM

## 2021-12-14 DIAGNOSIS — E1159 Type 2 diabetes mellitus with other circulatory complications: Secondary | ICD-10-CM | POA: Diagnosis not present

## 2021-12-14 LAB — BAYER DCA HB A1C WAIVED: HB A1C (BAYER DCA - WAIVED): 5.8 % — ABNORMAL HIGH (ref 4.8–5.6)

## 2021-12-14 MED ORDER — SILDENAFIL CITRATE 20 MG PO TABS: ORAL_TABLET | ORAL | 5 refills | Status: DC

## 2021-12-14 MED ORDER — QUETIAPINE FUMARATE 25 MG PO TABS
25.0000 mg | ORAL_TABLET | Freq: Every day | ORAL | 1 refills | Status: DC
Start: 2021-12-14 — End: 2022-04-19

## 2021-12-14 MED ORDER — LOSARTAN POTASSIUM 50 MG PO TABS: 50.0000 mg | ORAL_TABLET | Freq: Every day | ORAL | 3 refills | Status: DC

## 2021-12-14 MED ORDER — ATORVASTATIN CALCIUM 40 MG PO TABS: 40.0000 mg | ORAL_TABLET | Freq: Every day | ORAL | 3 refills | Status: DC

## 2021-12-14 MED ORDER — MEMANTINE HCL 10 MG PO TABS: 10.0000 mg | ORAL_TABLET | Freq: Two times a day (BID) | ORAL | 3 refills | Status: DC

## 2021-12-14 NOTE — Addendum Note (Signed)
Addended by: Everlean Cherry on: 12/14/2021 11:28 AM   Modules accepted: Orders

## 2021-12-14 NOTE — Progress Notes (Signed)
Subjective:  Patient ID: [name],  male    DOB: 1935-08-03  Age: 86 y.o.    CC: Diabetes   HPI [name] presents for  follow-up of hypertension. Patient has no history of headache chest pain or shortness of breath or recent cough. Patient also denies symptoms of TIA such as numbness weakness lateralizing. Patient denies side effects from medication. States taking it regularly.  Patient also  in for follow-up of elevated cholesterol. Doing well without complaints on current medication. Denies side effects  including myalgia and arthralgia and nausea. Also in today for liver function testing. Currently no chest pain, shortness of breath or other cardiovascular related symptoms noted.  Follow-up of diabetes. Patient does NOT check blood sugar at home.Patient denies symptoms such as excessive hunger or urinary frequency, excessive hunger, nausea No significant hypoglycemic spells noted. Medications reviewed. Pt reports taking them regularly. Pt. denies complication/adverse reaction today.  Gets tired easily, but staying active.   Wants to try viagra.  History [name] has a past medical history of Anxiety, Asthma, Choledocholithiasis (02/24/2013), COPD (chronic obstructive pulmonary disease) ([place]), CVA (cerebral vascular accident) ([place]) (2013), Dementia ([place]), Diabetes mellitus, TIA (transient ischemic attack), Hyperlipidemia, and Hypertension.   He has a past surgical history that includes Rotator cuff repair (Right); Cholecystectomy (N/A, 02/23/2013); ERCP (N/A, 02/24/2013); and Cataract extraction w/PHACO (Left, 04/30/2016).   His family history includes Diabetes in his sister and sister; Emphysema in his brother; Heart disease in his brother; Hypertension in his father and mother.He reports that he has never smoked. He has never used smokeless tobacco. He reports that he does not drink alcohol and does not use drugs.  Current Outpatient Medications on File Prior to Visit   Medication Sig Dispense Refill   Accu-Chek Softclix Lancets lancets Check glucose twice a day Dx E11.9 200 each 3   albuterol (VENTOLIN HFA) 108 (90 Base) MCG/ACT inhaler Inhale 2 puffs into the lungs every 4 (four) hours as needed for wheezing or shortness of breath. 18 g 5   Alcohol Swabs (B-D SINGLE USE SWABS REGULAR) PADS Check glucose twice a day Dx E11.9 200 each 3   aspirin EC 81 MG tablet Take 162 mg by mouth daily.      Blood Glucose Calibration (ACCU-CHEK AVIVA) SOLN Use with glucometer Dx E11.9 3 each 0   Blood Glucose Monitoring Suppl (ACCU-CHEK GUIDE) w/Device KIT Check glucose twice a day Dx E11.9 1 kit 0   diclofenac Sodium (VOLTAREN) 1 % GEL Apply 2 g topically 4 (four) times daily. 350 g 1   fluticasone furoate-vilanterol (BREO ELLIPTA) 100-25 MCG/INH AEPB Inhale 1 puff into the lungs daily. 30 each 5   glucose blood (ACCU-CHEK GUIDE) test strip TEST BLOOD SUGAR TWICE DAILY Dx E11.9 200 strip 3   Insulin Pen Needle (PEN NEEDLES) 32G X 4 MM MISC 1 each by Does not apply route 2 (two) times daily. 90 each 3   ipratropium-albuterol (DUONEB) 0.5-2.5 (3) MG/3ML SOLN Take 3 mLs by nebulization every 4 (four) hours as needed. 360 mL 0   TRESIBA FLEXTOUCH 100 UNIT/ML FlexTouch Pen Inject 0.2 mLs (20 Units total) into the skin daily at 10 pm. 5 pen 3   donepezil ([name]) 23 MG TABS tablet Take 1 tablet (23 mg total) by mouth at bedtime. 90 tablet 1   No current facility-administered medications on file prior to visit.    ROS Review of Systems  Constitutional:  Negative for fever.  Respiratory:  Negative for shortness  of breath.   Cardiovascular:  Negative for chest pain.  Musculoskeletal:  Negative for arthralgias.  Skin:  Negative for rash.    Objective:  BP 123/71   Pulse 81   Temp (!) 97.3 F (36.3 C)   Ht 6' (1.829 m)   Wt 168 lb 6.4 oz (76.4 kg)   SpO2 99%   BMI 22.84 kg/m   BP Readings from Last 3 Encounters:  12/14/21 123/71  08/31/21 137/66  04/20/21 128/62     Wt Readings from Last 3 Encounters:  12/14/21 168 lb 6.4 oz (76.4 kg)  08/31/21 175 lb 12.8 oz (79.7 kg)  04/20/21 173 lb 12.8 oz (78.8 kg)     Physical Exam Vitals reviewed.  Constitutional:      Appearance: He is well-developed.  HENT:     Head: Normocephalic and atraumatic.     Right Ear: External ear normal.     Left Ear: External ear normal.     Mouth/Throat:     Pharynx: No oropharyngeal exudate or posterior oropharyngeal erythema.  Eyes:     Pupils: Pupils are equal, round, and reactive to light.  Cardiovascular:     Rate and Rhythm: Normal rate and regular rhythm.     Heart sounds: No murmur heard. Pulmonary:     Effort: No respiratory distress.     Breath sounds: Normal breath sounds.  Musculoskeletal:     Cervical back: Normal range of motion and neck supple.  Neurological:     Mental Status: He is alert and oriented to person, place, and time.     Diabetic Foot Exam - Simple   No data filed     Lab Results  Component Value Date   HGBA1C 5.9 (H) 08/31/2021   HGBA1C 5.9 (H) 04/20/2021   HGBA1C 6.5 01/12/2021    Assessment & Plan:   [name] was seen today for diabetes.  Diagnoses and all orders for this visit:  Hypertension associated with type 2 diabetes mellitus ([place]) -     CBC with Differential/Platelet -     CMP14+EGFR -     losartan (COZAAR) 50 MG tablet; Take 1 tablet (50 mg total) by mouth daily.  Hyperlipidemia associated with type 2 diabetes mellitus (HCC) -     Lipid panel -     atorvastatin (LIPITOR) 40 MG tablet; Take 1 tablet (40 mg total) by mouth daily.  Type 2 diabetes mellitus with microalbuminuria, with long-term current use of insulin (HCC) -     Bayer DCA Hb A1c Waived  Vascular dementia without behavioral disturbance (HCC) -     memantine (NAMENDA) 10 MG tablet; Take 1 tablet (10 mg total) by mouth 2 (two) times daily.  Vasculogenic erectile dysfunction, unspecified vasculogenic erectile dysfunction type -      sildenafil (REVATIO) 20 MG tablet; Take 2-5 pills at once, orally, with each sexual encounter  Other orders -     QUEtiapine (SEROQUEL) 25 MG tablet; Take 1 tablet (25 mg total) by mouth at bedtime.   I am having Kentrel L. Rogerson maintain his aspirin EC, Pen Needles, diclofenac Sodium, Tyler Aas FlexTouch, albuterol, Breo Ellipta, ipratropium-albuterol, Accu-Chek Aviva, Accu-Chek Softclix Lancets, B-D SINGLE USE SWABS REGULAR, Accu-Chek Guide, donepezil, Accu-Chek Guide, atorvastatin, losartan, memantine, QUEtiapine, and sildenafil.  Meds ordered this encounter  Medications   atorvastatin (LIPITOR) 40 MG tablet    Sig: Take 1 tablet (40 mg total) by mouth daily.    Dispense:  90 tablet    Refill:  3  losartan (COZAAR) 50 MG tablet    Sig: Take 1 tablet (50 mg total) by mouth daily.    Dispense:  90 tablet    Refill:  3   memantine (NAMENDA) 10 MG tablet    Sig: Take 1 tablet (10 mg total) by mouth 2 (two) times daily.    Dispense:  180 tablet    Refill:  3   QUEtiapine (SEROQUEL) 25 MG tablet    Sig: Take 1 tablet (25 mg total) by mouth at bedtime.    Dispense:  90 tablet    Refill:  1   sildenafil (REVATIO) 20 MG tablet    Sig: Take 2-5 pills at once, orally, with each sexual encounter    Dispense:  50 tablet    Refill:  5     Follow-up: Return in about 3 months (around 03/16/2022).  Claretta Fraise, M.D.

## 2021-12-15 LAB — CMP14+EGFR
ALT: 13 IU/L (ref 0–44)
AST: 18 IU/L (ref 0–40)
Albumin/Globulin Ratio: 1.4 (ref 1.2–2.2)
Albumin: 4.2 g/dL (ref 3.6–4.6)
Alkaline Phosphatase: 114 IU/L (ref 44–121)
BUN/Creatinine Ratio: 10 (ref 10–24)
BUN: 16 mg/dL (ref 8–27)
Bilirubin Total: 0.9 mg/dL (ref 0.0–1.2)
CO2: 23 mmol/L (ref 20–29)
Calcium: 8.8 mg/dL (ref 8.6–10.2)
Chloride: 106 mmol/L (ref 96–106)
Creatinine, Ser: 1.54 mg/dL — ABNORMAL HIGH (ref 0.76–1.27)
Globulin, Total: 2.9 g/dL (ref 1.5–4.5)
Glucose: 160 mg/dL — ABNORMAL HIGH (ref 70–99)
Potassium: 3.8 mmol/L (ref 3.5–5.2)
Sodium: 141 mmol/L (ref 134–144)
Total Protein: 7.1 g/dL (ref 6.0–8.5)
eGFR: 44 mL/min/{1.73_m2} — ABNORMAL LOW (ref >59)

## 2021-12-15 LAB — LIPID PANEL
Chol/HDL Ratio: 1.9 ratio (ref 0.0–5.0)
Cholesterol, Total: 70 mg/dL — ABNORMAL LOW (ref 100–199)
HDL: 37 mg/dL — ABNORMAL LOW (ref >39)
LDL Chol Calc (NIH): 16 mg/dL (ref 0–99)
Triglycerides: 82 mg/dL (ref 0–149)
VLDL Cholesterol Cal: 17 mg/dL (ref 5–40)

## 2021-12-15 LAB — CBC WITH DIFFERENTIAL/PLATELET
Basophils Absolute: 0.1 10*3/uL (ref 0.0–0.2)
Basos: 1 %
EOS (ABSOLUTE): 0.3 10*3/uL (ref 0.0–0.4)
Eos: 4 %
Hematocrit: 35.1 % — ABNORMAL LOW (ref 37.5–51.0)
Hemoglobin: 12.5 g/dL — ABNORMAL LOW (ref 13.0–17.7)
Immature Grans (Abs): 0 10*3/uL (ref 0.0–0.1)
Immature Granulocytes: 0 %
Lymphocytes Absolute: 1.5 10*3/uL (ref 0.7–3.1)
Lymphs: 19 %
MCH: 31.3 pg (ref 26.6–33.0)
MCHC: 35.6 g/dL (ref 31.5–35.7)
MCV: 88 fL (ref 79–97)
Monocytes Absolute: 0.5 10*3/uL (ref 0.1–0.9)
Monocytes: 7 %
Neutrophils Absolute: 5.2 10*3/uL (ref 1.4–7.0)
Neutrophils: 69 %
Platelets: 197 10*3/uL (ref 150–450)
RBC: 4 x10E6/uL — ABNORMAL LOW (ref 4.14–5.80)
RDW: 12.4 % (ref 11.6–15.4)
WBC: 7.6 10*3/uL (ref 3.4–10.8)

## 2021-12-18 NOTE — Progress Notes (Signed)
Hello [name],  Your lab result is normal and/or stable.Some minor variations that are not significant are commonly marked abnormal, but do not represent any medical problem for you.  Best regards, Sophia Sperry, M.D.

## 2021-12-19 ENCOUNTER — Telehealth: Payer: Self-pay | Admitting: *Deleted

## 2021-12-19 NOTE — Telephone Encounter (Signed)
Kenneth First [name]  [place] Case ID: [id number] Rx #: [id number]  Status: Sent to Plan  Drug: QUEtiapine Fumarate '25MG'$  tablets

## 2021-12-20 NOTE — Telephone Encounter (Signed)
Approved until 07/08/2022 ?

## 2022-01-01 ENCOUNTER — Ambulatory Visit (INDEPENDENT_AMBULATORY_CARE_PROVIDER_SITE_OTHER): Payer: Medicare PPO

## 2022-01-01 ENCOUNTER — Other Ambulatory Visit: Payer: Self-pay | Admitting: Family Medicine

## 2022-01-01 VITALS — Wt 168.0 lb

## 2022-01-01 DIAGNOSIS — Z0000 Encounter for general adult medical examination without abnormal findings: Secondary | ICD-10-CM | POA: Diagnosis not present

## 2022-01-01 NOTE — Progress Notes (Signed)
Subjective:   [name] is a 86 y.o. male who presents for Medicare Annual/Subsequent preventive examination.  Virtual Visit via Telephone Note  I connected with  [name] on 01/01/22 at 10:30 AM EDT by telephone and verified that I am speaking with the correct person using two identifiers.  Location: Patient: Home Provider: [facility] Persons participating in the virtual visit: patient/Nurse Health Advisor   I discussed the limitations, risks, security and privacy concerns of performing an evaluation and management service by telephone and the availability of in person appointments. The patient expressed understanding and agreed to proceed.  Interactive audio and video telecommunications were attempted between this nurse and patient, however failed, due to patient having technical difficulties OR patient did not have access to video capability.  We continued and completed visit with audio only.  Some vital signs may be absent or patient reported.   Mithra Spano E Ramonita Koenig, LPN   Review of Systems     Cardiac Risk Factors include: advanced age (>34men, >38 women);sedentary lifestyle;male gender;hypertension;diabetes mellitus;dyslipidemia;Other (see comment), Risk factor comments: COPD, hx of stroke     Objective:    Today's Vitals   01/01/22 1036  Weight: 168 lb (76.2 kg)   Body mass index is 22.78 kg/m.     01/01/2022   10:46 AM 12/29/2020   11:29 AM 12/24/2020    7:10 PM 04/29/2020   10:07 AM 12/29/2019   10:16 AM 12/26/2018   10:36 AM 11/10/2018   10:35 AM  Advanced Directives  Does Patient Have a Medical Advance Directive? No No No No No No No  Would patient like information on creating a medical advance directive? No - Patient declined No - Patient declined   No - Patient declined Yes (MAU/Ambulatory/Procedural Areas - Information given) No - Patient declined    Current Medications (verified) Outpatient Encounter Medications as of 01/01/2022  Medication Sig   Accu-Chek  Softclix Lancets lancets Check glucose twice a day Dx E11.9   Alcohol Swabs (B-D SINGLE USE SWABS REGULAR) PADS Check glucose twice a day Dx E11.9   aspirin EC 81 MG tablet Take 162 mg by mouth daily.    atorvastatin (LIPITOR) 40 MG tablet Take 1 tablet (40 mg total) by mouth daily.   Blood Glucose Calibration (ACCU-CHEK AVIVA) SOLN Use with glucometer Dx E11.9   Blood Glucose Monitoring Suppl (ACCU-CHEK GUIDE) w/Device KIT Check glucose twice a day Dx E11.9   diclofenac Sodium (VOLTAREN) 1 % GEL Apply 2 g topically 4 (four) times daily.   glucose blood (ACCU-CHEK GUIDE) test strip TEST BLOOD SUGAR TWICE DAILY Dx E11.9   Insulin Pen Needle (PEN NEEDLES) 32G X 4 MM MISC 1 each by Does not apply route 2 (two) times daily.   ipratropium-albuterol (DUONEB) 0.5-2.5 (3) MG/3ML SOLN Take 3 mLs by nebulization every 4 (four) hours as needed.   losartan (COZAAR) 50 MG tablet Take 1 tablet (50 mg total) by mouth daily.   memantine (NAMENDA) 10 MG tablet Take 1 tablet (10 mg total) by mouth 2 (two) times daily.   QUEtiapine (SEROQUEL) 25 MG tablet Take 1 tablet (25 mg total) by mouth at bedtime.   TRESIBA FLEXTOUCH 100 UNIT/ML FlexTouch Pen Inject 0.2 mLs (20 Units total) into the skin daily at 10 pm.   albuterol (VENTOLIN HFA) 108 (90 Base) MCG/ACT inhaler Inhale 2 puffs into the lungs every 4 (four) hours as needed for wheezing or shortness of breath. (Patient not taking: Reported on 01/01/2022)   donepezil ([name])  23 MG TABS tablet Take 1 tablet (23 mg total) by mouth at bedtime.   fluticasone furoate-vilanterol (BREO ELLIPTA) 100-25 MCG/INH AEPB Inhale 1 puff into the lungs daily. (Patient not taking: Reported on 01/01/2022)   sildenafil (REVATIO) 20 MG tablet Take 2-5 pills at once, orally, with each sexual encounter (Patient not taking: Reported on 01/01/2022)   No facility-administered encounter medications on file as of 01/01/2022.    Allergies (verified) Ace inhibitors   History: Past Medical  History:  Diagnosis Date   Anxiety    Asthma    Choledocholithiasis 02/24/2013   COPD (chronic obstructive pulmonary disease) (HCC)    CVA (cerebral vascular accident) (HCC) 2013   loss of memory   Dementia (HCC)    Diabetes mellitus    Hx-TIA (transient ischemic attack)    Hyperlipidemia    Hypertension    Past Surgical History:  Procedure Laterality Date   CATARACT EXTRACTION W/PHACO Left 04/30/2016   Procedure: CATARACT EXTRACTION PHACO AND INTRAOCULAR LENS PLACEMENT LEFT EYE CDE=9.83;  Surgeon: Gemma Payor, MD;  Location: [facility];  Service: Ophthalmology;  Laterality: Left;  left   CHOLECYSTECTOMY N/A 02/23/2013   Procedure: LAPAROSCOPIC CHOLECYSTECTOMY;  Surgeon: Dalia Heading, MD;  Location: [facility];  Service: General;  Laterality: N/A;   ERCP N/A 02/24/2013   Procedure: ENDOSCOPIC RETROGRADE CHOLANGIOPANCREATOGRAPHY;  Surgeon: Corbin Ade, MD;  Location: [facility];  Service: Endoscopy;  Laterality: N/A;   ROTATOR CUFF REPAIR Right    Family History  Problem Relation Age of Onset   Hypertension Mother    Hypertension Father    Diabetes Sister    Diabetes Sister    Heart disease Brother    Emphysema Brother    Colon cancer Neg Hx    Social History   Socioeconomic History   Marital status: Married    Spouse name: [name]   Number of children: 6   Years of education: 11th   Highest education level: 11th grade  Occupational History   Occupation: Retired    Comment: security - [employer]   Tobacco Use   Smoking status: Never   Smokeless tobacco: Never  Building services engineer Use: Never used  Substance and Sexual Activity   Alcohol use: No   Drug use: No   Sexual activity: Not Currently  Other Topics Concern   Not on file  Social History Narrative   Patient lives at home with wife, [name] - Grandson lives with them right  now and son lives across the street.   Caffeine Use: 1 cup of coffee and 2 can pepsi   Since his stroke, memory has declined and he has poor  fine motor skills   Social Determinants of Health   Financial Resource Strain: Low Risk  (01/01/2022)   Overall Financial Resource Strain (CARDIA)    Difficulty of Paying Living Expenses: Not hard at all  Food Insecurity: No Food Insecurity (01/01/2022)   Hunger Vital Sign    Worried About Running Out of Food in the Last Year: Never true    Ran Out of Food in the Last Year: Never true  Transportation Needs: No Transportation Needs (01/01/2022)   PRAPARE - Administrator, Civil Service (Medical): No    Lack of Transportation (Non-Medical): No  Physical Activity: Insufficiently Active (01/01/2022)   Exercise Vital Sign    Days of Exercise per Week: 7 days    Minutes of Exercise per Session: 10 min  Stress: No Stress Concern  Present (01/01/2022)   [employer] of Occupational Health - Occupational Stress Questionnaire    Feeling of Stress : Only a little  Social Connections: Socially Integrated (01/01/2022)   Social Connection and Isolation Panel [NHANES]    Frequency of Communication with Friends and Family: More than three times a week    Frequency of Social Gatherings with Friends and Family: More than three times a week    Attends Religious Services: More than 4 times per year    Active Member of [employer] or Organizations: Yes    Attends Engineer, structural: More than 4 times per year    Marital Status: Married    Tobacco Counseling Counseling given: Not Answered   Clinical Intake:  Pre-visit preparation completed: Yes  Pain : No/denies pain     BMI - recorded: 22.78 Nutritional Status: BMI of 19-24  Normal Nutritional Risks: None Diabetes: Yes CBG done?: No Did pt. bring in CBG monitor from home?: No  How often do you need to have someone help you when you read instructions, pamphlets, or other written materials from your doctor or pharmacy?: 1 - Never  Diabetic? Nutrition Risk Assessment:  Has the patient had any N/V/D within the last 2  months?  No  Does the patient have any non-healing wounds?  No  Has the patient had any unintentional weight loss or weight gain?  No   Diabetes:  Is the patient diabetic?  Yes  If diabetic, was a CBG obtained today?  No  Did the patient bring in their glucometer from home?  No  How often do you monitor your CBG's? 3x per week.   Financial Strains and Diabetes Management:  Are you having any financial strains with the device, your supplies or your medication? No .  Does the patient want to be seen by Chronic Care Management for management of their diabetes?  No  Would the patient like to be referred to a Nutritionist or for Diabetic Management?  No   Diabetic Exams:  Diabetic Eye Exam: Completed 2022. Overdue for diabetic eye exam. Pt has been advised about the importance in completing this exam.   Diabetic Foot Exam: Completed 04/20/2021. Pt has been advised about the importance in completing this exam.  Interpreter Needed?: No  Information entered by :: Shaquel Josephson, LPN   Activities of Daily Living    01/01/2022   10:44 AM  In your present state of health, do you have any difficulty performing the following activities:  Hearing? 0  Vision? 0  Difficulty concentrating or making decisions? 1  Walking or climbing stairs? 1  Dressing or bathing? 1  Comment wife has to help with getting dressed  Doing errands, shopping? 1  Preparing Food and eating ? N  Using the Toilet? N  In the past six months, have you accidently leaked urine? N  Do you have problems with loss of bowel control? N  Managing your Medications? N  Managing your Finances? N  Housekeeping or managing your Housekeeping? Y    Patient Care Team: Mechele Claude, MD as PCP - General (Family Medicine) Randa Spike, Kelton Pillar, LCSW as Social Worker (Licensed Clinical Social Worker) Lillia Mountain, Layla Maw, RN as Case Manager Byrum, Les Pou, MD as Consulting Physician (Pulmonary Disease) Dione Booze, Bertram Millard, MD as  Consulting Physician (Ophthalmology)  Indicate any recent Medical Services you may have received from other than [facility] providers in the past year (date may be approximate).     Assessment:   This  is a routine wellness examination for Angus.  Hearing/Vision screen Hearing Screening - Comments:: Denies hearing difficulties   Vision Screening - Comments:: Wears rx glasses - up to date with routine eye exams with [facility]  Dietary issues and exercise activities discussed: Current Exercise Habits: Home exercise routine, Type of exercise: walking, Time (Minutes): 10, Frequency (Times/Week): 7, Weekly Exercise (Minutes/Week): 70, Intensity: Mild, Exercise limited by: neurologic condition(s);respiratory conditions(s);psychological condition(s)   Goals Addressed             This Visit's Progress    Prevent falls   On track    01/01/2022 -Try to get more active       Depression Screen    01/01/2022   10:41 AM 09/28/2021    3:53 PM 08/31/2021   11:01 AM 08/31/2021   10:58 AM 08/08/2021    3:50 PM 06/08/2021   12:51 PM 04/18/2021    3:22 PM  PHQ 2/9 Scores  PHQ - 2 Score 0 2 0 0 2 2 2   PHQ- 9 Score 4 9 7  11 8 9     Fall Risk    01/01/2022   10:38 AM 08/31/2021   10:58 AM 04/20/2021    1:51 PM 01/12/2021   10:14 AM 12/29/2020   11:35 AM  Fall Risk   Falls in the past year? 0 0 0 0 0  Number falls in past yr: 0    0  Injury with Fall? 0    0  Risk for fall due to : Impaired balance/gait;Orthopedic patient    Mental status change  Follow up Falls prevention discussed    Falls prevention discussed    FALL RISK PREVENTION PERTAINING TO THE HOME:  Any stairs in or around the home? No  If so, are there any without handrails? No  Home free of loose throw rugs in walkways, pet beds, electrical cords, etc? Yes  Adequate lighting in your home to reduce risk of falls? Yes   ASSISTIVE DEVICES UTILIZED TO PREVENT FALLS:  Life alert? No  Use of a cane, walker or w/c? Yes  Grab  bars in the bathroom? No  Shower chair or bench in shower? Yes  Elevated toilet seat or a handicapped toilet? Yes   TIMED UP AND GO:  Was the test performed? No . Telephonic visit  Cognitive Function:    10/10/2017    4:38 PM 09/06/2016    1:38 PM  MMSE - Mini Mental State Exam  Orientation to time 5 1  Orientation to Place 4 4  Registration 3 3  Attention/ Calculation 5 5  Recall 2 1  Language- name 2 objects 2 2  Language- repeat 1 1  Language- follow 3 step command 1 3  Language- read & follow direction 1 1  Write a sentence 1 1  Copy design 0 0  Total score 25 22        01/01/2022   10:42 AM 12/29/2019   10:18 AM 12/26/2018   10:55 AM  6CIT Screen  What Year? 4 points 0 points 4 points  What month? 3 points 0 points 3 points  What time? 0 points 0 points 3 points  Count back from 20 0 points 2 points 4 points  Months in reverse 4 points 0 points 4 points  Repeat phrase 10 points 10 points 6 points  Total Score 21 points 12 points 24 points    Immunizations Immunization History  Administered Date(s) Administered   Fluad Quad(high Dose 65+)  04/16/2019, 04/12/2020, 04/20/2021   Influenza Split 03/26/2012   Influenza, High Dose Seasonal PF 03/28/2016, 04/08/2017, 04/09/2018   Influenza,inj,Quad PF,6+ Mos 04/21/2014   Influenza-Unspecified 05/10/2015   Moderna Sars-Covid-2 Vaccination 09/17/2019, 10/17/2019   Pneumococcal Conjugate-13 08/18/2015   Pneumococcal Polysaccharide-23 06/08/2014   Td 07/04/2016   Zoster Recombinat (Shingrix) 12/14/2021    TDAP status: Up to date  Flu Vaccine status: Up to date  Pneumococcal vaccine status: Up to date  Covid-19 vaccine status: Completed vaccines  Qualifies for Shingles Vaccine? Yes   Zostavax completed Yes   Shingrix Completed?: No.    Education has been provided regarding the importance of this vaccine. Patient has been advised to call insurance company to determine out of pocket expense if they have not yet  received this vaccine. Advised may also receive vaccine at local pharmacy or Health Dept. Verbalized acceptance and understanding.  Screening Tests Health Maintenance  Topic Date Due   OPHTHALMOLOGY EXAM  02/02/2017   COVID-19 Vaccine (3 - Moderna series) 12/12/2019   INFLUENZA VACCINE  02/06/2022   Zoster Vaccines- Shingrix (2 of 2) 02/08/2022   FOOT EXAM  04/20/2022   HEMOGLOBIN A1C  06/15/2022   TETANUS/TDAP  07/04/2026   Pneumonia Vaccine 2+ Years old  Completed   HPV VACCINES  Aged Out    Health Maintenance  Health Maintenance Due  Topic Date Due   OPHTHALMOLOGY EXAM  02/02/2017   COVID-19 Vaccine (3 - Moderna series) 12/12/2019    Colorectal cancer screening: No longer required.   Lung Cancer Screening: (Low Dose CT Chest recommended if Age 51-80 years, 30 pack-year currently smoking OR have quit w/in 15years.) does not qualify.   Additional Screening:  Hepatitis C Screening: does not qualify  Vision Screening: Recommended annual ophthalmology exams for early detection of glaucoma and other disorders of the eye. Is the patient up to date with their annual eye exam?  Yes  Who is the provider or what is the name of the office in which the patient attends annual eye exams? [facility] If pt is not established with a provider, would they like to be referred to a provider to establish care? No .   Dental Screening: Recommended annual dental exams for proper oral hygiene  Community Resource Referral / Chronic Care Management: CRR required this visit?  No   CCM required this visit?  No      Plan:     I have personally reviewed and noted the following in the patient's chart:   Medical and social history Use of alcohol, tobacco or illicit drugs  Current medications and supplements including opioid prescriptions. Patient is not currently taking opioid prescriptions. Functional ability and status Nutritional status Physical activity Advanced directives List  of other physicians Hospitalizations, surgeries, and ER visits in previous 12 months Vitals Screenings to include cognitive, depression, and falls Referrals and appointments  In addition, I have reviewed and discussed with patient certain preventive protocols, quality metrics, and best practice recommendations. A written personalized care plan for preventive services as well as general preventive health recommendations were provided to patient.     [place] Constable, LPN   [id number]   Nurse Notes: None

## 2022-01-08 NOTE — Chronic Care Management (AMB) (Signed)
Erroneous encounter. Please disregard.

## 2022-03-20 ENCOUNTER — Ambulatory Visit: Payer: Medicare HMO | Admitting: Family Medicine

## 2022-03-20 ENCOUNTER — Encounter: Payer: Self-pay | Admitting: Family Medicine

## 2022-04-19 ENCOUNTER — Ambulatory Visit (INDEPENDENT_AMBULATORY_CARE_PROVIDER_SITE_OTHER): Payer: Medicare PPO | Admitting: Family Medicine

## 2022-04-19 ENCOUNTER — Encounter: Payer: Self-pay | Admitting: Family Medicine

## 2022-04-19 VITALS — BP 161/69 | HR 64 | Temp 97.6°F | Ht 72.0 in | Wt 166.8 lb

## 2022-04-19 DIAGNOSIS — I152 Hypertension secondary to endocrine disorders: Secondary | ICD-10-CM

## 2022-04-19 DIAGNOSIS — E1129 Type 2 diabetes mellitus with other diabetic kidney complication: Secondary | ICD-10-CM

## 2022-04-19 DIAGNOSIS — Z23 Encounter for immunization: Secondary | ICD-10-CM | POA: Diagnosis not present

## 2022-04-19 DIAGNOSIS — E1159 Type 2 diabetes mellitus with other circulatory complications: Secondary | ICD-10-CM | POA: Diagnosis not present

## 2022-04-19 DIAGNOSIS — E785 Hyperlipidemia, unspecified: Secondary | ICD-10-CM

## 2022-04-19 DIAGNOSIS — Z794 Long term (current) use of insulin: Secondary | ICD-10-CM | POA: Diagnosis not present

## 2022-04-19 DIAGNOSIS — E1169 Type 2 diabetes mellitus with other specified complication: Secondary | ICD-10-CM | POA: Diagnosis not present

## 2022-04-19 DIAGNOSIS — R809 Proteinuria, unspecified: Secondary | ICD-10-CM | POA: Diagnosis not present

## 2022-04-19 LAB — BAYER DCA HB A1C WAIVED: HB A1C (BAYER DCA - WAIVED): 5.6 % (ref 4.8–5.6)

## 2022-04-19 MED ORDER — QUETIAPINE FUMARATE 50 MG PO TABS: 50.0000 mg | ORAL_TABLET | Freq: Every day | ORAL | 1 refills | Status: DC

## 2022-04-19 NOTE — Progress Notes (Signed)
Subjective:  Patient ID: [name],  male    DOB: 1936-06-14  Age: 86 y.o.    CC: Medical Management of Chronic Issues   HPI [name] presents for  follow-up of hypertension. Patient has no history of headache chest pain or shortness of breath or recent cough. Patient also denies symptoms of TIA such as numbness weakness lateralizing. Patient denies side effects from medication. States taking it regularly. Ate country ham last night.   Patient also  in for follow-up of elevated cholesterol. Doing well without complaints on current medication. Denies side effects  including myalgia and arthralgia and nausea. Also in today for liver function testing. Currently no chest pain, shortness of breath or other cardiovascular related symptoms noted.  Follow-up of diabetes. Patient does check blood sugar at home. Readings run between 158 today. Likes to eat sweets. Stays under 200. Only checking about once a week. Patient denies symptoms such as excessive hunger or urinary frequency, excessive hunger, nausea No significant hypoglycemic spells noted. Medications reviewed. Pt reports taking them regularly. Pt. denies complication/adverse reaction today. Easily agitated. Occurring 2-3 times a week.   History [name] has a past medical history of Anxiety, Asthma, Choledocholithiasis (02/24/2013), COPD (chronic obstructive pulmonary disease) ([place]), CVA (cerebral vascular accident) ([place]) (2013), Dementia ([place]), Diabetes mellitus, TIA (transient ischemic attack), Hyperlipidemia, and Hypertension.   He has a past surgical history that includes Rotator cuff repair (Right); Cholecystectomy (N/A, 02/23/2013); ERCP (N/A, 02/24/2013); and Cataract extraction w/PHACO (Left, 04/30/2016).   His family history includes Diabetes in his sister and sister; Emphysema in his brother; Heart disease in his brother; Hypertension in his father and mother.He reports that he has never smoked. He has never used smokeless  tobacco. He reports that he does not drink alcohol and does not use drugs.  Current Outpatient Medications on File Prior to Visit  Medication Sig Dispense Refill   Accu-Chek Softclix Lancets lancets TEST BLOOD SUGAR TWICE DAILY Dx E11.9 200 each 3   albuterol (VENTOLIN HFA) 108 (90 Base) MCG/ACT inhaler Inhale 2 puffs into the lungs every 4 (four) hours as needed for wheezing or shortness of breath. 18 g 5   Alcohol Swabs (DROPSAFE ALCOHOL PREP) 70 % PADS CHECK GLUCOSE TWICE A DAY DX E11.9 200 each 3   aspirin EC 81 MG tablet Take 162 mg by mouth daily.      atorvastatin (LIPITOR) 40 MG tablet Take 1 tablet (40 mg total) by mouth daily. 90 tablet 3   Blood Glucose Calibration (ACCU-CHEK AVIVA) SOLN Use with glucometer Dx E11.9 3 each 0   Blood Glucose Monitoring Suppl (ACCU-CHEK GUIDE) w/Device KIT Check glucose twice a day Dx E11.9 1 kit 0   diclofenac Sodium (VOLTAREN) 1 % GEL Apply 2 g topically 4 (four) times daily. 350 g 1   fluticasone furoate-vilanterol (BREO ELLIPTA) 100-25 MCG/INH AEPB Inhale 1 puff into the lungs daily. 30 each 5   glucose blood (ACCU-CHEK GUIDE) test strip TEST BLOOD SUGAR TWICE DAILY Dx E11.9 200 strip 3   Insulin Pen Needle (PEN NEEDLES) 32G X 4 MM MISC 1 each by Does not apply route 2 (two) times daily. 90 each 3   ipratropium-albuterol (DUONEB) 0.5-2.5 (3) MG/3ML SOLN Take 3 mLs by nebulization every 4 (four) hours as needed. 360 mL 0   losartan (COZAAR) 50 MG tablet Take 1 tablet (50 mg total) by mouth daily. 90 tablet 3   memantine (NAMENDA) 10 MG tablet Take 1 tablet (10 mg total) by  mouth 2 (two) times daily. 180 tablet 3   sildenafil (REVATIO) 20 MG tablet Take 2-5 pills at once, orally, with each sexual encounter 50 tablet 5   TRESIBA FLEXTOUCH 100 UNIT/ML FlexTouch Pen Inject 0.2 mLs (20 Units total) into the skin daily at 10 pm. 5 pen 3   donepezil ([name]) 23 MG TABS tablet Take 1 tablet (23 mg total) by mouth at bedtime. 90 tablet 1   No current  facility-administered medications on file prior to visit.    ROS Review of Systems  Constitutional:  Negative for fever.  Respiratory:  Negative for shortness of breath.   Cardiovascular:  Negative for chest pain.  Musculoskeletal:  Negative for arthralgias.  Skin:  Negative for rash.    Objective:  BP (!) 161/69   Pulse 64   Temp 97.6 F (36.4 C)   Ht 6' (1.829 m)   Wt 166 lb 12.8 oz (75.7 kg)   SpO2 99%   BMI 22.62 kg/m   BP Readings from Last 3 Encounters:  04/19/22 (!) 161/69  12/14/21 123/71  08/31/21 137/66    Wt Readings from Last 3 Encounters:  04/19/22 166 lb 12.8 oz (75.7 kg)  01/01/22 168 lb (76.2 kg)  12/14/21 168 lb 6.4 oz (76.4 kg)     Physical Exam Vitals reviewed.  Constitutional:      Appearance: He is well-developed.  HENT:     Head: Normocephalic and atraumatic.     Right Ear: External ear normal.     Left Ear: External ear normal.     Mouth/Throat:     Pharynx: No oropharyngeal exudate or posterior oropharyngeal erythema.  Eyes:     Pupils: Pupils are equal, round, and reactive to light.  Cardiovascular:     Rate and Rhythm: Normal rate and regular rhythm.     Heart sounds: No murmur heard. Pulmonary:     Effort: No respiratory distress.     Breath sounds: Normal breath sounds.  Musculoskeletal:     Cervical back: Normal range of motion and neck supple.  Neurological:     Mental Status: He is alert and oriented to person, place, and time.     Diabetic Foot Exam - Simple   No data filed     Lab Results  Component Value Date   HGBA1C 5.6 04/19/2022   HGBA1C 5.8 (H) 12/14/2021   HGBA1C 5.9 (H) 08/31/2021    Assessment & Plan:   [name] was seen today for medical management of chronic issues.  Diagnoses and all orders for this visit:  Hypertension associated with type 2 diabetes mellitus ([place]) -     CBC with Differential/Platelet -     CMP14+EGFR  Hyperlipidemia associated with type 2 diabetes mellitus ([place]) -      Lipid panel  Type 2 diabetes mellitus with microalbuminuria, with long-term current use of insulin (HCC) -     Bayer DCA Hb A1c Waived -     Microalbumin / creatinine urine ratio  Need for immunization against influenza -     Flu Vaccine QUAD High Dose(Fluad)  Other orders -     QUEtiapine (SEROQUEL) 50 MG tablet; Take 1 tablet (50 mg total) by mouth at bedtime.   I have changed [name]'s QUEtiapine. I am also having him maintain his aspirin EC, Pen Needles, diclofenac Sodium, Tyler Aas FlexTouch, albuterol, Breo Ellipta, ipratropium-albuterol, Accu-Chek Aviva, Accu-Chek Guide, donepezil, Accu-Chek Guide, atorvastatin, losartan, memantine, sildenafil, DropSafe Alcohol Prep, and Accu-Chek Softclix Lancets.  Meds ordered  this encounter  Medications   QUEtiapine (SEROQUEL) 50 MG tablet    Sig: Take 1 tablet (50 mg total) by mouth at bedtime.    Dispense:  90 tablet    Refill:  1   DC salty foods. Yolanda Bonine will start checking BP frequently  Follow-up: Return in about 3 months (around 07/20/2022) for diabetes.  Claretta Fraise, M.D.

## 2022-04-20 LAB — CBC WITH DIFFERENTIAL/PLATELET
Basophils Absolute: 0 10*3/uL (ref 0.0–0.2)
Basos: 1 %
EOS (ABSOLUTE): 0.3 10*3/uL (ref 0.0–0.4)
Eos: 4 %
Hematocrit: 36.1 % — ABNORMAL LOW (ref 37.5–51.0)
Hemoglobin: 12.4 g/dL — ABNORMAL LOW (ref 13.0–17.7)
Immature Grans (Abs): 0 10*3/uL (ref 0.0–0.1)
Immature Granulocytes: 0 %
Lymphocytes Absolute: 1.6 10*3/uL (ref 0.7–3.1)
Lymphs: 25 %
MCH: 31.2 pg (ref 26.6–33.0)
MCHC: 34.3 g/dL (ref 31.5–35.7)
MCV: 91 fL (ref 79–97)
Monocytes Absolute: 0.5 10*3/uL (ref 0.1–0.9)
Monocytes: 7 %
Neutrophils Absolute: 4 10*3/uL (ref 1.4–7.0)
Neutrophils: 63 %
Platelets: 185 10*3/uL (ref 150–450)
RBC: 3.98 x10E6/uL — ABNORMAL LOW (ref 4.14–5.80)
RDW: 12.4 % (ref 11.6–15.4)
WBC: 6.4 10*3/uL (ref 3.4–10.8)

## 2022-04-20 LAB — CMP14+EGFR
ALT: 15 IU/L (ref 0–44)
AST: 18 IU/L (ref 0–40)
Albumin/Globulin Ratio: 1.5 (ref 1.2–2.2)
Albumin: 4.1 g/dL (ref 3.7–4.7)
Alkaline Phosphatase: 103 IU/L (ref 44–121)
BUN/Creatinine Ratio: 9 — ABNORMAL LOW (ref 10–24)
BUN: 12 mg/dL (ref 8–27)
Bilirubin Total: 0.6 mg/dL (ref 0.0–1.2)
CO2: 21 mmol/L (ref 20–29)
Calcium: 8.7 mg/dL (ref 8.6–10.2)
Chloride: 106 mmol/L (ref 96–106)
Creatinine, Ser: 1.34 mg/dL — ABNORMAL HIGH (ref 0.76–1.27)
Globulin, Total: 2.8 g/dL (ref 1.5–4.5)
Glucose: 119 mg/dL — ABNORMAL HIGH (ref 70–99)
Potassium: 4.8 mmol/L (ref 3.5–5.2)
Sodium: 142 mmol/L (ref 134–144)
Total Protein: 6.9 g/dL (ref 6.0–8.5)
eGFR: 52 mL/min/{1.73_m2} — ABNORMAL LOW (ref >59)

## 2022-04-20 LAB — LIPID PANEL
Chol/HDL Ratio: 1.7 ratio (ref 0.0–5.0)
Cholesterol, Total: 73 mg/dL — ABNORMAL LOW (ref 100–199)
HDL: 42 mg/dL (ref >39)
LDL Chol Calc (NIH): 15 mg/dL (ref 0–99)
Triglycerides: 74 mg/dL (ref 0–149)
VLDL Cholesterol Cal: 16 mg/dL (ref 5–40)

## 2022-04-21 ENCOUNTER — Encounter: Payer: Self-pay | Admitting: Family Medicine

## 2022-04-21 NOTE — Progress Notes (Signed)
Hello [name],  Your lab result is normal and/or stable.Some minor variations that are not significant are commonly marked abnormal, but do not represent any medical problem for you.  Best regards, Korryn Pancoast, M.D.

## 2022-06-11 ENCOUNTER — Telehealth: Payer: Self-pay | Admitting: Family Medicine

## 2022-06-11 NOTE — Telephone Encounter (Signed)
Appt made

## 2022-06-11 NOTE — Telephone Encounter (Signed)
Pts wife called requesting pt to see any provider on Wednesday or Thursday for shoulder pain. We dont have any openings.

## 2022-06-13 ENCOUNTER — Ambulatory Visit: Payer: Medicare PPO | Admitting: Family Medicine

## 2022-06-15 ENCOUNTER — Encounter: Payer: Self-pay | Admitting: Family Medicine

## 2022-06-15 ENCOUNTER — Ambulatory Visit (INDEPENDENT_AMBULATORY_CARE_PROVIDER_SITE_OTHER): Payer: Medicare PPO | Admitting: Family Medicine

## 2022-06-15 VITALS — BP 136/73 | HR 67 | Temp 97.5°F | Ht 72.0 in | Wt 165.4 lb

## 2022-06-15 DIAGNOSIS — M25512 Pain in left shoulder: Secondary | ICD-10-CM | POA: Diagnosis not present

## 2022-06-15 NOTE — Progress Notes (Signed)
   Acute Office Visit  Subjective:     Patient ID: [name], male    DOB: 05/23/1936, 86 y.o.   MRN: [id number]  Chief Complaint  Patient presents with   Shoulder Pain    Shoulder Pain    Here with wife today. Patient is in today for left shoulder pain. He reports that his left shoulder was hurting him earlier this week but is no longer hurting now. He denies injury or fall. He has had a prior rotator cuff repair in this shoulder. He denies chest pain, shortness of breath, weakness, swelling, erythema, tenderness, numbness, or tingling.   ROS As per HPI.      Objective:    BP 136/73   Pulse 67   Temp (!) 97.5 F (36.4 C) (Temporal)   Ht 6' (1.829 m)   Wt 165 lb 6 oz (75 kg)   SpO2 100%   BMI 22.43 kg/m  Wt Readings from Last 3 Encounters:  06/15/22 165 lb 6 oz (75 kg)  04/19/22 166 lb 12.8 oz (75.7 kg)  01/01/22 168 lb (76.2 kg)      Physical Exam Vitals and nursing note reviewed.  Constitutional:      General: He is not in acute distress.    Appearance: He is not ill-appearing, toxic-appearing or diaphoretic.  Cardiovascular:     Rate and Rhythm: Normal rate and regular rhythm.     Heart sounds: Normal heart sounds. No murmur heard. Pulmonary:     Effort: Pulmonary effort is normal. No respiratory distress.     Breath sounds: Normal breath sounds.  Musculoskeletal:     Left shoulder: No swelling, effusion, tenderness, bony tenderness or crepitus. Normal strength.  Neurological:     Mental Status: He is alert and oriented to person, place, and time. Mental status is at baseline.  Psychiatric:        Mood and Affect: Mood normal.        Behavior: Behavior normal.        Thought Content: Thought content normal.        Judgment: Judgment normal.     No results found for any visits on 06/15/22.      Assessment & Plan:   [name] was seen today for shoulder pain.  Diagnoses and all orders for this visit:  Acute pain of left shoulder Now resolved.  Benign exam.   Return if symptoms worsen or fail to improve.  The patient indicates understanding of these issues and agrees with the plan.  Gwenlyn Perking, [name]

## 2022-06-15 NOTE — Patient Instructions (Signed)
Shoulder Pain Many things can cause shoulder pain, including: An injury to the shoulder. Overuse of the shoulder. Arthritis. The source of the pain can be: Inflammation. An injury to the shoulder joint. An injury to a tendon, ligament, or bone. Follow these instructions at home: Pay attention to changes in your symptoms. Let your health care provider know about them. Follow these instructions to relieve your pain. If you have a sling: Wear the sling as told by your health care provider. Remove it only as told by your health care provider. Loosen the sling if your fingers tingle, become numb, or turn cold and blue. Keep the sling clean. If the sling is not waterproof: Do not let it get wet. Remove it to shower or bathe. Move your arm as little as possible, but keep your hand moving to prevent swelling. Managing pain, stiffness, and swelling  If directed, put ice on the painful area: Put ice in a plastic bag. Place a towel between your skin and the bag. Leave the ice on for 20 minutes, 2-3 times per day. Stop applying ice if it does not help with the pain. Squeeze a soft ball or a foam pad as much as possible. This helps to keep the shoulder from swelling. It also helps to strengthen the arm. General instructions Take over-the-counter and prescription medicines only as told by your health care provider. Keep all follow-up visits as told by your health care provider. This is important. Contact a health care provider if: Your pain gets worse. Your pain is not relieved with medicines. New pain develops in your arm, hand, or fingers. Get help right away if: Your arm, hand, or fingers: Tingle. Become numb. Become swollen. Become painful. Turn white or blue. Summary Shoulder pain can be caused by an injury, overuse, or arthritis. Pay attention to changes in your symptoms. Let your health care provider know about them. This condition may be treated with a sling, ice, and pain  medicines. Contact your health care provider if the pain gets worse or new pain develops. Get help right away if your arm, hand, or fingers tingle or become numb, swollen, or painful. Keep all follow-up visits as told by your health care provider. This is important. This information is not intended to replace advice given to you by your health care provider. Make sure you discuss any questions you have with your health care provider. Document Revised: 03/10/2021 Document Reviewed: 03/10/2021 Elsevier Patient Education  [employer].

## 2022-06-21 ENCOUNTER — Encounter: Payer: Self-pay | Admitting: Family Medicine

## 2022-07-23 ENCOUNTER — Encounter: Payer: Self-pay | Admitting: Family Medicine

## 2022-07-23 ENCOUNTER — Ambulatory Visit (INDEPENDENT_AMBULATORY_CARE_PROVIDER_SITE_OTHER): Payer: Medicare PPO | Admitting: Family Medicine

## 2022-07-23 VITALS — BP 98/55 | HR 87 | Temp 97.8°F | Ht 72.0 in | Wt 159.0 lb

## 2022-07-23 DIAGNOSIS — E1169 Type 2 diabetes mellitus with other specified complication: Secondary | ICD-10-CM

## 2022-07-23 DIAGNOSIS — G309 Alzheimer's disease, unspecified: Secondary | ICD-10-CM | POA: Diagnosis not present

## 2022-07-23 DIAGNOSIS — R809 Proteinuria, unspecified: Secondary | ICD-10-CM | POA: Diagnosis not present

## 2022-07-23 DIAGNOSIS — Z23 Encounter for immunization: Secondary | ICD-10-CM | POA: Diagnosis not present

## 2022-07-23 DIAGNOSIS — E1159 Type 2 diabetes mellitus with other circulatory complications: Secondary | ICD-10-CM

## 2022-07-23 DIAGNOSIS — I152 Hypertension secondary to endocrine disorders: Secondary | ICD-10-CM

## 2022-07-23 DIAGNOSIS — E1129 Type 2 diabetes mellitus with other diabetic kidney complication: Secondary | ICD-10-CM

## 2022-07-23 DIAGNOSIS — Z794 Long term (current) use of insulin: Secondary | ICD-10-CM | POA: Diagnosis not present

## 2022-07-23 DIAGNOSIS — E785 Hyperlipidemia, unspecified: Secondary | ICD-10-CM | POA: Diagnosis not present

## 2022-07-23 DIAGNOSIS — F02818 Dementia in other diseases classified elsewhere, unspecified severity, with other behavioral disturbance: Secondary | ICD-10-CM

## 2022-07-23 DIAGNOSIS — F0150 Vascular dementia without behavioral disturbance: Secondary | ICD-10-CM

## 2022-07-23 LAB — BAYER DCA HB A1C WAIVED: HB A1C (BAYER DCA - WAIVED): 5.3 % (ref 4.8–5.6)

## 2022-07-23 MED ORDER — MEGESTROL ACETATE 400 MG/10ML PO SUSP: 400.0000 mg | Freq: Two times a day (BID) | ORAL | 2 refills | Status: DC

## 2022-07-23 MED ORDER — MEMANTINE HCL 10 MG PO TABS: 10.0000 mg | ORAL_TABLET | Freq: Two times a day (BID) | ORAL | 3 refills | Status: DC

## 2022-07-23 MED ORDER — RIVASTIGMINE TARTRATE 3 MG PO CAPS: 3.0000 mg | ORAL_CAPSULE | Freq: Two times a day (BID) | ORAL | 3 refills | Status: DC

## 2022-07-23 NOTE — Progress Notes (Signed)
Subjective:  Patient ID: [name], male    DOB: Jul 06, 1936  Age: 87 y.o. MRN: [id number]  CC: Medical Management of Chronic Issues   HPI [name] presents forFollow-up of diabetes. Patient checks blood sugar at home.   100 and 180 Patient denies symptoms such as polyuria, polydipsia, excessive hunger, nausea No significant hypoglycemic spells noted. Medications reviewed. Pt reports taking them regularly without complication/adverse reaction being reported today.   Alzheimer progressing. Getting abusive, more forgetful. Cries easily. Wife gives most of his history today.   History [name] has a past medical history of Anxiety, Asthma, Choledocholithiasis (02/24/2013), COPD (chronic obstructive pulmonary disease) ([place]), CVA (cerebral vascular accident) ([place]) (2013), Dementia ([place]), Diabetes mellitus, TIA (transient ischemic attack), Hyperlipidemia, and Hypertension.   He has a past surgical history that includes Rotator cuff repair (Right); Cholecystectomy (N/A, 02/23/2013); ERCP (N/A, 02/24/2013); and Cataract extraction w/PHACO (Left, 04/30/2016).   His family history includes Diabetes in his sister and sister; Emphysema in his brother; Heart disease in his brother; Hypertension in his father and mother.He reports that he has never smoked. He has never used smokeless tobacco. He reports that he does not drink alcohol and does not use drugs.  Current Outpatient Medications on File Prior to Visit  Medication Sig Dispense Refill   Accu-Chek Softclix Lancets lancets TEST BLOOD SUGAR TWICE DAILY Dx E11.9 200 each 3   albuterol (VENTOLIN HFA) 108 (90 Base) MCG/ACT inhaler Inhale 2 puffs into the lungs every 4 (four) hours as needed for wheezing or shortness of breath. 18 g 5   Alcohol Swabs (DROPSAFE ALCOHOL PREP) 70 % PADS CHECK GLUCOSE TWICE A DAY DX E11.9 200 each 3   aspirin EC 81 MG tablet Take 162 mg by mouth daily.      atorvastatin (LIPITOR) 40 MG tablet Take 1 tablet (40 mg  total) by mouth daily. 90 tablet 3   Blood Glucose Calibration (ACCU-CHEK AVIVA) SOLN Use with glucometer Dx E11.9 3 each 0   Blood Glucose Monitoring Suppl (ACCU-CHEK GUIDE) w/Device KIT Check glucose twice a day Dx E11.9 1 kit 0   diclofenac Sodium (VOLTAREN) 1 % GEL Apply 2 g topically 4 (four) times daily. 350 g 1   fluticasone furoate-vilanterol (BREO ELLIPTA) 100-25 MCG/INH AEPB Inhale 1 puff into the lungs daily. 30 each 5   glucose blood (ACCU-CHEK GUIDE) test strip TEST BLOOD SUGAR TWICE DAILY Dx E11.9 200 strip 3   Insulin Pen Needle (PEN NEEDLES) 32G X 4 MM MISC 1 each by Does not apply route 2 (two) times daily. 90 each 3   ipratropium-albuterol (DUONEB) 0.5-2.5 (3) MG/3ML SOLN Take 3 mLs by nebulization every 4 (four) hours as needed. 360 mL 0   losartan (COZAAR) 50 MG tablet Take 1 tablet (50 mg total) by mouth daily. 90 tablet 3   QUEtiapine (SEROQUEL) 50 MG tablet Take 1 tablet (50 mg total) by mouth at bedtime. 90 tablet 1   sildenafil (REVATIO) 20 MG tablet Take 2-5 pills at once, orally, with each sexual encounter 50 tablet 5   TRESIBA FLEXTOUCH 100 UNIT/ML FlexTouch Pen Inject 0.2 mLs (20 Units total) into the skin daily at 10 pm. 5 pen 3   No current facility-administered medications on file prior to visit.    ROS Review of Systems  Constitutional:  Negative for fever.  Respiratory:  Negative for shortness of breath.   Cardiovascular:  Negative for chest pain.  Musculoskeletal:  Negative for arthralgias.  Skin:  Negative for rash.  Psychiatric/Behavioral:  Positive for agitation, confusion, decreased concentration and dysphoric mood.     Objective:  BP (!) 98/55   Pulse 87   Temp 97.8 F (36.6 C)   Ht 6' (1.829 m)   Wt 159 lb (72.1 kg)   SpO2 99%   BMI 21.56 kg/m   BP Readings from Last 3 Encounters:  07/23/22 (!) 98/55  06/15/22 136/73  04/19/22 (!) 161/69    Wt Readings from Last 3 Encounters:  07/23/22 159 lb (72.1 kg)  06/15/22 165 lb 6 oz (75 kg)   04/19/22 166 lb 12.8 oz (75.7 kg)     Physical Exam Vitals reviewed.  Constitutional:      Appearance: He is well-developed.  HENT:     Head: Normocephalic and atraumatic.     Right Ear: External ear normal.     Left Ear: External ear normal.     Mouth/Throat:     Pharynx: No oropharyngeal exudate or posterior oropharyngeal erythema.  Eyes:     Pupils: Pupils are equal, round, and reactive to light.  Cardiovascular:     Rate and Rhythm: Normal rate and regular rhythm.     Heart sounds: No murmur heard. Pulmonary:     Effort: No respiratory distress.     Breath sounds: Normal breath sounds.  Musculoskeletal:     Cervical back: Normal range of motion and neck supple.  Neurological:     Mental Status: He is alert. He is disoriented.  Psychiatric:        Mood and Affect: Affect is blunt.        Speech: Speech is slurred.        Behavior: Behavior is slowed.        Cognition and Memory: Cognition is impaired.        Judgment: Judgment is impulsive.       Assessment & Plan:   [name] was seen today for medical management of chronic issues.  Diagnoses and all orders for this visit:  Hypertension associated with type 2 diabetes mellitus ([place]) -     CBC with Differential/Platelet -     CMP14+EGFR  Hyperlipidemia associated with type 2 diabetes mellitus ([place]) -     Lipid panel  Type 2 diabetes mellitus with microalbuminuria, with long-term current use of insulin ([facility]) -     Bayer DCA Hb A1c Waived -     Microalbumin / creatinine urine ratio  Vascular dementia without behavioral disturbance ([facility]) -     memantine (NAMENDA) 10 MG tablet; Take 1 tablet (10 mg total) by mouth 2 (two) times daily.  Alzheimer's dementia with behavioral disturbance ([place])  Other orders -     rivastigmine (EXELON) 3 MG capsule; Take 1 capsule (3 mg total) by mouth 2 (two) times daily. -     megestrol (MEGACE) 400 MG/10ML suspension; Take 10 mLs (400 mg total) by mouth 2 (two) times daily.  For appetite stimulation      I have discontinued [name]'s donepezil. I am also having him start on rivastigmine and megestrol. Additionally, I am having him maintain his aspirin EC, Pen Needles, diclofenac Sodium, Tyler Aas FlexTouch, albuterol, Breo Ellipta, ipratropium-albuterol, Accu-Chek Aviva, Accu-Chek Guide, Accu-Chek Guide, atorvastatin, losartan, sildenafil, DropSafe Alcohol Prep, Accu-Chek Softclix Lancets, QUEtiapine, and memantine.  Meds ordered this encounter  Medications   memantine (NAMENDA) 10 MG tablet    Sig: Take 1 tablet (10 mg total) by mouth 2 (two) times daily.    Dispense:  180 tablet  Refill:  3   rivastigmine (EXELON) 3 MG capsule    Sig: Take 1 capsule (3 mg total) by mouth 2 (two) times daily.    Dispense:  180 capsule    Refill:  3   megestrol (MEGACE) 400 MG/10ML suspension    Sig: Take 10 mLs (400 mg total) by mouth 2 (two) times daily. For appetite stimulation    Dispense:  600 mL    Refill:  2     Follow-up: Return in about 1 month (around 08/23/2022) for dementia.  Claretta Fraise, M.D.

## 2022-07-23 NOTE — Addendum Note (Signed)
Addended by: Baldomero Lamy B on: 07/23/2022 02:26 PM   Modules accepted: Orders

## 2022-07-24 LAB — CMP14+EGFR
ALT: 13 IU/L (ref 0–44)
AST: 17 IU/L (ref 0–40)
Albumin/Globulin Ratio: 1.2 (ref 1.2–2.2)
Albumin: 4.2 g/dL (ref 3.7–4.7)
Alkaline Phosphatase: 95 IU/L (ref 44–121)
BUN/Creatinine Ratio: 12 (ref 10–24)
BUN: 16 mg/dL (ref 8–27)
Bilirubin Total: 1 mg/dL (ref 0.0–1.2)
CO2: 19 mmol/L — ABNORMAL LOW (ref 20–29)
Calcium: 9.1 mg/dL (ref 8.6–10.2)
Chloride: 108 mmol/L — ABNORMAL HIGH (ref 96–106)
Creatinine, Ser: 1.34 mg/dL — ABNORMAL HIGH (ref 0.76–1.27)
Globulin, Total: 3.4 g/dL (ref 1.5–4.5)
Glucose: 144 mg/dL — ABNORMAL HIGH (ref 70–99)
Potassium: 3.9 mmol/L (ref 3.5–5.2)
Sodium: 142 mmol/L (ref 134–144)
Total Protein: 7.6 g/dL (ref 6.0–8.5)
eGFR: 52 mL/min/{1.73_m2} — ABNORMAL LOW (ref >59)

## 2022-07-24 LAB — CBC WITH DIFFERENTIAL/PLATELET
Basophils Absolute: 0.1 10*3/uL (ref 0.0–0.2)
Basos: 1 %
EOS (ABSOLUTE): 0.2 10*3/uL (ref 0.0–0.4)
Eos: 2 %
Hematocrit: 36.5 % — ABNORMAL LOW (ref 37.5–51.0)
Hemoglobin: 12.7 g/dL — ABNORMAL LOW (ref 13.0–17.7)
Immature Grans (Abs): 0 10*3/uL (ref 0.0–0.1)
Immature Granulocytes: 0 %
Lymphocytes Absolute: 1.6 10*3/uL (ref 0.7–3.1)
Lymphs: 17 %
MCH: 30.8 pg (ref 26.6–33.0)
MCHC: 34.8 g/dL (ref 31.5–35.7)
MCV: 88 fL (ref 79–97)
Monocytes Absolute: 0.7 10*3/uL (ref 0.1–0.9)
Monocytes: 8 %
Neutrophils Absolute: 6.8 10*3/uL (ref 1.4–7.0)
Neutrophils: 72 %
Platelets: 224 10*3/uL (ref 150–450)
RBC: 4.13 x10E6/uL — ABNORMAL LOW (ref 4.14–5.80)
RDW: 11.4 % — ABNORMAL LOW (ref 11.6–15.4)
WBC: 9.3 10*3/uL (ref 3.4–10.8)

## 2022-07-24 LAB — LIPID PANEL
Chol/HDL Ratio: 1.6 ratio (ref 0.0–5.0)
Cholesterol, Total: 86 mg/dL — ABNORMAL LOW (ref 100–199)
HDL: 53 mg/dL (ref >39)
LDL Chol Calc (NIH): 17 mg/dL (ref 0–99)
Triglycerides: 79 mg/dL (ref 0–149)
VLDL Cholesterol Cal: 16 mg/dL (ref 5–40)

## 2022-07-24 NOTE — Progress Notes (Signed)
Hello [name],  Your lab result is normal and/or stable.Some minor variations that are not significant are commonly marked abnormal, but do not represent any medical problem for you.  Best regards, Claretta Fraise, M.D.

## 2022-08-14 ENCOUNTER — Telehealth: Payer: Self-pay | Admitting: Family Medicine

## 2022-08-14 ENCOUNTER — Other Ambulatory Visit: Payer: Self-pay | Admitting: Family Medicine

## 2022-08-14 MED ORDER — MIRTAZAPINE 15 MG PO TABS: 15.0000 mg | ORAL_TABLET | Freq: Every day | ORAL | 2 refills | Status: DC

## 2022-08-14 NOTE — Telephone Encounter (Signed)
Continue the rivastigmine. DC the memantine (namenda) I sent in mirtazapine to stimulate appetite instead of the megesterol (megace)

## 2022-08-14 NOTE — Telephone Encounter (Signed)
Pts wife called wanting to let Dr Livia Snellen know that she hasn't been able to pick up the 2 new meds that were recently prescribed to pt because the copay is $187. Wants to know if theres something cheaper than can be sent? Or something that insurance would cover?  Please advise and let wife know.

## 2022-08-14 NOTE — Telephone Encounter (Signed)
MOST RECENT PRESCRIPTIONS MEGACE AND NAMENDA

## 2022-08-15 NOTE — Telephone Encounter (Signed)
Wife aware

## 2022-08-23 ENCOUNTER — Encounter: Payer: Self-pay | Admitting: Family Medicine

## 2022-08-23 ENCOUNTER — Ambulatory Visit (INDEPENDENT_AMBULATORY_CARE_PROVIDER_SITE_OTHER): Payer: Medicare PPO | Admitting: Family Medicine

## 2022-08-23 VITALS — BP 138/69 | HR 64 | Temp 97.7°F | Ht 72.0 in | Wt 162.2 lb

## 2022-08-23 DIAGNOSIS — F0390 Unspecified dementia without behavioral disturbance: Secondary | ICD-10-CM | POA: Diagnosis not present

## 2022-08-23 NOTE — Progress Notes (Signed)
Subjective:  Patient ID: [name], male    DOB: 1935/07/30  Age: 87 y.o. MRN: [id number]  CC: Dementia   HPI [name] presents for recheck for the use of med for dementia.. Wife states [name] is doing well with the Exelon. No side effects noted, including no nausea. No dizziness. Oral is more affordable. Pt. Seems brighter, more cognizant, conversant. Tells story about his Gallbladder surgery 5 years ago. Discussing health of his kids. Wife says [name] is eating better.     10/10/2017    4:38 PM 09/06/2016    1:38 PM  MMSE - Mini Mental State Exam  Orientation to time 5 1  Orientation to Place 4 4  Registration 3 3  Attention/ Calculation 5 5  Recall 2 1  Language- name 2 objects 2 2  Language- repeat 1 1  Language- follow 3 step command 1 3  Language- read & follow direction 1 1  Write a sentence 1 1  Copy design 0 0  Total score 25 22       08/23/2022   10:40 AM 08/23/2022   10:32 AM 07/23/2022    1:21 PM  Depression screen PHQ 2/9  Decreased Interest 2 0 0  Down, Depressed, Hopeless 1 0 0  PHQ - 2 Score 3 0 0  Altered sleeping 2  0  Tired, decreased energy 2  0  Change in appetite 2  0  Feeling bad or failure about yourself  0  0  Trouble concentrating 1  0  Moving slowly or fidgety/restless 1  1  Suicidal thoughts 0  1  PHQ-9 Score 11  2  Difficult doing work/chores Somewhat difficult  Somewhat difficult    History [name] has a past medical history of Anxiety, Asthma, Choledocholithiasis (02/24/2013), COPD (chronic obstructive pulmonary disease) ([place]), CVA (cerebral vascular accident) ([place]) (2013), Dementia ([place]), Diabetes mellitus, TIA (transient ischemic attack), Hyperlipidemia, and Hypertension.   [name] has a past surgical history that includes Rotator cuff repair (Right); Cholecystectomy (N/A, 02/23/2013); ERCP (N/A, 02/24/2013); and Cataract extraction w/PHACO (Left, 04/30/2016).   His family history includes Diabetes in his sister and sister; Emphysema in his  brother; Heart disease in his brother; Hypertension in his father and mother.[name] reports that [name] has never smoked. [name] has never used smokeless tobacco. [name] reports that [name] does not drink alcohol and does not use drugs.    ROS Review of Systems  Constitutional:  Negative for fever.  Respiratory:  Negative for shortness of breath.   Cardiovascular:  Negative for chest pain.  Musculoskeletal:  Negative for arthralgias.  Skin:  Negative for rash.    Objective:  BP 138/69   Pulse 64   Temp 97.7 F (36.5 C)   Ht 6' (1.829 m)   Wt 162 lb 3.2 oz (73.6 kg)   SpO2 100%   BMI 22.00 kg/m   BP Readings from Last 3 Encounters:  08/23/22 138/69  07/23/22 (!) 98/55  06/15/22 136/73    Wt Readings from Last 3 Encounters:  08/23/22 162 lb 3.2 oz (73.6 kg)  07/23/22 159 lb (72.1 kg)  06/15/22 165 lb 6 oz (75 kg)     Physical Exam Vitals reviewed.  Constitutional:      Appearance: [name] is well-developed.  HENT:     Head: Normocephalic and atraumatic.     Right Ear: External ear normal.     Left Ear: External ear normal.     Mouth/Throat:     Pharynx: No  oropharyngeal exudate or posterior oropharyngeal erythema.  Eyes:     Pupils: Pupils are equal, round, and reactive to light.  Cardiovascular:     Rate and Rhythm: Normal rate and regular rhythm.     Heart sounds: No murmur heard. Pulmonary:     Effort: No respiratory distress.     Breath sounds: Normal breath sounds.  Musculoskeletal:     Cervical back: Normal range of motion and neck supple.  Neurological:     Mental Status: [name] is alert and oriented to person, place, and time.       Assessment & Plan:   [name] was seen today for dementia.  Diagnoses and all orders for this visit:  Dementia without behavioral disturbance ([place])       I am having [name] maintain his aspirin EC, Pen Needles, diclofenac Sodium, Tyler Aas FlexTouch, albuterol, Breo Ellipta, ipratropium-albuterol, Accu-Chek Aviva, Accu-Chek Guide,  Accu-Chek Guide, atorvastatin, losartan, sildenafil, DropSafe Alcohol Prep, Accu-Chek Softclix Lancets, QUEtiapine, rivastigmine, and mirtazapine.  Allergies as of 08/23/2022       Reactions   Ace Inhibitors Swelling        Medication List        Accurate as of August 23, 2022 11:10 AM. If you have any questions, ask your nurse or doctor.          Accu-Chek Aviva Soln Use with glucometer Dx E11.9   Accu-Chek Guide test strip Generic drug: glucose blood TEST BLOOD SUGAR TWICE DAILY Dx E11.9   Accu-Chek Guide w/Device Kit Check glucose twice a day Dx E11.9   Accu-Chek Softclix Lancets lancets TEST BLOOD SUGAR TWICE DAILY Dx E11.9   albuterol 108 (90 Base) MCG/ACT inhaler Commonly known as: VENTOLIN HFA Inhale 2 puffs into the lungs every 4 (four) hours as needed for wheezing or shortness of breath.   aspirin EC 81 MG tablet Take 162 mg by mouth daily.   atorvastatin 40 MG tablet Commonly known as: LIPITOR Take 1 tablet (40 mg total) by mouth daily.   Breo Ellipta 100-25 MCG/ACT Aepb Generic drug: fluticasone furoate-vilanterol Inhale 1 puff into the lungs daily.   diclofenac Sodium 1 % Gel Commonly known as: Voltaren Apply 2 g topically 4 (four) times daily.   DropSafe Alcohol Prep 70 % Pads CHECK GLUCOSE TWICE A DAY DX E11.9   ipratropium-albuterol 0.5-2.5 (3) MG/3ML Soln Commonly known as: DUONEB Take 3 mLs by nebulization every 4 (four) hours as needed.   losartan 50 MG tablet Commonly known as: COZAAR Take 1 tablet (50 mg total) by mouth daily.   mirtazapine 15 MG tablet Commonly known as: REMERON Take 1 tablet (15 mg total) by mouth at bedtime. For appetite   Pen Needles 32G X 4 MM Misc 1 each by Does not apply route 2 (two) times daily.   QUEtiapine 50 MG tablet Commonly known as: SEROQUEL Take 1 tablet (50 mg total) by mouth at bedtime.   rivastigmine 3 MG capsule Commonly known as: EXELON Take 1 capsule (3 mg total) by mouth 2 (two)  times daily.   sildenafil 20 MG tablet Commonly known as: REVATIO Take 2-5 pills at once, orally, with each sexual encounter   Tresiba FlexTouch 100 UNIT/ML FlexTouch Pen Generic drug: insulin degludec Inject 0.2 mLs (20 Units total) into the skin daily at 10 pm.         Follow-up: Return in about 2 months (around 10/22/2022) for diabetes, dementia.  Claretta Fraise, M.D.

## 2022-08-28 ENCOUNTER — Other Ambulatory Visit: Payer: Self-pay | Admitting: *Deleted

## 2022-08-28 DIAGNOSIS — E1169 Type 2 diabetes mellitus with other specified complication: Secondary | ICD-10-CM

## 2022-08-28 MED ORDER — ATORVASTATIN CALCIUM 40 MG PO TABS: 40.0000 mg | ORAL_TABLET | Freq: Every day | ORAL | 0 refills | Status: DC

## 2022-08-29 ENCOUNTER — Other Ambulatory Visit: Payer: Self-pay | Admitting: *Deleted

## 2022-08-29 DIAGNOSIS — E1159 Type 2 diabetes mellitus with other circulatory complications: Secondary | ICD-10-CM

## 2022-08-29 MED ORDER — MIRTAZAPINE 15 MG PO TABS: 15.0000 mg | ORAL_TABLET | Freq: Every day | ORAL | 0 refills | Status: DC

## 2022-08-29 MED ORDER — ACCU-CHEK GUIDE W/DEVICE KIT: PACK | 0 refills | Status: DC

## 2022-08-29 MED ORDER — LOSARTAN POTASSIUM 50 MG PO TABS: 50.0000 mg | ORAL_TABLET | Freq: Every day | ORAL | 0 refills | Status: DC

## 2022-11-10 ENCOUNTER — Other Ambulatory Visit: Payer: Self-pay | Admitting: Family Medicine

## 2022-11-10 DIAGNOSIS — E1159 Type 2 diabetes mellitus with other circulatory complications: Secondary | ICD-10-CM

## 2022-11-10 DIAGNOSIS — E1169 Type 2 diabetes mellitus with other specified complication: Secondary | ICD-10-CM

## 2022-11-12 ENCOUNTER — Other Ambulatory Visit: Payer: Self-pay | Admitting: Family Medicine

## 2022-11-22 ENCOUNTER — Encounter: Payer: Self-pay | Admitting: Family Medicine

## 2022-11-22 ENCOUNTER — Ambulatory Visit (INDEPENDENT_AMBULATORY_CARE_PROVIDER_SITE_OTHER): Payer: Medicare PPO | Admitting: Family Medicine

## 2022-11-22 VITALS — BP 125/70 | HR 66 | Temp 98.2°F | Ht 72.0 in | Wt 161.4 lb

## 2022-11-22 DIAGNOSIS — I152 Hypertension secondary to endocrine disorders: Secondary | ICD-10-CM

## 2022-11-22 DIAGNOSIS — E1159 Type 2 diabetes mellitus with other circulatory complications: Secondary | ICD-10-CM

## 2022-11-22 DIAGNOSIS — E1169 Type 2 diabetes mellitus with other specified complication: Secondary | ICD-10-CM | POA: Diagnosis not present

## 2022-11-22 DIAGNOSIS — E785 Hyperlipidemia, unspecified: Secondary | ICD-10-CM | POA: Diagnosis not present

## 2022-11-22 DIAGNOSIS — Z794 Long term (current) use of insulin: Secondary | ICD-10-CM | POA: Diagnosis not present

## 2022-11-22 LAB — LIPID PANEL: Cholesterol, Total: 86 mg/dL — ABNORMAL LOW (ref 100–199)

## 2022-11-22 LAB — CBC WITH DIFFERENTIAL/PLATELET
Basophils Absolute: 0 10*3/uL (ref 0.0–0.2)
Hematocrit: 35.4 % — ABNORMAL LOW (ref 37.5–51.0)
Hemoglobin: 12.4 g/dL — ABNORMAL LOW (ref 13.0–17.7)
Immature Grans (Abs): 0 10*3/uL (ref 0.0–0.1)
Immature Granulocytes: 0 %

## 2022-11-22 LAB — CMP14+EGFR
BUN/Creatinine Ratio: 9 — ABNORMAL LOW (ref 10–24)
CO2: 21 mmol/L (ref 20–29)
Calcium: 8.6 mg/dL (ref 8.6–10.2)
Creatinine, Ser: 1.25 mg/dL (ref 0.76–1.27)

## 2022-11-22 LAB — BAYER DCA HB A1C WAIVED: HB A1C (BAYER DCA - WAIVED): 5.3 % (ref 4.8–5.6)

## 2022-11-22 MED ORDER — QUETIAPINE FUMARATE 25 MG PO TABS: 25.0000 mg | ORAL_TABLET | Freq: Every day | ORAL | 1 refills | Status: DC

## 2022-11-22 MED ORDER — RIVASTIGMINE TARTRATE 3 MG PO CAPS: 3.0000 mg | ORAL_CAPSULE | Freq: Two times a day (BID) | ORAL | 3 refills | Status: DC

## 2022-11-22 NOTE — Progress Notes (Signed)
Subjective:  Patient ID: [name], male    DOB: 26-Mar-1936  Age: 87 y.o. MRN: [id number]  CC: Medical Management of Chronic Issues (3 month)   HPI [name] presents forFollow-up of diabetes. Patient checks blood sugar at home.   100-120 fasting and not checking postprandial Patient denies symptoms such as polyuria, polydipsia, excessive hunger, nausea No significant hypoglycemic spells noted. Medications reviewed. Pt reports taking them regularly without complication/adverse reaction being reported today.   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  History [name] has a past medical history of Anxiety, Asthma, Choledocholithiasis (02/24/2013), COPD (chronic obstructive pulmonary disease) ([facility]), CVA (cerebral vascular accident) ([facility]) (2013), Dementia ([facility]), Diabetes mellitus, TIA (transient ischemic attack), Hyperlipidemia, and Hypertension.   [name] has a past surgical history that includes Rotator cuff repair (Right); Cholecystectomy (N/A, 02/23/2013); ERCP (N/A, 02/24/2013); and Cataract extraction w/PHACO (Left, 04/30/2016).   His family history includes Diabetes in his sister and sister; Emphysema in his brother; Heart disease in his brother; Hypertension in his father and mother.[name] reports that [name] has never smoked. [name] has never used smokeless tobacco. [name] reports that [name] does not drink alcohol and does not use drugs.  Current Outpatient Medications on File Prior to Visit  Medication Sig Dispense Refill   Accu-Chek Softclix Lancets lancets TEST BLOOD SUGAR TWICE DAILY Dx E11.9 200 each 3   Alcohol Swabs (DROPSAFE ALCOHOL PREP) 70 % PADS CHECK GLUCOSE TWICE A DAY DX E11.9 200 each 3   aspirin EC 81 MG tablet Take 162 mg by mouth daily.      atorvastatin (LIPITOR) 40 MG tablet TAKE 1 TABLET EVERY DAY 90 tablet 0   Blood Glucose Calibration (ACCU-CHEK AVIVA) SOLN Use with  glucometer Dx E11.9 3 each 0   Blood Glucose Monitoring Suppl (ACCU-CHEK GUIDE) w/Device KIT Check glucose twice a day Dx E11.9 1 kit 0   glucose blood (ACCU-CHEK GUIDE) test strip TEST BLOOD SUGAR TWICE DAILY Dx E11.9 200 strip 3   losartan (COZAAR) 50 MG tablet TAKE 1 TABLET EVERY DAY 90 tablet 0   mirtazapine (REMERON) 15 MG tablet TAKE 1 TABLET AT BEDTIME FOR APPETITE AS DIRECTED 90 tablet 0   sildenafil (REVATIO) 20 MG tablet Take 2-5 pills at once, orally, with each sexual encounter 50 tablet 5   albuterol (VENTOLIN HFA) 108 (90 Base) MCG/ACT inhaler Inhale 2 puffs into the lungs every 4 (four) hours as needed for wheezing or shortness of breath. (Patient not taking: Reported on 11/22/2022) 18 g 5   diclofenac Sodium (VOLTAREN) 1 % GEL Apply 2 g topically 4 (four) times daily. (Patient not taking: Reported on 11/22/2022) 350 g 1   fluticasone furoate-vilanterol (BREO ELLIPTA) 100-25 MCG/INH AEPB Inhale 1 puff into the lungs daily. (Patient not taking: Reported on 11/22/2022) 30 each 5   Insulin Pen Needle (PEN NEEDLES) 32G X 4 MM MISC 1 each by Does not apply route 2 (two) times daily. (Patient not taking: Reported on 11/22/2022) 90 each 3   ipratropium-albuterol (DUONEB) 0.5-2.5 (3) MG/3ML SOLN Take 3 mLs by nebulization every 4 (four) hours as needed. (Patient not taking: Reported on 11/22/2022) 360 mL 0   No current facility-administered medications on file prior to visit.    ROS Review of Systems  Constitutional:  Negative for fever.  Respiratory:  Negative for shortness of breath.   Cardiovascular:  Negative for chest  pain.  Musculoskeletal:  Negative for arthralgias.  Skin:  Negative for rash.  Psychiatric/Behavioral:  Positive for confusion.     Objective:  BP 125/70   Pulse 66   Temp 98.2 F (36.8 C) (Temporal)   Ht 6' (1.829 m)   Wt 161 lb 6.4 oz (73.2 kg)   SpO2 98%   BMI 21.89 kg/m   BP Readings from Last 3 Encounters:  11/22/22 125/70  08/23/22 138/69  07/23/22 (!)  98/55    Wt Readings from Last 3 Encounters:  11/22/22 161 lb 6.4 oz (73.2 kg)  08/23/22 162 lb 3.2 oz (73.6 kg)  07/23/22 159 lb (72.1 kg)     Physical Exam Vitals reviewed.  Constitutional:      Appearance: [name] is well-developed.  HENT:     Head: Normocephalic and atraumatic.     Right Ear: External ear normal.     Left Ear: External ear normal.     Mouth/Throat:     Pharynx: No oropharyngeal exudate or posterior oropharyngeal erythema.  Eyes:     Pupils: Pupils are equal, round, and reactive to light.  Cardiovascular:     Rate and Rhythm: Normal rate and regular rhythm.     Heart sounds: No murmur heard. Pulmonary:     Effort: No respiratory distress.     Breath sounds: Normal breath sounds.  Musculoskeletal:     Cervical back: Normal range of motion and neck supple.  Neurological:     Mental Status: [name] is alert and oriented to person, place, and time.       Assessment & Plan:   [name] was seen today for medical management of chronic issues.  Diagnoses and all orders for this visit:  Hypertension associated with type 2 diabetes mellitus ([facility]) -     CBC with Differential/Platelet -     CMP14+EGFR  Hyperlipidemia associated with type 2 diabetes mellitus ([facility]) -     Lipid panel  Type 2 diabetes mellitus with other specified complication, with long-term current use of insulin ([facility]) -     Bayer DCA Hb A1c Waived -     Microalbumin / creatinine urine ratio  Other orders -     rivastigmine (EXELON) 3 MG capsule; Take 1 capsule (3 mg total) by mouth 2 (two) times daily. For memory -     QUEtiapine (SEROQUEL) 25 MG tablet; Take 1 tablet (25 mg total) by mouth at bedtime. For sleep and for nerves      I have discontinued [name]'s [name] FlexTouch. I have also changed his rivastigmine and QUEtiapine. Additionally, I am having him maintain his aspirin EC, Pen Needles, diclofenac Sodium, albuterol, Breo Ellipta, ipratropium-albuterol, Accu-Chek Aviva,  Accu-Chek Guide, sildenafil, DropSafe Alcohol Prep, Accu-Chek Softclix Lancets, Accu-Chek Guide, losartan, atorvastatin, and mirtazapine.  Meds ordered this encounter  Medications   rivastigmine (EXELON) 3 MG capsule    Sig: Take 1 capsule (3 mg total) by mouth 2 (two) times daily. For memory    Dispense:  180 capsule    Refill:  3   QUEtiapine (SEROQUEL) 25 MG tablet    Sig: Take 1 tablet (25 mg total) by mouth at bedtime. For sleep and for nerves    Dispense:  90 tablet    Refill:  1     Follow-up: Return in about 3 months (around 02/22/2023).  Mechele Claude, M.D.

## 2022-11-23 LAB — CBC WITH DIFFERENTIAL/PLATELET
Basos: 1 %
EOS (ABSOLUTE): 0.3 10*3/uL (ref 0.0–0.4)
Eos: 5 %
Lymphocytes Absolute: 1.3 10*3/uL (ref 0.7–3.1)
Lymphs: 24 %
MCH: 30.1 pg (ref 26.6–33.0)
MCHC: 35 g/dL (ref 31.5–35.7)
MCV: 86 fL (ref 79–97)
Monocytes Absolute: 0.4 10*3/uL (ref 0.1–0.9)
Monocytes: 8 %
Neutrophils Absolute: 3.4 10*3/uL (ref 1.4–7.0)
Neutrophils: 62 %
Platelets: 193 10*3/uL (ref 150–450)
RBC: 4.12 x10E6/uL — ABNORMAL LOW (ref 4.14–5.80)
RDW: 12.2 % (ref 11.6–15.4)
WBC: 5.4 10*3/uL (ref 3.4–10.8)

## 2022-11-23 LAB — CMP14+EGFR
ALT: 14 IU/L (ref 0–44)
AST: 17 IU/L (ref 0–40)
Albumin/Globulin Ratio: 1.3 (ref 1.2–2.2)
Albumin: 4 g/dL (ref 3.7–4.7)
Alkaline Phosphatase: 124 IU/L — ABNORMAL HIGH (ref 44–121)
BUN: 11 mg/dL (ref 8–27)
Bilirubin Total: 0.5 mg/dL (ref 0.0–1.2)
Chloride: 106 mmol/L (ref 96–106)
Globulin, Total: 3.1 g/dL (ref 1.5–4.5)
Glucose: 117 mg/dL — ABNORMAL HIGH (ref 70–99)
Potassium: 4.1 mmol/L (ref 3.5–5.2)
Sodium: 142 mmol/L (ref 134–144)
Total Protein: 7.1 g/dL (ref 6.0–8.5)
eGFR: 57 mL/min/{1.73_m2} — ABNORMAL LOW (ref >59)

## 2022-11-23 LAB — LIPID PANEL
LDL Chol Calc (NIH): 27 mg/dL (ref 0–99)
Triglycerides: 67 mg/dL (ref 0–149)
VLDL Cholesterol Cal: 15 mg/dL (ref 5–40)

## 2022-11-23 LAB — MICROALBUMIN / CREATININE URINE RATIO
Creatinine, Urine: 167.6 mg/dL
Microalb/Creat Ratio: 29 mg/g creat (ref 0–29)
Microalbumin, Urine: 48.9 ug/mL

## 2022-11-23 NOTE — Progress Notes (Signed)
Hello Gu[place],  Your lab result is normal and/or stable.Some minor variations that are not significant are commonly marked abnormal, but do not represent any medical problem for you.  Best regards, Sameeha Rockefeller, M.D.

## 2022-11-27 ENCOUNTER — Encounter: Payer: Self-pay | Admitting: Family Medicine

## 2023-01-03 ENCOUNTER — Ambulatory Visit (INDEPENDENT_AMBULATORY_CARE_PROVIDER_SITE_OTHER): Payer: Medicare PPO

## 2023-01-03 VITALS — Ht 69.0 in | Wt 160.0 lb

## 2023-01-03 DIAGNOSIS — Z0000 Encounter for general adult medical examination without abnormal findings: Secondary | ICD-10-CM

## 2023-01-03 NOTE — Patient Instructions (Signed)
[name] , Thank you for taking time to come for your Medicare Wellness Visit. I appreciate your ongoing commitment to your health goals. Please review the following plan we discussed and let me know if I can assist you in the future.   These are the goals we discussed:  Goals       "We want to keep his blood sugar under control" (pt-stated)      Current Barriers:  Cognitive Deficits  Non adherence to recommendations  Nurse Case Manager Clinical Goal(s):  Over the next 90 days, patient will attend all scheduled medical appointments Over the next 90 days, the patient will demonstrate ongoing self health care management ability as evidenced by maintaining an A1C of 7  Interventions:  Evaluation of current treatment plan related to diabetes management and patient's adherence to plan as established by provider. Chart reviewed including recent office notes and lab results Dicsussed recent lab results A1C improved to 7 Provided encouragement for keeping blood sugar under control as evidenced by an A1C of 7 Reviewed medications Discussed home blood sugar testing Typically testing once a day Discussed mobility and ability to perform ADLs Discussed plans with patient for ongoing care management follow up and provided patient with direct contact information for care management team Advised patient, providing education and rationale, to check cbg daily and record, calling [phone] for findings outside established parameters.   Reviewed upcoming appointments Previously provided with RNCM contact number and encouraged to reach out as needed   Patient Self Care Activities:  Performs ADL's independently  Has assistance from wife as needed  Please see past updates related to this goal by clicking on the "Past Updates" button in the selected goal         "We want to manage his dementia and keep it from getting worse" (pt-stated)      Current Barriers:  Knowledge Deficits related to  dementia disease process.  Nurse Case Manager Clinical Goal(s):  Over the next 30 days, patient will verbalize understanding of plan for dementia Over the next 60 days, patient and wife will work with PCP regarding the effectiveness of Namenda and [name].  Interventions:  Evaluation of current treatment plan related to dementia and patient's adherence to plan as established by provider. Advised patient to continue to stay physically and socially active.  Physical activity and socialization are very important and can help decrease anxiety and depression and can have a positive impact on cognitive function.  Wife reports that he walks daily to visit his son and his brother who both live close by.  Patient enjoys riding in the car but is not able to drive at this point. Encouraged a healthy diet with lean proteins, low glycemic fruits, vegetables, and whole grains.  Wife reports that he is not eating foods that he normally liked. He wants to eat out somewhere like [employer] everyday.  Provided education to patient re: dementia vs Alzhemier's  Patient Self Care Activities:  Currently UNABLE TO independently drive Performs ADL's independently  Has help from wife  Please see past updates related to this goal by clicking on the "Past Updates" button in the selected goal         Client will verbalize knowledge of diabetes self-management as evidenced by Hgb A1C <7 or as defined by provider.      Diabetes self management actions: Glucose monitoring per provider recommendations Perform Quality checks on blood meter Eat Healthy Check feet daily Visit provider every 3-6 months as directed  Hbg A1C level every 3-6 months. Eye Exam yearly      DIET - EAT MORE VEGETABLES      Increase vegetables to 2-3 servings per day.  Reduce chocolate intake to 1 hershey bar per week.       Exercise 150 min/wk Moderate Activity      Manage My Emotions; Manage Mood issues; Complete ADLs as needed       Timeframe:  Short-Term Goal Priority:  Medium Progress: On Track Start Date:          09/28/21                  Expected End Date:        12/25/21            Follow Up Date 11/22/21 at 10:00 AM    Manage Emotions; Manage mood issues; Complete ADLs as needed    Why is this important?   When you are stressed, down or upset, your body reacts too.  For example, your blood pressure may get higher; you may have a headache or stomachache.  When your emotions get the best of you, your body's ability to fight off cold and flu gets weak.  These steps will help you manage your emotions.     Patient Strengths/Coping Skills: Has strong support from spouse, [name] Completes most ADLs on his own Allows time to rest and relax  Patient Deficits:  Memory issues Some walking challenges Some mood issues  Patient Goals: In next 30 days, patient will:  Attend all scheduled medical appointments Communicate regularly with spouse, [name], to discuss client needs Take medications as prescribed Allow time to rest and practice self care activities -  Follow Up Plan: LCSW to call client or spouse of client on 11/22/21 at 10:00 AM to assess client needs       Prevent falls      01/01/2022 -Try to get more active      Respiratory Disease Management            CARE PLAN ENTRY (see longitudinal plan of care for additional care plan information)  Current Barriers:  Chronic Disease Management support and education needs related to COPD and asthma Cognitive Deficits  Nurse Case Manager Clinical Goal(s):  Over the next 90 days, patient will demonstrate a decrease in COPD/asthma exacerbations as evidenced by following COPD action plan and having no ED visits for exacerbations Over the next 90 days, patient will attend all scheduled medical appointments: Dr Delton Coombes 06/01/20   Interventions:  Inter-disciplinary care team collaboration (see longitudinal plan of care) Chart reviewed including recent  office notes, ED notes, and lab results Reviewed and discussed medications: Duoneb, albuterol, and Breo Patient is compliant with using Breo daily Has used Duoneb approximately twice this week and albuterol once Discussed recent exacerbation and management Symptoms have improved back to baseline Discussed mobility and ability to perform ADLs Reviewed upcoming appt with pulmonologist on 06/01/20 Provided with RN Care Manager telephone number and encouraged to reach out as needed  Patient Self Care Activities:  Performs most ADLs independently with assistance from wife as needed Unable to drive  Initial goal documentation         This is a list of the screening recommended for you and due dates:  Health Maintenance  Topic Date Due   Eye exam for diabetics  02/02/2017   COVID-19 Vaccine (3 - 2023-24 season) 03/09/2022   Complete foot exam   04/20/2022  Flu Shot  02/07/2023   Hemoglobin A1C  05/25/2023   Yearly kidney function blood test for diabetes  11/22/2023   Yearly kidney health urinalysis for diabetes  11/22/2023   Medicare Annual Wellness Visit  01/03/2024   DTaP/Tdap/Td vaccine (2 - Tdap) 07/04/2026   Pneumonia Vaccine  Completed   Zoster (Shingles) Vaccine  Completed   HPV Vaccine  Aged Out    Advanced directives: Advance directive discussed with you today. I have provided a copy for you to complete at home and have notarized. Once this is complete please bring a copy in to our office so we can scan it into your chart. Information on Advanced Care Planning can be found at [facility] Advance Health Care Directives Advance Health Care Directives ([email or web address]    Conditions/risks identified: Aim for 30 minutes of exercise or brisk walking, 6-8 glasses of water, and 5 servings of fruits and vegetables each day.   Next appointment: Follow up in one year for your annual wellness visit.   Preventive Care 63 Years and Older, Male  Preventive care  refers to lifestyle choices and visits with your health care provider that can promote health and wellness. What does preventive care include? A yearly physical exam. This is also called an annual well check. Dental exams once or twice a year. Routine eye exams. Ask your health care provider how often you should have your eyes checked. Personal lifestyle choices, including: Daily care of your teeth and gums. Regular physical activity. Eating a healthy diet. Avoiding tobacco and drug use. Limiting alcohol use. Practicing safe sex. Taking low doses of aspirin every day. Taking vitamin and mineral supplements as recommended by your health care provider. What happens during an annual well check? The services and screenings done by your health care provider during your annual well check will depend on your age, overall health, lifestyle risk factors, and family history of disease. Counseling  Your health care provider may ask you questions about your: Alcohol use. Tobacco use. Drug use. Emotional well-being. Home and relationship well-being. Sexual activity. Eating habits. History of falls. Memory and ability to understand (cognition). Work and work Astronomer. Screening  You may have the following tests or measurements: Height, weight, and BMI. Blood pressure. Lipid and cholesterol levels. These may be checked every 5 years, or more frequently if you are over 62 years old. Skin check. Lung cancer screening. You may have this screening every year starting at age 30 if you have a 30-pack-year history of smoking and currently smoke or have quit within the past 15 years. Fecal occult blood test (FOBT) of the stool. You may have this test every year starting at age 103. Flexible sigmoidoscopy or colonoscopy. You may have a sigmoidoscopy every 5 years or a colonoscopy every 10 years starting at age 33. Prostate cancer screening. Recommendations will vary depending on your family history  and other risks. Hepatitis C blood test. Hepatitis B blood test. Sexually transmitted disease (STD) testing. Diabetes screening. This is done by checking your blood sugar (glucose) after you have not eaten for a while (fasting). You may have this done every 1-3 years. Abdominal aortic aneurysm (AAA) screening. You may need this if you are a current or former smoker. Osteoporosis. You may be screened starting at age 45 if you are at high risk. Talk with your health care provider about your test results, treatment options, and if necessary, the need for more tests. Vaccines  Your health care provider may  recommend certain vaccines, such as: Influenza vaccine. This is recommended every year. Tetanus, diphtheria, and acellular pertussis (Tdap, Td) vaccine. You may need a Td booster every 10 years. Zoster vaccine. You may need this after age 83. Pneumococcal 13-valent conjugate (PCV13) vaccine. One dose is recommended after age 31. Pneumococcal polysaccharide (PPSV23) vaccine. One dose is recommended after age 74. Talk to your health care provider about which screenings and vaccines you need and how often you need them. This information is not intended to replace advice given to you by your health care provider. Make sure you discuss any questions you have with your health care provider. Document Released: 07/22/2015 Document Revised: 03/14/2016 Document Reviewed: 04/26/2015 Elsevier Interactive Patient Education  2017 [employer].  Fall Prevention in the Home Falls can cause injuries. They can happen to people of all ages. There are many things you can do to make your home safe and to help prevent falls. What can I do on the outside of my home? Regularly fix the edges of walkways and driveways and fix any cracks. Remove anything that might make you trip as you walk through a door, such as a raised step or threshold. Trim any bushes or trees on the path to your home. Use bright outdoor  lighting. Clear any walking paths of anything that might make someone trip, such as rocks or tools. Regularly check to see if handrails are loose or broken. Make sure that both sides of any steps have handrails. Any raised decks and porches should have guardrails on the edges. Have any leaves, snow, or ice cleared regularly. Use sand or salt on walking paths during winter. Clean up any spills in your garage right away. This includes oil or grease spills. What can I do in the bathroom? Use night lights. Install grab bars by the toilet and in the tub and shower. Do not use towel bars as grab bars. Use non-skid mats or decals in the tub or shower. If you need to sit down in the shower, use a plastic, non-slip stool. Keep the floor dry. Clean up any water that spills on the floor as soon as it happens. Remove soap buildup in the tub or shower regularly. Attach bath mats securely with double-sided non-slip rug tape. Do not have throw rugs and other things on the floor that can make you trip. What can I do in the bedroom? Use night lights. Make sure that you have a light by your bed that is easy to reach. Do not use any sheets or blankets that are too big for your bed. They should not hang down onto the floor. Have a firm chair that has side arms. You can use this for support while you get dressed. Do not have throw rugs and other things on the floor that can make you trip. What can I do in the kitchen? Clean up any spills right away. Avoid walking on wet floors. Keep items that you use a lot in easy-to-reach places. If you need to reach something above you, use a strong step stool that has a grab bar. Keep electrical cords out of the way. Do not use floor polish or wax that makes floors slippery. If you must use wax, use non-skid floor wax. Do not have throw rugs and other things on the floor that can make you trip. What can I do with my stairs? Do not leave any items on the stairs. Make  sure that there are handrails on both sides of  the stairs and use them. Fix handrails that are broken or loose. Make sure that handrails are as long as the stairways. Check any carpeting to make sure that it is firmly attached to the stairs. Fix any carpet that is loose or worn. Avoid having throw rugs at the top or bottom of the stairs. If you do have throw rugs, attach them to the floor with carpet tape. Make sure that you have a light switch at the top of the stairs and the bottom of the stairs. If you do not have them, ask someone to add them for you. What else can I do to help prevent falls? Wear shoes that: Do not have high heels. Have rubber bottoms. Are comfortable and fit you well. Are closed at the toe. Do not wear sandals. If you use a stepladder: Make sure that it is fully opened. Do not climb a closed stepladder. Make sure that both sides of the stepladder are locked into place. Ask someone to hold it for you, if possible. Clearly mark and make sure that you can see: Any grab bars or handrails. First and last steps. Where the edge of each step is. Use tools that help you move around (mobility aids) if they are needed. These include: Canes. Walkers. Scooters. Crutches. Turn on the lights when you go into a dark area. Replace any light bulbs as soon as they burn out. Set up your furniture so you have a clear path. Avoid moving your furniture around. If any of your floors are uneven, fix them. If there are any pets around you, be aware of where they are. Review your medicines with your doctor. Some medicines can make you feel dizzy. This can increase your chance of falling. Ask your doctor what other things that you can do to help prevent falls. This information is not intended to replace advice given to you by your health care provider. Make sure you discuss any questions you have with your health care provider. Document Released: 04/21/2009 Document Revised: 12/01/2015  Document Reviewed: 07/30/2014 Elsevier Interactive Patient Education  2017 [employer].

## 2023-01-03 NOTE — Progress Notes (Signed)
Subjective:   [name] is a 87 y.o. male who presents for Medicare Annual/Subsequent preventive examination.  Visit Complete: Virtual  I connected with  [name] on 01/03/23 by a audio enabled telemedicine application and verified that I am speaking with the correct person using two identifiers.  Patient Location: Home  Provider Location: Home Office  I discussed the limitations of evaluation and management by telemedicine. The patient expressed understanding and agreed to proceed.  Patient Medicare AWV questionnaire was completed by the patient on 01/03/2023; I have confirmed that all information answered by patient is correct and no changes since this date.  Review of Systems    Nutrition Risk Assessment:  Has the patient had any N/V/D within the last 2 months?  No  Does the patient have any non-healing wounds?  No  Has the patient had any unintentional weight loss or weight gain?  No   Diabetes:  Is the patient diabetic?  Yes  If diabetic, was a CBG obtained today?  No  Did the patient bring in their glucometer from home?  No  How often do you monitor your CBG's? Once a week .   Financial Strains and Diabetes Management:  Are you having any financial strains with the device, your supplies or your medication? No .  Does the patient want to be seen by Chronic Care Management for management of their diabetes?  No  Would the patient like to be referred to a Nutritionist or for Diabetic Management?  No   Diabetic Exams:  Diabetic Eye Exam: Completed 05/2022 Diabetic Foot Exam: Overdue, Pt has been advised about the importance in completing this exam. Pt is scheduled for diabetic foot exam on next office visit .  Cardiac Risk Factors include: advanced age (>29men, >30 women);diabetes mellitus;dyslipidemia;male gender;hypertension     Objective:    Today's Vitals   01/03/23 1045  Weight: 160 lb (72.6 kg)  Height: 5\' 9"  (1.753 m)   Body mass index is 23.63  kg/m.     01/03/2023   10:49 AM 01/01/2022   10:46 AM 12/29/2020   11:29 AM 12/24/2020    7:10 PM 04/29/2020   10:07 AM 12/29/2019   10:16 AM 12/26/2018   10:36 AM  Advanced Directives  Does Patient Have a Medical Advance Directive? No No No No No No No  Would patient like information on creating a medical advance directive? No - Patient declined No - Patient declined No - Patient declined   No - Patient declined Yes (MAU/Ambulatory/Procedural Areas - Information given)    Current Medications (verified) Outpatient Encounter Medications as of 01/03/2023  Medication Sig   Accu-Chek Softclix Lancets lancets TEST BLOOD SUGAR TWICE DAILY Dx E11.9   albuterol (VENTOLIN HFA) 108 (90 Base) MCG/ACT inhaler Inhale 2 puffs into the lungs every 4 (four) hours as needed for wheezing or shortness of breath.   Alcohol Swabs (DROPSAFE ALCOHOL PREP) 70 % PADS CHECK GLUCOSE TWICE A DAY DX E11.9   aspirin EC 81 MG tablet Take 162 mg by mouth daily.    atorvastatin (LIPITOR) 40 MG tablet TAKE 1 TABLET EVERY DAY   Blood Glucose Calibration (ACCU-CHEK AVIVA) SOLN Use with glucometer Dx E11.9   Blood Glucose Monitoring Suppl (ACCU-CHEK GUIDE) w/Device KIT Check glucose twice a day Dx E11.9   diclofenac Sodium (VOLTAREN) 1 % GEL Apply 2 g topically 4 (four) times daily.   fluticasone furoate-vilanterol (BREO ELLIPTA) 100-25 MCG/INH AEPB Inhale 1 puff into the lungs daily.  glucose blood (ACCU-CHEK GUIDE) test strip TEST BLOOD SUGAR TWICE DAILY Dx E11.9   Insulin Pen Needle (PEN NEEDLES) 32G X 4 MM MISC 1 each by Does not apply route 2 (two) times daily.   ipratropium-albuterol (DUONEB) 0.5-2.5 (3) MG/3ML SOLN Take 3 mLs by nebulization every 4 (four) hours as needed.   losartan (COZAAR) 50 MG tablet TAKE 1 TABLET EVERY DAY   mirtazapine (REMERON) 15 MG tablet TAKE 1 TABLET AT BEDTIME FOR APPETITE AS DIRECTED   QUEtiapine (SEROQUEL) 25 MG tablet Take 1 tablet (25 mg total) by mouth at bedtime. For sleep and for  nerves   rivastigmine (EXELON) 3 MG capsule Take 1 capsule (3 mg total) by mouth 2 (two) times daily. For memory   sildenafil (REVATIO) 20 MG tablet Take 2-5 pills at once, orally, with each sexual encounter   No facility-administered encounter medications on file as of 01/03/2023.    Allergies (verified) Ace inhibitors   History: Past Medical History:  Diagnosis Date   Anxiety    Asthma    Choledocholithiasis 02/24/2013   COPD (chronic obstructive pulmonary disease) (HCC)    CVA (cerebral vascular accident) (HCC) 2013   loss of memory   Dementia (HCC)    Diabetes mellitus    Hx-TIA (transient ischemic attack)    Hyperlipidemia    Hypertension    Past Surgical History:  Procedure Laterality Date   CATARACT EXTRACTION W/PHACO Left 04/30/2016   Procedure: CATARACT EXTRACTION PHACO AND INTRAOCULAR LENS PLACEMENT LEFT EYE CDE=9.83;  Surgeon: Gemma Payor, MD;  Location: [facility];  Service: Ophthalmology;  Laterality: Left;  left   CHOLECYSTECTOMY N/A 02/23/2013   Procedure: LAPAROSCOPIC CHOLECYSTECTOMY;  Surgeon: Dalia Heading, MD;  Location: [facility];  Service: General;  Laterality: N/A;   ERCP N/A 02/24/2013   Procedure: ENDOSCOPIC RETROGRADE CHOLANGIOPANCREATOGRAPHY;  Surgeon: Corbin Ade, MD;  Location: [facility];  Service: Endoscopy;  Laterality: N/A;   ROTATOR CUFF REPAIR Right    Family History  Problem Relation Age of Onset   Hypertension Mother    Hypertension Father    Diabetes Sister    Diabetes Sister    Heart disease Brother    Emphysema Brother    Colon cancer Neg Hx    Social History   Socioeconomic History   Marital status: Married    Spouse name: [name]   Number of children: 6   Years of education: 11th   Highest education level: 11th grade  Occupational History   Occupation: Retired    Comment: security - [employer]   Tobacco Use   Smoking status: Never   Smokeless tobacco: Never  Building services engineer Use: Never used  Substance and Sexual Activity    Alcohol use: No   Drug use: No   Sexual activity: Not Currently  Other Topics Concern   Not on file  Social History Narrative   Patient lives at home with wife, [name] - Grandson lives with them right  now and son lives across the street.   Caffeine Use: 1 cup of coffee and 2 can pepsi   Since his stroke, memory has declined and he has poor fine motor skills   Social Determinants of Health   Financial Resource Strain: Low Risk  (01/03/2023)   Overall Financial Resource Strain (CARDIA)    Difficulty of Paying Living Expenses: Not hard at all  Food Insecurity: No Food Insecurity (01/03/2023)   Hunger Vital Sign    Worried About Running Out of  Food in the Last Year: Never true    Ran Out of Food in the Last Year: Never true  Transportation Needs: No Transportation Needs (01/03/2023)   PRAPARE - Administrator, Civil Service (Medical): No    Lack of Transportation (Non-Medical): No  Physical Activity: Insufficiently Active (01/03/2023)   Exercise Vital Sign    Days of Exercise per Week: 3 days    Minutes of Exercise per Session: 30 min  Stress: No Stress Concern Present (01/03/2023)   [employer] of Occupational Health - Occupational Stress Questionnaire    Feeling of Stress : Not at all  Social Connections: Moderately Integrated (01/03/2023)   Social Connection and Isolation Panel [NHANES]    Frequency of Communication with Friends and Family: More than three times a week    Frequency of Social Gatherings with Friends and Family: More than three times a week    Attends Religious Services: 1 to 4 times per year    Active Member of [employer] or Organizations: No    Attends Engineer, structural: Never    Marital Status: Married    Tobacco Counseling Counseling given: Not Answered   Clinical Intake:  Pre-visit preparation completed: Yes  Pain : No/denies pain     Nutritional Risks: None Diabetes: Yes CBG done?: No Did pt. bring in CBG monitor from  home?: No  How often do you need to have someone help you when you read instructions, pamphlets, or other written materials from your doctor or pharmacy?: 1 - Never  Interpreter Needed?: No  Information entered by :: Renie Ora, LPN   Activities of Daily Living    01/03/2023   10:49 AM  In your present state of health, do you have any difficulty performing the following activities:  Hearing? 0  Vision? 0  Difficulty concentrating or making decisions? 0  Walking or climbing stairs? 0  Dressing or bathing? 0  Doing errands, shopping? 0  Preparing Food and eating ? N  Using the Toilet? N  In the past six months, have you accidently leaked urine? N  Do you have problems with loss of bowel control? N  Managing your Medications? N  Managing your Finances? N  Housekeeping or managing your Housekeeping? N    Patient Care Team: Mechele Claude, MD as PCP - General (Family Medicine) Randa Spike, Kelton Pillar, LCSW as Social Worker (Licensed Clinical Social Worker) Delton Coombes, Les Pou, MD as Consulting Physician (Pulmonary Disease) Dione Booze, Bertram Millard, MD as Consulting Physician (Ophthalmology)  Indicate any recent Medical Services you may have received from other than [facility] providers in the past year (date may be approximate).     Assessment:   This is a routine wellness examination for [name].  Hearing/Vision screen Vision Screening - Comments:: Wears rx glasses - up to date with routine eye exams with  Dr.johnson   Dietary issues and exercise activities discussed:     Goals Addressed             This Visit's Progress    DIET - EAT MORE VEGETABLES   On track    Increase vegetables to 2-3 servings per day.  Reduce chocolate intake to 1 hershey bar per week.        Depression Screen    01/03/2023   10:48 AM 11/22/2022   10:47 AM 08/23/2022   10:40 AM 08/23/2022   10:32 AM 07/23/2022    1:21 PM 07/23/2022   12:56 PM 04/19/2022    1:16 PM  PHQ 2/9 Scores  PHQ - 2 Score 0 0  3 0 0 0 0  PHQ- 9 Score 0 5 11  2       Fall Risk    01/03/2023   10:46 AM 11/22/2022   10:48 AM 08/23/2022   10:32 AM 07/23/2022   12:56 PM 04/19/2022    1:16 PM  Fall Risk   Falls in the past year? 0 0 0 0 0  Number falls in past yr: 0 0     Injury with Fall? 0 0     Risk for fall due to : No Fall Risks No Fall Risks     Follow up Falls prevention discussed Falls evaluation completed       MEDICARE RISK AT HOME:  Medicare Risk at Home - 01/03/23 1047     Any stairs in or around the home? No    If so, are there any without handrails? No    Home free of loose throw rugs in walkways, pet beds, electrical cords, etc? Yes    Adequate lighting in your home to reduce risk of falls? Yes    Life alert? No    Use of a cane, walker or w/c? No    Grab bars in the bathroom? Yes    Shower chair or bench in shower? Yes    Elevated toilet seat or a handicapped toilet? Yes             TIMED UP AND GO:  Was the test performed?  No    Cognitive Function:    10/10/2017    4:38 PM 09/06/2016    1:38 PM  MMSE - Mini Mental State Exam  Orientation to time 5 1  Orientation to Place 4 4  Registration 3 3  Attention/ Calculation 5 5  Recall 2 1  Language- name 2 objects 2 2  Language- repeat 1 1  Language- follow 3 step command 1 3  Language- read & follow direction 1 1  Write a sentence 1 1  Copy design 0 0  Total score 25 22        01/03/2023   10:49 AM 01/01/2022   10:42 AM 12/29/2019   10:18 AM 12/26/2018   10:55 AM  6CIT Screen  What Year? 0 points 4 points 0 points 4 points  What month? 0 points 3 points 0 points 3 points  What time? 0 points 0 points 0 points 3 points  Count back from 20 0 points 0 points 2 points 4 points  Months in reverse 0 points 4 points 0 points 4 points  Repeat phrase 0 points 10 points 10 points 6 points  Total Score 0 points 21 points 12 points 24 points    Immunizations Immunization History  Administered Date(s) Administered   Fluad  Quad(high Dose 65+) 04/16/2019, 04/12/2020, 04/20/2021, 04/19/2022   Influenza Split 03/26/2012   Influenza, High Dose Seasonal PF 03/28/2016, 04/08/2017, 04/09/2018   Influenza,inj,Quad PF,6+ Mos 04/21/2014   Influenza-Unspecified 05/10/2015   Moderna Sars-Covid-2 Vaccination 09/17/2019, 10/17/2019   Pneumococcal Conjugate-13 08/18/2015   Pneumococcal Polysaccharide-23 06/08/2014   Td 07/04/2016   Zoster Recombinat (Shingrix) 12/14/2021, 07/23/2022    TDAP status: Up to date  Flu Vaccine status: Up to date  Pneumococcal vaccine status: Up to date  Covid-19 vaccine status: Completed vaccines  Qualifies for Shingles Vaccine? Yes   Zostavax completed Yes   Shingrix Completed?: Yes  Screening Tests Health Maintenance  Topic Date Due   OPHTHALMOLOGY EXAM  02/02/2017   COVID-19 Vaccine (3 - 2023-24 season) 03/09/2022   FOOT EXAM  04/20/2022   INFLUENZA VACCINE  02/07/2023   HEMOGLOBIN A1C  05/25/2023   Diabetic kidney evaluation - eGFR measurement  11/22/2023   Diabetic kidney evaluation - Urine ACR  11/22/2023   Medicare Annual Wellness (AWV)  01/03/2024   DTaP/Tdap/Td (2 - Tdap) 07/04/2026   Pneumonia Vaccine 33+ Years old  Completed   Zoster Vaccines- Shingrix  Completed   HPV VACCINES  Aged Out    Health Maintenance  Health Maintenance Due  Topic Date Due   OPHTHALMOLOGY EXAM  02/02/2017   COVID-19 Vaccine (3 - 2023-24 season) 03/09/2022   FOOT EXAM  04/20/2022    Colorectal cancer screening: No longer required.   Lung Cancer Screening: (Low Dose CT Chest recommended if Age 14-80 years, 20 pack-year currently smoking OR have quit w/in 15years.) does not qualify.   Lung Cancer Screening Referral: n/a  Additional Screening:  Hepatitis C Screening: does not qualify;   Vision Screening: Recommended annual ophthalmology exams for early detection of glaucoma and other disorders of the eye. Is the patient up to date with their annual eye exam?  Yes  Who is the  provider or what is the name of the office in which the patient attends annual eye exams? Dr.johnson  If pt is not established with a provider, would they like to be referred to a provider to establish care? No .   Dental Screening: Recommended annual dental exams for proper oral hygiene  Diabetic Foot Exam: Diabetic Foot Exam: Overdue, Pt has been advised about the importance in completing this exam. Pt is scheduled for diabetic foot exam on next office visit .  Community Resource Referral / Chronic Care Management: CRR required this visit?  No   CCM required this visit?  No     Plan:     I have personally reviewed and noted the following in the patient's chart:   Medical and social history Use of alcohol, tobacco or illicit drugs  Current medications and supplements including opioid prescriptions. Patient is not currently taking opioid prescriptions. Functional ability and status Nutritional status Physical activity Advanced directives List of other physicians Hospitalizations, surgeries, and ER visits in previous 12 months Vitals Screenings to include cognitive, depression, and falls Referrals and appointments  In addition, I have reviewed and discussed with patient certain preventive protocols, quality metrics, and best practice recommendations. A written personalized care plan for preventive services as well as general preventive health recommendations were provided to patient.     Lorrene Reid, LPN   4/40/1027   After Visit Summary: (MyChart) Due to this being a telephonic visit, the after visit summary with patients personalized plan was offered to patient via MyChart   Nurse Notes: none

## 2023-01-08 ENCOUNTER — Telehealth: Payer: Self-pay | Admitting: Family Medicine

## 2023-01-08 NOTE — Telephone Encounter (Signed)
I do not. However, to make it official he would need evaluation by psychiatry

## 2023-01-09 NOTE — Telephone Encounter (Signed)
WIFE AWARE

## 2023-01-24 ENCOUNTER — Other Ambulatory Visit: Payer: Self-pay | Admitting: Family Medicine

## 2023-01-24 DIAGNOSIS — E1169 Type 2 diabetes mellitus with other specified complication: Secondary | ICD-10-CM

## 2023-01-24 DIAGNOSIS — E1159 Type 2 diabetes mellitus with other circulatory complications: Secondary | ICD-10-CM

## 2023-02-25 ENCOUNTER — Ambulatory Visit (INDEPENDENT_AMBULATORY_CARE_PROVIDER_SITE_OTHER): Payer: Medicare PPO | Admitting: Family Medicine

## 2023-02-25 ENCOUNTER — Encounter: Payer: Self-pay | Admitting: Family Medicine

## 2023-02-25 VITALS — BP 148/74 | HR 64 | Temp 98.3°F | Ht 69.0 in | Wt 162.6 lb

## 2023-02-25 DIAGNOSIS — E785 Hyperlipidemia, unspecified: Secondary | ICD-10-CM | POA: Diagnosis not present

## 2023-02-25 DIAGNOSIS — I152 Hypertension secondary to endocrine disorders: Secondary | ICD-10-CM | POA: Diagnosis not present

## 2023-02-25 DIAGNOSIS — E1169 Type 2 diabetes mellitus with other specified complication: Secondary | ICD-10-CM | POA: Diagnosis not present

## 2023-02-25 DIAGNOSIS — F0390 Unspecified dementia without behavioral disturbance: Secondary | ICD-10-CM | POA: Diagnosis not present

## 2023-02-25 DIAGNOSIS — E1159 Type 2 diabetes mellitus with other circulatory complications: Secondary | ICD-10-CM | POA: Diagnosis not present

## 2023-02-25 DIAGNOSIS — Z794 Long term (current) use of insulin: Secondary | ICD-10-CM | POA: Diagnosis not present

## 2023-02-25 LAB — BAYER DCA HB A1C WAIVED: HB A1C (BAYER DCA - WAIVED): 5.7 % — ABNORMAL HIGH (ref 4.8–5.6)

## 2023-02-25 MED ORDER — ATORVASTATIN CALCIUM 40 MG PO TABS
40.0000 mg | ORAL_TABLET | Freq: Every day | ORAL | 3 refills | Status: DC
Start: 2023-02-25 — End: 2024-01-16

## 2023-02-25 MED ORDER — RIVASTIGMINE TARTRATE 4.5 MG PO CAPS: 4.5000 mg | ORAL_CAPSULE | Freq: Two times a day (BID) | ORAL | 3 refills | Status: DC

## 2023-02-25 MED ORDER — MIRTAZAPINE 15 MG PO TABS: 15.0000 mg | ORAL_TABLET | Freq: Every day | ORAL | 3 refills | Status: DC

## 2023-02-25 MED ORDER — LOSARTAN POTASSIUM 50 MG PO TABS
50.0000 mg | ORAL_TABLET | Freq: Every day | ORAL | 3 refills | Status: DC
Start: 2023-02-25 — End: 2024-01-16

## 2023-02-25 NOTE — Progress Notes (Signed)
Subjective:  Patient ID: [name],  male    DOB: [birth year]  Age: 87 y.o.    CC: Medical Management of Chronic Issues   HPI [name] presents for  follow-up of hypertension. Patient has no history of headache chest pain or shortness of breath or recent cough. Patient also denies symptoms of TIA such as numbness weakness lateralizing. Patient denies side effects from medication. States taking it regularly.  Patient also  in for follow-up of elevated cholesterol. Doing well without complaints on current medication. Denies side effects  including myalgia and arthralgia and nausea. Also in today for liver function testing. Currently no chest pain, shortness of breath or other cardiovascular related symptoms noted.  Follow-up of diabetes. Patient does check blood sugar at home. Readings run between 100 and 120 fasting. Eating less Patient denies symptoms such as excessive hunger or urinary frequency, excessive hunger, nausea No significant hypoglycemic spells noted. Medications reviewed. Pt reports taking them regularly. Pt. denies complication/adverse reaction today.   Wife says he is doing well with rivastigmine, but could use a little help with the memory and cognition   History [name] has a past medical history of Anxiety, Asthma, Choledocholithiasis (02/24/2013), COPD (chronic obstructive pulmonary disease) (HCC), CVA (cerebral vascular accident) (HCC) (2013), Dementia (HCC), Diabetes mellitus, TIA (transient ischemic attack), Hyperlipidemia, and Hypertension.   He has a past surgical history that includes Rotator cuff repair (Right); Cholecystectomy (N/A, 02/23/2013); ERCP (N/A, 02/24/2013); and Cataract extraction w/PHACO (Left, 04/30/2016).   His family history includes Diabetes in his sister and sister; Emphysema in his brother; Heart disease in his brother; Hypertension in his father and mother.He reports that he has never smoked. He has never used smokeless tobacco. He  reports that he does not drink alcohol and does not use drugs.  Current Outpatient Medications on File Prior to Visit  Medication Sig Dispense Refill   Accu-Chek Softclix Lancets lancets TEST BLOOD SUGAR TWICE DAILY Dx E11.9 200 each 3   albuterol (VENTOLIN HFA) 108 (90 Base) MCG/ACT inhaler Inhale 2 puffs into the lungs every 4 (four) hours as needed for wheezing or shortness of breath. 18 g 5   Alcohol Swabs (DROPSAFE ALCOHOL PREP) 70 % PADS CHECK GLUCOSE TWICE A DAY DX E11.9 200 each 3   aspirin EC 81 MG tablet Take 162 mg by mouth daily.      Blood Glucose Calibration (ACCU-CHEK AVIVA) SOLN Use with glucometer Dx E11.9 3 each 0   Blood Glucose Monitoring Suppl (ACCU-CHEK GUIDE) w/Device KIT Check glucose twice a day Dx E11.9 1 kit 0   diclofenac Sodium (VOLTAREN) 1 % GEL Apply 2 g topically 4 (four) times daily. 350 g 1   fluticasone furoate-vilanterol (BREO ELLIPTA) 100-25 MCG/INH AEPB Inhale 1 puff into the lungs daily. 30 each 5   glucose blood (ACCU-CHEK GUIDE) test strip TEST BLOOD SUGAR TWICE DAILY Dx E11.9 200 strip 3   Insulin Pen Needle (PEN NEEDLES) 32G X 4 MM MISC 1 each by Does not apply route 2 (two) times daily. 90 each 3   ipratropium-albuterol (DUONEB) 0.5-2.5 (3) MG/3ML SOLN Take 3 mLs by nebulization every 4 (four) hours as needed. 360 mL 0   QUEtiapine (SEROQUEL) 25 MG tablet Take 1 tablet (25 mg total) by mouth at bedtime. For sleep and for nerves 90 tablet 1   sildenafil (REVATIO) 20 MG tablet Take 2-5 pills at once, orally, with each sexual encounter 50 tablet 5   No current facility-administered medications on file  prior to visit.    ROS Review of Systems  Constitutional:  Negative for fever.  Respiratory:  Negative for shortness of breath.   Cardiovascular:  Negative for chest pain.  Musculoskeletal:  Negative for arthralgias.  Skin:  Negative for rash.    Objective:  BP (!) 148/74   Pulse 64   Temp 98.3 F (36.8 C)   Ht 5\' 9"  (1.753 m)   Wt 162 lb 9.6  oz (73.8 kg)   SpO2 98%   BMI 24.01 kg/m   BP Readings from Last 3 Encounters:  02/25/23 (!) 148/74  11/22/22 125/70  08/23/22 138/69    Wt Readings from Last 3 Encounters:  02/25/23 162 lb 9.6 oz (73.8 kg)  01/03/23 160 lb (72.6 kg)  11/22/22 161 lb 6.4 oz (73.2 kg)     Physical Exam Vitals reviewed.  Constitutional:      Appearance: He is well-developed.  HENT:     Head: Normocephalic and atraumatic.     Right Ear: External ear normal.     Left Ear: External ear normal.     Mouth/Throat:     Pharynx: No oropharyngeal exudate or posterior oropharyngeal erythema.  Eyes:     Pupils: Pupils are equal, round, and reactive to light.  Cardiovascular:     Rate and Rhythm: Normal rate and regular rhythm.     Heart sounds: No murmur heard. Pulmonary:     Effort: No respiratory distress.     Breath sounds: Normal breath sounds.  Musculoskeletal:     Cervical back: Normal range of motion and neck supple.  Neurological:     Mental Status: He is alert and oriented to person, place, and time.     Diabetic Foot Exam - Simple   Simple Foot Form Diabetic Foot exam was performed with the following findings: Yes 02/25/2023 10:35 AM  Visual Inspection No deformities, no ulcerations, no other skin breakdown bilaterally: Yes Sensation Testing Intact to touch and monofilament testing bilaterally: Yes Pulse Check Posterior Tibialis and Dorsalis pulse intact bilaterally: Yes Comments     Lab Results  Component Value Date   HGBA1C 5.3 11/22/2022   HGBA1C 5.3 07/23/2022   HGBA1C 5.6 04/19/2022    Assessment & Plan:   [name] was seen today for medical management of chronic issues.  Diagnoses and all orders for this visit:  Hypertension associated with type 2 diabetes mellitus (HCC) -     CBC with Differential/Platelet -     CMP14+EGFR -     losartan (COZAAR) 50 MG tablet; Take 1 tablet (50 mg total) by mouth daily.  Hyperlipidemia associated with type 2 diabetes mellitus  (HCC) -     Lipid panel -     atorvastatin (LIPITOR) 40 MG tablet; Take 1 tablet (40 mg total) by mouth daily.  Type 2 diabetes mellitus with other specified complication, with long-term current use of insulin (HCC) -     Bayer DCA Hb A1c Waived  Dementia without behavioral disturbance (HCC)  Other orders -     mirtazapine (REMERON) 15 MG tablet; Take 1 tablet (15 mg total) by mouth at bedtime. -     rivastigmine (EXELON) 4.5 MG capsule; Take 1 capsule (4.5 mg total) by mouth 2 (two) times daily. For memory   I have changed Tina L. Aumiller's atorvastatin, losartan, mirtazapine, and rivastigmine. I am also having him maintain his aspirin EC, Pen Needles, diclofenac Sodium, albuterol, Breo Ellipta, ipratropium-albuterol, Accu-Chek Aviva, Accu-Chek Guide, sildenafil, DropSafe Alcohol Prep, Accu-Chek Softclix  Lancets, Accu-Chek Guide, and QUEtiapine.  Meds ordered this encounter  Medications   atorvastatin (LIPITOR) 40 MG tablet    Sig: Take 1 tablet (40 mg total) by mouth daily.    Dispense:  90 tablet    Refill:  3   losartan (COZAAR) 50 MG tablet    Sig: Take 1 tablet (50 mg total) by mouth daily.    Dispense:  90 tablet    Refill:  3   mirtazapine (REMERON) 15 MG tablet    Sig: Take 1 tablet (15 mg total) by mouth at bedtime.    Dispense:  90 tablet    Refill:  3   rivastigmine (EXELON) 4.5 MG capsule    Sig: Take 1 capsule (4.5 mg total) by mouth 2 (two) times daily. For memory    Dispense:  180 capsule    Refill:  3     Follow-up: Return in about 3 months (around 05/28/2023).  Mechele Claude, M.D.

## 2023-02-26 LAB — LIPID PANEL
Chol/HDL Ratio: 2 ratio (ref 0.0–5.0)
Cholesterol, Total: 82 mg/dL — ABNORMAL LOW (ref 100–199)
HDL: 42 mg/dL (ref >39)
LDL Chol Calc (NIH): 23 mg/dL (ref 0–99)
Triglycerides: 79 mg/dL (ref 0–149)
VLDL Cholesterol Cal: 17 mg/dL (ref 5–40)

## 2023-02-26 LAB — CMP14+EGFR
ALT: 16 IU/L (ref 0–44)
AST: 16 IU/L (ref 0–40)
Albumin: 3.8 g/dL (ref 3.7–4.7)
Alkaline Phosphatase: 105 IU/L (ref 44–121)
BUN/Creatinine Ratio: 11 (ref 10–24)
BUN: 16 mg/dL (ref 8–27)
Bilirubin Total: 0.6 mg/dL (ref 0.0–1.2)
CO2: 21 mmol/L (ref 20–29)
Calcium: 8.5 mg/dL — ABNORMAL LOW (ref 8.6–10.2)
Chloride: 107 mmol/L — ABNORMAL HIGH (ref 96–106)
Creatinine, Ser: 1.43 mg/dL — ABNORMAL HIGH (ref 0.76–1.27)
Globulin, Total: 2.9 g/dL (ref 1.5–4.5)
Glucose: 135 mg/dL — ABNORMAL HIGH (ref 70–99)
Potassium: 3.7 mmol/L (ref 3.5–5.2)
Sodium: 140 mmol/L (ref 134–144)
Total Protein: 6.7 g/dL (ref 6.0–8.5)
eGFR: 48 mL/min/{1.73_m2} — ABNORMAL LOW (ref >59)

## 2023-02-26 LAB — CBC WITH DIFFERENTIAL/PLATELET
Basophils Absolute: 0 10*3/uL (ref 0.0–0.2)
Basos: 1 %
EOS (ABSOLUTE): 0.3 10*3/uL (ref 0.0–0.4)
Eos: 6 %
Hematocrit: 33.5 % — ABNORMAL LOW (ref 37.5–51.0)
Hemoglobin: 11.7 g/dL — ABNORMAL LOW (ref 13.0–17.7)
Immature Grans (Abs): 0 10*3/uL (ref 0.0–0.1)
Immature Granulocytes: 0 %
Lymphocytes Absolute: 1.6 10*3/uL (ref 0.7–3.1)
Lymphs: 31 %
MCH: 30.4 pg (ref 26.6–33.0)
MCHC: 34.9 g/dL (ref 31.5–35.7)
MCV: 87 fL (ref 79–97)
Monocytes Absolute: 0.5 10*3/uL (ref 0.1–0.9)
Monocytes: 10 %
Neutrophils Absolute: 2.7 10*3/uL (ref 1.4–7.0)
Neutrophils: 52 %
Platelets: 173 10*3/uL (ref 150–450)
RBC: 3.85 x10E6/uL — ABNORMAL LOW (ref 4.14–5.80)
RDW: 12.5 % (ref 11.6–15.4)
WBC: 5.2 10*3/uL (ref 3.4–10.8)

## 2023-04-07 ENCOUNTER — Other Ambulatory Visit: Payer: Self-pay | Admitting: Family Medicine

## 2023-04-17 ENCOUNTER — Ambulatory Visit (INDEPENDENT_AMBULATORY_CARE_PROVIDER_SITE_OTHER): Payer: Medicare PPO

## 2023-04-17 DIAGNOSIS — Z23 Encounter for immunization: Secondary | ICD-10-CM | POA: Diagnosis not present

## 2023-05-10 LAB — HM DIABETES EYE EXAM

## 2023-05-28 ENCOUNTER — Ambulatory Visit: Payer: Medicare PPO | Admitting: Family Medicine

## 2023-05-28 ENCOUNTER — Ambulatory Visit (INDEPENDENT_AMBULATORY_CARE_PROVIDER_SITE_OTHER): Payer: Medicare PPO | Admitting: Family Medicine

## 2023-05-28 ENCOUNTER — Encounter: Payer: Self-pay | Admitting: Family Medicine

## 2023-05-28 VITALS — BP 134/78 | HR 75 | Temp 98.7°F | Ht 69.0 in | Wt 162.2 lb

## 2023-05-28 DIAGNOSIS — I152 Hypertension secondary to endocrine disorders: Secondary | ICD-10-CM | POA: Diagnosis not present

## 2023-05-28 DIAGNOSIS — E1159 Type 2 diabetes mellitus with other circulatory complications: Secondary | ICD-10-CM | POA: Diagnosis not present

## 2023-05-28 DIAGNOSIS — F0390 Unspecified dementia without behavioral disturbance: Secondary | ICD-10-CM | POA: Diagnosis not present

## 2023-05-28 DIAGNOSIS — E785 Hyperlipidemia, unspecified: Secondary | ICD-10-CM

## 2023-05-28 DIAGNOSIS — Z794 Long term (current) use of insulin: Secondary | ICD-10-CM

## 2023-05-28 DIAGNOSIS — E1169 Type 2 diabetes mellitus with other specified complication: Secondary | ICD-10-CM

## 2023-05-28 LAB — BAYER DCA HB A1C WAIVED: HB A1C (BAYER DCA - WAIVED): 5.7 % — ABNORMAL HIGH (ref 4.8–5.6)

## 2023-05-28 MED ORDER — MIRTAZAPINE 15 MG PO TABS: 15.0000 mg | ORAL_TABLET | Freq: Every day | ORAL | 3 refills | Status: DC

## 2023-05-28 MED ORDER — QUETIAPINE FUMARATE 25 MG PO TABS: 25.0000 mg | ORAL_TABLET | Freq: Every day | ORAL | 3 refills | Status: DC

## 2023-05-28 NOTE — Progress Notes (Signed)
Subjective:  Patient ID: [name],  male    DOB: 12-12-1935  Age: 87 y.o.    CC: Medical Management of Chronic Issues   HPI [name] presents for  follow-up of hypertension. Patient has no history of headache chest pain or shortness of breath or recent cough. Patient also denies symptoms of TIA such as numbness weakness lateralizing. Patient denies side effects from medication. States taking it regularly.  Patient also  in for follow-up of elevated cholesterol. Doing well without complaints on current medication. Denies side effects  including myalgia and arthralgia and nausea. Also in today for liver function testing. Currently no chest pain, shortness of breath or other cardiovascular related symptoms noted.  Follow-up of diabetes. Patient does not check blood sugar at home. Patient denies symptoms such as excessive hunger or urinary frequency, excessive hunger, nausea No significant hypoglycemic spells noted. Medications reviewed. Pt reports taking them regularly. Pt. denies complication/adverse reaction today.    History [name] has a past medical history of Anxiety, Asthma, Choledocholithiasis (02/24/2013), COPD (chronic obstructive pulmonary disease) (HCC), CVA (cerebral vascular accident) (HCC) (2013), Dementia (HCC), Diabetes mellitus, TIA (transient ischemic attack), Hyperlipidemia, and Hypertension.   He has a past surgical history that includes Rotator cuff repair (Right); Cholecystectomy (N/A, 02/23/2013); ERCP (N/A, 02/24/2013); and Cataract extraction w/PHACO (Left, 04/30/2016).   His family history includes Diabetes in his sister and sister; Emphysema in his brother; Heart disease in his brother; Hypertension in his father and mother.He reports that he has never smoked. He has never used smokeless tobacco. He reports that he does not drink alcohol and does not use drugs.  Current Outpatient Medications on File Prior to Visit  Medication Sig Dispense Refill    Accu-Chek Softclix Lancets lancets TEST BLOOD SUGAR TWICE DAILY Dx E11.9 200 each 3   albuterol (VENTOLIN HFA) 108 (90 Base) MCG/ACT inhaler Inhale 2 puffs into the lungs every 4 (four) hours as needed for wheezing or shortness of breath. 18 g 5   Alcohol Swabs (DROPSAFE ALCOHOL PREP) 70 % PADS CHECK GLUCOSE TWICE A DAY DX E11.9 200 each 3   aspirin EC 81 MG tablet Take 162 mg by mouth daily.      atorvastatin (LIPITOR) 40 MG tablet Take 1 tablet (40 mg total) by mouth daily. 90 tablet 3   Blood Glucose Calibration (ACCU-CHEK AVIVA) SOLN Use with glucometer Dx E11.9 3 each 0   Blood Glucose Monitoring Suppl (ACCU-CHEK GUIDE) w/Device KIT Check glucose twice a day Dx E11.9 1 kit 0   diclofenac Sodium (VOLTAREN) 1 % GEL Apply 2 g topically 4 (four) times daily. 350 g 1   fluticasone furoate-vilanterol (BREO ELLIPTA) 100-25 MCG/INH AEPB Inhale 1 puff into the lungs daily. 30 each 5   glucose blood (ACCU-CHEK GUIDE) test strip TEST BLOOD SUGAR TWICE DAILY Dx E11.9 200 strip 3   Insulin Pen Needle (PEN NEEDLES) 32G X 4 MM MISC 1 each by Does not apply route 2 (two) times daily. 90 each 3   ipratropium-albuterol (DUONEB) 0.5-2.5 (3) MG/3ML SOLN Take 3 mLs by nebulization every 4 (four) hours as needed. 360 mL 0   losartan (COZAAR) 50 MG tablet Take 1 tablet (50 mg total) by mouth daily. 90 tablet 3   rivastigmine (EXELON) 4.5 MG capsule Take 1 capsule (4.5 mg total) by mouth 2 (two) times daily. For memory 180 capsule 3   sildenafil (REVATIO) 20 MG tablet Take 2-5 pills at once, orally, with each sexual encounter 50 tablet  5   No current facility-administered medications on file prior to visit.    ROS Review of Systems  Constitutional:  Negative for fever.  Respiratory:  Negative for shortness of breath.   Cardiovascular:  Negative for chest pain.  Musculoskeletal:  Negative for arthralgias.  Skin:  Negative for rash.  Psychiatric/Behavioral:  Positive for confusion. Negative for agitation and  behavioral problems. The patient is not nervous/anxious.     Objective:  BP 134/78   Pulse 75   Temp 98.7 F (37.1 C)   Ht 5\' 9"  (1.753 m)   Wt 162 lb 3.2 oz (73.6 kg)   SpO2 99%   BMI 23.95 kg/m   BP Readings from Last 3 Encounters:  05/28/23 134/78  02/25/23 (!) 148/74  11/22/22 125/70    Wt Readings from Last 3 Encounters:  05/28/23 162 lb 3.2 oz (73.6 kg)  02/25/23 162 lb 9.6 oz (73.8 kg)  01/03/23 160 lb (72.6 kg)     Physical Exam Vitals reviewed.  Constitutional:      General: He is not in acute distress.    Appearance: He is well-developed.  HENT:     Head: Normocephalic and atraumatic.     Right Ear: External ear normal.     Left Ear: External ear normal.     Nose: Nose normal.     Mouth/Throat:     Pharynx: No oropharyngeal exudate or posterior oropharyngeal erythema.  Eyes:     Conjunctiva/sclera: Conjunctivae normal.     Pupils: Pupils are equal, round, and reactive to light.  Cardiovascular:     Rate and Rhythm: Normal rate and regular rhythm.     Heart sounds: Normal heart sounds. No murmur heard. Pulmonary:     Effort: Pulmonary effort is normal. No respiratory distress.     Breath sounds: Normal breath sounds. No wheezing or rales.  Abdominal:     Palpations: Abdomen is soft.     Tenderness: There is no abdominal tenderness.  Musculoskeletal:        General: Normal range of motion.     Cervical back: Normal range of motion and neck supple.  Skin:    General: Skin is warm and dry.  Neurological:     Mental Status: He is alert and oriented to person, place, and time.     Deep Tendon Reflexes: Reflexes are normal and symmetric.  Psychiatric:        Behavior: Behavior normal.        Thought Content: Thought content normal.        Judgment: Judgment normal.     Diabetic Foot Exam - Simple   No data filed     Lab Results  Component Value Date   HGBA1C 5.7 (H) 02/25/2023   HGBA1C 5.3 11/22/2022   HGBA1C 5.3 07/23/2022     Assessment & Plan:   [name] was seen today for medical management of chronic issues.  Diagnoses and all orders for this visit:  Dementia without behavioral disturbance (HCC)  Hypertension associated with type 2 diabetes mellitus (HCC) -     CBC with Differential/Platelet -     CMP14+EGFR  Hyperlipidemia associated with type 2 diabetes mellitus (HCC) -     Lipid panel  Type 2 diabetes mellitus with other specified complication, with long-term current use of insulin (HCC) -     Bayer DCA Hb A1c Waived  Other orders -     mirtazapine (REMERON) 15 MG tablet; Take 1 tablet (15 mg total) by mouth  at bedtime. -     QUEtiapine (SEROQUEL) 25 MG tablet; Take 1 tablet (25 mg total) by mouth at bedtime. For sleep and for nerves   I have changed [name]'s mirtazapine. I am also having him maintain his aspirin EC, Pen Needles, diclofenac Sodium, albuterol, Breo Ellipta, ipratropium-albuterol, Accu-Chek Aviva, Accu-Chek Guide, sildenafil, DropSafe Alcohol Prep, Accu-Chek Softclix Lancets, Accu-Chek Guide, atorvastatin, losartan, rivastigmine, and QUEtiapine.  Meds ordered this encounter  Medications   mirtazapine (REMERON) 15 MG tablet    Sig: Take 1 tablet (15 mg total) by mouth at bedtime.    Dispense:  90 tablet    Refill:  3   QUEtiapine (SEROQUEL) 25 MG tablet    Sig: Take 1 tablet (25 mg total) by mouth at bedtime. For sleep and for nerves    Dispense:  90 tablet    Refill:  3     Follow-up: Return in about 4 months (around 09/25/2023) for diabetes.  Mechele Claude, M.D.

## 2023-05-29 LAB — CBC WITH DIFFERENTIAL/PLATELET
Basophils Absolute: 0.1 10*3/uL (ref 0.0–0.2)
Basos: 1 %
EOS (ABSOLUTE): 0.3 10*3/uL (ref 0.0–0.4)
Eos: 4 %
Hematocrit: 37.6 % (ref 37.5–51.0)
Hemoglobin: 12.8 g/dL — ABNORMAL LOW (ref 13.0–17.7)
Immature Grans (Abs): 0 10*3/uL (ref 0.0–0.1)
Immature Granulocytes: 0 %
Lymphocytes Absolute: 1.7 10*3/uL (ref 0.7–3.1)
Lymphs: 23 %
MCH: 30.7 pg (ref 26.6–33.0)
MCHC: 34 g/dL (ref 31.5–35.7)
MCV: 90 fL (ref 79–97)
Monocytes Absolute: 0.7 10*3/uL (ref 0.1–0.9)
Monocytes: 10 %
Neutrophils Absolute: 4.5 10*3/uL (ref 1.4–7.0)
Neutrophils: 62 %
Platelets: 200 10*3/uL (ref 150–450)
RBC: 4.17 x10E6/uL (ref 4.14–5.80)
RDW: 12.7 % (ref 11.6–15.4)
WBC: 7.2 10*3/uL (ref 3.4–10.8)

## 2023-05-29 LAB — CMP14+EGFR
ALT: 11 [IU]/L (ref 0–44)
AST: 14 [IU]/L (ref 0–40)
Albumin: 3.9 g/dL (ref 3.7–4.7)
Alkaline Phosphatase: 105 [IU]/L (ref 44–121)
BUN/Creatinine Ratio: 12 (ref 10–24)
BUN: 18 mg/dL (ref 8–27)
Bilirubin Total: 0.8 mg/dL (ref 0.0–1.2)
CO2: 19 mmol/L — ABNORMAL LOW (ref 20–29)
Calcium: 8.7 mg/dL (ref 8.6–10.2)
Chloride: 106 mmol/L (ref 96–106)
Creatinine, Ser: 1.56 mg/dL — ABNORMAL HIGH (ref 0.76–1.27)
Globulin, Total: 3.5 g/dL (ref 1.5–4.5)
Glucose: 123 mg/dL — ABNORMAL HIGH (ref 70–99)
Potassium: 4.6 mmol/L (ref 3.5–5.2)
Sodium: 141 mmol/L (ref 134–144)
Total Protein: 7.4 g/dL (ref 6.0–8.5)
eGFR: 43 mL/min/{1.73_m2} — ABNORMAL LOW (ref >59)

## 2023-05-29 LAB — LIPID PANEL
Chol/HDL Ratio: 2.4 ratio (ref 0.0–5.0)
Cholesterol, Total: 104 mg/dL (ref 100–199)
HDL: 43 mg/dL (ref >39)
LDL Chol Calc (NIH): 47 mg/dL (ref 0–99)
Triglycerides: 67 mg/dL (ref 0–149)
VLDL Cholesterol Cal: 14 mg/dL (ref 5–40)

## 2023-05-29 NOTE — Progress Notes (Signed)
Hello [name],  Your lab result is normal and/or stable.Some minor variations that are not significant are commonly marked abnormal, but do not represent any medical problem for you.  Best regards, Lanice Folden, M.D.

## 2023-06-03 ENCOUNTER — Other Ambulatory Visit (HOSPITAL_COMMUNITY): Payer: Self-pay

## 2023-06-03 ENCOUNTER — Telehealth: Payer: Self-pay

## 2023-06-03 NOTE — Telephone Encounter (Signed)
*  Primary  Pharmacy Patient Advocate Encounter   Received notification from CoverMyMeds that prior authorization for QUEtiapine Fumarate 25MG  tablets  is required/requested.   Insurance verification completed.   The patient is insured through [place] .   Per test claim: PA required; PA submitted to above mentioned insurance via CoverMyMeds Key/confirmation #/EOC [id number] Status is pending

## 2023-06-04 ENCOUNTER — Other Ambulatory Visit (HOSPITAL_COMMUNITY): Payer: Self-pay

## 2023-06-04 NOTE — Telephone Encounter (Signed)
Pharmacy Patient Advocate Encounter  Received notification from [facility] that Prior Authorization for QUEtiapine Fumarate 25MG  tablets  has been APPROVED from 07/09/22 to 07/08/24. Unable to obtain price due to refill too soon rejection, last fill date 06/04/23 next available fill date2/9/25   PA #/Case ID/Reference #:  [id number]

## 2023-09-25 ENCOUNTER — Encounter: Payer: Self-pay | Admitting: Family Medicine

## 2023-09-25 ENCOUNTER — Ambulatory Visit (INDEPENDENT_AMBULATORY_CARE_PROVIDER_SITE_OTHER): Payer: Medicare PPO | Admitting: Family Medicine

## 2023-09-25 VITALS — BP 141/73 | HR 81 | Temp 98.3°F | Ht 69.0 in | Wt 158.0 lb

## 2023-09-25 DIAGNOSIS — Z794 Long term (current) use of insulin: Secondary | ICD-10-CM | POA: Diagnosis not present

## 2023-09-25 DIAGNOSIS — E785 Hyperlipidemia, unspecified: Secondary | ICD-10-CM | POA: Diagnosis not present

## 2023-09-25 DIAGNOSIS — E1159 Type 2 diabetes mellitus with other circulatory complications: Secondary | ICD-10-CM | POA: Diagnosis not present

## 2023-09-25 DIAGNOSIS — I152 Hypertension secondary to endocrine disorders: Secondary | ICD-10-CM

## 2023-09-25 DIAGNOSIS — E1169 Type 2 diabetes mellitus with other specified complication: Secondary | ICD-10-CM

## 2023-09-25 LAB — LIPID PANEL

## 2023-09-25 LAB — BAYER DCA HB A1C WAIVED: HB A1C (BAYER DCA - WAIVED): 5.1 % (ref 4.8–5.6)

## 2023-09-25 MED ORDER — RIVASTIGMINE TARTRATE 6 MG PO CAPS: 6.0000 mg | ORAL_CAPSULE | Freq: Two times a day (BID) | ORAL | 3 refills | Status: DC

## 2023-09-25 MED ORDER — TRIAMCINOLONE ACETONIDE 0.1 % EX CREA: 1.0000 | TOPICAL_CREAM | Freq: Three times a day (TID) | CUTANEOUS | 2 refills | Status: DC

## 2023-09-25 NOTE — Progress Notes (Signed)
 Subjective:  Patient ID: [name], male    DOB: Nov 09, 1935  Age: 88 y.o. MRN: [id number]  CC: Diabetes, Fall (Pt fell last Thursday and skinned his knee and hurt right hand. Pt was walking up the road. Bruised right cheek. ), and Dementia (Would like to discuss stages of dementia)   HPI [name] presents for presents forFollow-up of diabetes. Patient checks blood sugar at home. 80-110 Patient denies symptoms such as polyuria, polydipsia, excessive hunger, nausea No significant hypoglycemic spells noted. Medications reviewed. Pt reports taking them regularly without complication/adverse reaction being reported today.  Lab Results  Component Value Date   HGBA1C 5.1 09/25/2023   HGBA1C 5.7 (H) 05/28/2023   HGBA1C 5.7 (H) 02/25/2023    presents for  follow-up of hypertension. Patient has no history of headache chest pain or shortness of breath or recent cough. Patient also denies symptoms of TIA such as focal numbness or weakness. Patient denies side effects from medication. States taking it regularly.   in for follow-up of elevated cholesterol. Doing well without complaints on current medication. Denies side effects of statin including myalgia and arthralgia and nausea. Currently no chest pain, shortness of breath or other cardiovascular related symptoms noted.    Rash on back and left forearm. Chronic recurring. Pruritic.      09/25/2023   10:26 AM 05/28/2023    2:29 PM 05/28/2023    2:01 PM  Depression screen PHQ 2/9  Decreased Interest 0 1 0  Down, Depressed, Hopeless 0 0 0  PHQ - 2 Score 0 1 0  Altered sleeping 0 1   Tired, decreased energy 1 1   Change in appetite  0   Feeling bad or failure about yourself  0 0   Trouble concentrating 0 1   Moving slowly or fidgety/restless 0 0   Suicidal thoughts 0 0   PHQ-9 Score 1 4   Difficult doing work/chores Not difficult at all Somewhat difficult     History [name] has a past medical history of Anxiety, Asthma,  Choledocholithiasis (02/24/2013), COPD (chronic obstructive pulmonary disease) (HCC), CVA (cerebral vascular accident) (HCC) (2013), Dementia (HCC), Diabetes mellitus, TIA (transient ischemic attack), Hyperlipidemia, and Hypertension.   He has a past surgical history that includes Rotator cuff repair (Right); Cholecystectomy (N/A, 02/23/2013); ERCP (N/A, 02/24/2013); and Cataract extraction w/PHACO (Left, 04/30/2016).   His family history includes Diabetes in his sister and sister; Emphysema in his brother; Heart disease in his brother; Hypertension in his father and mother.He reports that he has never smoked. He has never used smokeless tobacco. He reports that he does not drink alcohol and does not use drugs.    ROS Review of Systems  Constitutional:  Negative for fever.  Respiratory:  Negative for shortness of breath.   Cardiovascular:  Negative for chest pain.  Musculoskeletal:  Negative for arthralgias.  Skin:  Negative for rash.    Objective:  BP (!) 141/73   Pulse 81   Temp 98.3 F (36.8 C)   Ht 5\' 9"  (1.753 m)   Wt 158 lb (71.7 kg)   SpO2 96%   BMI 23.33 kg/m   BP Readings from Last 3 Encounters:  09/25/23 (!) 141/73  05/28/23 134/78  02/25/23 (!) 148/74    Wt Readings from Last 3 Encounters:  09/25/23 158 lb (71.7 kg)  05/28/23 162 lb 3.2 oz (73.6 kg)  02/25/23 162 lb 9.6 oz (73.8 kg)     Physical Exam Vitals reviewed.  Constitutional:  Appearance: He is well-developed.  HENT:     Head: Normocephalic and atraumatic.     Right Ear: External ear normal.     Left Ear: External ear normal.     Mouth/Throat:     Pharynx: No oropharyngeal exudate or posterior oropharyngeal erythema.  Eyes:     Pupils: Pupils are equal, round, and reactive to light.  Cardiovascular:     Rate and Rhythm: Normal rate and regular rhythm.     Heart sounds: No murmur heard. Pulmonary:     Effort: No respiratory distress.     Breath sounds: Normal breath sounds.   Musculoskeletal:     Cervical back: Normal range of motion and neck supple.  Neurological:     Mental Status: He is alert and oriented to person, place, and time.       Assessment & Plan:   Naod was seen today for diabetes, fall and dementia.  Diagnoses and all orders for this visit:  Type 2 diabetes mellitus with other specified complication, with long-term current use of insulin (HCC) -     Bayer DCA Hb A1c Waived  Hyperlipidemia associated with type 2 diabetes mellitus (HCC) -     Lipid panel  Hypertension associated with type 2 diabetes mellitus (HCC) -     CBC with Differential/Platelet -     CMP14+EGFR  Other orders -     triamcinolone cream (KENALOG) 0.1 %; Apply 1 Application topically 3 (three) times daily. Avoid face and genitalia -     rivastigmine (EXELON) 6 MG capsule; Take 1 capsule (6 mg total) by mouth 2 (two) times daily. For memory       I have discontinued [name]'s Pen Needles, DropSafe Alcohol Prep, Accu-Chek Softclix Lancets, and mirtazapine. I have also changed his rivastigmine. Additionally, I am having him start on triamcinolone cream. Lastly, I am having him maintain his aspirin EC, diclofenac Sodium, albuterol, Breo Ellipta, ipratropium-albuterol, Accu-Chek Aviva, Accu-Chek Guide, sildenafil, atorvastatin, losartan, and QUEtiapine.  Allergies as of 09/25/2023       Reactions   Ace Inhibitors Swelling        Medication List        Accurate as of September 25, 2023  8:39 PM. If you have any questions, ask your nurse or doctor.          STOP taking these medications    Accu-Chek Guide w/Device Kit Stopped by: Osha Rane   Accu-Chek Softclix Lancets lancets Stopped by: Broadus John Namish Krise   DropSafe Alcohol Prep 70 % Pads Stopped by: Pegge Cumberledge   mirtazapine 15 MG tablet Commonly known as: REMERON Stopped by: Maggy Wyble   Pen Needles 32G X 4 MM Misc Stopped by: Brenee Gajda       TAKE these medications     Accu-Chek Aviva Soln Use with glucometer Dx E11.9   Accu-Chek Guide test strip Generic drug: glucose blood TEST BLOOD SUGAR TWICE DAILY Dx E11.9   albuterol 108 (90 Base) MCG/ACT inhaler Commonly known as: VENTOLIN HFA Inhale 2 puffs into the lungs every 4 (four) hours as needed for wheezing or shortness of breath.   aspirin EC 81 MG tablet Take 162 mg by mouth daily.   atorvastatin 40 MG tablet Commonly known as: LIPITOR Take 1 tablet (40 mg total) by mouth daily.   Breo Ellipta 100-25 MCG/INH Aepb Generic drug: fluticasone furoate-vilanterol Inhale 1 puff into the lungs daily.   diclofenac Sodium 1 % Gel Commonly known as: Voltaren Apply 2 g  topically 4 (four) times daily.   ipratropium-albuterol 0.5-2.5 (3) MG/3ML Soln Commonly known as: DUONEB Take 3 mLs by nebulization every 4 (four) hours as needed.   losartan 50 MG tablet Commonly known as: COZAAR Take 1 tablet (50 mg total) by mouth daily.   QUEtiapine 25 MG tablet Commonly known as: SEROQUEL Take 1 tablet (25 mg total) by mouth at bedtime. For sleep and for nerves   rivastigmine 6 MG capsule Commonly known as: EXELON Take 1 capsule (6 mg total) by mouth 2 (two) times daily. For memory What changed:  medication strength how much to take Changed by: Denetra Formoso   sildenafil 20 MG tablet Commonly known as: REVATIO Take 2-5 pills at once, orally, with each sexual encounter   triamcinolone cream 0.1 % Commonly known as: KENALOG Apply 1 Application topically 3 (three) times daily. Avoid face and genitalia Started by: Broadus John Shailynn Fong         Follow-up: Return in about 3 months (around 12/26/2023).  Mechele Claude, M.D.

## 2023-09-26 LAB — CBC WITH DIFFERENTIAL/PLATELET
Basophils Absolute: 0.1 10*3/uL (ref 0.0–0.2)
Basos: 1 %
EOS (ABSOLUTE): 0.5 10*3/uL — ABNORMAL HIGH (ref 0.0–0.4)
Eos: 7 %
Hematocrit: 36.6 % — ABNORMAL LOW (ref 37.5–51.0)
Hemoglobin: 12.5 g/dL — ABNORMAL LOW (ref 13.0–17.7)
Immature Grans (Abs): 0 10*3/uL (ref 0.0–0.1)
Immature Granulocytes: 0 %
Lymphocytes Absolute: 1.7 10*3/uL (ref 0.7–3.1)
Lymphs: 25 %
MCH: 30.6 pg (ref 26.6–33.0)
MCHC: 34.2 g/dL (ref 31.5–35.7)
MCV: 90 fL (ref 79–97)
Monocytes Absolute: 0.5 10*3/uL (ref 0.1–0.9)
Monocytes: 8 %
Neutrophils Absolute: 4 10*3/uL (ref 1.4–7.0)
Neutrophils: 59 %
Platelets: 229 10*3/uL (ref 150–450)
RBC: 4.08 x10E6/uL — ABNORMAL LOW (ref 4.14–5.80)
RDW: 12.6 % (ref 11.6–15.4)
WBC: 6.7 10*3/uL (ref 3.4–10.8)

## 2023-09-26 LAB — CMP14+EGFR
ALT: 10 IU/L (ref 0–44)
AST: 17 IU/L (ref 0–40)
Albumin: 4 g/dL (ref 3.7–4.7)
Alkaline Phosphatase: 102 IU/L (ref 44–121)
BUN/Creatinine Ratio: 13 (ref 10–24)
BUN: 17 mg/dL (ref 8–27)
Bilirubin Total: 0.4 mg/dL (ref 0.0–1.2)
CO2: 18 mmol/L — ABNORMAL LOW (ref 20–29)
Calcium: 8.7 mg/dL (ref 8.6–10.2)
Chloride: 109 mmol/L — ABNORMAL HIGH (ref 96–106)
Creatinine, Ser: 1.26 mg/dL (ref 0.76–1.27)
Globulin, Total: 3.3 g/dL (ref 1.5–4.5)
Glucose: 124 mg/dL — ABNORMAL HIGH (ref 70–99)
Potassium: 3.8 mmol/L (ref 3.5–5.2)
Sodium: 144 mmol/L (ref 134–144)
Total Protein: 7.3 g/dL (ref 6.0–8.5)
eGFR: 56 mL/min/{1.73_m2} — ABNORMAL LOW (ref >59)

## 2023-09-26 LAB — LIPID PANEL
Cholesterol, Total: 80 mg/dL — ABNORMAL LOW (ref 100–199)
HDL: 37 mg/dL — ABNORMAL LOW (ref >39)
LDL CALC COMMENT:: 2.2 ratio (ref 0.0–5.0)
LDL Chol Calc (NIH): 26 mg/dL (ref 0–99)
Triglycerides: 85 mg/dL (ref 0–149)
VLDL Cholesterol Cal: 17 mg/dL (ref 5–40)

## 2023-10-04 ENCOUNTER — Telehealth: Payer: Self-pay

## 2023-10-04 NOTE — Telephone Encounter (Signed)
Noted  -LS

## 2023-10-04 NOTE — Telephone Encounter (Signed)
 Copied from CRM [id number]. Topic: Clinical - Lab/Test Results >> Oct 04, 2023 11:23 AM Fuller Mandril wrote: Reason for CRM: Patient wife returned missed call. Call was for labs. Read note as written by provider. Patient understood. No Further questions at this time Thank You.

## 2023-11-11 DIAGNOSIS — H11001 Unspecified pterygium of right eye: Secondary | ICD-10-CM | POA: Diagnosis not present

## 2023-11-11 DIAGNOSIS — Z961 Presence of intraocular lens: Secondary | ICD-10-CM | POA: Diagnosis not present

## 2023-11-11 DIAGNOSIS — H02102 Unspecified ectropion of right lower eyelid: Secondary | ICD-10-CM | POA: Diagnosis not present

## 2023-11-11 DIAGNOSIS — H2511 Age-related nuclear cataract, right eye: Secondary | ICD-10-CM | POA: Diagnosis not present

## 2023-12-26 ENCOUNTER — Ambulatory Visit: Admitting: Family Medicine

## 2023-12-26 ENCOUNTER — Ambulatory Visit: Payer: Self-pay | Admitting: Family Medicine

## 2023-12-26 ENCOUNTER — Encounter: Payer: Self-pay | Admitting: Family Medicine

## 2023-12-26 VITALS — BP 100/61 | HR 70 | Temp 98.2°F | Ht 69.0 in | Wt 149.6 lb

## 2023-12-26 DIAGNOSIS — E785 Hyperlipidemia, unspecified: Secondary | ICD-10-CM | POA: Diagnosis not present

## 2023-12-26 DIAGNOSIS — E1159 Type 2 diabetes mellitus with other circulatory complications: Secondary | ICD-10-CM | POA: Diagnosis not present

## 2023-12-26 DIAGNOSIS — Z794 Long term (current) use of insulin: Secondary | ICD-10-CM

## 2023-12-26 DIAGNOSIS — E1169 Type 2 diabetes mellitus with other specified complication: Secondary | ICD-10-CM

## 2023-12-26 DIAGNOSIS — I152 Hypertension secondary to endocrine disorders: Secondary | ICD-10-CM | POA: Diagnosis not present

## 2023-12-26 LAB — BAYER DCA HB A1C WAIVED: HB A1C (BAYER DCA - WAIVED): 5.2 % (ref 4.8–5.6)

## 2023-12-26 MED ORDER — TADALAFIL 20 MG PO TABS: 10.0000 mg | ORAL_TABLET | ORAL | 11 refills | Status: DC | PRN

## 2023-12-26 MED ORDER — MEMANTINE HCL 5 MG PO TABS: 5.0000 mg | ORAL_TABLET | Freq: Two times a day (BID) | ORAL | 1 refills | Status: DC

## 2023-12-26 MED ORDER — RIVASTIGMINE TARTRATE 6 MG PO CAPS: 6.0000 mg | ORAL_CAPSULE | Freq: Two times a day (BID) | ORAL | 3 refills | Status: DC

## 2023-12-26 NOTE — Progress Notes (Unsigned)
 Subjective:  Patient ID: [name],  male    DOB: Jan 10, 1936  Age: 88 y.o.    CC: Medical Management of Chronic Issues (3 month)   HPI STONEY KARCZEWSKI presents for  follow-up of hypertension. Patient has no history of headache chest pain or shortness of breath or recent cough. Patient also denies symptoms of TIA such as numbness weakness lateralizing. Patient denies side effects from medication. States taking it regularly.  Patient also  in for follow-up of elevated cholesterol. Doing well without complaints on current medication. Denies side effects  including myalgia and arthralgia and nausea. Also in today for liver function testing. Currently no chest pain, shortness of breath or other cardiovascular related symptoms noted.  Follow-up of diabetes. Patient does check blood sugar at home. Readings run between 100 and 150 fasting Patient denies symptoms such as excessive hunger or urinary frequency, excessive hunger, nausea No significant hypoglycemic spells noted. Medications reviewed. Pt reports taking them regularly. Pt. denies complication/adverse reaction today.    History Jabez has a past medical history of Anxiety, Asthma, Choledocholithiasis (02/24/2013), COPD (chronic obstructive pulmonary disease) (HCC), CVA (cerebral vascular accident) (HCC) (2013), Dementia (HCC), Diabetes mellitus, TIA (transient ischemic attack), Hyperlipidemia, and Hypertension.   He has a past surgical history that includes Rotator cuff repair (Right); Cholecystectomy (N/A, 02/23/2013); ERCP (N/A, 02/24/2013); and Cataract extraction w/PHACO (Left, 04/30/2016).   His family history includes Diabetes in his sister and sister; Emphysema in his brother; Heart disease in his brother; Hypertension in his father and mother.He reports that he has never smoked. He has never used smokeless tobacco. He reports that he does not drink alcohol and does not use drugs.  Current Outpatient Medications on File Prior to  Visit  Medication Sig Dispense Refill   albuterol  (VENTOLIN  HFA) 108 (90 Base) MCG/ACT inhaler Inhale 2 puffs into the lungs every 4 (four) hours as needed for wheezing or shortness of breath. 18 g 5   aspirin  EC 81 MG tablet Take 162 mg by mouth daily.      atorvastatin  (LIPITOR) 40 MG tablet Take 1 tablet (40 mg total) by mouth daily. 90 tablet 3   Blood Glucose Calibration (ACCU-CHEK AVIVA) SOLN Use with glucometer Dx E11.9 3 each 0   diclofenac  Sodium (VOLTAREN ) 1 % GEL Apply 2 g topically 4 (four) times daily. 350 g 1   fluticasone  furoate-vilanterol (BREO ELLIPTA ) 100-25 MCG/INH AEPB Inhale 1 puff into the lungs daily. 30 each 5   glucose blood (ACCU-CHEK GUIDE) test strip TEST BLOOD SUGAR TWICE DAILY Dx E11.9 200 strip 3   ipratropium-albuterol  (DUONEB) 0.5-2.5 (3) MG/3ML SOLN Take 3 mLs by nebulization every 4 (four) hours as needed. 360 mL 0   losartan  (COZAAR ) 50 MG tablet Take 1 tablet (50 mg total) by mouth daily. 90 tablet 3   QUEtiapine  (SEROQUEL ) 25 MG tablet Take 1 tablet (25 mg total) by mouth at bedtime. For sleep and for nerves 90 tablet 3   triamcinolone  cream (KENALOG ) 0.1 % Apply 1 Application topically 3 (three) times daily. Avoid face and genitalia 45 g 2   No current facility-administered medications on file prior to visit.    ROS Review of Systems  Constitutional: Negative.   HENT: Negative.    Eyes:  Negative for visual disturbance.  Respiratory:  Negative for cough and shortness of breath.   Cardiovascular:  Negative for chest pain and leg swelling.  Gastrointestinal:  Negative for abdominal pain, diarrhea, nausea and vomiting.  Genitourinary:  Negative  for difficulty urinating.       Wants something to help him have an erection   Musculoskeletal:  Negative for arthralgias and myalgias.  Skin:  Negative for rash.  Neurological:  Negative for headaches.  Psychiatric/Behavioral:  Negative for sleep disturbance.     Objective:  BP 100/61   Pulse 70   Temp  98.2 F (36.8 C)   Ht 5' 9 (1.753 m)   Wt 149 lb 9.6 oz (67.9 kg)   SpO2 95%   BMI 22.09 kg/m   BP Readings from Last 3 Encounters:  12/26/23 100/61  09/25/23 (!) 141/73  05/28/23 134/78    Wt Readings from Last 3 Encounters:  12/26/23 149 lb 9.6 oz (67.9 kg)  09/25/23 158 lb (71.7 kg)  05/28/23 162 lb 3.2 oz (73.6 kg)    Lab Results  Component Value Date   HGBA1C 5.1 09/25/2023   HGBA1C 5.7 (H) 05/28/2023   HGBA1C 5.7 (H) 02/25/2023    Physical Exam  {Perform Simple Foot Exam  Perform Detailed exam:1} {Insert foot Exam (Optional):30965}   Assessment & Plan:  Type 2 diabetes mellitus with other specified complication, with long-term current use of insulin  (HCC) -     Bayer DCA Hb A1c Waived -     Microalbumin / creatinine urine ratio  Hyperlipidemia associated with type 2 diabetes mellitus (HCC)  Hypertension associated with type 2 diabetes mellitus (HCC)  Other orders -     Rivastigmine  Tartrate; Take 1 capsule (6 mg total) by mouth 2 (two) times daily. For memory  Dispense: 180 capsule; Refill: 3 -     Memantine  HCl; Take 1 tablet (5 mg total) by mouth 2 (two) times daily.  Dispense: 180 tablet; Refill: 1 -     Tadalafil; Take 0.5-1 tablets (10-20 mg total) by mouth every other day as needed for erectile dysfunction.  Dispense: 10 tablet; Refill: 11    Follow-up: Return in about 3 months (around 03/27/2024).  Roise Cleaver, M.D.

## 2024-01-08 ENCOUNTER — Ambulatory Visit: Payer: Self-pay

## 2024-01-08 NOTE — Telephone Encounter (Signed)
 FYI Only or Action Required?: Action required by provider: recommendation from PCP.  Patient was last seen in primary care on 12/26/2023 by Zollie Lowers, MD. Called Nurse Triage reporting Hallucinations. Symptoms began several days ago. Interventions attempted: Rest, hydration, or home remedies. Symptoms are: gradually worsening.  Triage Disposition: See HCP Within 4 Hours (Or PCP Triage)  Patient/caregiver understands and will follow disposition?: No, wishes to speak with PCP  Copied from CRM [id number]. Topic: Clinical - Red Word Triage >> Jan 08, 2024  2:14 PM Kevelyn M wrote: Red Word that prompted transfer to Nurse Triage: Patient has dementia and the patient's wife Joya) would like the patient to get tested. Patient is hallucinating a lot more frequently and she is concerned. Reason for Disposition  [1] Longstanding confusion (e.g., dementia, stroke) AND [2] worsening  Answer Assessment - Initial Assessment Questions 1. LEVEL OF CONSCIOUSNESS: How is he (she, the patient) acting right now? (e.g., alert-oriented, confused, lethargic, stuporous, comatose)     Confused, paranoid 2. ONSET: When did the confusion start?  (minutes, hours, days)     Increased in the last two weeks.  3. PATTERN Does this come and go, or has it been constant since it started?  Is it present now?     constant 4. ALCOHOL or DRUGS: Has he been drinking alcohol or taking any drugs?      no 5. NARCOTIC MEDICINES: Has he been receiving any narcotic medications? (e.g., morphine , Vicodin)     no 6. CAUSE: What do you think is causing the confusion?      Hx of Dementia 7. OTHER SYMPTOMS: Are there any other symptoms? (e.g., difficulty breathing, headache, fever, weakness)     No  Wife states patient is having hallucinations-wife endorses that patient started yelling about someone is trying to shoot him last night. Per wife report, patient is Alert and oriented x self. Wife doesn't have any current  safety concerns but is wanting some assistance from PCP.  Protocols used: Confusion - Delirium-A-AH

## 2024-01-16 ENCOUNTER — Other Ambulatory Visit: Payer: Self-pay | Admitting: Family Medicine

## 2024-01-16 DIAGNOSIS — E1159 Type 2 diabetes mellitus with other circulatory complications: Secondary | ICD-10-CM

## 2024-01-16 DIAGNOSIS — E1169 Type 2 diabetes mellitus with other specified complication: Secondary | ICD-10-CM

## 2024-02-05 ENCOUNTER — Ambulatory Visit: Payer: Self-pay | Admitting: *Deleted

## 2024-02-05 NOTE — Telephone Encounter (Signed)
 Apt scheduled.

## 2024-02-05 NOTE — Telephone Encounter (Signed)
 Copied from CRM [id number]. Topic: Clinical - Red Word Triage >> Feb 05, 2024  8:54 AM Antwanette L wrote: Red Word that prompted transfer to Nurse Triage: Pt  Has an infection on left big toes. Pt can barely walk and its causing pain Reason for Disposition  [1] MODERATE pain (e.g., interferes with normal activities, limping) AND [2] present > 3 days  Answer Assessment - Initial Assessment Questions 1. ONSET: When did the pain start?      Rock, wife calling in. Started last week.   C/O his toe being sore.   I looked at it today.  It has pus coming out of it.   He had diabetes but he lost a lot of weight and no longer a diabetic.   2. LOCATION: Where is the pain located?   (e.g., around nail, entire toe, at foot joint)      Infection in big toe on left foot.   3. PAIN: How bad is the pain?    (Scale 1-10; or mild, moderate, severe)     It's red and swollen on top with pus draining.    He can walk but hurts to wear a shoe. 4. APPEARANCE: What does the toe look like? (e.g., redness, swelling, bruising, pallor)     See above 5. CAUSE: What do you think is causing the toe pain?     Infection 6. OTHER SYMPTOMS: Do you have any other symptoms? (e.g., leg pain, rash, fever, numbness)     No other symptoms 7. PREGNANCY: Is there any chance you are pregnant? When was your last menstrual period?     N/A  Protocols used: Toe Pain-A-AH FYI Only or Action Required?: FYI only for provider.  Patient was last seen in primary care on 12/26/2023 by Zollie Lowers, MD.  Called Nurse Triage reporting Toe Pain. Left big toe is red, swollen and draining pus.  Wife noticed this morning 7/30.     Symptoms began a week ago. Pt c/o pain last week.  Interventions attempted: Nothing.  Symptoms are: gradually worsening.  Triage Disposition: See PCP When Office is Open (Within 3 Days)  Patient/caregiver understands and will follow disposition?: Yes

## 2024-02-06 ENCOUNTER — Encounter: Payer: Self-pay | Admitting: Nurse Practitioner

## 2024-02-06 ENCOUNTER — Ambulatory Visit (INDEPENDENT_AMBULATORY_CARE_PROVIDER_SITE_OTHER): Admitting: Nurse Practitioner

## 2024-02-06 VITALS — BP 132/84 | HR 101 | Temp 98.0°F | Ht 69.0 in | Wt 148.0 lb

## 2024-02-06 DIAGNOSIS — L03032 Cellulitis of left toe: Secondary | ICD-10-CM | POA: Diagnosis not present

## 2024-02-06 MED ORDER — SULFAMETHOXAZOLE-TRIMETHOPRIM 800-160 MG PO TABS: 1.0000 | ORAL_TABLET | Freq: Two times a day (BID) | ORAL | 0 refills | Status: DC

## 2024-02-06 NOTE — Patient Instructions (Signed)
 Paronychia Paronychia is an infection of the skin. It happens near a fingernail or toenail. It may cause pain and swelling around the nail. In some cases, a fluid-filled bump (abscess) can form near or under the nail. Often, this condition is not serious, and it clears up with treatment. What are the causes? This condition may be caused by a germ. The germ may be bacteria or a fungus. These germs can enter the body through an opening in the skin, such as a cut or a hangnail. Other causes include: Repeated injuries to your fingernails or toenails. Irritation of the base and sides of the nail (cuticle). What increases the risk? This condition is more likely to develop in people who: Get their hands wet often, such as a dishwasher. Bite their fingernails or the base and sides of their nails. Have other skin problems. Have hangnails or hurt fingertips. Come into contact with chemicals like detergents. Have diabetes. What are the signs or symptoms? Redness and swelling of the skin near the nail. A tender feeling around the nail. Pus-filled bumps under the skin at the base and sides of the nail. Fluid or pus under the nail. Pain in the area. How is this treated? Treatment depends on the cause of your condition and how bad it is. If your condition is mild, it may clear up on its own in a few days or after soaking in warm water. If needed, treatment may include: Antibiotic medicine. Antifungal medicine. A procedure to drain pus from a fluid-filled bump. Medicine to treat irritation and swelling (corticosteroids). Taking off part of an ingrown toenail. A bandage (dressing) may be placed over the nail area. Follow these instructions at home: Wound care Keep the affected area clean. Soak the fingers or toes in warm water as told by your doctor. You may be told to do this for 20 minutes, 2-3 times a day. Keep the area dry when you are not soaking it. Do not try to drain a fluid-filled bump on  your own. Follow instructions from your doctor about how to take care of the affected area. Make sure you: Wash your hands with soap and water for at least 20 seconds before and after you change your bandage. If you cannot use soap and water, use hand sanitizer. Change your bandage as told by your doctor. If you had a fluid-filled bump and your doctor drained it, check the area every day for signs of infection. Check for: Redness, swelling, or pain. Fluid or blood. Warmth. Pus or a bad smell. Medicines  Take over-the-counter and prescription medicines only as told by your doctor. If you were prescribed an antibiotic medicine, take it as told by your doctor. Do not stop taking it even if you start to feel better. General instructions Avoid contact with anything that irritates your skin or that you are allergic to. Do not pick at the affected area. Keep all follow-up visits. Prevention To prevent this condition from happening again: Wear rubber gloves when putting your hands in water for washing dishes or other tasks. Wear gloves if your hands might touch cleaners or chemicals. Avoid injuring your nails or fingertips. Do not bite your nails or tear hangnails. Do not cut your nails very short. Do not cut the skin at the base and sides of the nail. Use clean nail clippers or scissors when trimming nails. Contact a doctor if: You feel worse. You do not get better. You keep having or you have more fluid, blood, or  pus coming from the affected area. Your affected finger, toe, or joint gets swollen or hard to move. You have a fever or chills. There is redness spreading from the affected area. Summary Paronychia is an infection of the skin. It happens near a fingernail or toenail. This condition may cause pain and swelling around the nail. Soak the fingers or toes in warm water as told by your doctor. Often, this condition is not serious, and it clears up with treatment. This information  is not intended to replace advice given to you by your health care provider. Make sure you discuss any questions you have with your health care provider. Document Revised: 09/26/2020 Document Reviewed: 09/26/2020 Elsevier Patient Education  2024 [employer].

## 2024-02-06 NOTE — Progress Notes (Signed)
   Subjective:    Patient ID: COLONEL KRAUSER, male    DOB: Oct 20, 1935, 88 y.o.   MRN: [id number]   Chief Complaint: Left toe swollen   HPI  Patient comes in c/o left toe swelling. Started about 2 days ago. Very sore to touch. No noticeable drainage.  Patient Active Problem List   Diagnosis Date Noted   Asthma    COPD (chronic obstructive pulmonary disease) (HCC)    Hyperlipidemia associated with type 2 diabetes mellitus (HCC) 11/27/2018   Intrinsic asthma 03/31/2015   Dementia without behavioral disturbance (HCC)    Atelectasis of right lung 01/29/2014   Pleural effusion on right 11/24/2013   Late effect of cerebrovascular accident (CVA)    Diabetes mellitus (HCC)    Hypertension associated with type 2 diabetes mellitus (HCC)    Hx-TIA (transient ischemic attack)        Review of Systems  Constitutional:  Negative for diaphoresis.  Eyes:  Negative for pain.  Respiratory:  Negative for shortness of breath.   Cardiovascular:  Negative for chest pain, palpitations and leg swelling.  Gastrointestinal:  Negative for abdominal pain.  Endocrine: Negative for polydipsia.  Skin:  Negative for rash.  Neurological:  Negative for dizziness, weakness and headaches.  Hematological:  Does not bruise/bleed easily.  All other systems reviewed and are negative.      Objective:   Physical Exam Constitutional:      Appearance: Normal appearance.  Cardiovascular:     Rate and Rhythm: Normal rate and regular rhythm.     Heart sounds: Normal heart sounds.  Pulmonary:     Breath sounds: Normal breath sounds.  Skin:    General: Skin is warm.     Comments: Left big toe pale and sore around nail bed. Mild edema.  Neurological:     General: No focal deficit present.     Mental Status: He is alert and oriented to person, place, and time.  Psychiatric:        Mood and Affect: Mood normal.        Behavior: Behavior normal.    BP 132/84   Pulse (!) 101   Temp 98 F (36.7 C) (Temporal)    Ht 5' 9 (1.753 m)   Wt 148 lb (67.1 kg)   SpO2 97%   BMI 21.86 kg/m         Assessment & Plan:   Ryan LITTIE Cook in today with chief complaint of Left toe swollen   1. Paronychia of great toe of left foot (Primary) Soak in epsom salt BID Elevate when sitting Meds ordered this encounter  Medications   sulfamethoxazole -trimethoprim  (BACTRIM  DS) 800-160 MG tablet    Sig: Take 1 tablet by mouth 2 (two) times daily.    Dispense:  20 tablet    Refill:  0    Supervising Provider:   MARYANNE CHEW A [[id number]]   Mary-Margaret Gladis, [name]     The above assessment and management plan was discussed with the patient. The patient verbalized understanding of and has agreed to the management plan. Patient is aware to call the clinic if symptoms persist or worsen. Patient is aware when to return to the clinic for a follow-up visit. Patient educated on when it is appropriate to go to the emergency department.   Mary-Margaret Gladis, [name]

## 2024-03-13 ENCOUNTER — Ambulatory Visit: Payer: Self-pay | Admitting: *Deleted

## 2024-03-13 NOTE — Telephone Encounter (Signed)
 Noted

## 2024-03-13 NOTE — Telephone Encounter (Signed)
 FYI Only or Action Required?: FYI only for provider.  Patient was last seen in primary care on 02/06/2024 by Kenneth Mustard, [name].  Called Nurse Triage reporting Nail Problem.  Symptoms began several weeks ago.  Interventions attempted: Other: recent antibiotic treatment, soaking.  Symptoms are: unchanged.  Triage Disposition: See PCP When Office is Open (Within 3 Days)  Patient/caregiver understands and will follow disposition?: Yes- declines Monday appointment- requesting PCP only    Reason for Disposition  [1] Taking antibiotic > 72 hours (3 days) AND [2] wound infection symptoms are SAME (not getting better)  Answer Assessment - Initial Assessment Questions 1. SYMPTOM: What's the main symptom you're concerned about? (e.g., redness, swelling, pain, fever, weakness)     Drainage from the toe nail, sore 2. WOUND INFECTION LOCATION: Where is the wound infection located? (e.g., arm, face, foot, knee, leg)     Left- great toe 3. WOUND INFECTION SIZE: What is the size of the red area? (e.g., inches, centimeters; compare to size of a coin)      Drainage from the nail- soaking in epsom salt as directed 4. BETTER-SAME-WORSE: Are you getting better, staying the same, or getting worse compared to the day you started the antibiotics?      Same/worse 5. PAIN: Do you have any pain?  If Yes, ask: How bad is the pain?  (e.g., Scale 1-10; mild, moderate, or severe)     Yes- moderate 6. FEVER: Do you have a fever? If Yes, ask: What is it, how was it measured and when did it start?     no 7. OTHER SYMPTOMS: Do you have any other symptoms? (e.g., pus coming from a wound, red streaks, weakness)     Drainage from nail 8. DIAGNOSIS DATE: When was the wound infection diagnosed? By whom?      02/06/24 9. ANTIBIOTIC NAME: What antibiotic(s) are you taking?  How many times a day? Note: Be sure the patient is taking the antibiotic as directed.       Sulfamethoxazole -Trimethoprim  800-160 MG 1 tablet Oral 2 times daily 10. ANTIBIOTIC DATE: When was the antibiotic started?       02/06/24 11. FOLLOW-UP APPOINTMENT: Do you have a follow-up appointment with your doctor?       Yes- requesting sooner appointment  Protocols used: Wound Infection on Antibiotic Follow-up Call-A-AH   Copied from CRM #[id number]. Topic: Clinical - Red Word Triage >> Mar 13, 2024 11:48 AM Avram MATSU wrote: Red Word that prompted transfer to Nurse Triage: infection in toe, wife does not know the name of the antibiotic.

## 2024-03-17 ENCOUNTER — Encounter: Payer: Self-pay | Admitting: Family Medicine

## 2024-03-17 ENCOUNTER — Ambulatory Visit: Payer: Self-pay | Admitting: Family Medicine

## 2024-03-17 ENCOUNTER — Ambulatory Visit (INDEPENDENT_AMBULATORY_CARE_PROVIDER_SITE_OTHER): Admitting: Family Medicine

## 2024-03-17 VITALS — BP 163/87 | HR 80 | Temp 97.8°F | Ht 69.0 in | Wt 149.0 lb

## 2024-03-17 DIAGNOSIS — Z125 Encounter for screening for malignant neoplasm of prostate: Secondary | ICD-10-CM | POA: Diagnosis not present

## 2024-03-17 DIAGNOSIS — L03032 Cellulitis of left toe: Secondary | ICD-10-CM | POA: Diagnosis not present

## 2024-03-17 DIAGNOSIS — E1169 Type 2 diabetes mellitus with other specified complication: Secondary | ICD-10-CM

## 2024-03-17 DIAGNOSIS — I152 Hypertension secondary to endocrine disorders: Secondary | ICD-10-CM

## 2024-03-17 DIAGNOSIS — Z794 Long term (current) use of insulin: Secondary | ICD-10-CM | POA: Diagnosis not present

## 2024-03-17 DIAGNOSIS — E1159 Type 2 diabetes mellitus with other circulatory complications: Secondary | ICD-10-CM | POA: Diagnosis not present

## 2024-03-17 DIAGNOSIS — E785 Hyperlipidemia, unspecified: Secondary | ICD-10-CM

## 2024-03-17 LAB — BAYER DCA HB A1C WAIVED: HB A1C (BAYER DCA - WAIVED): 5 % (ref 4.8–5.6)

## 2024-03-17 LAB — LIPID PANEL

## 2024-03-17 MED ORDER — MEGESTROL ACETATE 400 MG/10ML PO SUSP: 400.0000 mg | Freq: Two times a day (BID) | ORAL | 2 refills | Status: DC

## 2024-03-17 MED ORDER — CIPROFLOXACIN HCL 500 MG PO TABS: 500.0000 mg | ORAL_TABLET | Freq: Two times a day (BID) | ORAL | 0 refills | Status: DC

## 2024-03-17 MED ORDER — QUETIAPINE FUMARATE 25 MG PO TABS: 25.0000 mg | ORAL_TABLET | Freq: Every day | ORAL | 3 refills | Status: DC

## 2024-03-17 NOTE — Progress Notes (Signed)
Subjective:  Patient ID: [name], male    DOB: Mar 08, 1936  Age: 88 y.o. MRN: [id number]  CC: Medical Management of Chronic Issues   HPI  Discussed the use of AI scribe software for clinical note transcription with the patient, who gave verbal consent to proceed.  History of Present Illness [name] is an 88 year old male with diabetes who presents with a foot infection.  He has a foot infection causing pain and has been using a slide instead of a regular shoe to avoid pressure on the toes.  He has a history of diabetes managed with medication. A blood sample was taken, but he was unable to provide a urine sample during the visit.  He is experiencing memory issues and is on memantine  and rivastigmine , which seem to be helping. He also takes quetiapine  for anxiety and excitability, which has been beneficial.  He has experienced weight loss over the past year, losing approximately 13 pounds. His appetite has decreased, and he is not eating much.  His blood pressure was noted to be high at 175/85, and he is on losartan  for management.  No urinary symptoms such as frequent urination, although his caregiver notes increased urination. No issues with bowel movements or blood in stool. Breathing is okay, but he sleeps a lot.          03/17/2024   11:17 AM 02/06/2024    2:23 PM 12/26/2023   10:04 AM  Depression screen PHQ 2/9  Decreased Interest 1 1 0  Down, Depressed, Hopeless 0 0 0  PHQ - 2 Score 1 1 0  Altered sleeping 0  0  Tired, decreased energy 1  0  Change in appetite 1  0  Feeling bad or failure about yourself  0  0  Trouble concentrating 1  0  Moving slowly or fidgety/restless 0  0  Suicidal thoughts 0  0  PHQ-9 Score 4  0  Difficult doing work/chores   Not difficult at all    History [name] has a past medical history of Anxiety, Asthma, Choledocholithiasis (02/24/2013), COPD (chronic obstructive pulmonary disease) (HCC), CVA (cerebral vascular accident)  (HCC) (2013), Dementia (HCC), Diabetes mellitus, TIA (transient ischemic attack), Hyperlipidemia, and Hypertension.   He has a past surgical history that includes Rotator cuff repair (Right); Cholecystectomy (N/A, 02/23/2013); ERCP (N/A, 02/24/2013); and Cataract extraction w/PHACO (Left, 04/30/2016).   His family history includes Diabetes in his sister and sister; Emphysema in his brother; Heart disease in his brother; Hypertension in his father and mother.He reports that he has never smoked. He has never used smokeless tobacco. He reports that he does not drink alcohol and does not use drugs.    ROS Review of Systems  Constitutional: Negative.   HENT: Negative.    Eyes:  Negative for visual disturbance.  Respiratory:  Negative for cough and shortness of breath.   Cardiovascular:  Negative for chest pain and leg swelling.  Gastrointestinal:  Negative for abdominal pain, diarrhea, nausea and vomiting.  Genitourinary:  Negative for difficulty urinating.  Musculoskeletal:  Negative for arthralgias and myalgias.  Skin:  Negative for rash.  Neurological:  Negative for headaches.  Psychiatric/Behavioral:  Positive for confusion (forgetful). Negative for sleep disturbance.     Objective:  BP (!) 163/87   Pulse 80   Temp 97.8 F (36.6 C)   Ht 5' 9 (1.753 m)   Wt 149 lb (67.6 kg)   SpO2 99%   BMI 22.00 kg/m  BP Readings from Last 3 Encounters:  03/17/24 (!) 163/87  02/06/24 132/84  12/26/23 100/61    Wt Readings from Last 3 Encounters:  03/17/24 149 lb (67.6 kg)  02/06/24 148 lb (67.1 kg)  12/26/23 149 lb 9.6 oz (67.9 kg)     Physical Exam Vitals reviewed.  Constitutional:      Appearance: He is well-developed.  HENT:     Head: Normocephalic and atraumatic.     Right Ear: External ear normal.     Left Ear: External ear normal.     Mouth/Throat:     Pharynx: No oropharyngeal exudate or posterior oropharyngeal erythema.  Eyes:     Pupils: Pupils are equal, round, and  reactive to light.  Cardiovascular:     Rate and Rhythm: Normal rate and regular rhythm.     Heart sounds: No murmur heard. Pulmonary:     Effort: No respiratory distress.     Breath sounds: Normal breath sounds.  Musculoskeletal:     Cervical back: Normal range of motion and neck supple.  Neurological:     Mental Status: He is alert and oriented to person, place, and time.      Assessment & Plan:  Screening PSA (prostate specific antigen) -     PSA, total and free  Type 2 diabetes mellitus with other specified complication, with long-term current use of insulin  (HCC) -     CMP14+EGFR -     Bayer DCA Hb A1c Waived -     Microalbumin / creatinine urine ratio -     Bayer DCA Hb A1c Waived  Hypertension associated with type 2 diabetes mellitus (HCC) -     CBC with Differential/Platelet  Hyperlipidemia associated with type 2 diabetes mellitus (HCC) -     Lipid panel  Other orders -     Ciprofloxacin  HCl; Take 1 tablet (500 mg total) by mouth 2 (two) times daily.  Dispense: 20 tablet; Refill: 0 -     Megestrol  Acetate; Take 10 mLs (400 mg total) by mouth 2 (two) times daily. For appetite stimulation  Dispense: 600 mL; Refill: 2 -     QUEtiapine  Fumarate; Take 1 tablet (25 mg total) by mouth at bedtime. For sleep and for nerves  Dispense: 90 tablet; Refill: 3    Assessment and Plan Assessment & Plan Type 2 diabetes mellitus with foot infection and other specified complications   He has a foot infection with swelling, likely due to infection. Effective diabetes management is crucial for healing. Current weight loss may improve diabetes control. Awaiting A1c results to assess diabetes management. Continue current diabetes medications. Monitor the foot infection; if not healed by the end of the antibiotic course, refer to a podiatrist for debridement. Await A1c results to evaluate diabetes control.  Hypertension   His blood pressure is 175/85 mmHg, indicating suboptimal control.  Losartan  is prescribed for blood pressure management and renal protection due to diabetes. Ensure daily administration of Losartan  and monitor blood pressure regularly.  Dementia with behavioral disturbance   He has dementia with behavioral disturbances, including anxiety and excitability. Current medications include memantine , rivastigmine , and quetiapine . Quetiapine  is effective in managing behavioral symptoms. Continue memantine  and rivastigmine  for memory support. Continue quetiapine  for behavioral disturbances and send in refills as the current prescription expires in November.  Unintentional weight loss   He has experienced a weight loss of 13 pounds over the past year. Decreased appetite may contribute to this weight loss. Consider prescribing an appetite stimulant if  weight loss continues or appetite does not improve. Monitor nutritional intake and encourage a balanced diet.       Follow-up: Return in about 3 months (around 06/19/2024).  Butler Der, M.D.

## 2024-03-18 LAB — CMP14+EGFR
ALT: 18 IU/L (ref 0–44)
AST: 21 IU/L (ref 0–40)
Albumin: 4.3 g/dL (ref 3.7–4.7)
Alkaline Phosphatase: 117 IU/L (ref 44–121)
BUN/Creatinine Ratio: 9 — AB (ref 10–24)
BUN: 11 mg/dL (ref 8–27)
Bilirubin Total: 0.5 mg/dL (ref 0.0–1.2)
CO2: 21 mmol/L (ref 20–29)
Calcium: 9.1 mg/dL (ref 8.6–10.2)
Chloride: 104 mmol/L (ref 96–106)
Creatinine, Ser: 1.21 mg/dL (ref 0.76–1.27)
Globulin, Total: 3.1 g/dL (ref 1.5–4.5)
Glucose: 175 mg/dL — AB (ref 70–99)
Potassium: 4.4 mmol/L (ref 3.5–5.2)
Sodium: 139 mmol/L (ref 134–144)
Total Protein: 7.4 g/dL (ref 6.0–8.5)
eGFR: 59 mL/min/1.73 — AB (ref >59)

## 2024-03-18 LAB — CBC WITH DIFFERENTIAL/PLATELET
Basophils Absolute: 0 x10E3/uL (ref 0.0–0.2)
Basos: 1 %
EOS (ABSOLUTE): 0.2 x10E3/uL (ref 0.0–0.4)
Eos: 3 %
Hematocrit: 38 % (ref 37.5–51.0)
Hemoglobin: 12.8 g/dL — ABNORMAL LOW (ref 13.0–17.7)
Immature Grans (Abs): 0 x10E3/uL (ref 0.0–0.1)
Immature Granulocytes: 0 %
Lymphocytes Absolute: 1.1 x10E3/uL (ref 0.7–3.1)
Lymphs: 17 %
MCH: 31.4 pg (ref 26.6–33.0)
MCHC: 33.7 g/dL (ref 31.5–35.7)
MCV: 93 fL (ref 79–97)
Monocytes Absolute: 0.4 x10E3/uL (ref 0.1–0.9)
Monocytes: 6 %
Neutrophils Absolute: 4.8 x10E3/uL (ref 1.4–7.0)
Neutrophils: 73 %
Platelets: 217 x10E3/uL (ref 150–450)
RBC: 4.08 x10E6/uL — ABNORMAL LOW (ref 4.14–5.80)
RDW: 13.3 % (ref 11.6–15.4)
WBC: 6.5 x10E3/uL (ref 3.4–10.8)

## 2024-03-18 LAB — PSA, TOTAL AND FREE
PSA, Free Pct: 32.5
PSA, Free: 0.13 ng/mL
Prostate Specific Ag, Serum: 0.4 ng/mL (ref 0.0–4.0)

## 2024-03-18 LAB — LIPID PANEL
Cholesterol, Total: 98 mg/dL — AB (ref 100–199)
HDL: 50 mg/dL (ref >39)
LDL CALC COMMENT:: 2 ratio (ref 0.0–5.0)
LDL Chol Calc (NIH): 27 mg/dL (ref 0–99)
Triglycerides: 117 mg/dL (ref 0–149)
VLDL Cholesterol Cal: 21 mg/dL (ref 5–40)

## 2024-03-22 NOTE — Progress Notes (Signed)
Hello Averi,  Your lab result is normal and/or stable.Some minor variations that are not significant are commonly marked abnormal, but do not represent any medical problem for you.  Best regards, Lanice Folden, M.D.

## 2024-03-29 ENCOUNTER — Emergency Department (HOSPITAL_COMMUNITY)

## 2024-03-29 ENCOUNTER — Encounter (HOSPITAL_COMMUNITY): Payer: Self-pay | Admitting: Emergency Medicine

## 2024-03-29 ENCOUNTER — Inpatient Hospital Stay (HOSPITAL_COMMUNITY)
Admission: EM | Admit: 2024-03-29 | Discharge: 2024-04-03 | DRG: 482 | Disposition: A | Discharge status: Skilled Nursing Facility | Attending: Internal Medicine | Admitting: Internal Medicine

## 2024-03-29 ENCOUNTER — Other Ambulatory Visit: Payer: Self-pay

## 2024-03-29 ENCOUNTER — Other Ambulatory Visit: Payer: Self-pay | Admitting: Family Medicine

## 2024-03-29 DIAGNOSIS — Z79899 Other long term (current) drug therapy: Secondary | ICD-10-CM

## 2024-03-29 DIAGNOSIS — S72122D Displaced fracture of lesser trochanter of left femur, subsequent encounter for closed fracture with routine healing: Secondary | ICD-10-CM | POA: Diagnosis not present

## 2024-03-29 DIAGNOSIS — M25552 Pain in left hip: Secondary | ICD-10-CM | POA: Diagnosis not present

## 2024-03-29 DIAGNOSIS — Z825 Family history of asthma and other chronic lower respiratory diseases: Secondary | ICD-10-CM

## 2024-03-29 DIAGNOSIS — N1831 Chronic kidney disease, stage 3a: Secondary | ICD-10-CM | POA: Diagnosis not present

## 2024-03-29 DIAGNOSIS — S72002A Fracture of unspecified part of neck of left femur, initial encounter for closed fracture: Principal | ICD-10-CM

## 2024-03-29 DIAGNOSIS — F0394 Unspecified dementia, unspecified severity, with anxiety: Secondary | ICD-10-CM | POA: Diagnosis not present

## 2024-03-29 DIAGNOSIS — F0390 Unspecified dementia without behavioral disturbance: Secondary | ICD-10-CM | POA: Diagnosis present

## 2024-03-29 DIAGNOSIS — S7292XA Unspecified fracture of left femur, initial encounter for closed fracture: Secondary | ICD-10-CM | POA: Diagnosis present

## 2024-03-29 DIAGNOSIS — S72142A Displaced intertrochanteric fracture of left femur, initial encounter for closed fracture: Principal | ICD-10-CM | POA: Diagnosis present

## 2024-03-29 DIAGNOSIS — Z7982 Long term (current) use of aspirin: Secondary | ICD-10-CM | POA: Diagnosis not present

## 2024-03-29 DIAGNOSIS — E1159 Type 2 diabetes mellitus with other circulatory complications: Secondary | ICD-10-CM | POA: Diagnosis not present

## 2024-03-29 DIAGNOSIS — I10 Essential (primary) hypertension: Secondary | ICD-10-CM | POA: Diagnosis not present

## 2024-03-29 DIAGNOSIS — I152 Hypertension secondary to endocrine disorders: Secondary | ICD-10-CM | POA: Diagnosis not present

## 2024-03-29 DIAGNOSIS — M1712 Unilateral primary osteoarthritis, left knee: Secondary | ICD-10-CM | POA: Diagnosis not present

## 2024-03-29 DIAGNOSIS — Z8249 Family history of ischemic heart disease and other diseases of the circulatory system: Secondary | ICD-10-CM | POA: Diagnosis not present

## 2024-03-29 DIAGNOSIS — J45998 Other asthma: Secondary | ICD-10-CM | POA: Diagnosis not present

## 2024-03-29 DIAGNOSIS — R000 Tachycardia, unspecified: Secondary | ICD-10-CM | POA: Diagnosis not present

## 2024-03-29 DIAGNOSIS — J449 Chronic obstructive pulmonary disease, unspecified: Secondary | ICD-10-CM | POA: Diagnosis not present

## 2024-03-29 DIAGNOSIS — F419 Anxiety disorder, unspecified: Secondary | ICD-10-CM | POA: Diagnosis not present

## 2024-03-29 DIAGNOSIS — I639 Cerebral infarction, unspecified: Secondary | ICD-10-CM | POA: Diagnosis not present

## 2024-03-29 DIAGNOSIS — M160 Bilateral primary osteoarthritis of hip: Secondary | ICD-10-CM | POA: Diagnosis not present

## 2024-03-29 DIAGNOSIS — R1312 Dysphagia, oropharyngeal phase: Secondary | ICD-10-CM | POA: Diagnosis not present

## 2024-03-29 DIAGNOSIS — W19XXXA Unspecified fall, initial encounter: Secondary | ICD-10-CM | POA: Diagnosis not present

## 2024-03-29 DIAGNOSIS — R102 Pelvic and perineal pain: Secondary | ICD-10-CM | POA: Diagnosis not present

## 2024-03-29 DIAGNOSIS — Z833 Family history of diabetes mellitus: Secondary | ICD-10-CM

## 2024-03-29 DIAGNOSIS — M7989 Other specified soft tissue disorders: Secondary | ICD-10-CM | POA: Diagnosis not present

## 2024-03-29 DIAGNOSIS — E1169 Type 2 diabetes mellitus with other specified complication: Secondary | ICD-10-CM

## 2024-03-29 DIAGNOSIS — J4489 Other specified chronic obstructive pulmonary disease: Secondary | ICD-10-CM | POA: Diagnosis not present

## 2024-03-29 DIAGNOSIS — Z01818 Encounter for other preprocedural examination: Secondary | ICD-10-CM | POA: Diagnosis not present

## 2024-03-29 DIAGNOSIS — Y92009 Unspecified place in unspecified non-institutional (private) residence as the place of occurrence of the external cause: Secondary | ICD-10-CM

## 2024-03-29 DIAGNOSIS — S91102D Unspecified open wound of left great toe without damage to nail, subsequent encounter: Secondary | ICD-10-CM | POA: Diagnosis not present

## 2024-03-29 DIAGNOSIS — E1122 Type 2 diabetes mellitus with diabetic chronic kidney disease: Secondary | ICD-10-CM | POA: Diagnosis not present

## 2024-03-29 DIAGNOSIS — Z743 Need for continuous supervision: Secondary | ICD-10-CM | POA: Diagnosis not present

## 2024-03-29 DIAGNOSIS — S91102A Unspecified open wound of left great toe without damage to nail, initial encounter: Secondary | ICD-10-CM | POA: Diagnosis not present

## 2024-03-29 DIAGNOSIS — Z8673 Personal history of transient ischemic attack (TIA), and cerebral infarction without residual deficits: Secondary | ICD-10-CM

## 2024-03-29 DIAGNOSIS — Z9889 Other specified postprocedural states: Secondary | ICD-10-CM | POA: Diagnosis not present

## 2024-03-29 DIAGNOSIS — I129 Hypertensive chronic kidney disease with stage 1 through stage 4 chronic kidney disease, or unspecified chronic kidney disease: Secondary | ICD-10-CM | POA: Diagnosis present

## 2024-03-29 DIAGNOSIS — G8911 Acute pain due to trauma: Secondary | ICD-10-CM | POA: Diagnosis not present

## 2024-03-29 DIAGNOSIS — J4521 Mild intermittent asthma with (acute) exacerbation: Secondary | ICD-10-CM

## 2024-03-29 DIAGNOSIS — S72142D Displaced intertrochanteric fracture of left femur, subsequent encounter for closed fracture with routine healing: Secondary | ICD-10-CM | POA: Diagnosis not present

## 2024-03-29 DIAGNOSIS — D649 Anemia, unspecified: Secondary | ICD-10-CM | POA: Diagnosis not present

## 2024-03-29 DIAGNOSIS — S72102A Unspecified trochanteric fracture of left femur, initial encounter for closed fracture: Secondary | ICD-10-CM | POA: Diagnosis not present

## 2024-03-29 DIAGNOSIS — I69391 Dysphagia following cerebral infarction: Secondary | ICD-10-CM | POA: Diagnosis not present

## 2024-03-29 DIAGNOSIS — E782 Mixed hyperlipidemia: Secondary | ICD-10-CM | POA: Diagnosis present

## 2024-03-29 LAB — CBC WITH DIFFERENTIAL/PLATELET
Abs Immature Granulocytes: 0.04 K/uL (ref 0.00–0.07)
Basophils Absolute: 0 K/uL (ref 0.0–0.1)
Basophils Relative: 0 %
Eosinophils Absolute: 0.2 K/uL (ref 0.0–0.5)
Eosinophils Relative: 2 %
HCT: 35.3 % — ABNORMAL LOW (ref 39.0–52.0)
Hemoglobin: 11.9 g/dL — ABNORMAL LOW (ref 13.0–17.0)
Immature Granulocytes: 0 %
Lymphocytes Relative: 11 %
Lymphs Abs: 1 K/uL (ref 0.7–4.0)
MCH: 31.6 pg (ref 26.0–34.0)
MCHC: 33.7 g/dL (ref 30.0–36.0)
MCV: 93.6 fL (ref 80.0–100.0)
Monocytes Absolute: 0.6 K/uL (ref 0.1–1.0)
Monocytes Relative: 7 %
Neutro Abs: 7.2 K/uL (ref 1.7–7.7)
Neutrophils Relative %: 80 %
Platelets: 181 K/uL (ref 150–400)
RBC: 3.77 MIL/uL — ABNORMAL LOW (ref 4.22–5.81)
RDW: 12.8 % (ref 11.5–15.5)
WBC: 9.1 K/uL (ref 4.0–10.5)
nRBC: 0 % (ref 0.0–0.2)

## 2024-03-29 LAB — BASIC METABOLIC PANEL WITH GFR
Anion gap: 14 (ref 5–15)
BUN: 23 mg/dL (ref 8–23)
CO2: 17 mmol/L — ABNORMAL LOW (ref 22–32)
Calcium: 8.4 mg/dL — ABNORMAL LOW (ref 8.9–10.3)
Chloride: 108 mmol/L (ref 98–111)
Creatinine, Ser: 1.52 mg/dL — ABNORMAL HIGH (ref 0.61–1.24)
GFR, Estimated: 44 mL/min — ABNORMAL LOW (ref >60)
Glucose, Bld: 129 mg/dL — ABNORMAL HIGH (ref 70–99)
Potassium: 4.2 mmol/L (ref 3.5–5.1)
Sodium: 139 mmol/L (ref 135–145)

## 2024-03-29 LAB — PROTIME-INR
INR: 1 (ref 0.8–1.2)
Prothrombin Time: 13.9 s (ref 11.4–15.2)

## 2024-03-29 LAB — TYPE AND SCREEN
ABO/RH(D): B POS
Antibody Screen: NEGATIVE

## 2024-03-29 MED ORDER — FENTANYL CITRATE (PF) 100 MCG/2ML IJ SOLN
50.0000 ug | INTRAMUSCULAR | Status: DC | PRN
  Administered 2024-03-29 (×2): 50 ug via INTRAVENOUS
  Filled 2024-03-29 (×2): qty 2

## 2024-03-29 MED ORDER — CEFAZOLIN SODIUM-DEXTROSE 2-4 GM/100ML-% IV SOLN
2.0000 g | INTRAVENOUS | Status: AC
  Administered 2024-03-30: 2 g via INTRAVENOUS
  Filled 2024-03-29 (×2): qty 100

## 2024-03-29 MED ORDER — TRANEXAMIC ACID-NACL 1000-0.7 MG/100ML-% IV SOLN
1000.0000 mg | INTRAVENOUS | Status: AC
  Administered 2024-03-30: 1000 mg via INTRAVENOUS
  Filled 2024-03-29: qty 100

## 2024-03-29 NOTE — ED Provider Notes (Signed)
 [name] Provider Note   CSN: [id number] Arrival date & time: 03/29/24  2010     Patient presents with: Fall and Hip Pain   [name] is a 88 y.o. male.    Fall  Hip Pain   This patient is a 88 year old male, he has dementia, he had a witnessed fall where he landed on his left side, this was a mechanical fall, he was not able to get up, according to the paramedics there was no obvious deformity however on my exam the patient clearly has a shortened and externally rotated left lower extremity.  The patient does not have any idea what happened, he does not know why he is here, he has no complaints.  The patient is not anticoagulated other than a baby aspirin .    Prior to Admission medications   Medication Sig Start Date End Date Taking? Authorizing Provider  albuterol  (VENTOLIN  HFA) 108 (90 Base) MCG/ACT inhaler Inhale 2 puffs into the lungs every 4 (four) hours as needed for wheezing or shortness of breath. 04/12/20   Zollie Lowers, MD  aspirin  EC 81 MG tablet Take 162 mg by mouth daily.     [provider]  atorvastatin  (LIPITOR) 40 MG tablet TAKE 1 TABLET EVERY DAY 01/16/24   Zollie Lowers, MD  Blood Glucose Calibration (ACCU-CHEK AVIVA) SOLN Use with glucometer Dx E11.9 10/28/20   Zollie Lowers, MD  ciprofloxacin  (CIPRO ) 500 MG tablet Take 1 tablet (500 mg total) by mouth 2 (two) times daily. 03/17/24   Zollie Lowers, MD  diclofenac  Sodium (VOLTAREN ) 1 % GEL Apply 2 g topically 4 (four) times daily. 05/22/19   Severa Rock HERO, [name]  fluticasone  furoate-vilanterol (BREO ELLIPTA ) 100-25 MCG/INH AEPB Inhale 1 puff into the lungs daily. 04/12/20   Zollie Lowers, MD  glucose blood (ACCU-CHEK GUIDE) test strip TEST BLOOD SUGAR TWICE DAILY Dx E11.9 12/13/21   Zollie Lowers, MD  ipratropium-albuterol  (DUONEB) 0.5-2.5 (3) MG/3ML SOLN Take 3 mLs by nebulization every 4 (four) hours as needed. 04/29/20   Kehrli, Kelsey F, PA-C   losartan  (COZAAR ) 50 MG tablet TAKE 1 TABLET EVERY DAY 01/16/24   Zollie Lowers, MD  megestrol  (MEGACE ) 400 MG/10ML suspension Take 10 mLs (400 mg total) by mouth 2 (two) times daily. For appetite stimulation 03/17/24   Zollie Lowers, MD  memantine  (NAMENDA ) 5 MG tablet Take 1 tablet (5 mg total) by mouth 2 (two) times daily. 12/26/23   Zollie Lowers, MD  QUEtiapine  (SEROQUEL ) 25 MG tablet Take 1 tablet (25 mg total) by mouth at bedtime. For sleep and for nerves 03/17/24   Zollie Lowers, MD  rivastigmine  (EXELON ) 6 MG capsule Take 1 capsule (6 mg total) by mouth 2 (two) times daily. For memory 12/26/23   Zollie Lowers, MD  tadalafil  (CIALIS ) 20 MG tablet Take 0.5-1 tablets (10-20 mg total) by mouth every other day as needed for erectile dysfunction. 12/26/23   Zollie Lowers, MD  triamcinolone  cream (KENALOG ) 0.1 % Apply 1 Application topically 3 (three) times daily. Avoid face and genitalia 09/25/23   Zollie Lowers, MD    Allergies: Ace inhibitors    Review of Systems  All other systems reviewed and are negative.   Updated Vital Signs BP (!) 162/82   Pulse 72   Temp 98.2 F (36.8 C)   Resp 14   SpO2 99%   Physical Exam Vitals and nursing note reviewed.  Constitutional:      General: He is not  in acute distress.    Appearance: He is well-developed.  HENT:     Head: Normocephalic and atraumatic.     Mouth/Throat:     Pharynx: No oropharyngeal exudate.  Eyes:     General: No scleral icterus.       Right eye: No discharge.        Left eye: No discharge.     Conjunctiva/sclera: Conjunctivae normal.     Pupils: Pupils are equal, round, and reactive to light.  Neck:     Thyroid : No thyromegaly.     Vascular: No JVD.  Cardiovascular:     Rate and Rhythm: Normal rate and regular rhythm.     Heart sounds: Normal heart sounds. No murmur heard.    No friction rub. No gallop.  Pulmonary:     Effort: Pulmonary effort is normal. No respiratory distress.     Breath sounds: Normal  breath sounds. No wheezing or rales.  Abdominal:     General: Bowel sounds are normal. There is no distension.     Palpations: Abdomen is soft. There is no mass.     Tenderness: There is no abdominal tenderness.  Musculoskeletal:        General: Tenderness, deformity and signs of injury present. Normal range of motion.     Cervical back: Normal range of motion and neck supple.     Right lower leg: No edema.     Left lower leg: No edema.     Comments: The left lower extremity is shortened and externally rotated, there is tenderness with any range of motion of the left hip, the other 3 extremities are normal  Lymphadenopathy:     Cervical: No cervical adenopathy.  Skin:    General: Skin is warm and dry.     Findings: No erythema or rash.  Neurological:     Mental Status: He is alert.     Coordination: Coordination normal.     Comments: The patient is pleasant, can tell me his name, cannot tell me his date of birth his location or the circumstances  Psychiatric:        Behavior: Behavior normal.     (all labs ordered are listed, but only abnormal results are displayed) Labs Reviewed  BASIC METABOLIC PANEL WITH GFR - Abnormal; Notable for the following components:      Result Value   CO2 17 (*)    Glucose, Bld 129 (*)    Creatinine, Ser 1.52 (*)    Calcium  8.4 (*)    GFR, Estimated 44 (*)    All other components within normal limits  CBC WITH DIFFERENTIAL/PLATELET - Abnormal; Notable for the following components:   RBC 3.77 (*)    Hemoglobin 11.9 (*)    HCT 35.3 (*)    All other components within normal limits  PROTIME-INR  TYPE AND SCREEN    EKG: EKG Interpretation Date/Time:  Sunday March 29 2024 20:40:16 EDT Ventricular Rate:  80 PR Interval:  197 QRS Duration:  111 QT Interval:  413 QTC Calculation: 477 R Axis:   54  Text Interpretation: Sinus tachycardia Paired ventricular premature complexes Low voltage, extremity and precordial leads Borderline prolonged  QT interval Confirmed by Cleotilde Rogue ([id number]) on 03/29/2024 8:47:54 PM  Radiology: [facility]COLA Knee 1-2 Views Left Result Date: 03/29/2024 EXAM: 2 VIEW(S) XRAY OF THE LEFT KNEE 03/29/2024 10:10:26 PM COMPARISON: None available. CLINICAL HISTORY: 747648 Post-operative state 252351. Table formatting from the original note was not included. Pt arrives w/ [facility] per EMS: pt comes from home w/ c/o mechanical witnessed fall tonight \\T \ landed left hip. C/o 10/10 left hip pain. No obvious deformity per EMS. Denies thinners, denies hitting head. preop Pt arrives w/ [facility] per EMS: pt comes from home w/ c/o mechanical witnessed fall tonight \\T \ landed left hip. C/o 10/10 left hip pain. No obvious deformity per EMS. Denies thinners, denies hitting head. FINDINGS: BONES AND JOINTS: No acute fracture. No focal osseous lesion. No joint dislocation. No significant joint effusion. Mild tricompartmental degenerative changes, most prominent in the medial compartment. SOFT TISSUES: The soft tissues are unremarkable. IMPRESSION: 1. No acute fracture or dislocation. Electronically signed by: Pinkie Pebbles MD 03/29/2024 10:15 PM EDT RP Workstation: [id number]   DG Hip Unilat With Pelvis 2-3 Views Left Result Date: 03/29/2024 CLINICAL DATA:  Preop EXAM: DG HIP (WITH OR WITHOUT PELVIS) 2-3V LEFT COMPARISON:  None Available. FINDINGS: Left femoral intertrochanteric fracture with mild displacement and varus angulation. No subluxation or dislocation. Mild symmetric degenerative changes in the hips. IMPRESSION: Displaced, mildly angulated left femoral intertrochanteric fracture. Electronically Signed   By: Franky Crease M.D.   On: 03/29/2024 21:21   DG Chest 1 View Result Date: 03/29/2024 CLINICAL DATA:  Preoperative EXAM: CHEST  1 VIEW COMPARISON:  Chest x-ray 04/29/2020 FINDINGS: The heart size and mediastinal contours are within normal limits. Both lungs are clear. The visualized skeletal structures are unremarkable.  IMPRESSION: No active disease. Electronically Signed   By: Greig Pique M.D.   On: 03/29/2024 21:20     Procedures   Medications Ordered in the ED  tranexamic acid  (CYKLOKAPRON ) IVPB 1,000 mg (has no administration in time range)  ceFAZolin  (ANCEF ) IVPB 2g/100 mL premix (has no administration in time range)  fentaNYL  (SUBLIMAZE ) injection 50 mcg (50 mcg Intravenous Given 03/29/24 2159)                                    Medical Decision Making Amount and/or Complexity of Data Reviewed Labs: ordered. Radiology: ordered.  Risk Prescription drug management. Decision regarding hospitalization.   No distress, exam consistent with having likely hip fracture, imaging pending, n.p.o. status, chest x-ray EKG preop, labs, patient agreeable, does not appear to be agitated whatsoever at this time  Labs:  I  personally viewed and interpreted the labs which show CBC with mild anemia but no leukocytosis, metabolic panel shows creatinine of 1.5 and a glucose of 129   Radiology Imaging: I personally viewed the images of the ordered radiographic studies and find fracture of the left hip intertrochanteric, chest x-ray unremarkable I agree with the radiologist interpretation as well   Meds / Interventions: while in the ED the patient received the following: Fentanyl  The response to the interventions was that the patient improved   Consultations: I discussed the case with Dr. Georgina of the orthopedic service, they recommend transfer to [facility], he will operate, hospitalist to admit. Hospitalist will admit and coordinate transfer to [facility]      Final diagnoses:  Closed fracture of left hip, initial encounter [facility])     Cleotilde Rogue, MD 03/29/24 2222

## 2024-03-29 NOTE — ED Triage Notes (Addendum)
 Pt arrives w/ [facility] per EMS: pt comes from home w/ c/o mechanical witnessed fall tonight & landed left hip. C/o 10/10 left hip pain. No obvious deformity per EMS. Denies thinners, denies hitting head.  Hx dementia.  154/76 100O2 67HR  118 CBG  Shortening and rotation noted upon triage

## 2024-03-29 NOTE — Consult Note (Signed)
 Orthopedic Surgery Consult Note  Assessment: Patient is a 88 y.o. male with left intertrochanteric femur fracture   Plan: -Recommend operative fixation for this fracture, planning for operative fixation tonight -Diet: NPO for procedure -DVT ppx: aspirin  81mg  BID post-operatively -Ancef  and TXA on call to OR -Weight bearing status: NWB LLE -PT evaluate and treat -Pain control -Dispo: pending completion of operative plans   Will need to discuss surgery with family. Patient not consentable this morning.   ___________________________________________________________________________   Reason for consult: Left intertrochanteric femur fracture  History:  Patient is a 88 y.o. male who had a fall at his house earlier today.  Was unable to ambulate.  Was brought to [facility] emergency department.  He was reporting left hip pain.  Workup found a left intertrochanteric femur fracture.  He was admitted to medicine and transferred to [facility].  He says that he is not having any pain anywhere. Not responding to other questions appropriately.   Past medical history:  History of TIA HLD HTN COPD History of CVA Anxiety  Allergies: ACEi   Past surgical history:  Cataract surgery Cholecystectomy Rotator cuff repair  Social history: Denies use of nicotine-containing products (cigarettes, vaping, smokeless, etc.) Alcohol use: denies Denies use of recreational drugs  Family history: -reviewed and not pertinent to intertrochanteric femur fracture   Physical Exam:  General: no acute distress, appears stated age, hands in mittens with wrist restraints Neurologic: alert, not following commands, not answering questions with appropriate responses Cardiovascular: regular rate, no cyanosis Respiratory: unlabored breathing on room air, symmetric chest rise  MSK:   -Bilateral upper extremities  No tenderness to palpation over extremity, no gross deformity, no open wounds Seen firing  deltoid, biceps, triceps, wrist extensors, wrist flexors, finger extensors, finger flexors  Not participatory with specific exam of AIN/PIN/IO  Palpable radial pulse  Sensation intact to light touch in median/ulnar/radial/axillary nerve distributions  Hand warm and well perfused  -Right lower extremity  No tenderness to palpation over extremity, no gross deformity, no open wounds, no pain with logroll Seen firing quadriceps, hamstrings, tibialis anterior, gastrocnemius and soleus, extensor hallucis longus. Did not see him firing hip flexors Plantarflexes and dorsiflexes toes Responds to light touch in sural, saphenous, tibial, deep peroneal, and superficial peroneal nerve distributions Foot warm and well perfused  -Left lower extremity  TTP over the hip, no other tenderness palpation over the remainder of the extremity, leg shorter when compared to contralateral side, no open wounds, pain with logroll at the hip Did not see firing hip flexors, quadriceps, or hamstrings due to pain.  Did see him fire EHL/TA/GSC Plantarflexes and dorsiflexes toes Responds to light touch to light touch in sural, saphenous, tibial, deep peroneal, and superficial peroneal nerve distributions Foot warm and well perfused  Imaging: XRs of the left hip from 03/29/2024 was independently reviewed and interpreted, showing a oblique intertrochanteric femur fracture with shortening and displacement.  The lesser trochanter is displaced and medially translated.  No other fracture seen.  No dislocation seen.   [name] Patient MRN: [id number] Date: 03/30/2024

## 2024-03-29 NOTE — Op Note (Signed)
 Orthopedic Surgery Operative Report   Procedure: Left intertrochanteric fracture open reduction internal fixation with intramedullary rodding   Modifier: none   Date of procedure: 03/30/2024   [name] MRN: [id number] DOB: July 09, 1936  Surgeon: Ozell Ada, MD Assistant: none Pre-operative diagnosis: left intertrochanteric hip fracture Post-operative diagnosis: same as above Findings: left intertrochanteric femur fracture   Specimens: none Anesthesia: general EBL: 100cc Complications: none Pre-incision antibiotic: ancef  TXA was given prior to incision as well    Implants:  Implant Name Type Inv. Item Serial No. Manufacturer Lot No. LRB No. Used Action  NAIL LESTER GAILS 10X18CM - [id number] Nail NAIL TRIGEN INTERTAN 10X18CM  [employer]  Left 1 Implanted  SCREW LAG COMPR KIT 105/100 - [id number] Screw SCREW LAG COMPR KIT 105/100  [employer]  Left 1 Implanted  SCREW TRIGEN LOW PROF 5.0X37.5 - [id number] Screw SCREW TRIGEN LOW PROF 5.0X37.5  [employer]  Left 1 Implanted      Indication for procedure: Patient is a 88 y.o. male who presented to the ER after a fall at home. The patient had left hip pain and x-rays revealed a intertrochanteric femur fracture. The patient was admitted to a medicine service with orthopedics consulted. I met the patient and discussed the fracture. I recommended operative management in the form of intramedullary rodding to stabilize the fracture and allow for mobilization. I covered the risks, benefits, and alternatives of surgery with the patient and his family. After this conversation, they elected to proceed.     Procedure Description: The patient was met in the pre-operative holding area. The patient's identity and consent were verified. The operative site was marked by myself. The patient and his family's remaining questions about the surgery were answered. The patient was brought  back to the operating room. General anesthesia was induced and an endotracheal tube was placed by the anesthesia staff. The patient was transferred to the [facility] table. All bony prominences were well padded. Traction was applied and reduction was attempted with manipulation. Fluoroscopy confirmed a satisfactory reduction. The surgical area was cleansed with alcohol. Ancef  and TXA were administered by anesthesia. The patient's skin was then prepped and draped in a standard, sterile fashion. A time out was performed that identified the patient, the procedure, and the operative site. All team members agreed with what was stated in the time out.    An incision was made just proximal and inferior to the greater trochanter. The incision was taken sharply down through the fascia. A guide pin was inserted into the wound onto the top of the greater trochanter. Fluoroscopy was used to place the guide pin at the starting point at the tip of the greater trochanter and in line with the middle of the femoral neck. The wire was then advanced to a point just past the lesser trochanter. A soft tissue sleeve was advanced over the wire onto the greater trochanter. An entry reamer was used to open the proximal femoral canal under fluoroscopic guidance. The pin and reamer were removed. A long guide wire was placed down the femoral canal. A 10x18cm nail was advanced over the guidewire under fluoroscopic guidance. The guidewire was removed.    An incision was made sharply through the skin, dermis, and fascia over the lateral thigh in the area where the lag screws would be inserted. The lag screw targeter was placed through the jig onto the lateral femoral cortex. A guide wire was advanced through the lag  screw targeter into the femoral head under fluoroscopic guidance. It was found to be in acceptable position on the AP and lateral views. The length of the lag screw was estimated off of the guide wire. A screw was selected. The  inferior lag screw was drilled through the guide. The derotation device was placed through the targeter. The proximal lag screw hole was then drilled over the guide wire. The screw was inserted over the wire under fluoroscopic guidance. The derotation bar was removed and the inferior lag screw was inserted. AP and lateral fluoroscopic images confirmed satisfactory position of the screws in the femoral head.   An incision was made over the distal interlocking screw of the nail. Incision was taken sharply down through the skin, dermis, and fascia. A targeter was placed through the jig onto the lateral femoral cortex. A drill was used to drill the femur bicortically through the nail. The length of the screw was estimated off the drill. A 37.38mm screw was selected and inserted through the femur and distal hole of the nail. The jig was removed from the nail. Final AP and lateral fluoroscopic images confirmed satisfactory reduction and position of the fixation.    The wounds were copiously irrigated with sterile saline. Vancomycin  powder was placed into the wounds. The fascia was closed with 0 vicryl. The deep dermal layer was closed with 2-0 vicryl. The skin was closed with staples. Dressings were applied. All counts were correct at the end of the case. Patient was transferred back to a hospital bed. The patient was awakened from anesthesia and brought back to the post-anesthesia care unit in stable condition.     Post-operative plan: The patient will recover in the post-anesthesia care unit and then go to the floor on the medicine service. The patient will receive two post-operative doses of ancef . He will get another dose of TXA. The patient will be weight bearing as tolerated. The patient will work with physical therapy. The patient's disposition will be determined by the medicine service.        Ozell Ada, MD Orthopedic Surgeon

## 2024-03-29 NOTE — Discharge Instructions (Addendum)
 Orthopedic Surgery Discharge Instructions  [name] Fracture: left intertrochanteric femur fracture Procedure Performed: left hip cephalomedullary nail Date of Surgery: 03/30/2024 Surgeon: Ozell Ada, MD  Activity: You are allowed to put as much weight on your leg as you would like. You can walk as much as you would like. You can perform household activities such as cleaning dishes, doing laundry, vacuuming, etc.  Incision Care: Your incision site has a dressing over it. That dressing should remain in place and dry at all times for a total of one week after surgery. After one week, you can remove the dressing. Underneath the dressing, you will find skin staples. You should leave these staples in place. They will be taken out in the office when the wound has healed. Do not pick, rub, or scrub at them. Do not put cream or lotion over the surgical area. After one week and once the dressing is off, it is okay to let soap and water  run over your incision. Again, do not pick, scrub, or rub at the staples when bathing. Do not submerge (e.g., take a bath, swim, go in a hot tub, etc.) until six weeks after surgery. There may be some bloody drainage from the incision into the dressing after surgery. This is normal. You do not need to replace the dressing. Continue to leave it in place for the one week as instructed above. Should the dressing become saturated with blood or drainage, please call the office for further instructions.   Medications: You have been prescribed oxycodone . This is a narcotic pain medication and should only be taken as prescribed. You should not drink alcohol or operate heavy machinery (including driving) while taking this medication. The oxycodone  can cause constipation as a side effect. For that reason, you have been prescribed senna and miralax . These are both laxatives. You do not need to take this medication if you develop diarrhea. Should you remain constipated  even while taking the senna and miralax , please use the miralax  twice daily. Tylenol  has been prescribed to be taken every 8 hours, which will give you additional pain relief.   You have been prescribed aspirin  as a blood thinner. This medication is to be taken to prevent blood clots. Take 81 milligrams twice daily. You should refrain from using other blood thinners (warfarin, apixaban, plavix , xarelto, etc.) while using the aspirin . You will need to take this medication for a total of 6 weeks after your surgery.   You should not use over-the-counter NSAIDs (ibuprofen, Aleve, Celebrex, naproxen, meloxicam, etc.) for pain relief because aspirin  is a similar medication. There can be side effects including but not limited to kidney injury and ulcers if you take these type of medications with the aspirin .  In order to set expectations for opioid prescriptions, you will only be prescribed opioids for a total of six weeks after surgery and, at two-weeks after surgery, your opioid prescription will start to tapered (decreased dosage and number of pills). If you have ongoing need for opioid medication six weeks after surgery, you will be referred to pain management. If you are already established with a provider that is giving you opioid medications, you should schedule an appointment with them for six weeks after surgery if you feel you are going to need another prescription. State law only allows for opioid prescriptions one week at a time. If you are running out of opioid medication near the end of the week, please call the office during business hours before  running out so I can send you another prescription.   Diet: You are safe to resume your regular diet after surgery.   Reasons to Call the Office After Surgery: You should feel free to call the office with any concerns or questions you have in the post-operative period, but you should definitely notify the office if you develop: -shortness of breath, chest  pain, or trouble breathing -excessive bleeding, drainage, redness, or swelling around the surgical site -fevers, chills, or pain that is getting worse with each passing day -persistent nausea or vomiting -new weakness in the left lower extremity, new or worsening numbness or tingling in the left lower extremity -other concerns about your surgery  Follow Up Appointments: You should have an office appointment scheduled with Dr. Georgina on 04/15/2024 at 10:15am. The office location and phone number are listed below. Please arrive on time to your appointment.   Office Information:  -Ozell Georgina, MD -Phone number: [phone] -Address: [address]       [place], [place] [address]

## 2024-03-29 NOTE — ED Notes (Signed)
 ED Provider at bedside.

## 2024-03-29 NOTE — H&P (Incomplete)
 History and Physical    Patient: [name] FMW:[id number] DOB: 11-08-1935 DOA: 03/29/2024 DOS: the patient was seen and examined on 03/30/2024 PCP: Zollie Lowers, MD  Patient coming from: Home  Chief Complaint:  Chief Complaint  Patient presents with   Fall   Hip Pain   HPI: [name] is a 88 y.o. male with medical history significant of hypertension, hyperlipidemia dementia, prior stroke who presents to the emergency department from home via EMS due to a witnessed fall where he landed on his left side and had difficulty in being able to get up. Wife at bedside, states patient has had a left great toe wound and has had an ankle swelling for about 3 weeks which has been addressed by PCP and was treated with antibiotics, but patient has been limping since onset of symptoms, so she thought this may have contributed to him losing balance that resulted in fall. EMS was activated and patient was sent to the ED for further evaluation.  Patient denies pain at bedside.  ED Course:  In the emergency department, BP was 160/77 and other vital signs were within normal range.  Workup in ED showed normocytic anemia, BMP was normal except for bicarb of 17, blood glucose 129, creatinine 0.52 (baseline creatinine of 1.2-1.3). Left knee x-ray showed no acute fracture or dislocation Left hip x-ray showed displaced, mildly angulated left femoral intertrochanteric fracture Chest x-ray showed no active disease. Patient was treated with IV fentanyl . Orthopedic surgeon (Dr. Georgina) at [facility] was consulted and recommended admitting patient to [facility] with plan to operate on patient on arrival to [facility].  N.p.o. at midnight.  [facility] was asked to admit patient.  Review of Systems: Review of systems as noted in the HPI. All other systems reviewed and are negative.   Past Medical History:  Diagnosis Date   Anxiety    Asthma    Choledocholithiasis 02/24/2013   COPD (chronic obstructive pulmonary disease) (HCC)    CVA  (cerebral vascular accident) (HCC) 2013   loss of memory   Dementia (HCC)    Diabetes mellitus    Hx-TIA (transient ischemic attack)    Hyperlipidemia    Hypertension    Past Surgical History:  Procedure Laterality Date   CATARACT EXTRACTION W/PHACO Left 04/30/2016   Procedure: CATARACT EXTRACTION PHACO AND INTRAOCULAR LENS PLACEMENT LEFT EYE CDE=9.83;  Surgeon: Cherene Mania, MD;  Location: [facility]S;  Service: Ophthalmology;  Laterality: Left;  left   CHOLECYSTECTOMY N/A 02/23/2013   Procedure: LAPAROSCOPIC CHOLECYSTECTOMY;  Surgeon: Oneil DELENA Budge, MD;  Location: [facility] ORS;  Service: General;  Laterality: N/A;   ERCP N/A 02/24/2013   Procedure: ENDOSCOPIC RETROGRADE CHOLANGIOPANCREATOGRAPHY;  Surgeon: Lamar CHRISTELLA Hollingshead, MD;  Location: [facility] OR[facility];  Service: Endoscopy;  Laterality: N/A;   ROTATOR CUFF REPAIR Right     Social History:  reports that he has never smoked. He has never used smokeless tobacco. He reports that he does not drink alcohol and does not use drugs.   Allergies  Allergen Reactions   Ace Inhibitors Swelling    Family History  Problem Relation Age of Onset   Hypertension Mother    Hypertension Father    Diabetes Sister    Diabetes Sister    Heart disease Brother    Emphysema Brother    Colon cancer Neg Hx      Prior to Admission medications   Medication Sig Start Date End Date Taking? Authorizing Provider  albuterol  (VENTOLIN  HFA) 108 (90 Base) MCG/ACT inhaler  Inhale 2 puffs into the lungs every 4 (four) hours as needed for wheezing or shortness of breath. 04/12/20   Zollie Lowers, MD  aspirin  EC 81 MG tablet Take 162 mg by mouth daily.     [provider]  atorvastatin  (LIPITOR) 40 MG tablet TAKE 1 TABLET EVERY DAY 01/16/24   Zollie Lowers, MD  Blood Glucose Calibration (ACCU-CHEK AVIVA) SOLN Use with glucometer Dx E11.9 10/28/20   Zollie Lowers, MD  ciprofloxacin  (CIPRO ) 500 MG tablet Take 1 tablet (500 mg total) by mouth 2 (two) times daily. 03/17/24   Zollie Lowers, MD  diclofenac  Sodium (VOLTAREN ) 1 % GEL Apply 2 g topically 4 (four) times daily. 05/22/19   Severa Rock HERO, [name]  fluticasone  furoate-vilanterol (BREO ELLIPTA ) 100-25 MCG/INH AEPB Inhale 1 puff into the lungs daily. 04/12/20   Zollie Lowers, MD  glucose blood (ACCU-CHEK GUIDE) test strip TEST BLOOD SUGAR TWICE DAILY Dx E11.9 12/13/21   Zollie Lowers, MD  ipratropium-albuterol  (DUONEB) 0.5-2.5 (3) MG/3ML SOLN Take 3 mLs by nebulization every 4 (four) hours as needed. 04/29/20   Kehrli, Kelsey F, PA-C  losartan  (COZAAR ) 50 MG tablet TAKE 1 TABLET EVERY DAY 01/16/24   Zollie Lowers, MD  megestrol  (MEGACE ) 400 MG/10ML suspension Take 10 mLs (400 mg total) by mouth 2 (two) times daily. For appetite stimulation 03/17/24   Zollie Lowers, MD  memantine  (NAMENDA ) 5 MG tablet Take 1 tablet (5 mg total) by mouth 2 (two) times daily. 12/26/23   Zollie Lowers, MD  QUEtiapine  (SEROQUEL ) 25 MG tablet Take 1 tablet (25 mg total) by mouth at bedtime. For sleep and for nerves 03/17/24   Zollie Lowers, MD  rivastigmine  (EXELON ) 6 MG capsule Take 1 capsule (6 mg total) by mouth 2 (two) times daily. For memory 12/26/23   Zollie Lowers, MD  tadalafil  (CIALIS ) 20 MG tablet Take 0.5-1 tablets (10-20 mg total) by mouth every other day as needed for erectile dysfunction. 12/26/23   Zollie Lowers, MD  triamcinolone  cream (KENALOG ) 0.1 % Apply 1 Application topically 3 (three) times daily. Avoid face and genitalia 09/25/23   Zollie Lowers, MD    Physical Exam: BP (!) 162/82   Pulse 76   Temp 98.2 F (36.8 C)   Resp 14   SpO2 100%   General: 88 y.o. year-old male well developed well nourished in no acute distress.  Alert and oriented x3. HEENT: NCAT, EOMI Neck: Supple, trachea medial Cardiovascular: Regular rate and rhythm with no rubs or gallops.  No thyromegaly or JVD noted. 2/4 pulses in all 4 extremities. Respiratory: Clear to auscultation with no wheezes or rales. Good inspiratory effort. Abdomen: Soft,  nontender nondistended with normal bowel sounds x4 quadrants. Muskuloskeletal: Left ankle swelling.  Noted minimal draining wound of left great toe.  Left leg shortened and externally rotated.  Tender to palpation of left hip. Neuro: No focal neurologic deficits, strength 5/5 x 4, sensation, reflexes intact Skin: No ulcerative lesions noted or rashes Psychiatry: Mood is appropriate for condition and setting            Labs on Admission:  Basic Metabolic Panel: Recent Labs  Lab 03/29/24 2155  NA 139  K 4.2  CL 108  CO2 17*  GLUCOSE 129*  BUN 23  CREATININE 1.52*  CALCIUM  8.4*   Liver Function Tests: No results for input(s): AST, ALT, ALKPHOS, BILITOT, PROT, ALBUMIN in the last 168 hours. No results for input(s): LIPASE, AMYLASE in the last 168 hours. No results for input(s):  AMMONIA in the last 168 hours. CBC: Recent Labs  Lab 03/29/24 2155  WBC 9.1  NEUTROABS 7.2  HGB 11.9*  HCT 35.3*  MCV 93.6  PLT 181   Cardiac Enzymes: No results for input(s): CKTOTAL, CKMB, CKMBINDEX, TROPONINI in the last 168 hours.  BNP (last 3 results) No results for input(s): BNP in the last 8760 hours.  ProBNP (last 3 results) No results for input(s): PROBNP in the last 8760 hours.  CBG: No results for input(s): GLUCAP in the last 168 hours.  Radiological Exams on Admission: DG Knee 1-2 Views Left Result Date: 03/29/2024 EXAM: 2 VIEW(S) XRAY OF THE LEFT KNEE 03/29/2024 10:10:26 PM COMPARISON: None available. CLINICAL HISTORY: 747648 Post-operative state 252351. Table formatting from the original note was not included. Pt arrives w/ [facility] per EMS: pt comes from home w/ c/o mechanical witnessed fall tonight \\T \ landed left hip. C/o 10/10 left hip pain. No obvious deformity per EMS. Denies thinners, denies hitting head. preop Pt arrives w/ [facility] per EMS: pt comes from home w/ c/o mechanical witnessed fall tonight \\T \ landed left hip. C/o  10/10 left hip pain. No obvious deformity per EMS. Denies thinners, denies hitting head. FINDINGS: BONES AND JOINTS: No acute fracture. No focal osseous lesion. No joint dislocation. No significant joint effusion. Mild tricompartmental degenerative changes, most prominent in the medial compartment. SOFT TISSUES: The soft tissues are unremarkable. IMPRESSION: 1. No acute fracture or dislocation. Electronically signed by: Pinkie Pebbles MD 03/29/2024 10:15 PM EDT RP Workstation: [id number]   DG Hip Unilat With Pelvis 2-3 Views Left Result Date: 03/29/2024 CLINICAL DATA:  Preop EXAM: DG HIP (WITH OR WITHOUT PELVIS) 2-3V LEFT COMPARISON:  None Available. FINDINGS: Left femoral intertrochanteric fracture with mild displacement and varus angulation. No subluxation or dislocation. Mild symmetric degenerative changes in the hips. IMPRESSION: Displaced, mildly angulated left femoral intertrochanteric fracture. Electronically Signed   By: Franky Crease M.D.   On: 03/29/2024 21:21   DG Chest 1 View Result Date: 03/29/2024 CLINICAL DATA:  Preoperative EXAM: CHEST  1 VIEW COMPARISON:  Chest x-ray 04/29/2020 FINDINGS: The heart size and mediastinal contours are within normal limits. Both lungs are clear. The visualized skeletal structures are unremarkable. IMPRESSION: No active disease. Electronically Signed   By: Greig Pique M.D.   On: 03/29/2024 21:20    EKG: I independently viewed the EKG done and my findings are as followed: Normal sinus rhythm at a rate of 80 bpm  Assessment/Plan Present on Admission:  Closed left femoral fracture (HCC)  Essential hypertension  Mixed hyperlipidemia  Dementia without behavioral disturbance (HCC)  COPD (chronic obstructive pulmonary disease) (HCC)  Principal Problem:   Closed left femoral fracture (HCC) Active Problems:   Essential hypertension   Dementia without behavioral disturbance (HCC)   Mixed hyperlipidemia   COPD (chronic obstructive pulmonary disease)  (HCC)   Intertrochanteric fracture of left femur (HCC)   Open wound of left great toe  Intertrochanteric fracture of left femur Left hip x-ray showed left femoral intertrochanteric fracture Patient was treated with IV fentanyl , continue IV Dilaudid  0.5 mg every 4 hours as needed Continue n.p.o. Orthopedic surgeon (Dr. Georgina) was consulted and recommended admitting patient to [facility] and with plan for taking patient to OR on arrival.  Open wound of left great toe Left toe x-ray showed mild soft tissue swelling with no evidence of osteomyelitis Continue vancomycin  and cefepime  Continue wound care  Essential hypertension Continue losartan   Mixed hyperlipidemia Continue Lipitor  Dementia Continue Namenda , rivastigmine   COPD Continue albuterol , Breo Ellipta   DVT prophylaxis: SCDs  Code Status: Full code  Family Communication: Wife at bedside (all questions answered to satisfaction)  Consults: Orthopedic surgeon (Dr. Georgina) -by [facility]  Severity of Illness: The appropriate patient status for this patient is INPATIENT. Inpatient status is judged to be reasonable and necessary in order to provide the required intensity of service to ensure the patient's safety. The patient's presenting symptoms, physical exam findings, and initial radiographic and laboratory data in the context of their chronic comorbidities is felt to place them at high risk for further clinical deterioration. Furthermore, it is not anticipated that the patient will be medically stable for discharge from the hospital within 2 midnights of admission.   * I certify that at the point of admission it is my clinical judgment that the patient will require inpatient hospital care spanning beyond 2 midnights from the point of admission due to high intensity of service, high risk for further deterioration and high frequency of surveillance required.*  Author: Asja Frommer, DO 03/30/2024 1:35 AM  For on call review  [email or web address].

## 2024-03-29 NOTE — Progress Notes (Signed)
 Brief Orthopedic Note  Received call from the ER this evening.  Patient had a fall and has left hip pain.  I reviewed the images.  He has a left intertrochanteric femur fracture.  This will need operative stabilization.  Patient should be admitted to medicine and transferred to [facility] for surgical management.  N.p.o. at midnight (okay for sips with meds).  NWB LLE.  Aspirin  81 mg twice daily postoperatively.  Will plan for operative fixation during the evening on 03/30/2024.   Ozell DELENA Ada, MD Orthopedic Surgeon

## 2024-03-30 ENCOUNTER — Other Ambulatory Visit: Payer: Self-pay

## 2024-03-30 ENCOUNTER — Encounter (HOSPITAL_COMMUNITY)
Admission: EM | Disposition: A | Discharge status: Skilled Nursing Facility | Payer: Self-pay | Source: Home / Self Care | Attending: Internal Medicine

## 2024-03-30 ENCOUNTER — Encounter (HOSPITAL_COMMUNITY): Payer: Self-pay | Admitting: Internal Medicine

## 2024-03-30 ENCOUNTER — Inpatient Hospital Stay (HOSPITAL_COMMUNITY)

## 2024-03-30 DIAGNOSIS — S72142A Displaced intertrochanteric fracture of left femur, initial encounter for closed fracture: Secondary | ICD-10-CM | POA: Diagnosis not present

## 2024-03-30 DIAGNOSIS — S91102A Unspecified open wound of left great toe without damage to nail, initial encounter: Secondary | ICD-10-CM

## 2024-03-30 DIAGNOSIS — I10 Essential (primary) hypertension: Secondary | ICD-10-CM

## 2024-03-30 DIAGNOSIS — F0390 Unspecified dementia without behavioral disturbance: Secondary | ICD-10-CM | POA: Diagnosis not present

## 2024-03-30 DIAGNOSIS — S91102D Unspecified open wound of left great toe without damage to nail, subsequent encounter: Secondary | ICD-10-CM

## 2024-03-30 DIAGNOSIS — F419 Anxiety disorder, unspecified: Secondary | ICD-10-CM | POA: Diagnosis not present

## 2024-03-30 DIAGNOSIS — M7989 Other specified soft tissue disorders: Secondary | ICD-10-CM | POA: Diagnosis not present

## 2024-03-30 DIAGNOSIS — J449 Chronic obstructive pulmonary disease, unspecified: Secondary | ICD-10-CM | POA: Diagnosis not present

## 2024-03-30 HISTORY — PX: INTRAMEDULLARY (IM) NAIL INTERTROCHANTERIC: SHX5875

## 2024-03-30 LAB — TYPE AND SCREEN
ABO/RH(D): B POS
Antibody Screen: NEGATIVE

## 2024-03-30 LAB — COMPREHENSIVE METABOLIC PANEL WITH GFR
ALT: 24 U/L (ref 0–44)
AST: 29 U/L (ref 15–41)
Albumin: 3.3 g/dL — ABNORMAL LOW (ref 3.5–5.0)
Alkaline Phosphatase: 76 U/L (ref 38–126)
Anion gap: 13 (ref 5–15)
BUN: 22 mg/dL (ref 8–23)
CO2: 17 mmol/L — ABNORMAL LOW (ref 22–32)
Calcium: 8.5 mg/dL — ABNORMAL LOW (ref 8.9–10.3)
Chloride: 108 mmol/L (ref 98–111)
Creatinine, Ser: 1.59 mg/dL — ABNORMAL HIGH (ref 0.61–1.24)
GFR, Estimated: 42 mL/min — ABNORMAL LOW (ref >60)
Glucose, Bld: 149 mg/dL — ABNORMAL HIGH (ref 70–99)
Potassium: 4.7 mmol/L (ref 3.5–5.1)
Sodium: 138 mmol/L (ref 135–145)
Total Bilirubin: 1 mg/dL (ref 0.0–1.2)
Total Protein: 7.4 g/dL (ref 6.5–8.1)

## 2024-03-30 LAB — CBC
HCT: 33.7 % — ABNORMAL LOW (ref 39.0–52.0)
Hemoglobin: 11.3 g/dL — ABNORMAL LOW (ref 13.0–17.0)
MCH: 30.5 pg (ref 26.0–34.0)
MCHC: 33.5 g/dL (ref 30.0–36.0)
MCV: 91.1 fL (ref 80.0–100.0)
Platelets: 221 K/uL (ref 150–400)
RBC: 3.7 MIL/uL — ABNORMAL LOW (ref 4.22–5.81)
RDW: 12.7 % (ref 11.5–15.5)
WBC: 10.3 K/uL (ref 4.0–10.5)
nRBC: 0 % (ref 0.0–0.2)

## 2024-03-30 LAB — MAGNESIUM: Magnesium: 1.9 mg/dL (ref 1.7–2.4)

## 2024-03-30 LAB — MRSA NEXT GEN BY PCR, NASAL: MRSA by PCR Next Gen: NOT DETECTED

## 2024-03-30 LAB — GLUCOSE, CAPILLARY
Glucose-Capillary: 143 mg/dL — ABNORMAL HIGH (ref 70–99)
Glucose-Capillary: 157 mg/dL — ABNORMAL HIGH (ref 70–99)

## 2024-03-30 LAB — PHOSPHORUS: Phosphorus: 2.9 mg/dL (ref 2.5–4.6)

## 2024-03-30 SURGERY — FIXATION, FRACTURE, INTERTROCHANTERIC, WITH INTRAMEDULLARY ROD
Anesthesia: General | Laterality: Left

## 2024-03-30 MED ORDER — LACTATED RINGERS IV SOLN: INTRAVENOUS | Status: DC

## 2024-03-30 MED ORDER — 0.9 % SODIUM CHLORIDE (POUR BTL) OPTIME
TOPICAL | Status: DC | PRN
  Administered 2024-03-30: 1000 mL

## 2024-03-30 MED ORDER — FLUTICASONE FUROATE-VILANTEROL 100-25 MCG/ACT IN AEPB
1.0000 | INHALATION_SPRAY | Freq: Every day | RESPIRATORY_TRACT | Status: DC
  Administered 2024-03-30 – 2024-04-03 (×5): 1 via RESPIRATORY_TRACT
  Filled 2024-03-30: qty 28

## 2024-03-30 MED ORDER — ORAL CARE MOUTH RINSE: 15.0000 mL | Freq: Once | OROMUCOSAL | Status: AC

## 2024-03-30 MED ORDER — HYDROMORPHONE HCL 1 MG/ML IJ SOLN: 0.5000 mg | INTRAMUSCULAR | Status: AC | PRN

## 2024-03-30 MED ORDER — CEFAZOLIN SODIUM-DEXTROSE 2-4 GM/100ML-% IV SOLN: 2.0000 g | Freq: Three times a day (TID) | INTRAVENOUS | Status: DC

## 2024-03-30 MED ORDER — VANCOMYCIN HCL 1000 MG IV SOLR
INTRAVENOUS | Status: DC | PRN
  Administered 2024-03-30: 1 g

## 2024-03-30 MED ORDER — RIVASTIGMINE TARTRATE 1.5 MG PO CAPS
6.0000 mg | ORAL_CAPSULE | Freq: Two times a day (BID) | ORAL | Status: DC
Start: 2024-03-30 — End: 2024-04-03
  Administered 2024-03-30 – 2024-04-03 (×9): 6 mg via ORAL
  Filled 2024-03-30 (×10): qty 4

## 2024-03-30 MED ORDER — VANCOMYCIN HCL 750 MG/150ML IV SOLN
750.0000 mg | INTRAVENOUS | Status: DC
  Administered 2024-03-31: 750 mg via INTRAVENOUS
  Filled 2024-03-30: qty 150

## 2024-03-30 MED ORDER — PHENYLEPHRINE HCL-NACL 20-0.9 MG/250ML-% IV SOLN
INTRAVENOUS | Status: DC | PRN
  Administered 2024-03-30: 25 ug/min via INTRAVENOUS

## 2024-03-30 MED ORDER — SODIUM CHLORIDE 0.9 % IV SOLN
2.0000 g | Freq: Two times a day (BID) | INTRAVENOUS | Status: DC
  Administered 2024-03-30 – 2024-04-02 (×7): 2 g via INTRAVENOUS
  Filled 2024-03-30 (×7): qty 12.5

## 2024-03-30 MED ORDER — PROPOFOL 1000 MG/100ML IV EMUL
INTRAVENOUS | Status: AC
  Filled 2024-03-30: qty 100

## 2024-03-30 MED ORDER — FENTANYL CITRATE (PF) 100 MCG/2ML IJ SOLN: 25.0000 ug | INTRAMUSCULAR | Status: DC | PRN

## 2024-03-30 MED ORDER — ENSURE SURGERY PO LIQD
237.0000 mL | Freq: Two times a day (BID) | ORAL | Status: DC
  Administered 2024-03-31 – 2024-04-03 (×6): 237 mL via ORAL
  Filled 2024-03-30 (×8): qty 237

## 2024-03-30 MED ORDER — ONDANSETRON HCL 4 MG/2ML IJ SOLN: 4.0000 mg | Freq: Four times a day (QID) | INTRAMUSCULAR | Status: DC | PRN

## 2024-03-30 MED ORDER — VANCOMYCIN HCL 1250 MG/250ML IV SOLN
1250.0000 mg | Freq: Once | INTRAVENOUS | Status: AC
  Administered 2024-03-30: 1250 mg via INTRAVENOUS
  Filled 2024-03-30: qty 250

## 2024-03-30 MED ORDER — ALBUTEROL SULFATE (2.5 MG/3ML) 0.083% IN NEBU: 2.5000 mg | INHALATION_SOLUTION | RESPIRATORY_TRACT | Status: DC | PRN

## 2024-03-30 MED ORDER — FENTANYL CITRATE (PF) 250 MCG/5ML IJ SOLN
INTRAMUSCULAR | Status: AC
  Filled 2024-03-30: qty 5

## 2024-03-30 MED ORDER — ACETAMINOPHEN 10 MG/ML IV SOLN
INTRAVENOUS | Status: DC | PRN
  Administered 2024-03-30: 1000 mg via INTRAVENOUS

## 2024-03-30 MED ORDER — MEMANTINE HCL 10 MG PO TABS
5.0000 mg | ORAL_TABLET | Freq: Two times a day (BID) | ORAL | Status: DC
  Administered 2024-03-30 – 2024-04-03 (×9): 5 mg via ORAL
  Filled 2024-03-30 (×9): qty 1

## 2024-03-30 MED ORDER — VANCOMYCIN HCL IN DEXTROSE 1-5 GM/200ML-% IV SOLN
1000.0000 mg | INTRAVENOUS | Status: DC
Start: 2024-04-01 — End: 2024-03-30

## 2024-03-30 MED ORDER — VANCOMYCIN HCL 1000 MG IV SOLR
INTRAVENOUS | Status: AC
  Filled 2024-03-30: qty 20

## 2024-03-30 MED ORDER — ROCURONIUM BROMIDE 10 MG/ML (PF) SYRINGE
PREFILLED_SYRINGE | INTRAVENOUS | Status: DC | PRN
  Administered 2024-03-30: 50 mg via INTRAVENOUS

## 2024-03-30 MED ORDER — ACETAMINOPHEN 10 MG/ML IV SOLN
INTRAVENOUS | Status: AC
Start: 2024-03-30 — End: 2024-03-30
  Filled 2024-03-30: qty 100

## 2024-03-30 MED ORDER — ASPIRIN 81 MG PO CHEW
81.0000 mg | CHEWABLE_TABLET | Freq: Two times a day (BID) | ORAL | Status: DC
  Administered 2024-03-31 – 2024-04-03 (×7): 81 mg via ORAL
  Filled 2024-03-30 (×7): qty 1

## 2024-03-30 MED ORDER — PROPOFOL 10 MG/ML IV BOLUS
INTRAVENOUS | Status: DC | PRN
  Administered 2024-03-30: 80 mg via INTRAVENOUS

## 2024-03-30 MED ORDER — FENTANYL CITRATE (PF) 250 MCG/5ML IJ SOLN
INTRAMUSCULAR | Status: DC | PRN
  Administered 2024-03-30 (×4): 50 ug via INTRAVENOUS

## 2024-03-30 MED ORDER — EPHEDRINE 5 MG/ML INJ
INTRAVENOUS | Status: AC
  Filled 2024-03-30: qty 5

## 2024-03-30 MED ORDER — LIDOCAINE 2% (20 MG/ML) 5 ML SYRINGE
INTRAMUSCULAR | Status: AC
  Filled 2024-03-30: qty 5

## 2024-03-30 MED ORDER — ONDANSETRON HCL 4 MG/2ML IJ SOLN
INTRAMUSCULAR | Status: DC | PRN
Start: 2024-03-30 — End: 2024-03-30
  Administered 2024-03-30: 4 mg via INTRAVENOUS

## 2024-03-30 MED ORDER — ONDANSETRON HCL 4 MG/2ML IJ SOLN
INTRAMUSCULAR | Status: AC
  Filled 2024-03-30: qty 2

## 2024-03-30 MED ORDER — LOSARTAN POTASSIUM 50 MG PO TABS
50.0000 mg | ORAL_TABLET | Freq: Every day | ORAL | Status: DC
  Administered 2024-03-30 – 2024-04-03 (×5): 50 mg via ORAL
  Filled 2024-03-30 (×5): qty 1

## 2024-03-30 MED ORDER — ROCURONIUM BROMIDE 10 MG/ML (PF) SYRINGE
PREFILLED_SYRINGE | INTRAVENOUS | Status: AC
  Filled 2024-03-30: qty 10

## 2024-03-30 MED ORDER — CHLORHEXIDINE GLUCONATE 0.12 % MT SOLN
OROMUCOSAL | Status: AC
  Administered 2024-03-30: 15 mL via OROMUCOSAL
  Filled 2024-03-30: qty 15

## 2024-03-30 MED ORDER — SUGAMMADEX SODIUM 200 MG/2ML IV SOLN
INTRAVENOUS | Status: DC | PRN
  Administered 2024-03-30: 150 mg via INTRAVENOUS
  Administered 2024-03-30: 50 mg via INTRAVENOUS

## 2024-03-30 MED ORDER — MIDAZOLAM HCL 2 MG/2ML IJ SOLN: 0.5000 mg | Freq: Once | INTRAMUSCULAR | Status: DC | PRN

## 2024-03-30 MED ORDER — HYDROMORPHONE HCL 1 MG/ML IJ SOLN
0.5000 mg | INTRAMUSCULAR | Status: DC | PRN
  Administered 2024-03-30 (×2): 0.5 mg via INTRAVENOUS
  Filled 2024-03-30 (×2): qty 0.5

## 2024-03-30 MED ORDER — PROPOFOL 10 MG/ML IV BOLUS
INTRAVENOUS | Status: AC
  Filled 2024-03-30: qty 20

## 2024-03-30 MED ORDER — LIDOCAINE 2% (20 MG/ML) 5 ML SYRINGE
INTRAMUSCULAR | Status: DC | PRN
  Administered 2024-03-30: 20 mg via INTRAVENOUS

## 2024-03-30 MED ORDER — ACETAMINOPHEN 500 MG PO TABS
1000.0000 mg | ORAL_TABLET | Freq: Three times a day (TID) | ORAL | Status: DC
  Administered 2024-03-31 – 2024-04-03 (×10): 1000 mg via ORAL
  Filled 2024-03-30 (×10): qty 2

## 2024-03-30 MED ORDER — OXYCODONE HCL 5 MG PO TABS: 5.0000 mg | ORAL_TABLET | Freq: Once | ORAL | Status: DC | PRN

## 2024-03-30 MED ORDER — ONDANSETRON HCL 4 MG PO TABS: 4.0000 mg | ORAL_TABLET | Freq: Four times a day (QID) | ORAL | Status: DC | PRN

## 2024-03-30 MED ORDER — ACETAMINOPHEN 325 MG PO TABS: 650.0000 mg | ORAL_TABLET | Freq: Four times a day (QID) | ORAL | Status: DC | PRN

## 2024-03-30 MED ORDER — ATORVASTATIN CALCIUM 40 MG PO TABS
40.0000 mg | ORAL_TABLET | Freq: Every day | ORAL | Status: DC
  Administered 2024-03-30 – 2024-04-03 (×5): 40 mg via ORAL
  Filled 2024-03-30 (×5): qty 1

## 2024-03-30 MED ORDER — DEXAMETHASONE SODIUM PHOSPHATE 10 MG/ML IJ SOLN
INTRAMUSCULAR | Status: AC
  Filled 2024-03-30: qty 1

## 2024-03-30 MED ORDER — PHENYLEPHRINE 80 MCG/ML (10ML) SYRINGE FOR IV PUSH (FOR BLOOD PRESSURE SUPPORT)
PREFILLED_SYRINGE | INTRAVENOUS | Status: AC
  Filled 2024-03-30: qty 10

## 2024-03-30 MED ORDER — ACETAMINOPHEN 650 MG RE SUPP: 650.0000 mg | Freq: Four times a day (QID) | RECTAL | Status: DC | PRN

## 2024-03-30 MED ORDER — CHLORHEXIDINE GLUCONATE 0.12 % MT SOLN: 15.0000 mL | Freq: Once | OROMUCOSAL | Status: AC

## 2024-03-30 MED ORDER — OXYCODONE HCL 5 MG/5ML PO SOLN: 5.0000 mg | Freq: Once | ORAL | Status: DC | PRN

## 2024-03-30 MED ORDER — TRANEXAMIC ACID-NACL 1000-0.7 MG/100ML-% IV SOLN: 1000.0000 mg | Freq: Once | INTRAVENOUS | Status: DC

## 2024-03-30 SURGICAL SUPPLY — 33 items
BIT DRILL INTERTAN LAG SCREW (BIT) IMPLANT
BIT DRILL LONG 4.0 (BIT) IMPLANT
BNDG COHESIVE 4X5 TAN STRL LF (GAUZE/BANDAGES/DRESSINGS) IMPLANT
BNDG COHESIVE 6X5 TAN NS LF (GAUZE/BANDAGES/DRESSINGS) ×2 IMPLANT
COVER PERINEAL POST (MISCELLANEOUS) ×2 IMPLANT
COVER SURGICAL LIGHT HANDLE (MISCELLANEOUS) ×2 IMPLANT
DRAPE C-ARM 42X72 X-RAY (DRAPES) ×2 IMPLANT
DRAPE C-ARMOR (DRAPES) ×2 IMPLANT
DRAPE STERI IOBAN 125X83 (DRAPES) ×2 IMPLANT
DRSG TEGADERM 4X4.5 CHG (GAUZE/BANDAGES/DRESSINGS) IMPLANT
DRSG TEGADERM 4X4.75 (GAUZE/BANDAGES/DRESSINGS) ×10 IMPLANT
DRSG XEROFORM 1X8 (GAUZE/BANDAGES/DRESSINGS) IMPLANT
DURAPREP 26ML APPLICATOR (WOUND CARE) ×2 IMPLANT
GAUZE SPONGE 4X4 12PLY STRL (GAUZE/BANDAGES/DRESSINGS) ×2 IMPLANT
GAUZE XEROFORM 1X8 LF (GAUZE/BANDAGES/DRESSINGS) ×2 IMPLANT
GLOVE INDICATOR 7.5 STRL GRN (GLOVE) ×2 IMPLANT
GLOVE SS BIOGEL STRL SZ 7.5 (GLOVE) ×2 IMPLANT
GOWN STRL SURGICAL XL XLNG (GOWN DISPOSABLE) ×2 IMPLANT
GUIDEROD BALL TIP 3.0X800 (ORTHOPEDIC DISPOSABLE SUPPLIES) IMPLANT
KIT BASIN OR (CUSTOM PROCEDURE TRAY) ×2 IMPLANT
KIT TURNOVER KIT B (KITS) ×2 IMPLANT
NAIL TRIGEN INTERTAN 10X18CM (Nail) IMPLANT
NS IRRIG 1000ML POUR BTL (IV SOLUTION) ×2 IMPLANT
PACK GENERAL/GYN (CUSTOM PROCEDURE TRAY) ×2 IMPLANT
PAD ARMBOARD POSITIONER FOAM (MISCELLANEOUS) ×2 IMPLANT
PIN GUIDE 3.2X343MM (PIN) IMPLANT
SCREW LAG COMPR KIT 105/100 (Screw) IMPLANT
SCREW TRIGEN LOW PROF 5.0X37.5 (Screw) IMPLANT
STAPLER SKIN PROX 35W (STAPLE) IMPLANT
STAPLER VISISTAT (STAPLE) ×2 IMPLANT
SUT VIC AB 0 CT1 18XCR BRD8 (SUTURE) ×2 IMPLANT
SUT VIC AB 2-0 CT1 18 (SUTURE) ×2 IMPLANT
WATER STERILE IRR 1000ML POUR (IV SOLUTION) ×2 IMPLANT

## 2024-03-30 NOTE — Anesthesia Procedure Notes (Addendum)
 Procedure Name: Intubation Date/Time: 03/30/2024 7:56 PM  Performed by: Delores Dus, CRNAPre-anesthesia Checklist: Patient identified, Emergency Drugs available, Suction available and Patient being monitored Patient Re-evaluated:Patient Re-evaluated prior to induction Oxygen  Delivery Method: Circle system utilized Preoxygenation: Pre-oxygenation with 100% oxygen  Induction Type: IV induction Ventilation: Mask ventilation without difficulty Laryngoscope Size: Mac and 4 Grade View: Grade II Tube type: Oral Tube size: 7.0 mm Number of attempts: 1 Airway Equipment and Method: Stylet and Oral airway Placement Confirmation: ETT inserted through vocal cords under direct vision, positive ETCO2 and breath sounds checked- equal and bilateral Secured at: 23 cm Tube secured with: Tape Dental Injury: Teeth and Oropharynx as per pre-operative assessment  Comments: Intubation by Dr. Leonce

## 2024-03-30 NOTE — Progress Notes (Signed)
 Orthopedic Surgery Progress Note   Assessment: Patient is a 88 y.o. male with left intertrochanteric femur fracture status post CMN   Plan: -Operative plans: complete -Diet: regular -DVT ppx: aspirin  81mg  BID -Antibiotics: ancef  x2 post-op doses -Weight bearing status: as tolerated -PT/OT evaluate and treat -Pain control -Dispo: to floor from PACU  ___________________________________________________________________________  Subjective: No acute events since surgery. Recovering in PACU. Pain controlled.    Physical Exam:  General: no acute distress, appears stated age Neurologic: alert, following commands Respiratory: unlabored breathing on room air, symmetric chest rise  MSK:   -Left lower extremity  Dressings over hip c/d/i EHL/TA/GSC intact Plantarflexes and dorsiflexes toes Sensation intact to light touch in sural, saphenous, tibial, deep peroneal, and superficial peroneal nerve distributions Foot warm and well perfused   Yesterday's total administered Morphine  Milligram Equivalents: 30   [name] Patient MRN: [id number] Date: 03/30/24

## 2024-03-30 NOTE — Progress Notes (Signed)
 Pharmacy Antibiotic Note  [name] is a 88 y.o. male admitted on 03/29/2024 with wound infection.  Pharmacy has been consulted for vanco / cefepime  dosing.  Assessment: Scr 1.59  Plan: Change Vancomycin  750 mg IV every 24 hours.  eAUC 480 / Tss 13.9 / Scr 1.59 / Vd 0.72 Cefepime  2 gram iv q12h   Temp (24hrs), Avg:98.6 F (37 C), Min:98.2 F (36.8 C), Max:99 F (37.2 C)  Recent Labs  Lab 03/29/24 2155 03/30/24 0708  WBC 9.1 10.3  CREATININE 1.52* 1.59*    Estimated Creatinine Clearance: 31.9 mL/min (A) (by C-G formula based on SCr of 1.59 mg/dL (H)).    Allergies  Allergen Reactions   Ace Inhibitors Swelling     Thank you for allowing pharmacy to be a part of this patient's care. Benedetta Heath BS, PharmD, BCPS Clinical Pharmacist 03/30/2024 8:16 AM  Contact: [phone] after 3 PM

## 2024-03-30 NOTE — Progress Notes (Signed)
 Pharmacy Antibiotic Note  [name] is a 88 y.o. male admitted on 03/29/2024 with L hip fracture and L big toe wound/infection.  Pharmacy has been consulted for Vancomycin  and Cefepime   dosing.  Plan: Vancomycin  1250 mg IV now, then 1000 mg IV q48h Cefepime  2 g IV q12h     Temp (24hrs), Avg:98.2 F (36.8 C), Min:98.2 F (36.8 C), Max:98.2 F (36.8 C)  Recent Labs  Lab 03/29/24 2155  WBC 9.1  CREATININE 1.52*    Estimated Creatinine Clearance: 33.4 mL/min (A) (by C-G formula based on SCr of 1.52 mg/dL (H)).    Allergies  Allergen Reactions   Ace Inhibitors Swelling    [name] 03/30/2024 3:35 AM

## 2024-03-30 NOTE — Progress Notes (Signed)
 Progress Note   Patient: [name] FMW:[id number] DOB: 1936/04/11 DOA: 03/29/2024     1 DOS: the patient was seen and examined on 03/30/2024   Brief hospital course: [name] is a 88 y.o. male with medical history significant of hypertension, hyperlipidemia dementia, prior stroke who presents to the emergency department from home via EMS due to a witnessed fall where he landed on his left side and had difficulty in being able to get up.  Left knee x-ray showed no acute fracture or dislocation Left hip x-ray showed displaced, mildly angulated left femoral intertrochanteric fracture. Admitted to [facility] service with orthopedic consultation.  Assessment and Plan: Intertrochanteric fracture of left femur Orthopedic consultation appreciated. He is NPO for orthopedic intervention this afternoon. Will continue pain control. PT/ OT post op per ortho.   Open wound of left great toe Left toe x-ray showed mild soft tissue swelling with no evidence of osteomyelitis Continue vancomycin  and cefepime  Continue wound care.   Essential hypertension Continue home dose losartan    Mixed hyperlipidemia Continue Lipitor   Dementia Continue Namenda , rivastigmine . He is confused, high risk for delirium post op. Delirium precautions.   COPD Continue albuterol , Breo Ellipta       Out of bed to chair. Incentive spirometry. Nursing supportive care. Fall, aspiration precautions. Diet:  Diet Orders (From admission, onward)     Start     Ordered   03/30/24 0433  Diet NPO time specified Except for: Sips with Meds  Diet effective now       Question:  Except for  Answer:  Noralyn with Meds   03/30/24 0432           DVT prophylaxis: SCDs Start: 03/30/24 0433  Level of care: Med-Surg   Code Status: Full Code  Subjective: Patient is seen and examined today morning. Confused, has mittens. Denies pain. Able to tell his name.  Physical Exam: Vitals:   03/30/24 0412 03/30/24 0737 03/30/24  1436 03/30/24 1924  BP: 135/88 (!) 137/96 (!) 141/66 132/79  Pulse: 98  87 91  Resp: 19 20 17 16   Temp: 98.7 F (37.1 C) 99 F (37.2 C) 98.3 F (36.8 C) 98.4 F (36.9 C)  TempSrc: Oral Oral Oral Oral  SpO2: 98%   100%    General - Elderly African American male, no apparent distress HEENT - PERRLA, EOMI, atraumatic head, non tender sinuses. Lung - Clear, no rales, rhonchi, wheezes. Heart - S1, S2 heard, no murmurs, rubs, trace pedal edema. Abdomen - Soft, non tender, bowel sounds good Neuro - Alert, awake and confused, non focal exam. Skin - Warm and dry.  Data Reviewed:      Latest Ref Rng & Units 03/30/2024    7:08 AM 03/29/2024    9:55 PM 03/17/2024   11:19 AM  CBC  WBC 4.0 - 10.5 K/uL 10.3  9.1  6.5   Hemoglobin 13.0 - 17.0 g/dL 88.6  88.0  87.1   Hematocrit 39.0 - 52.0 % 33.7  35.3  38.0   Platelets 150 - 400 K/uL 221  181  217       Latest Ref Rng & Units 03/30/2024    7:08 AM 03/29/2024    9:55 PM 03/17/2024   11:19 AM  BMP  Glucose 70 - 99 mg/dL 850  870  824   BUN 8 - 23 mg/dL 22  23  11    Creatinine 0.61 - 1.24 mg/dL 8.40  8.47  8.78   BUN/Creat Ratio 10 -  24   9   Sodium 135 - 145 mmol/L 138  139  139   Potassium 3.5 - 5.1 mmol/L 4.7  4.2  4.4   Chloride 98 - 111 mmol/L 108  108  104   CO2 22 - 32 mmol/L 17  17  21    Calcium  8.9 - 10.3 mg/dL 8.5  8.4  9.1    DG Toe Great Left Result Date: 03/30/2024 EXAM: 3 VIEW(S) XRAY OF THE LEFT TOES 03/30/2024 01:58:09 AM COMPARISON: None available. CLINICAL HISTORY: Open wound of left great toe. FINDINGS: BONES AND JOINTS: No acute fracture. No cortical destruction to suggest osteomyelitis. No joint dislocation. SOFT TISSUES: Mild soft tissue swelling along the first digit. IMPRESSION: 1. Mild soft tissue swelling. 2. No evidence of osteomyelitis. Electronically signed by: Pinkie Pebbles MD 03/30/2024 02:03 AM EDT RP Workstation: [id number]   DG Knee 1-2 Views Left Result Date: 03/29/2024 EXAM: 2 VIEW(S) XRAY OF THE  LEFT KNEE 03/29/2024 10:10:26 PM COMPARISON: None available. CLINICAL HISTORY: 747648 Post-operative state 252351. Table formatting from the original note was not included. Pt arrives w/ [facility] per EMS: pt comes from home w/ c/o mechanical witnessed fall tonight \\T \ landed left hip. C/o 10/10 left hip pain. No obvious deformity per EMS. Denies thinners, denies hitting head. preop Pt arrives w/ [facility] per EMS: pt comes from home w/ c/o mechanical witnessed fall tonight \\T \ landed left hip. C/o 10/10 left hip pain. No obvious deformity per EMS. Denies thinners, denies hitting head. FINDINGS: BONES AND JOINTS: No acute fracture. No focal osseous lesion. No joint dislocation. No significant joint effusion. Mild tricompartmental degenerative changes, most prominent in the medial compartment. SOFT TISSUES: The soft tissues are unremarkable. IMPRESSION: 1. No acute fracture or dislocation. Electronically signed by: Pinkie Pebbles MD 03/29/2024 10:15 PM EDT RP Workstation: [id number]   DG Hip Unilat With Pelvis 2-3 Views Left Result Date: 03/29/2024 CLINICAL DATA:  Preop EXAM: DG HIP (WITH OR WITHOUT PELVIS) 2-3V LEFT COMPARISON:  None Available. FINDINGS: Left femoral intertrochanteric fracture with mild displacement and varus angulation. No subluxation or dislocation. Mild symmetric degenerative changes in the hips. IMPRESSION: Displaced, mildly angulated left femoral intertrochanteric fracture. Electronically Signed   By: Franky Crease M.D.   On: 03/29/2024 21:21   DG Chest 1 View Result Date: 03/29/2024 CLINICAL DATA:  Preoperative EXAM: CHEST  1 VIEW COMPARISON:  Chest x-ray 04/29/2020 FINDINGS: The heart size and mediastinal contours are within normal limits. Both lungs are clear. The visualized skeletal structures are unremarkable. IMPRESSION: No active disease. Electronically Signed   By: Greig Pique M.D.   On: 03/29/2024 21:20    Family Communication: no family at  bedside.  Disposition: Status is: Inpatient Remains inpatient appropriate because: orthopedic intervention.  Planned Discharge Destination: Home with Home Health     Time spent: 43 minutes  Author: Concepcion Riser, MD 03/30/2024 8:59 PM Secure chat 7am to 7pm For on call review [email or web address].

## 2024-03-30 NOTE — Anesthesia Postprocedure Evaluation (Signed)
 Anesthesia Post Note  Patient: [name]  Procedure(s) Performed: FIXATION, FRACTURE, INTERTROCHANTERIC, WITH INTRAMEDULLARY ROD (Left)     Patient location during evaluation: PACU Anesthesia Type: General Level of consciousness: awake and alert and patient cooperative Pain management: pain level controlled Vital Signs Assessment: post-procedure vital signs reviewed and stable Respiratory status: spontaneous breathing, nonlabored ventilation and respiratory function stable Cardiovascular status: blood pressure returned to baseline and stable Postop Assessment: no apparent nausea or vomiting Anesthetic complications: no   No notable events documented.  Last Vitals:  Vitals:   03/30/24 2130 03/30/24 2145  BP: 118/81 (!) 168/62  Pulse: 87 87  Resp: 19 19  Temp: 36.7 C   SpO2: 100% 100%    Last Pain:  Vitals:   03/30/24 2145  TempSrc:   PainSc: 0-No pain                 Dontavis Tschantz,E. Jonalyn Sedlak

## 2024-03-30 NOTE — Transfer of Care (Signed)
 Immediate Anesthesia Transfer of Care Note  Patient: [name]  Procedure(s) Performed: FIXATION, FRACTURE, INTERTROCHANTERIC, WITH INTRAMEDULLARY ROD (Left)  Patient Location: PACU  Anesthesia Type:General  Level of Consciousness: awake and alert   Airway & Oxygen  Therapy: Patient Spontanous Breathing and Patient connected to face mask oxygen   Post-op Assessment: Report given to RN and Post -op Vital signs reviewed and stable  Post vital signs: Reviewed and stable  Last Vitals:  Vitals Value Taken Time  BP 118/81 03/30/24 21:30  Temp 36.7 C 03/30/24 21:30  Pulse 87 03/30/24 21:30  Resp 12 03/30/24 21:31  SpO2    Vitals shown include unfiled device data.  Last Pain:  Vitals:   03/30/24 1924  TempSrc: Oral  PainSc:          Complications: No notable events documented.

## 2024-03-30 NOTE — Anesthesia Preprocedure Evaluation (Addendum)
 Anesthesia Evaluation  Patient identified by MRN, date of birth, ID band Patient awake and Patient confused    Reviewed: Allergy & Precautions, NPO status , Patient's Chart, lab work & pertinent test results  History of Anesthesia Complications Negative for: history of anesthetic complications  Airway Mallampati: II  TM Distance: >3 FB Neck ROM: Full    Dental  (+) Missing, Dental Advisory Given   Pulmonary COPD   breath sounds clear to auscultation       Cardiovascular hypertension, Pt. on medications (-) angina  Rhythm:Regular Rate:Normal     Neuro/Psych  PSYCHIATRIC DISORDERS (Namenda ) Anxiety    Dementia TIACVA (memory issues)    GI/Hepatic negative GI ROS, Neg liver ROS,,,  Endo/Other  diabetes (glu 143)    Renal/GU Renal InsufficiencyRenal disease     Musculoskeletal   Abdominal   Peds  Hematology Hb 11.3, plt 221k   Anesthesia Other Findings   Reproductive/Obstetrics                              Anesthesia Physical Anesthesia Plan  ASA: 3  Anesthesia Plan: General   Post-op Pain Management: Ofirmev  IV (intra-op)*   Induction: Intravenous  PONV Risk Score and Plan: 2 and Ondansetron  and Dexamethasone   Airway Management Planned: Oral ETT  Additional Equipment: None  Intra-op Plan:   Post-operative Plan: Extubation in OR  Informed Consent: I have reviewed the patients History and Physical, chart, labs and discussed the procedure including the risks, benefits and alternatives for the proposed anesthesia with the patient or authorized representative who has indicated his/her understanding and acceptance.     Dental advisory given and Consent reviewed with POA  Plan Discussed with: CRNA and Surgeon  Anesthesia Plan Comments: (Discussed with patient and his wife)         Anesthesia Quick Evaluation

## 2024-03-30 NOTE — Progress Notes (Signed)
 Patient doing well in PACU. Dr. Leonce and Dr. Georgina have both been in to assess patient and have given approval for patient to return to 5N. Patient has had x-rays completed in PACU; Dr. Georgina assisted. Patient tolerated well. He has denied pain since arrival, has answered questions appropriately per his baseline, as he is documented to have dementia. Report called to Aeven Anne, RN receiving nurse. Patient was transported to 5 N via hospital bed by myself and Beckey Chard, RN, we were met in patient's room by his wife [name] and receiving nurse. Bedside assessment and updated report provided to Aeven Anne, RN. No questions, concerns or further need of assistance needed per receiving nurse. Patient in good spirits, denies pain and in no distress.

## 2024-03-30 NOTE — Consult Note (Signed)
 WOC Nurse Consult Note: Reason for Consult: L great toe wound  Wound type: full thickness L medial great toe ? Diabetic ulcer  Pressure Injury POA: NA not pressure  Measurement: see nursing flowsheet  Wound bed: appears pink  Drainage (amount, consistency, odor) see nursing flowsheet  Periwound: edema  Dressing procedure/placement/frequency: Cleanse L great toe (including webspace and 2nd digit) with Vashe wound cleanser Soila [phone]) do not rinse and allow to air dry.  Apply a piece of silver hydrofiber Soila [phone] cut to fit wound bed daily and secure with dry gauze and tape.    Patient would benefit from following with podiatry at discharge for ongoing management of this wound.    POC discussed with bedside nurse. WOC team will not follow. Re-consult if further needs arise.   Thank you,    Powell Bar MSN, RN-BC, [employer]

## 2024-03-30 NOTE — Plan of Care (Signed)

## 2024-03-30 NOTE — Progress Notes (Addendum)
 Some admission questions not done,patient with  dementia not able to answer the questions right.

## 2024-03-30 NOTE — Plan of Care (Signed)

## 2024-03-31 ENCOUNTER — Encounter (HOSPITAL_COMMUNITY): Payer: Self-pay | Admitting: Orthopedic Surgery

## 2024-03-31 ENCOUNTER — Ambulatory Visit: Admitting: Family Medicine

## 2024-03-31 DIAGNOSIS — F0390 Unspecified dementia without behavioral disturbance: Secondary | ICD-10-CM | POA: Diagnosis not present

## 2024-03-31 DIAGNOSIS — S91102D Unspecified open wound of left great toe without damage to nail, subsequent encounter: Secondary | ICD-10-CM | POA: Diagnosis not present

## 2024-03-31 DIAGNOSIS — S72142A Displaced intertrochanteric fracture of left femur, initial encounter for closed fracture: Secondary | ICD-10-CM | POA: Diagnosis not present

## 2024-03-31 DIAGNOSIS — J449 Chronic obstructive pulmonary disease, unspecified: Secondary | ICD-10-CM | POA: Diagnosis not present

## 2024-03-31 LAB — CBC
HCT: 28.4 % — ABNORMAL LOW (ref 39.0–52.0)
Hemoglobin: 9.7 g/dL — ABNORMAL LOW (ref 13.0–17.0)
MCH: 31.2 pg (ref 26.0–34.0)
MCHC: 34.2 g/dL (ref 30.0–36.0)
MCV: 91.3 fL (ref 80.0–100.0)
Platelets: 204 K/uL (ref 150–400)
RBC: 3.11 MIL/uL — ABNORMAL LOW (ref 4.22–5.81)
RDW: 12.5 % (ref 11.5–15.5)
WBC: 10.8 K/uL — ABNORMAL HIGH (ref 4.0–10.5)
nRBC: 0 % (ref 0.0–0.2)

## 2024-03-31 LAB — BASIC METABOLIC PANEL WITH GFR
Anion gap: 8 (ref 5–15)
BUN: 22 mg/dL (ref 8–23)
CO2: 19 mmol/L — ABNORMAL LOW (ref 22–32)
Calcium: 8.1 mg/dL — ABNORMAL LOW (ref 8.9–10.3)
Chloride: 106 mmol/L (ref 98–111)
Creatinine, Ser: 1.42 mg/dL — ABNORMAL HIGH (ref 0.61–1.24)
GFR, Estimated: 48 mL/min — ABNORMAL LOW (ref >60)
Glucose, Bld: 146 mg/dL — ABNORMAL HIGH (ref 70–99)
Potassium: 4.7 mmol/L (ref 3.5–5.1)
Sodium: 133 mmol/L — ABNORMAL LOW (ref 135–145)

## 2024-03-31 MED ORDER — OXYCODONE HCL 5 MG PO TABS
5.0000 mg | ORAL_TABLET | ORAL | Status: DC | PRN
  Administered 2024-03-31 – 2024-04-02 (×4): 5 mg via ORAL
  Filled 2024-03-31 (×4): qty 1

## 2024-03-31 MED ORDER — QUETIAPINE FUMARATE 25 MG PO TABS
25.0000 mg | ORAL_TABLET | Freq: Every day | ORAL | Status: DC
  Administered 2024-03-31 – 2024-04-02 (×3): 25 mg via ORAL
  Filled 2024-03-31 (×3): qty 1

## 2024-03-31 MED ORDER — MEGESTROL ACETATE 400 MG/10ML PO SUSP
400.0000 mg | Freq: Two times a day (BID) | ORAL | Status: DC
  Administered 2024-03-31 – 2024-04-03 (×7): 400 mg via ORAL
  Filled 2024-03-31 (×10): qty 10

## 2024-03-31 MED ORDER — ALPRAZOLAM 0.25 MG PO TABS
0.2500 mg | ORAL_TABLET | Freq: Two times a day (BID) | ORAL | Status: DC | PRN
  Administered 2024-03-31 – 2024-04-03 (×3): 0.25 mg via ORAL
  Filled 2024-03-31 (×3): qty 1

## 2024-03-31 MED ORDER — MIRTAZAPINE 15 MG PO TABS: 15.0000 mg | ORAL_TABLET | Freq: Every day | ORAL | Status: DC

## 2024-03-31 NOTE — Progress Notes (Signed)
 Progress Note   Patient: [name] FMW:[id number] DOB: 1936/03/18 DOA: 03/29/2024     2 DOS: the patient was seen and examined on 03/31/2024   Brief hospital course: CHAIS Newman is a 88 y.o. male with medical history significant of hypertension, hyperlipidemia dementia, prior stroke who presents to the emergency department from home via EMS due to a witnessed fall where he landed on his left side and had difficulty in being able to get up.  Left knee x-ray showed no acute fracture or dislocation Left hip x-ray showed displaced, mildly angulated left femoral intertrochanteric fracture. Admitted to [facility] service with orthopedic consultation.  Assessment and Plan: Intertrochanteric fracture of left femur- S/p ORIF and intramedullary rodding. Orthopedic follow up appreciated. Aspirin  for DVT ppx Continue pain control. PT/ OT for dc recommendations.   Open wound of left great toe Left toe x-ray showed mild soft tissue swelling with no evidence of osteomyelitis Continue cefepime . Continue wound care.   Essential hypertension Continue home dose losartan    Mixed hyperlipidemia Continue Lipitor   Dementia Continue Namenda , seroquel . Discussed with pharmacy, given high risk for prolong qt, will hold Rameron Delirium precautions.   COPD Continue albuterol , Breo Ellipta       Out of bed to chair. Incentive spirometry. Nursing supportive care. Fall, aspiration precautions. Diet:  Diet Orders (From admission, onward)     Start     Ordered   03/30/24 2232  Diet regular Fluid consistency: Thin  Diet effective now       Question:  Fluid consistency:  Answer:  Thin   03/30/24 2232           DVT prophylaxis: SCDs Start: 03/30/24 0433  Level of care: Med-Surg   Code Status: Full Code  Subjective: Patient is seen and examined today morning. Denies pain. Seems restless, has mittens. Family at bedside. Able to tell his name.  Physical Exam: Vitals:   03/30/24 2220  03/31/24 0441 03/31/24 0746 03/31/24 1502  BP: (!) 162/84 (!) 108/59 115/61 (!) 152/117  Pulse: 85 96 87 94  Resp: 18 16  16   Temp: 98.1 F (36.7 C) 98.6 F (37 C) 99.2 F (37.3 C) 98.2 F (36.8 C)  TempSrc: Oral  Oral   SpO2: 97% 100% 100%     General - Elderly African American male, no apparent distress HEENT - PERRLA, EOMI, atraumatic head, non tender sinuses. Lung - Clear, no rales, rhonchi, wheezes. Heart - S1, S2 heard, no murmurs, rubs, trace pedal edema. Abdomen - Soft, non tender, bowel sounds good Neuro - Alert, awake and confused, non focal exam. Skin - Warm and dry.  Data Reviewed:      Latest Ref Rng & Units 03/31/2024    4:06 AM 03/30/2024    7:08 AM 03/29/2024    9:55 PM  CBC  WBC 4.0 - 10.5 K/uL 10.8  10.3  9.1   Hemoglobin 13.0 - 17.0 g/dL 9.7  88.6  88.0   Hematocrit 39.0 - 52.0 % 28.4  33.7  35.3   Platelets 150 - 400 K/uL 204  221  181       Latest Ref Rng & Units 03/31/2024    4:06 AM 03/30/2024    7:08 AM 03/29/2024    9:55 PM  BMP  Glucose 70 - 99 mg/dL 853  850  870   BUN 8 - 23 mg/dL 22  22  23    Creatinine 0.61 - 1.24 mg/dL 8.57  8.40  8.47   Sodium 135 -  145 mmol/L 133  138  139   Potassium 3.5 - 5.1 mmol/L 4.7  4.7  4.2   Chloride 98 - 111 mmol/L 106  108  108   CO2 22 - 32 mmol/L 19  17  17    Calcium  8.9 - 10.3 mg/dL 8.1  8.5  8.4    DG HIP UNILAT WITH PELVIS 2-3 VIEWS LEFT Result Date: 03/30/2024 CLINICAL DATA:  Postoperative left hip fixation. EXAM: DG HIP (WITH OR WITHOUT PELVIS) 2-3V LEFT COMPARISON:  Intraoperative fluoroscopy 03/30/2024. Left hip radiographs 03/29/2024 FINDINGS: Interval postoperative changes with internal fixation of the left hip using short intramedullary rod with femoral neck compression screws and distal locking screw. Near anatomic alignment of the left hip is demonstrated postoperatively with displacement of the lesser trochanteric fracture fragment. No dislocation at the hip joint. Surgical hardware appears  intact. Skin clips and soft tissue gas are consistent with recent surgery. Vascular calcifications are present. IMPRESSION: Postoperative changes with internal fixation of inter trochanteric fractures of the left proximal femur. Electronically Signed   By: Elsie Gravely M.D.   On: 03/30/2024 23:23   DG HIP UNILAT WITH PELVIS 2-3 VIEWS LEFT Result Date: 03/30/2024 CLINICAL DATA:  Elective surgery.  Intramedullary left femur. EXAM: DG HIP (WITH OR WITHOUT PELVIS) 2-3V LEFT COMPARISON:  03/29/2024 FINDINGS: Intraoperative fluoroscopy of the left hip is obtained for surgical control purposes. Fluoroscopy time is recorded at 87.5 seconds. Dose 19.82 mGy. Five spot fluoroscopic images are obtained. Spot fluoroscopic images demonstrate internal fixation of inter trochanteric fracture of the left hip using short intramedullary rod with compression bolts and distal locking screw. Near anatomic alignment is suggested. No dislocation at the hip joint. IMPRESSION: Intraoperative fluoroscopy is utilized for surgical control purposes demonstrating internal fixation of inter trochanteric fractures of the proximal left femur. Electronically Signed   By: Elsie Gravely M.D.   On: 03/30/2024 23:22   DG C-Arm 1-60 Min-No Report Result Date: 03/30/2024 Fluoroscopy was utilized by the requesting physician.  No radiographic interpretation.   DG Toe Great Left Result Date: 03/30/2024 EXAM: 3 VIEW(S) XRAY OF THE LEFT TOES 03/30/2024 01:58:09 AM COMPARISON: None available. CLINICAL HISTORY: Open wound of left great toe. FINDINGS: BONES AND JOINTS: No acute fracture. No cortical destruction to suggest osteomyelitis. No joint dislocation. SOFT TISSUES: Mild soft tissue swelling along the first digit. IMPRESSION: 1. Mild soft tissue swelling. 2. No evidence of osteomyelitis. Electronically signed by: Pinkie Pebbles MD 03/30/2024 02:03 AM EDT RP Workstation: [id number]   DG Knee 1-2 Views Left Result Date: 03/29/2024 EXAM: 2  VIEW(S) XRAY OF THE LEFT KNEE 03/29/2024 10:10:26 PM COMPARISON: None available. CLINICAL HISTORY: 747648 Post-operative state 252351. Table formatting from the original note was not included. Pt arrives w/ [facility] per EMS: pt comes from home w/ c/o mechanical witnessed fall tonight \\T \ landed left hip. C/o 10/10 left hip pain. No obvious deformity per EMS. Denies thinners, denies hitting head. preop Pt arrives w/ [facility] per EMS: pt comes from home w/ c/o mechanical witnessed fall tonight \\T \ landed left hip. C/o 10/10 left hip pain. No obvious deformity per EMS. Denies thinners, denies hitting head. FINDINGS: BONES AND JOINTS: No acute fracture. No focal osseous lesion. No joint dislocation. No significant joint effusion. Mild tricompartmental degenerative changes, most prominent in the medial compartment. SOFT TISSUES: The soft tissues are unremarkable. IMPRESSION: 1. No acute fracture or dislocation. Electronically signed by: Pinkie Pebbles MD 03/29/2024 10:15 PM EDT RP Workstation: [id number]   DG Hip Unilat  With Pelvis 2-3 Views Left Result Date: 03/29/2024 CLINICAL DATA:  Preop EXAM: DG HIP (WITH OR WITHOUT PELVIS) 2-3V LEFT COMPARISON:  None Available. FINDINGS: Left femoral intertrochanteric fracture with mild displacement and varus angulation. No subluxation or dislocation. Mild symmetric degenerative changes in the hips. IMPRESSION: Displaced, mildly angulated left femoral intertrochanteric fracture. Electronically Signed   By: Franky Crease M.D.   On: 03/29/2024 21:21   DG Chest 1 View Result Date: 03/29/2024 CLINICAL DATA:  Preoperative EXAM: CHEST  1 VIEW COMPARISON:  Chest x-ray 04/29/2020 FINDINGS: The heart size and mediastinal contours are within normal limits. Both lungs are clear. The visualized skeletal structures are unremarkable. IMPRESSION: No active disease. Electronically Signed   By: Greig Pique M.D.   On: 03/29/2024 21:20    Family Communication: discussed with  family at bedside, they understand and agree.  Disposition: Status is: Inpatient Remains inpatient appropriate because: pain control, PT/ OT  Planned Discharge Destination: Home with Home Health     Time spent: 45 minutes  Author: Concepcion Riser, MD 03/31/2024 6:44 PM Secure chat 7am to 7pm For on call review [email or web address].

## 2024-03-31 NOTE — TOC CAGE-AID Note (Signed)
 Transition of Care [facility]) - CAGE-AID Screening   Patient Details  Name: [name] MRN: [id number] Date of Birth: [birth year]  [facility] CM/SW Contact:    Keshonda Monsour E Christopherjame Carnell, LCSW Phone Number: 03/31/2024, 8:54 AM   Clinical Narrative: Memory impairment.   CAGE-AID Screening: Substance Abuse Screening unable to be completed due to: : Patient unable to participate

## 2024-03-31 NOTE — Evaluation (Signed)
 Occupational Therapy Evaluation Patient Details Name: [name] MRN: [id number] DOB: [birth year] Today's Date: 03/31/2024   History of Present Illness   Pt is an 88 y.o. male presenting 9/21 after witnessed fall onto L hip. Found to have L displaced intertrochanteric femur fx. S/p ORIF with intramedullary rodding. PMH: HTN, HLD, dementia, CVA.     Clinical Impressions PTA, pt lived with spouse and daughter lived with them; pt was receiving asisst from wife for bathing, intermittently for LB ADL, and for IADL from wife and daughter. Upon eval, pt presents with decreased strength, balance, and pain limiting mobility as well as participation in BADL. Pt needing up to mod A +2 for functional mobility, min A for UB ADL and total A +2 for LB ADL. Pt to continue to benefit from acute OT services. Patient will benefit from continued inpatient follow up therapy, <3 hours/day       If plan is discharge home, recommend the following:   Two people to help with walking and/or transfers;Two people to help with bathing/dressing/bathroom;Assistance with cooking/housework;Direct supervision/assist for medications management;Direct supervision/assist for financial management;Assist for transportation;Help with stairs or ramp for entrance;Supervision due to cognitive status     Functional Status Assessment   Patient has had a recent decline in their functional status and demonstrates the ability to make significant improvements in function in a reasonable and predictable amount of time.     Equipment Recommendations   Other (comment) (defer)     Recommendations for Other Services         Precautions/Restrictions   Precautions Precautions: None Recall of Precautions/Restrictions: Impaired Restrictions Weight Bearing Restrictions Per Provider Order: Yes Other Position/Activity Restrictions: WBAT LLE     Mobility Bed Mobility Overal bed mobility: Needs Assistance Bed Mobility:  Supine to Sit, Sit to Supine     Supine to sit: Mod assist, +2 for physical assistance, HOB elevated Sit to supine: Max assist, +2 for physical assistance   General bed mobility comments: Multimodal cues for transition to EOB. Assist for LE advancement, reaching for rails, and trunk elevation to full sitting position. Bed pad utilized for scooting.    Transfers Overall transfer level: Needs assistance Equipment used: Rolling walker (2 wheels) Transfers: Sit to/from Stand Sit to Stand: Mod assist, +2 physical assistance           General transfer comment: Pt able to power up to full stand with +2 assist and RW for support. Pt able to gain/maintain standing balance fairly well while pt static. When attempting to take side steps at EOB pt required increased assist for safe mobility.      Balance Overall balance assessment: Needs assistance Sitting-balance support: Feet supported, Bilateral upper extremity supported Sitting balance-Leahy Scale: Fair Sitting balance - Comments: poor initially progressing to fair Postural control: Posterior lean Standing balance support: Bilateral upper extremity supported, Reliant on assistive device for balance, During functional activity Standing balance-Leahy Scale: Poor                             ADL either performed or assessed with clinical judgement   ADL Overall ADL's : Needs assistance/impaired Eating/Feeding: Set up;Bed level   Grooming: Minimal assistance;Sitting   Upper Body Bathing: Minimal assistance;Sitting   Lower Body Bathing: Sit to/from stand;+2 for safety/equipment;+2 for physical assistance;Maximal assistance   Upper Body Dressing : Minimal assistance;Sitting   Lower Body Dressing: Maximal assistance;+2 for physical assistance;+2 for safety/equipment;Sit to/from stand  Toilet Transfer: Moderate assistance;+2 for physical assistance;+2 for safety/equipment                   Vision          Perception Perception: Not tested       Praxis Praxis: Not tested       Pertinent Vitals/Pain Pain Assessment Pain Assessment: Faces Faces Pain Scale: Hurts even more Pain Location: L hip with bed mobility Pain Descriptors / Indicators: Aching, Sore, Tender Pain Intervention(s): Limited activity within patient's tolerance, Monitored during session     Extremity/Trunk Assessment Upper Extremity Assessment Upper Extremity Assessment: Overall WFL for tasks assessed   Lower Extremity Assessment Lower Extremity Assessment: Defer to PT evaluation LLE Deficits / Details: Acute pain, decreased strength and AROM consistent with pre-op diagnosis and subsequent surgery.   Cervical / Trunk Assessment Cervical / Trunk Assessment: Kyphotic (Forward head posture with rounded shoulders.)   Communication Communication Communication: No apparent difficulties   Cognition Arousal: Alert Behavior During Therapy: WFL for tasks assessed/performed Cognition: History of cognitive impairments                               Following commands: Impaired Following commands impaired: Follows one step commands with increased time     Cueing  General Comments   Cueing Techniques: Verbal cues;Gestural cues      Exercises     Shoulder Instructions      Home Living Family/patient expects to be discharged to:: Private residence Living Arrangements: Spouse/significant other;Children Available Help at Discharge: Family;Available 24 hours/day Type of Home: House Home Access: Stairs to enter [employer] of Steps: 2 Entrance Stairs-Rails: Right;Left;Can reach both Home Layout: One level     Bathroom Shower/Tub: Chief Strategy Officer: Handicapped height Bathroom Accessibility: Yes (RW but it would be tight)   Home Equipment: Shower seat;Grab bars - tub/shower;Cane - single point          Prior Functioning/Environment Prior Level of Function : Needs  assist             Mobility Comments: Using the cane more frequently over the last 2-3 months ADLs Comments: Pt washes his face but wife helping with showering. Getting into the shower. Occasionally needing help with dressing.    OT Problem List: Decreased strength;Decreased activity tolerance;Impaired balance (sitting and/or standing);Decreased cognition;Decreased safety awareness;Decreased knowledge of precautions;Decreased knowledge of use of DME or AE;Pain   OT Treatment/Interventions: Self-care/ADL training;Therapeutic exercise;DME and/or AE instruction;Therapeutic activities;Patient/family education;Balance training      OT Goals(Current goals can be found in the care plan section)   Acute Rehab OT Goals OT Goal Formulation: With patient Time For Goal Achievement: 04/14/24 Potential to Achieve Goals: Good   OT Frequency:  Min 2X/week    Co-evaluation PT/OT/SLP Co-Evaluation/Treatment: Yes Reason for Co-Treatment: Necessary to address cognition/behavior during functional activity;For patient/therapist safety;To address functional/ADL transfers PT goals addressed during session: Mobility/safety with mobility;Proper use of DME OT goals addressed during session: ADL's and self-care;Strengthening/ROM      AM-PAC OT 6 Clicks Daily Activity     Outcome Measure Help from another person eating meals?: A Little Help from another person taking care of personal grooming?: A Little Help from another person toileting, which includes using toliet, bedpan, or urinal?: A Lot Help from another person bathing (including washing, rinsing, drying)?: A Lot Help from another person to put on and taking off regular upper body clothing?: A  Little Help from another person to put on and taking off regular lower body clothing?: Total 6 Click Score: 14   End of Session Equipment Utilized During Treatment: Gait belt;Rolling walker (2 wheels) Nurse Communication: Mobility status  Activity  Tolerance: Patient tolerated treatment well Patient left: in bed;with call bell/phone within reach;with bed alarm set;with restraints reapplied;with family/visitor present (soft mittens)  OT Visit Diagnosis: Unsteadiness on feet (R26.81);Muscle weakness (generalized) (M62.81);Pain;History of falling (Z91.81) Pain - Right/Left: Left Pain - part of body: Hip                Time: 8665-8593 OT Time Calculation (min): 32 min Charges:  OT General Charges $OT Visit: 1 Visit OT Evaluation $OT Eval Moderate Complexity: 1 Mod  Elma JONETTA Lebron FREDERICK, OTR/L [facility] Acute Rehabilitation Office: [phone]   Elma JONETTA Lebron 03/31/2024, 4:53 PM

## 2024-03-31 NOTE — Plan of Care (Signed)
  Problem: Clinical Measurements: Goal: Will remain free from infection Outcome: Not Progressing   Problem: Clinical Measurements: Goal: Respiratory complications will improve Outcome: Not Progressing   Problem: Activity: Goal: Risk for activity intolerance will decrease Outcome: Not Progressing   Problem: Coping: Goal: Level of anxiety will decrease Outcome: Not Progressing   Problem: Pain Managment: Goal: General experience of comfort will improve and/or be controlled Outcome: Not Progressing   Problem: Safety: Goal: Ability to remain free from injury will improve Outcome: Not Progressing

## 2024-03-31 NOTE — Progress Notes (Signed)
 Orthopedic Surgery Progress Note   Assessment: Patient is a 88 y.o. male with left intertrochanteric femur fracture status post CMN   Plan: -Operative plans: complete -Diet: regular -DVT ppx: aspirin  81mg  BID -Antibiotics: ancef  x2 post-op doses -Weight bearing status: as tolerated -PT/OT evaluate and treat -Pain control -Dispo: per primary  ___________________________________________________________________________  Subjective: No acute events overnight. Reports not having any hip pain this morning.    Physical Exam:  General: no acute distress, appears stated age, hands in mittens Neurologic: alert, following commands Respiratory: unlabored breathing on room air, symmetric chest rise  MSK:   -Left lower extremity  Dressings over hip c/d/i EHL/TA/GSC intact Plantarflexes and dorsiflexes toes Sensation intact to light touch in sural, saphenous, tibial, deep peroneal, and superficial peroneal nerve distributions Foot warm and well perfused   Yesterday's total administered Morphine  [place] Equivalents: 80   [name] Patient MRN: [id number] Date: 03/31/24

## 2024-03-31 NOTE — Evaluation (Signed)
 Physical Therapy Evaluation  Patient Details Name: [name] MRN: [id number] DOB: Nov 22, 1935 Today's Date: 03/31/2024  History of Present Illness  Pt is an 88 y.o. male presenting 9/21 after witnessed fall onto L hip. Found to have L displaced intertrochanteric femur fx. S/p ORIF with intramedullary rodding. PMH: HTN, HLD, dementia, CVA.  Clinical Impression  Pt admitted with above diagnosis. Pt currently with functional limitations due to the deficits listed below (see PT Problem List). At the time of PT eval pt was able to perform transfers and pre-gait activity at EOB with up to +2 mod assist. Pt's family present at bedside and supportive including daughter, who lives with pt and his wife. Recommend continued skilled PT follow up <3 hours/day at d/c to maximize functional independence and safety. Pt will benefit from acute skilled PT to increase their independence and safety with mobility to allow discharge.           If plan is discharge home, recommend the following: Two people to help with walking and/or transfers;Two people to help with bathing/dressing/bathroom;Assistance with cooking/housework;Direct supervision/assist for medications management;Direct supervision/assist for financial management;Assist for transportation;Help with stairs or ramp for entrance;Supervision due to cognitive status   Can travel by private vehicle   No    Equipment Recommendations Rolling walker (2 wheels)  Recommendations for Other Services       Functional Status Assessment Patient has had a recent decline in their functional status and demonstrates the ability to make significant improvements in function in a reasonable and predictable amount of time.     Precautions / Restrictions Precautions Precautions: None Recall of Precautions/Restrictions: Impaired Restrictions Weight Bearing Restrictions Per Provider Order: Yes Other Position/Activity Restrictions: WBAT LLE      Mobility  Bed  Mobility Overal bed mobility: Needs Assistance Bed Mobility: Supine to Sit, Sit to Supine     Supine to sit: Mod assist, +2 for physical assistance, HOB elevated Sit to supine: Max assist, +2 for physical assistance   General bed mobility comments: Multimodal cues for transition to EOB. Assist for LE advancement, reaching for rails, and trunk elevation to full sitting position. Bed pad utilized for scooting.    Transfers Overall transfer level: Needs assistance Equipment used: Rolling walker (2 wheels) Transfers: Sit to/from Stand Sit to Stand: Mod assist, +2 physical assistance           General transfer comment: Pt able to power up to full stand with +2 assist and RW for support. Pt able to gain/maintain standing balance fairly well while pt static. When attempting to take side steps at EOB pt required increased assist for safe mobility.    Ambulation/Gait             Pre-gait activities: Side steps EOB with heavy +2 assist and RW for support.    Stairs            Wheelchair Mobility     Tilt Bed    Modified Rankin (Stroke Patients Only)       Balance Overall balance assessment: Needs assistance Sitting-balance support: Feet supported, Bilateral upper extremity supported Sitting balance-Leahy Scale: Fair Sitting balance - Comments: poor initially progressing to fair Postural control: Posterior lean Standing balance support: Bilateral upper extremity supported, Reliant on assistive device for balance, During functional activity Standing balance-Leahy Scale: Poor                               Pertinent  Vitals/Pain Pain Assessment Pain Assessment: Faces Faces Pain Scale: Hurts even more Pain Location: L hip with bed mobility Pain Descriptors / Indicators: Aching, Sore, Tender Pain Intervention(s): Limited activity within patient's tolerance, Monitored during session, Repositioned    Home Living Family/patient expects to be discharged  to:: Private residence Living Arrangements: Spouse/significant other;Children Available Help at Discharge: Family;Available 24 hours/day Type of Home: House Home Access: Stairs to enter Entrance Stairs-Rails: Right;Left;Can reach both Entrance Stairs-Number of Steps: 2   Home Layout: One level Home Equipment: Shower seat;Grab bars - tub/shower;Cane - single point      Prior Function Prior Level of Function : Needs assist             Mobility Comments: Using the cane more frequently over the last 2-3 months ADLs Comments: Pt washes his face but wife helping with showering. Getting into the shower. Occasionally needing help with dressing.     Extremity/Trunk Assessment   Upper Extremity Assessment Upper Extremity Assessment: Defer to OT evaluation    Lower Extremity Assessment Lower Extremity Assessment: LLE deficits/detail LLE Deficits / Details: Acute pain, decreased strength and AROM consistent with pre-op diagnosis and subsequent surgery.    Cervical / Trunk Assessment Cervical / Trunk Assessment: Kyphotic (Forward head posture with rounded shoulders.)  Communication   Communication Communication: No apparent difficulties    Cognition Arousal: Alert Behavior During Therapy: WFL for tasks assessed/performed   PT - Cognitive impairments: History of cognitive impairments                         Following commands: Impaired Following commands impaired: Follows one step commands with increased time     Cueing Cueing Techniques: Verbal cues, Gestural cues     General Comments      Exercises     Assessment/Plan    PT Assessment Patient needs continued PT services  PT Problem List Decreased strength;Decreased range of motion;Decreased activity tolerance;Decreased balance;Decreased mobility;Decreased knowledge of use of DME;Decreased safety awareness;Decreased knowledge of precautions;Pain       PT Treatment Interventions DME instruction;Stair  training;Gait training;Functional mobility training;Therapeutic activities;Therapeutic exercise;Balance training;Patient/family education    PT Goals (Current goals can be found in the Care Plan section)  Acute Rehab PT Goals Patient Stated Goal: None stated PT Goal Formulation: With patient/family Time For Goal Achievement: 04/14/24 Potential to Achieve Goals: Good    Frequency Min 2X/week     Co-evaluation PT/OT/SLP Co-Evaluation/Treatment: Yes Reason for Co-Treatment: Necessary to address cognition/behavior during functional activity;For patient/therapist safety;To address functional/ADL transfers PT goals addressed during session: Mobility/safety with mobility;Proper use of DME OT goals addressed during session: ADL's and self-care;Strengthening/ROM       AM-PAC PT 6 Clicks Mobility  Outcome Measure Help needed turning from your back to your side while in a flat bed without using bedrails?: Total Help needed moving from lying on your back to sitting on the side of a flat bed without using bedrails?: Total Help needed moving to and from a bed to a chair (including a wheelchair)?: Total Help needed standing up from a chair using your arms (e.g., wheelchair or bedside chair)?: Total Help needed to walk in hospital room?: Total Help needed climbing 3-5 steps with a railing? : Total 6 Click Score: 6    End of Session Equipment Utilized During Treatment: Gait belt Activity Tolerance: Patient tolerated treatment well Patient left: in bed;with call bell/phone within reach;with bed alarm set;with family/visitor present (Mittens re-applied) Nurse Communication: Mobility status  PT Visit Diagnosis: Unsteadiness on feet (R26.81);Pain Pain - Right/Left: Left Pain - part of body: Hip    Time: 1335-1406 PT Time Calculation (min) (ACUTE ONLY): 31 min   Charges:   PT Evaluation $PT Eval Moderate Complexity: 1 Mod   PT General Charges $$ ACUTE PT VISIT: 1 Visit         Leita Sable, PT, DPT Acute Rehabilitation Services Secure Chat Preferred Office: [phone]   Leita JONETTA Sable 03/31/2024, 3:56 PM

## 2024-04-01 DIAGNOSIS — S72142A Displaced intertrochanteric fracture of left femur, initial encounter for closed fracture: Secondary | ICD-10-CM | POA: Diagnosis not present

## 2024-04-01 DIAGNOSIS — F0390 Unspecified dementia without behavioral disturbance: Secondary | ICD-10-CM | POA: Diagnosis not present

## 2024-04-01 DIAGNOSIS — J449 Chronic obstructive pulmonary disease, unspecified: Secondary | ICD-10-CM | POA: Diagnosis not present

## 2024-04-01 DIAGNOSIS — S91102D Unspecified open wound of left great toe without damage to nail, subsequent encounter: Secondary | ICD-10-CM | POA: Diagnosis not present

## 2024-04-01 LAB — CBC
HCT: 23.1 % — ABNORMAL LOW (ref 39.0–52.0)
Hemoglobin: 7.8 g/dL — ABNORMAL LOW (ref 13.0–17.0)
MCH: 31.1 pg (ref 26.0–34.0)
MCHC: 33.8 g/dL (ref 30.0–36.0)
MCV: 92 fL (ref 80.0–100.0)
Platelets: 179 K/uL (ref 150–400)
RBC: 2.51 MIL/uL — ABNORMAL LOW (ref 4.22–5.81)
RDW: 12.4 % (ref 11.5–15.5)
WBC: 9.5 K/uL (ref 4.0–10.5)
nRBC: 0 % (ref 0.0–0.2)

## 2024-04-01 LAB — BASIC METABOLIC PANEL WITH GFR
Anion gap: 9 (ref 5–15)
BUN: 32 mg/dL — ABNORMAL HIGH (ref 8–23)
CO2: 20 mmol/L — ABNORMAL LOW (ref 22–32)
Calcium: 7.9 mg/dL — ABNORMAL LOW (ref 8.9–10.3)
Chloride: 105 mmol/L (ref 98–111)
Creatinine, Ser: 1.52 mg/dL — ABNORMAL HIGH (ref 0.61–1.24)
GFR, Estimated: 44 mL/min — ABNORMAL LOW (ref >60)
Glucose, Bld: 142 mg/dL — ABNORMAL HIGH (ref 70–99)
Potassium: 3.9 mmol/L (ref 3.5–5.1)
Sodium: 134 mmol/L — ABNORMAL LOW (ref 135–145)

## 2024-04-01 MED ORDER — OXYCODONE HCL 5 MG PO TABS: 2.5000 mg | ORAL_TABLET | ORAL | 0 refills | Status: AC | PRN

## 2024-04-01 MED ORDER — ASPIRIN 81 MG PO CHEW: 81.0000 mg | CHEWABLE_TABLET | Freq: Two times a day (BID) | ORAL | 0 refills | Status: AC

## 2024-04-01 MED ORDER — SENNA 8.6 MG PO TABS: 1.0000 | ORAL_TABLET | Freq: Two times a day (BID) | ORAL | 0 refills | Status: DC

## 2024-04-01 MED ORDER — ACETAMINOPHEN 500 MG PO TABS: 1000.0000 mg | ORAL_TABLET | Freq: Three times a day (TID) | ORAL | 0 refills | Status: DC

## 2024-04-01 MED ORDER — POLYETHYLENE GLYCOL 3350 17 G PO PACK: 17.0000 g | PACK | Freq: Every day | ORAL | 0 refills | Status: DC

## 2024-04-01 NOTE — Plan of Care (Signed)
  Problem: Clinical Measurements: Goal: Respiratory complications will improve Outcome: Progressing   Problem: Coping: Goal: Level of anxiety will decrease Outcome: Progressing   Problem: Elimination: Goal: Will not experience complications related to bowel motility Outcome: Progressing   Problem: Pain Managment: Goal: General experience of comfort will improve and/or be controlled Outcome: Progressing   Problem: Safety: Goal: Ability to remain free from injury will improve Outcome: Progressing

## 2024-04-01 NOTE — TOC Initial Note (Signed)
 Transition of Care [facility]) - Initial/Assessment Note    Patient Details  Name: [name] MRN: [id number] Date of Birth: [birth year]  Transition of Care [facility]) CM/SW Contact:    Bridget Cordella Simmonds, LCSW Phone Number: 04/01/2024, 1:14 PM  Clinical Narrative:   Pt oriented x1, only able to respond in limited basis.  CSW spoke with pt wife Rock and several daughters regarding PT recommendation for SNF.  They are agreeable to SNF, medicare choice document provided, first choice would be [facility], permission given to send out referral in the hub.  Pt from home with wife and daughter Molly, no current services.  Referral sent out in hub to [facility] options, CSW reached out to [facility] to review.                [facility] does offer bed, whole family in room, wife does want to accept this offer.   Expected Discharge Plan: Skilled Nursing Facility Barriers to Discharge: Continued Medical Work up, SNF Pending bed offer   Patient Goals and CMS Choice   CMS Medicare.gov Compare Post Acute Care list provided to:: Patient Represenative (must comment) (wife) Choice offered to / list presented to : Spouse      Expected Discharge Plan and Services In-house Referral: Clinical Social Work   Post Acute Care Choice: Skilled Nursing Facility Living arrangements for the past 2 months: Single Family Home                                      Prior Living Arrangements/Services Living arrangements for the past 2 months: Single Family Home Lives with:: Spouse, Adult Children (daughter Molly) Patient language and need for interpreter reviewed:: Yes        Need for Family Participation in Patient Care: Yes (Comment) Care giver support system in place?: Yes (comment) Current home services: Other (comment) (none) Criminal Activity/Legal Involvement Pertinent to Current Situation/Hospitalization: No - Comment as needed  Activities of Daily Living      Permission  Sought/Granted                  Emotional Assessment Appearance:: Appears stated age Attitude/Demeanor/Rapport: Engaged Affect (typically observed): Pleasant Orientation: : Oriented to Self      Admission diagnosis:  Closed left femoral fracture (HCC) [S72.92XA] Closed fracture of left hip, initial encounter [facility]) [S72.002A] Patient Active Problem List   Diagnosis Date Noted   Intertrochanteric fracture of left femur (HCC) 03/30/2024   Open wound of left great toe 03/30/2024   Asthma    COPD (chronic obstructive pulmonary disease) (HCC)    Mixed hyperlipidemia 11/27/2018   Intrinsic asthma 03/31/2015   Dementia without behavioral disturbance (HCC)    Atelectasis of right lung 01/29/2014   Pleural effusion on right 11/24/2013   Late effect of cerebrovascular accident (CVA)    Diabetes mellitus (HCC)    Essential hypertension    Hx-TIA (transient ischemic attack)    PCP:  Zollie Lowers, MD Pharmacy:   [facility] - [address] [place] - [address] [place] [address] Phone: [phone] Fax: [phone]  [facility] [place], [place] - [address] [place] [address] Phone: [phone] Fax: [phone]     Social Drivers of Health (SDOH) Social History: SDOH Screenings   Food Insecurity: No Food Insecurity (03/30/2024)  Housing: Patient Unable To Answer (03/30/2024)  Transportation Needs: No Transportation Needs (03/30/2024)  Utilities: Patient Unable To Answer (03/30/2024)  Alcohol Screen: Low Risk  (01/03/2023)  Depression (PHQ2-9): Low Risk  (03/17/2024)  Financial Resource Strain: Low Risk  (01/03/2023)  Physical Activity: Insufficiently Active (01/03/2023)  Social Connections: Patient Unable To Answer (03/30/2024)  Stress: No Stress Concern Present (01/03/2023)  Tobacco Use: Low Risk  (03/30/2024)   SDOH Interventions:     Readmission Risk Interventions     No data to  display

## 2024-04-01 NOTE — Progress Notes (Signed)
 Progress Note   Patient: [name] FMW:[id number] DOB: April 04, 1936 DOA: 03/29/2024     3 DOS: the patient was seen and examined on 04/01/2024   Brief hospital course: [name] is a 88 y.o. male with medical history significant of hypertension, hyperlipidemia dementia, prior stroke who presents to the emergency department from home via EMS due to a witnessed fall where he landed on his left side and had difficulty in being able to get up.  Left knee x-ray showed no acute fracture or dislocation Left hip x-ray showed displaced, mildly angulated left femoral intertrochanteric fracture. Admitted to [facility] service with orthopedic consultation.  Assessment and Plan: Intertrochanteric fracture of left femur- S/p ORIF and intramedullary rodding. Orthopedic follow up appreciated. Continue Aspirin  for DVT ppx Continue pain control. PT/ OT recommended SNF.   Open wound of left great toe Left toe x-ray showed mild soft tissue swelling with no evidence of osteomyelitis Continue cefepime . Continue wound care.   Essential hypertension Continue home dose losartan    Mixed hyperlipidemia Continue Lipitor   Dementia Continue Namenda , seroquel . Hold Rameron due to risk of prolong Qtc. Delirium precautions.   COPD Continue albuterol , Breo Ellipta .    Out of bed to chair. Incentive spirometry. Nursing supportive care. Fall, aspiration precautions. Diet:  Diet Orders (From admission, onward)     Start     Ordered   03/30/24 2232  Diet regular Fluid consistency: Thin  Diet effective now       Question:  Fluid consistency:  Answer:  Thin   03/30/24 2232           DVT prophylaxis: SCDs Start: 03/30/24 0433  Level of care: Med-Surg   Code Status: Full Code  Subjective: Patient is seen and examined today morning.SABRA He is resting well, calm, able to answer me. No family at bedside.   Physical Exam: Vitals:   04/01/24 0434 04/01/24 0754 04/01/24 0818 04/01/24 1511  BP: (!)  109/51 (!) 122/59  (!) 109/54  Pulse: 73 68  73  Resp: 16     Temp: 98 F (36.7 C) 98 F (36.7 C)  98.1 F (36.7 C)  TempSrc:  Oral  Oral  SpO2: 98% 98% 98% 100%    General - Elderly African American male, no apparent distress HEENT - PERRLA, EOMI, atraumatic head, non tender sinuses. Lung - Clear, no rales, rhonchi, wheezes. Heart - S1, S2 heard, no murmurs, rubs, trace pedal edema. Abdomen - Soft, non tender, bowel sounds good Neuro - Alert, awake and confused, non focal exam. Skin - Warm and dry.  Data Reviewed:      Latest Ref Rng & Units 04/01/2024    4:05 AM 03/31/2024    4:06 AM 03/30/2024    7:08 AM  CBC  WBC 4.0 - 10.5 K/uL 9.5  10.8  10.3   Hemoglobin 13.0 - 17.0 g/dL 7.8  9.7  88.6   Hematocrit 39.0 - 52.0 % 23.1  28.4  33.7   Platelets 150 - 400 K/uL 179  204  221       Latest Ref Rng & Units 04/01/2024    4:05 AM 03/31/2024    4:06 AM 03/30/2024    7:08 AM  BMP  Glucose 70 - 99 mg/dL 857  853  850   BUN 8 - 23 mg/dL 32  22  22   Creatinine 0.61 - 1.24 mg/dL 8.47  8.57  8.40   Sodium 135 - 145 mmol/L 134  133  138  Potassium 3.5 - 5.1 mmol/L 3.9  4.7  4.7   Chloride 98 - 111 mmol/L 105  106  108   CO2 22 - 32 mmol/L 20  19  17    Calcium  8.9 - 10.3 mg/dL 7.9  8.1  8.5    DG HIP UNILAT WITH PELVIS 2-3 VIEWS LEFT Result Date: 03/30/2024 CLINICAL DATA:  Postoperative left hip fixation. EXAM: DG HIP (WITH OR WITHOUT PELVIS) 2-3V LEFT COMPARISON:  Intraoperative fluoroscopy 03/30/2024. Left hip radiographs 03/29/2024 FINDINGS: Interval postoperative changes with internal fixation of the left hip using short intramedullary rod with femoral neck compression screws and distal locking screw. Near anatomic alignment of the left hip is demonstrated postoperatively with displacement of the lesser trochanteric fracture fragment. No dislocation at the hip joint. Surgical hardware appears intact. Skin clips and soft tissue gas are consistent with recent surgery. Vascular  calcifications are present. IMPRESSION: Postoperative changes with internal fixation of inter trochanteric fractures of the left proximal femur. Electronically Signed   By: Elsie Gravely M.D.   On: 03/30/2024 23:23   DG HIP UNILAT WITH PELVIS 2-3 VIEWS LEFT Result Date: 03/30/2024 CLINICAL DATA:  Elective surgery.  Intramedullary left femur. EXAM: DG HIP (WITH OR WITHOUT PELVIS) 2-3V LEFT COMPARISON:  03/29/2024 FINDINGS: Intraoperative fluoroscopy of the left hip is obtained for surgical control purposes. Fluoroscopy time is recorded at 87.5 seconds. Dose 19.82 mGy. Five spot fluoroscopic images are obtained. Spot fluoroscopic images demonstrate internal fixation of inter trochanteric fracture of the left hip using short intramedullary rod with compression bolts and distal locking screw. Near anatomic alignment is suggested. No dislocation at the hip joint. IMPRESSION: Intraoperative fluoroscopy is utilized for surgical control purposes demonstrating internal fixation of inter trochanteric fractures of the proximal left femur. Electronically Signed   By: Elsie Gravely M.D.   On: 03/30/2024 23:22   DG C-Arm 1-60 Min-No Report Result Date: 03/30/2024 Fluoroscopy was utilized by the requesting physician.  No radiographic interpretation.    Family Communication: discussed with family at bedside, they understand and agree.  Disposition: Status is: Inpatient Remains inpatient appropriate because: pain control, PT/ OT  Planned Discharge Destination: Skilled nursing facility     Time spent: 43 minutes  Author: Concepcion Riser, MD 04/01/2024 3:29 PM Secure chat 7am to 7pm For on call review [email or web address].

## 2024-04-01 NOTE — Care Management Important Message (Signed)
 Important Message  Patient Details  Name: SHAVON ZENZ MRN: [id number] Date of Birth: 08-Apr-1936   Important Message Given:  Yes - Medicare IM     Jon Cruel 04/01/2024, 3:42 PM

## 2024-04-01 NOTE — Progress Notes (Signed)
 Physical Therapy Treatment Patient Details Name: [name] MRN: [id number] DOB: 15-Jan-1936 Today's Date: 04/01/2024   History of Present Illness Pt is an 88 y.o. male presenting 9/21 after witnessed fall onto L hip. Found to have L displaced intertrochanteric femur fx. S/p ORIF with intramedullary rod placed. PMH: HTN, HLD, dementia, CVA.    PT Comments  Pt received in supine, alert and confused, family present and encouraging him, agreeable to PT session. Pt needing up to maxA to perform bed mobility and min to modA for seated scooting along EOB to simulate scoot to drop arm chair/wc. Defer standing transfer training this date due to pt c/o severe L hip pain with seated transfer and lack of +2 assist. Plan to work on stand pivot transfers vs gait training next session with RW if pain allows. Recommend staff transfer him OOB to chair daily via Stedy with pressure relief cushion in chair if pt has adequate supervision from family while up OOB. Patient will benefit from continued inpatient follow up therapy, <3 hours/day.    If plan is discharge home, recommend the following: Two people to help with walking and/or transfers;Two people to help with bathing/dressing/bathroom;Assistance with cooking/housework;Direct supervision/assist for medications management;Direct supervision/assist for financial management;Assist for transportation;Help with stairs or ramp for entrance;Supervision due to cognitive status   Can travel by private vehicle     No  Equipment Recommendations  Rolling walker (2 wheels) (consider WC pending progress)    Recommendations for Other Services       Precautions / Restrictions Precautions Precautions: None Recall of Precautions/Restrictions: Impaired Precaution/Restrictions Comments: received with mitts donned, family agreeable to notify RN if he starts pulling on lines so they can be replaced. Restrictions Weight Bearing Restrictions Per Provider Order: Yes Other  Position/Activity Restrictions: WBAT LLE     Mobility  Bed Mobility Overal bed mobility: Needs Assistance Bed Mobility: Supine to Sit, Sit to Supine     Supine to sit: HOB elevated, Max assist, Used rails Sit to supine: Mod assist, HOB elevated, Used rails   General bed mobility comments: Multimodal cues for transition to EOB, increased time. Assist for each LE advancement, reaching for rails, and trunk elevation to full sitting position. Bed pad utilized for scooting. Increased assist to sit up today due to pain/guarding. Slightly less assist to return LE back on to bed and pt able to use bed features PRN.    Transfers Overall transfer level: Needs assistance   Transfers: Bed to chair/wheelchair/BSC            Lateral/Scoot Transfers: Min assist, Mod assist General transfer comment: Worked on seated lateral and posterior scooting on EOB, defer standing due to lack of +2 assist and pt c/o severe pain with bed mobility, would be unsafe to attempt at current level of pain and assist.    Ambulation/Gait                   Stairs             Wheelchair Mobility     Tilt Bed    Modified Rankin (Stroke Patients Only)       Balance Overall balance assessment: Needs assistance Sitting-balance support: Feet supported, Bilateral upper extremity supported, No upper extremity supported Sitting balance-Leahy Scale: Poor Sitting balance - Comments: poor initially progressing to fair       Standing balance comment: defer due to pain level and lack of +2 assist  Communication Communication Communication: No apparent difficulties  Cognition Arousal: Alert Behavior During Therapy: Flat affect, Restless   PT - Cognitive impairments: History of cognitive impairments, Orientation, Awareness, Memory, Attention, Initiation, Sequencing, Problem solving, Safety/Judgement   Orientation impairments: Place, Time, Situation, Person                    PT - Cognition Comments: Not able to state DOB fully. Does state name of family members with increased time but not their relationship to him. Pt cooperative as able, frequent reinforcement of plan due to decreased recall of instructions/internal distraction due to pain. Mitts removed prior to OOB and left off at end of session as family present to assist/notify staff and pt more calm/less restless once back in supine. RN notified in case she prefers mitts on. Following commands: Impaired Following commands impaired: Follows one step commands with increased time    Cueing Cueing Techniques: Verbal cues, Gestural cues, Tactile cues  Exercises Total Joint Exercises Ankle Circles/Pumps: AROM, Both, 10 reps, Supine Heel Slides: AAROM, Both, 5 reps, Supine Hip ABduction/ADduction: AAROM, Both, 10 reps, Supine Long Arc Quad: AROM, AAROM, Both, 10 reps, Seated (AA on LLE) Knee Flexion: AROM, Both, 5 reps, Seated    General Comments General comments (skin integrity, edema, etc.): ice applied for L hip at end of session, daughter present and agreeable to remove if pt c/o ice feeling too cold or after ~20 mins      Pertinent Vitals/Pain Pain Assessment Pain Assessment: PAINAD Breathing: occasional labored breathing, short period of hyperventilation Negative Vocalization: repeated troubled calling out, loud moaning/groaning, crying Facial Expression: facial grimacing Body Language: tense, distressed pacing, fidgeting Consolability: distracted or reassured by voice/touch PAINAD Score: 7 Pain Location: L hip with bed mobility, RN notified Pain Descriptors / Indicators: Aching, Sore, Tender, Grimacing, Guarding, Moaning Pain Intervention(s): Limited activity within patient's tolerance, Monitored during session, Repositioned, Patient requesting pain meds-RN notified, Ice applied    Home Living                          Prior Function            PT Goals  (current goals can now be found in the care plan section) Acute Rehab PT Goals Patient Stated Goal: per family for him to get up with less assist and less pain PT Goal Formulation: With patient/family Time For Goal Achievement: 04/14/24 Progress towards PT goals: Progressing toward goals    Frequency    Min 2X/week      PT Plan      Co-evaluation              AM-PAC PT 6 Clicks Mobility   Outcome Measure  Help needed turning from your back to your side while in a flat bed without using bedrails?: Total Help needed moving from lying on your back to sitting on the side of a flat bed without using bedrails?: Total Help needed moving to and from a bed to a chair (including a wheelchair)?: A Lot Help needed standing up from a chair using your arms (e.g., wheelchair or bedside chair)?: Total Help needed to walk in hospital room?: Total Help needed climbing 3-5 steps with a railing? : Total 6 Click Score: 7    End of Session Equipment Utilized During Treatment:  (bed pads) Activity Tolerance: Patient tolerated treatment well;Patient limited by pain Patient left: in bed;with call bell/phone within reach;with bed alarm set;with SCD's reapplied;Other (comment) (  HOB >30 deg, daughters x2 present) Nurse Communication: Mobility status;Need for lift equipment;Patient requests pain meds;Other (comment) (mitts left off, RN can reapply if needing to, family to notify staff if he tries to pull on his lines.) PT Visit Diagnosis: Unsteadiness on feet (R26.81);Pain Pain - Right/Left: Left Pain - part of body: Hip     Time: 8290-8274 PT Time Calculation (min) (ACUTE ONLY): 16 min  Charges:    $Therapeutic Activity: 8-22 mins PT General Charges $$ ACUTE PT VISIT: 1 Visit                     Boaz Berisha P., PTA Acute Rehabilitation Services Secure Chat Preferred 9a-5:30pm Office: [phone]    Connell CHRISTELLA Blue 04/01/2024, 5:46 PM

## 2024-04-01 NOTE — NC FL2 (Signed)
 [place]  MEDICAID FL2 LEVEL OF CARE FORM     IDENTIFICATION  [name] Birthdate: April 27, 1936 Sex: male Admission Date (Current Location): 03/29/2024  [facility] and [place] Number:  Producer, television/film/video and Address:  [facility], [address] [place], [place] [address]      Provider Number: [id number]  Attending Physician Name and Address:  Darci Pore, MD  Relative Name and Phone Number:  Ezzard, Ditmer [phone]    Current Level of Care: Hospital Recommended Level of Care: Skilled Nursing Facility Prior Approval Number:    Date Approved/Denied:   PASRR Number: [id number]  Discharge Plan: SNF    Current Diagnoses: Patient Active Problem List   Diagnosis Date Noted   Intertrochanteric fracture of left femur (HCC) 03/30/2024   Open wound of left great toe 03/30/2024   Asthma    COPD (chronic obstructive pulmonary disease) (HCC)    Mixed hyperlipidemia 11/27/2018   Intrinsic asthma 03/31/2015   Dementia without behavioral disturbance (HCC)    Atelectasis of right lung 01/29/2014   Pleural effusion on right 11/24/2013   Late effect of cerebrovascular accident (CVA)    Diabetes mellitus (HCC)    Essential hypertension    Hx-TIA (transient ischemic attack)     Orientation RESPIRATION BLADDER Height & Weight     Self  Normal Continent, External catheter Weight:   Height:     BEHAVIORAL SYMPTOMS/MOOD NEUROLOGICAL BOWEL NUTRITION STATUS        Diet (see discharge summary)  AMBULATORY STATUS COMMUNICATION OF NEEDS Skin   Extensive Assist Verbally Other (Comment) (scratch marks)                       Personal Care Assistance Level of Assistance  Bathing, Feeding, Dressing Bathing Assistance: Maximum assistance Feeding assistance: Limited assistance Dressing Assistance: Maximum assistance     Functional Limitations Info  Sight, Hearing, Speech Sight Info: Adequate Hearing Info: Impaired Speech  Info: Adequate    SPECIAL CARE FACTORS FREQUENCY  PT (By licensed PT), OT (By licensed OT)     PT Frequency: 5x week OT Frequency: 5x week            Contractures Contractures Info: Not present    Additional Factors Info  Code Status, Allergies Code Status Info: full Allergies Info: ace inhibitors           Current Medications (04/01/2024):  This is the current hospital active medication list Current Facility-Administered Medications  Medication Dose Route Frequency Provider Last Rate Last Admin   acetaminophen  (TYLENOL ) tablet 1,000 mg  1,000 mg Oral Q8H Moore, Michael A, MD   1,000 mg at 04/01/24 0529   albuterol  (PROVENTIL ) (2.5 MG/3ML) 0.083% nebulizer solution 2.5 mg  2.5 mg Inhalation Q4H PRN Moore, Michael A, MD       ALPRAZolam  (XANAX ) tablet 0.25 mg  0.25 mg Oral BID PRN Darci Pore, MD   0.25 mg at 03/31/24 1100   aspirin  chewable tablet 81 mg  81 mg Oral BID Moore, Michael A, MD   81 mg at 04/01/24 0957   atorvastatin  (LIPITOR) tablet 40 mg  40 mg Oral Daily Moore, Michael A, MD   40 mg at 04/01/24 0957   ceFEPIme  (MAXIPIME ) 2 g in sodium chloride  0.9 % 100 mL IVPB  2 g Intravenous BID Moore, Michael A, MD 200 mL/hr at 04/01/24 0958 2 g at 04/01/24 0958   feeding supplement (ENSURE SURGERY) liquid 237 mL  237 mL Oral BID BM Georgina Ozell LABOR, MD   237 mL at 03/31/24 1449   fluticasone  furoate-vilanterol (BREO ELLIPTA ) 100-25 MCG/ACT 1 puff  1 puff Inhalation Daily Georgina Ozell LABOR, MD   1 puff at 04/01/24 9180   losartan  (COZAAR ) tablet 50 mg  50 mg Oral Daily Moore, Michael A, MD   50 mg at 04/01/24 0957   megestrol  (MEGACE ) 400 MG/10ML suspension 400 mg  400 mg Oral BID Sreeram, Narendranath, MD   400 mg at 04/01/24 9043   memantine  (NAMENDA ) tablet 5 mg  5 mg Oral BID Moore, Michael A, MD   5 mg at 04/01/24 0957   ondansetron  (ZOFRAN ) tablet 4 mg  4 mg Oral Q6H PRN Georgina Ozell LABOR, MD       Or   ondansetron  (ZOFRAN ) injection 4 mg  4 mg Intravenous Q6H  PRN Georgina Ozell LABOR, MD       oxyCODONE  (Oxy IR/ROXICODONE ) immediate release tablet 5 mg  5 mg Oral Q4H PRN Sreeram, Narendranath, MD   5 mg at 03/31/24 1842   QUEtiapine  (SEROQUEL ) tablet 25 mg  25 mg Oral QHS Sreeram, Narendranath, MD   25 mg at 03/31/24 2233   rivastigmine  (EXELON ) capsule 6 mg  6 mg Oral BID WC Moore, Michael A, MD   6 mg at 04/01/24 9042     Discharge Medications: Please see discharge summary for a list of discharge medications.  Relevant Imaging Results:  Relevant Lab Results:   Additional Information SSN: [id number]  Bridget Cordella Simmonds, LCSW

## 2024-04-02 DIAGNOSIS — J449 Chronic obstructive pulmonary disease, unspecified: Secondary | ICD-10-CM | POA: Diagnosis not present

## 2024-04-02 DIAGNOSIS — S91102D Unspecified open wound of left great toe without damage to nail, subsequent encounter: Secondary | ICD-10-CM | POA: Diagnosis not present

## 2024-04-02 DIAGNOSIS — S72142A Displaced intertrochanteric fracture of left femur, initial encounter for closed fracture: Secondary | ICD-10-CM | POA: Diagnosis not present

## 2024-04-02 DIAGNOSIS — F0390 Unspecified dementia without behavioral disturbance: Secondary | ICD-10-CM | POA: Diagnosis not present

## 2024-04-02 MED ORDER — DOCUSATE SODIUM 100 MG PO CAPS
100.0000 mg | ORAL_CAPSULE | Freq: Two times a day (BID) | ORAL | Status: DC
  Administered 2024-04-02 – 2024-04-03 (×3): 100 mg via ORAL
  Filled 2024-04-02 (×3): qty 1

## 2024-04-02 MED ORDER — POLYETHYLENE GLYCOL 3350 17 G PO PACK
17.0000 g | PACK | Freq: Every day | ORAL | Status: DC
Start: 2024-04-02 — End: 2024-04-03
  Administered 2024-04-02 – 2024-04-03 (×2): 17 g via ORAL
  Filled 2024-04-02 (×2): qty 1

## 2024-04-02 MED ORDER — SODIUM CHLORIDE 0.9 % IV SOLN
1.5000 g | Freq: Four times a day (QID) | INTRAVENOUS | Status: DC
Start: 2024-04-02 — End: 2024-04-03
  Administered 2024-04-02 – 2024-04-03 (×4): 1.5 g via INTRAVENOUS
  Filled 2024-04-02 (×5): qty 4

## 2024-04-02 MED ORDER — SENNOSIDES-DOCUSATE SODIUM 8.6-50 MG PO TABS
2.0000 | ORAL_TABLET | Freq: Every day | ORAL | Status: DC
Start: 2024-04-02 — End: 2024-04-03
  Administered 2024-04-02: 2 via ORAL
  Filled 2024-04-02: qty 2

## 2024-04-02 NOTE — Progress Notes (Signed)
 Physical Therapy Treatment Patient Details Name: [name] MRN: [id number] DOB: August 18, 1935 Today's Date: 04/02/2024   History of Present Illness Pt is an 88 y.o. male presenting 9/21 after witnessed fall onto L hip. Found to have L displaced intertrochanteric femur fx. S/p ORIF with intramedullary rod placed. PMH: HTN, HLD, dementia, CVA.    PT Comments  Pt received in bed, sleepy today and daughter notes that he did not sleep well last night. Pt making progress with sit>stand, stedy was used and pt able to stand with mod A +2 from bed and min A from flaps of equipment. Pregait initiated but pt is avoidant of putting wt on LLE so is unable to step RLE to ambulate yet. Pt transitioned to chair in stedy and performed LE there ex with assist. Patient will benefit from continued inpatient follow up therapy, <3 hours/day. PT will continue to follow.     If plan is discharge home, recommend the following: Two people to help with walking and/or transfers;Two people to help with bathing/dressing/bathroom;Assistance with cooking/housework;Direct supervision/assist for medications management;Direct supervision/assist for financial management;Assist for transportation;Help with stairs or ramp for entrance;Supervision due to cognitive status   Can travel by private vehicle     No  Equipment Recommendations  Rolling walker (2 wheels);Wheelchair (measurements PT);Wheelchair cushion (measurements PT)    Recommendations for Other Services       Precautions / Restrictions Precautions Precautions: Fall Recall of Precautions/Restrictions: Impaired Restrictions Weight Bearing Restrictions Per Provider Order: Yes LLE Weight Bearing Per Provider Order: Weight bearing as tolerated     Mobility  Bed Mobility Overal bed mobility: Needs Assistance Bed Mobility: Supine to Sit     Supine to sit: HOB elevated, Max assist, +2 for physical assistance     General bed mobility comments: max A +2 for  helicopter pivot to L side of bed. Pt avoidant of mvmt due to pain so max facilitation needed    Transfers Overall transfer level: Needs assistance Equipment used: Ambulation equipment used Transfers: Bed to chair/wheelchair/BSC, Sit to/from Stand Sit to Stand: Mod assist, +2 physical assistance, Min assist, From elevated surface, Via lift equipment           General transfer comment: pt stood from bed with mod A +2. Then stood numerous times from flaps of stedy with min A to work on initation of hip extension and increased standing tolerance. Pt able to obtain full upright posture Transfer via Lift Equipment: Stedy  Ambulation/Gait               General Gait Details: unable at this time, avoidant of wt shifting L to step R foot   Stairs             Wheelchair Mobility     Tilt Bed    Modified Rankin (Stroke Patients Only)       Balance Overall balance assessment: Needs assistance Sitting-balance support: Feet supported, Bilateral upper extremity supported, No upper extremity supported Sitting balance-Leahy Scale: Poor Sitting balance - Comments: min A needed EOB due to posterior lean Postural control: Posterior lean Standing balance support: Bilateral upper extremity supported, Reliant on assistive device for balance, During functional activity Standing balance-Leahy Scale: Poor                              Communication Communication Communication: No apparent difficulties  Cognition Arousal: Lethargic Behavior During Therapy: Flat affect   PT - Cognitive impairments: History  of cognitive impairments, Orientation, Awareness, Memory, Attention, Initiation, Sequencing, Problem solving, Safety/Judgement   Orientation impairments: Place, Time, Situation, Person                   PT - Cognition Comments: pt seems to be close to baseline cognition Following commands: Impaired Following commands impaired: Follows one step commands with  increased time    Cueing Cueing Techniques: Verbal cues, Gestural cues, Tactile cues  Exercises Total Joint Exercises Ankle Circles/Pumps: Both, 10 reps, AAROM, Seated Straight Leg Raises: PROM, Left, 5 reps, Supine Long Arc Quad: AROM, AAROM, Both, 10 reps, Seated (AA on LLE) Knee Flexion: AROM, Both, 5 reps, Seated    General Comments General comments (skin integrity, edema, etc.): lift pad placed under pt for event that pt is too tired to stand back up to stedy. 2 daughters present for session      Pertinent Vitals/Pain Pain Assessment Pain Assessment: Faces Faces Pain Scale: Hurts even more Pain Location: L hip with movement Pain Descriptors / Indicators: Aching, Sore, Tender, Grimacing, Guarding Pain Intervention(s): Limited activity within patient's tolerance, Monitored during session, Premedicated before session    Home Living                          Prior Function            PT Goals (current goals can now be found in the care plan section) Acute Rehab PT Goals Patient Stated Goal: per family for him to get up with less assist and less pain PT Goal Formulation: With patient/family Time For Goal Achievement: 04/14/24 Potential to Achieve Goals: Good Progress towards PT goals: Progressing toward goals    Frequency    Min 2X/week      PT Plan      Co-evaluation              AM-PAC PT 6 Clicks Mobility   Outcome Measure  Help needed turning from your back to your side while in a flat bed without using bedrails?: A Lot Help needed moving from lying on your back to sitting on the side of a flat bed without using bedrails?: Total Help needed moving to and from a bed to a chair (including a wheelchair)?: Total Help needed standing up from a chair using your arms (e.g., wheelchair or bedside chair)?: Total Help needed to walk in hospital room?: Total Help needed climbing 3-5 steps with a railing? : Total 6 Click Score: 7    End of Session  Equipment Utilized During Treatment: Gait belt Activity Tolerance: Patient limited by pain Patient left: with call bell/phone within reach;Other (comment);in chair;with chair alarm set;with family/visitor present (with lift pad under pt) Nurse Communication: Mobility status;Need for lift equipment PT Visit Diagnosis: Unsteadiness on feet (R26.81);Pain;Muscle weakness (generalized) (M62.81) Pain - Right/Left: Left Pain - part of body: Hip     Time: 9063-8985 PT Time Calculation (min) (ACUTE ONLY): 38 min  Charges:    $Gait Training: 8-22 mins $Therapeutic Exercise: 8-22 mins $Therapeutic Activity: 8-22 mins PT General Charges $$ ACUTE PT VISIT: 1 Visit                     Richerd Lipoma, PT  Acute Rehab Services Secure chat preferred Office [phone]    Richerd CROME Arietta Eisenstein 04/02/2024, 10:44 AM

## 2024-04-02 NOTE — Progress Notes (Signed)
 Progress Note   Patient: [name] FMW:[id number] DOB: [birth year] DOA: 03/29/2024     4 DOS: the patient was seen and examined on 04/02/2024   Brief hospital course: [name] is a 88 y.o. male with medical history significant of hypertension, hyperlipidemia dementia, prior stroke who presents to the emergency department from home via EMS due to a witnessed fall where he landed on his left side and had difficulty in being able to get up.  Left knee x-ray showed no acute fracture or dislocation Left hip x-ray showed displaced, mildly angulated left femoral intertrochanteric fracture. Admitted to [facility] service with orthopedic consultation.  Assessment and Plan: Intertrochanteric fracture of left femur- S/p ORIF and intramedullary rodding. Orthopedic follow up appreciated. Continue pain control. Continue Aspirin  for DVT ppx PT/ OT recommended SNF. TOC working on SNF.   Open wound of left great toe Left toe x-ray showed mild soft tissue swelling with no evidence of osteomyelitis. This is not getting better since 2 months. He will need outpatient podiatry follow up. Continue cefepime  while in patient. Continue wound care.   Essential hypertension Continue home dose losartan    Mixed hyperlipidemia Continue Lipitor   Dementia Continue Namenda , seroquel . Hold Rameron due to risk of prolong Qtc. Delirium precautions.   COPD Continue albuterol , Breo Ellipta .    Out of bed to chair. Incentive spirometry. Nursing supportive care. Fall, aspiration precautions. Diet:  Diet Orders (From admission, onward)     Start     Ordered   03/30/24 2232  Diet regular Fluid consistency: Thin  Diet effective now       Question:  Fluid consistency:  Answer:  Thin   03/30/24 2232           DVT prophylaxis: SCDs Start: 03/30/24 0433  Level of care: Med-Surg   Code Status: Full Code  Subjective: Patient is seen and examined today morning.[name] He is resting well, sitting in chair. More  sleepy, family at bedside asked about SNF dc plan.   Physical Exam: Vitals:   04/01/24 1511 04/01/24 2014 04/02/24 0425 04/02/24 0841  BP: (!) 109/54 (!) 144/67 (!) 143/76 (!) 143/62  Pulse: 73 81 83 79  Resp:  16 18 16   Temp: 98.1 F (36.7 C) 97.8 F (36.6 C) 98 F (36.7 C) 99.1 F (37.3 C)  TempSrc: Oral Oral Oral Oral  SpO2: 100% 100% 98% 100%    General - Elderly African American male, no apparent distress HEENT - PERRLA, EOMI, atraumatic head, non tender sinuses. Lung - Clear, no rales, rhonchi, wheezes. Heart - S1, S2 heard, no murmurs, rubs, trace pedal edema. Abdomen - Soft, non tender, bowel sounds good Neuro - Alert, awake and confused, non focal exam. Skin - Warm and dry. Left first toe open wound dressing.  Data Reviewed:      Latest Ref Rng & Units 04/01/2024    4:05 AM 03/31/2024    4:06 AM 03/30/2024    7:08 AM  CBC  WBC 4.0 - 10.5 K/uL 9.5  10.8  10.3   Hemoglobin 13.0 - 17.0 g/dL 7.8  9.7  88.6   Hematocrit 39.0 - 52.0 % 23.1  28.4  33.7   Platelets 150 - 400 K/uL 179  204  221       Latest Ref Rng & Units 04/01/2024    4:05 AM 03/31/2024    4:06 AM 03/30/2024    7:08 AM  BMP  Glucose 70 - 99 mg/dL 857  853  850  BUN 8 - 23 mg/dL 32  22  22   Creatinine 0.61 - 1.24 mg/dL 8.47  8.57  8.40   Sodium 135 - 145 mmol/L 134  133  138   Potassium 3.5 - 5.1 mmol/L 3.9  4.7  4.7   Chloride 98 - 111 mmol/L 105  106  108   CO2 22 - 32 mmol/L 20  19  17    Calcium  8.9 - 10.3 mg/dL 7.9  8.1  8.5    No results found.   Family Communication: discussed with family at bedside, they understand and agree.  Disposition: Status is: Inpatient Remains inpatient appropriate because: pain control, PT/ OT  Planned Discharge Destination: Skilled nursing facility     Time spent: 42 minutes  Author: Concepcion Riser, MD 04/02/2024 4:54 PM Secure chat 7am to 7pm For on call review [email or web address].

## 2024-04-02 NOTE — TOC Progression Note (Addendum)
 Transition of Care [facility]) - Progression Note    Patient Details  Name: [name] MRN: [id number] Date of Birth: 02/01/1936  Transition of Care [facility]) CM/SW Contact  Bridget Cordella Simmonds, LCSW Phone Number: 04/02/2024, 10:10 AM  Clinical Narrative:    CSW confirmed with [facility]: they can receive pt today with approved auth.  Auth request submitted in [place].   1130: SNF auth remains pending.  1240: SNF auth remains pending.  Family in room, updated.    Expected Discharge Plan: Skilled Nursing Facility Barriers to Discharge: Continued Medical Work up, SNF Pending bed offer               Expected Discharge Plan and Services In-house Referral: Clinical Social Work   Post Acute Care Choice: Skilled Nursing Facility Living arrangements for the past 2 months: Single Family Home                                       Social Drivers of Health (SDOH) Interventions SDOH Screenings   Food Insecurity: No Food Insecurity (03/30/2024)  Housing: Patient Unable To Answer (03/30/2024)  Transportation Needs: No Transportation Needs (03/30/2024)  Utilities: Patient Unable To Answer (03/30/2024)  Alcohol Screen: Low Risk  (01/03/2023)  Depression (PHQ2-9): Low Risk  (03/17/2024)  Financial Resource Strain: Low Risk  (01/03/2023)  Physical Activity: Insufficiently Active (01/03/2023)  Social Connections: Patient Unable To Answer (03/30/2024)  Stress: No Stress Concern Present (01/03/2023)  Tobacco Use: Low Risk  (03/30/2024)    Readmission Risk Interventions     No data to display

## 2024-04-02 NOTE — Plan of Care (Signed)

## 2024-04-02 NOTE — Progress Notes (Signed)
 Orthopedic Surgery Progress Note   Assessment: Patient is a 88 y.o. male with left intertrochanteric femur fracture status post CMN   Plan: -Operative plans: complete -Diet: regular -DVT ppx: aspirin  81mg  BID -Antibiotics: ancef  x2 post-op doses -Weight bearing status: as tolerated -PT/OT evaluate and treat -Pain control -Dispo: per primary  ___________________________________________________________________________  Subjective: No acute events overnight. Sleeping this morning upon entering the room. Pain well controlled.    Physical Exam:  General: no acute distress, appears stated age Neurologic: sleeping but awakes to voice, following commands Respiratory: unlabored breathing on room air, symmetric chest rise  MSK:   -Left lower extremity  Dressings over hip c/d/i EHL/TA/GSC intact Plantarflexes and dorsiflexes toes Sensation intact to light touch in sural, saphenous, tibial, deep peroneal, and superficial peroneal nerve distributions Foot warm and well perfused   Yesterday's total administered Morphine  Milligram Equivalents: 7.5   [name] Patient MRN: [id number] Date: 04/02/24

## 2024-04-03 DIAGNOSIS — Z743 Need for continuous supervision: Secondary | ICD-10-CM | POA: Diagnosis not present

## 2024-04-03 DIAGNOSIS — I129 Hypertensive chronic kidney disease with stage 1 through stage 4 chronic kidney disease, or unspecified chronic kidney disease: Secondary | ICD-10-CM | POA: Diagnosis present

## 2024-04-03 DIAGNOSIS — S72142G Displaced intertrochanteric fracture of left femur, subsequent encounter for closed fracture with delayed healing: Secondary | ICD-10-CM | POA: Diagnosis not present

## 2024-04-03 DIAGNOSIS — I6930 Unspecified sequelae of cerebral infarction: Secondary | ICD-10-CM | POA: Diagnosis not present

## 2024-04-03 DIAGNOSIS — S72142A Displaced intertrochanteric fracture of left femur, initial encounter for closed fracture: Secondary | ICD-10-CM | POA: Diagnosis not present

## 2024-04-03 DIAGNOSIS — J90 Pleural effusion, not elsewhere classified: Secondary | ICD-10-CM | POA: Diagnosis present

## 2024-04-03 DIAGNOSIS — I152 Hypertension secondary to endocrine disorders: Secondary | ICD-10-CM | POA: Diagnosis not present

## 2024-04-03 DIAGNOSIS — A419 Sepsis, unspecified organism: Secondary | ICD-10-CM | POA: Diagnosis not present

## 2024-04-03 DIAGNOSIS — K5909 Other constipation: Secondary | ICD-10-CM | POA: Diagnosis not present

## 2024-04-03 DIAGNOSIS — I639 Cerebral infarction, unspecified: Secondary | ICD-10-CM | POA: Diagnosis not present

## 2024-04-03 DIAGNOSIS — E440 Moderate protein-calorie malnutrition: Secondary | ICD-10-CM | POA: Diagnosis not present

## 2024-04-03 DIAGNOSIS — R4182 Altered mental status, unspecified: Secondary | ICD-10-CM | POA: Diagnosis not present

## 2024-04-03 DIAGNOSIS — R6520 Severe sepsis without septic shock: Secondary | ICD-10-CM | POA: Diagnosis not present

## 2024-04-03 DIAGNOSIS — Z7189 Other specified counseling: Secondary | ICD-10-CM | POA: Diagnosis not present

## 2024-04-03 DIAGNOSIS — J45998 Other asthma: Secondary | ICD-10-CM | POA: Diagnosis not present

## 2024-04-03 DIAGNOSIS — E1122 Type 2 diabetes mellitus with diabetic chronic kidney disease: Secondary | ICD-10-CM | POA: Diagnosis present

## 2024-04-03 DIAGNOSIS — R404 Transient alteration of awareness: Secondary | ICD-10-CM | POA: Diagnosis not present

## 2024-04-03 DIAGNOSIS — N184 Chronic kidney disease, stage 4 (severe): Secondary | ICD-10-CM | POA: Diagnosis present

## 2024-04-03 DIAGNOSIS — F03C0 Unspecified dementia, severe, without behavioral disturbance, psychotic disturbance, mood disturbance, and anxiety: Secondary | ICD-10-CM | POA: Diagnosis not present

## 2024-04-03 DIAGNOSIS — E872 Acidosis, unspecified: Secondary | ICD-10-CM | POA: Diagnosis present

## 2024-04-03 DIAGNOSIS — Z7982 Long term (current) use of aspirin: Secondary | ICD-10-CM | POA: Diagnosis not present

## 2024-04-03 DIAGNOSIS — R7989 Other specified abnormal findings of blood chemistry: Secondary | ICD-10-CM | POA: Diagnosis not present

## 2024-04-03 DIAGNOSIS — E870 Hyperosmolality and hypernatremia: Secondary | ICD-10-CM | POA: Diagnosis present

## 2024-04-03 DIAGNOSIS — I739 Peripheral vascular disease, unspecified: Secondary | ICD-10-CM | POA: Diagnosis not present

## 2024-04-03 DIAGNOSIS — Z558 Other problems related to education and literacy: Secondary | ICD-10-CM | POA: Diagnosis not present

## 2024-04-03 DIAGNOSIS — S91102D Unspecified open wound of left great toe without damage to nail, subsequent encounter: Secondary | ICD-10-CM | POA: Diagnosis not present

## 2024-04-03 DIAGNOSIS — I69391 Dysphagia following cerebral infarction: Secondary | ICD-10-CM | POA: Diagnosis not present

## 2024-04-03 DIAGNOSIS — G8911 Acute pain due to trauma: Secondary | ICD-10-CM | POA: Diagnosis not present

## 2024-04-03 DIAGNOSIS — L03032 Cellulitis of left toe: Secondary | ICD-10-CM | POA: Diagnosis present

## 2024-04-03 DIAGNOSIS — Z66 Do not resuscitate: Secondary | ICD-10-CM | POA: Diagnosis not present

## 2024-04-03 DIAGNOSIS — R778 Other specified abnormalities of plasma proteins: Secondary | ICD-10-CM | POA: Diagnosis not present

## 2024-04-03 DIAGNOSIS — R1312 Dysphagia, oropharyngeal phase: Secondary | ICD-10-CM | POA: Diagnosis not present

## 2024-04-03 DIAGNOSIS — J4489 Other specified chronic obstructive pulmonary disease: Secondary | ICD-10-CM | POA: Diagnosis present

## 2024-04-03 DIAGNOSIS — I700 Atherosclerosis of aorta: Secondary | ICD-10-CM | POA: Diagnosis present

## 2024-04-03 DIAGNOSIS — E875 Hyperkalemia: Secondary | ICD-10-CM | POA: Diagnosis present

## 2024-04-03 DIAGNOSIS — D62 Acute posthemorrhagic anemia: Secondary | ICD-10-CM | POA: Diagnosis not present

## 2024-04-03 DIAGNOSIS — I10 Essential (primary) hypertension: Secondary | ICD-10-CM | POA: Diagnosis not present

## 2024-04-03 DIAGNOSIS — L89151 Pressure ulcer of sacral region, stage 1: Secondary | ICD-10-CM | POA: Diagnosis present

## 2024-04-03 DIAGNOSIS — E860 Dehydration: Secondary | ICD-10-CM | POA: Diagnosis present

## 2024-04-03 DIAGNOSIS — Z789 Other specified health status: Secondary | ICD-10-CM | POA: Diagnosis not present

## 2024-04-03 DIAGNOSIS — L89322 Pressure ulcer of left buttock, stage 2: Secondary | ICD-10-CM | POA: Diagnosis present

## 2024-04-03 DIAGNOSIS — E871 Hypo-osmolality and hyponatremia: Secondary | ICD-10-CM | POA: Diagnosis present

## 2024-04-03 DIAGNOSIS — E1159 Type 2 diabetes mellitus with other circulatory complications: Secondary | ICD-10-CM | POA: Diagnosis not present

## 2024-04-03 DIAGNOSIS — G9341 Metabolic encephalopathy: Secondary | ICD-10-CM | POA: Diagnosis present

## 2024-04-03 DIAGNOSIS — J449 Chronic obstructive pulmonary disease, unspecified: Secondary | ICD-10-CM | POA: Diagnosis not present

## 2024-04-03 DIAGNOSIS — E785 Hyperlipidemia, unspecified: Secondary | ICD-10-CM | POA: Diagnosis present

## 2024-04-03 DIAGNOSIS — E782 Mixed hyperlipidemia: Secondary | ICD-10-CM | POA: Diagnosis not present

## 2024-04-03 DIAGNOSIS — I2489 Other forms of acute ischemic heart disease: Secondary | ICD-10-CM | POA: Diagnosis present

## 2024-04-03 DIAGNOSIS — S79929A Unspecified injury of unspecified thigh, initial encounter: Secondary | ICD-10-CM | POA: Diagnosis not present

## 2024-04-03 DIAGNOSIS — Z515 Encounter for palliative care: Secondary | ICD-10-CM | POA: Diagnosis not present

## 2024-04-03 DIAGNOSIS — F0390 Unspecified dementia without behavioral disturbance: Secondary | ICD-10-CM | POA: Diagnosis not present

## 2024-04-03 DIAGNOSIS — S72142D Displaced intertrochanteric fracture of left femur, subsequent encounter for closed fracture with routine healing: Secondary | ICD-10-CM | POA: Diagnosis not present

## 2024-04-03 DIAGNOSIS — I2510 Atherosclerotic heart disease of native coronary artery without angina pectoris: Secondary | ICD-10-CM | POA: Diagnosis present

## 2024-04-03 DIAGNOSIS — R531 Weakness: Secondary | ICD-10-CM | POA: Diagnosis not present

## 2024-04-03 DIAGNOSIS — R9431 Abnormal electrocardiogram [ECG] [EKG]: Secondary | ICD-10-CM | POA: Diagnosis not present

## 2024-04-03 DIAGNOSIS — N179 Acute kidney failure, unspecified: Secondary | ICD-10-CM | POA: Diagnosis present

## 2024-04-03 LAB — CBC
HCT: 25.4 % — ABNORMAL LOW (ref 39.0–52.0)
Hemoglobin: 8.6 g/dL — ABNORMAL LOW (ref 13.0–17.0)
MCH: 31.2 pg (ref 26.0–34.0)
MCHC: 33.9 g/dL (ref 30.0–36.0)
MCV: 92 fL (ref 80.0–100.0)
Platelets: 231 K/uL (ref 150–400)
RBC: 2.76 MIL/uL — ABNORMAL LOW (ref 4.22–5.81)
RDW: 12.2 % (ref 11.5–15.5)
WBC: 7.9 K/uL (ref 4.0–10.5)
nRBC: 0 % (ref 0.0–0.2)

## 2024-04-03 LAB — BASIC METABOLIC PANEL WITH GFR
Anion gap: 12 (ref 5–15)
BUN: 32 mg/dL — ABNORMAL HIGH (ref 8–23)
CO2: 16 mmol/L — ABNORMAL LOW (ref 22–32)
Calcium: 8 mg/dL — ABNORMAL LOW (ref 8.9–10.3)
Chloride: 106 mmol/L (ref 98–111)
Creatinine, Ser: 1.3 mg/dL — ABNORMAL HIGH (ref 0.61–1.24)
GFR, Estimated: 54 mL/min — ABNORMAL LOW (ref >60)
Glucose, Bld: 121 mg/dL — ABNORMAL HIGH (ref 70–99)
Potassium: 4.4 mmol/L (ref 3.5–5.1)
Sodium: 134 mmol/L — ABNORMAL LOW (ref 135–145)

## 2024-04-03 MED ORDER — ALBUTEROL SULFATE HFA 108 (90 BASE) MCG/ACT IN AERS: 2.0000 | INHALATION_SPRAY | RESPIRATORY_TRACT | 5 refills | Status: DC | PRN

## 2024-04-03 NOTE — Discharge Summary (Signed)
 Physician Discharge Summary   Patient: [name] MRN: [id number] DOB: 1936-04-08  Admit date:     03/29/2024  Discharge date: 04/03/24  Discharge Physician: Concepcion Riser   PCP: Zollie Lowers, MD   Recommendations at discharge:    PCP follow up in 1 week. Orthopedic follow up as scheduled.  Discharge Diagnoses: Principal Problem:   Open wound of left great toe Active Problems:   Essential hypertension   Dementia without behavioral disturbance (HCC)   Mixed hyperlipidemia   COPD (chronic obstructive pulmonary disease) (HCC)   Intertrochanteric fracture of left femur (HCC)  Resolved Problems:   * No resolved hospital problems. *  Hospital Course: No notes on file  Assessment and Plan: [name] is a 88 y.o. male with medical history significant of hypertension, hyperlipidemia dementia, prior stroke who presents to the emergency department from home via EMS due to a witnessed fall where he landed on his left side and had difficulty in being able to get up.  Left knee x-ray showed no acute fracture or dislocation Left hip x-ray showed displaced, mildly angulated left femoral intertrochanteric fracture. Admitted to [facility] service with orthopedic consultation.   Assessment and Plan: Intertrochanteric fracture of left femur- S/p ORIF and intramedullary rodding. Orthopedic follow up appreciated. Continue Aspirin  for DVT ppx WBAT- continue PT/ OT at rehab. Ortho follow up as scheduled. PCP follow up in 1 week.   Open wound of left great toe Left toe x-ray showed mild soft tissue swelling with no evidence of osteomyelitis. This is not getting better since 2 months. He will need outpatient podiatry follow up. Got IV cefepime  while inpatient. Continue current wound care, dressing changes..   Essential hypertension Resumed home dose losartan    Mixed hyperlipidemia Continue Lipitor   Dementia Continue Namenda , seroquel . Hold Rameron due to risk of prolong  Qtc. Delirium precautions.  CKD stage 3A- Creatinine stable.  Outpatient follow up suggested.   COPD No exacerbation. Continue albuterol .  Controlled type 2 diabetes- Last A1c 5. Watch for hypoglycemia.      Consultants: Orthopedic surgery Procedures performed: intertrochanteric fracture left femur s/p ORIF and intramedullary rodding  Disposition: Skilled nursing facility Diet recommendation:  Discharge Diet Orders (From admission, onward)     Start     Ordered   04/03/24 0000  Diet - low sodium heart healthy        04/03/24 1052           Cardiac diet DISCHARGE MEDICATION: Allergies as of 04/03/2024       Reactions   Ace Inhibitors Swelling        Medication List     STOP taking these medications    aspirin  EC 81 MG tablet Replaced by: aspirin  81 MG chewable tablet   ciprofloxacin  500 MG tablet Commonly known as: Cipro    mirtazapine  15 MG tablet Commonly known as: REMERON        TAKE these medications    acetaminophen  500 MG tablet Commonly known as: TYLENOL  Take 2 tablets (1,000 mg total) by mouth every 8 (eight) hours for 14 days. What changed:  when to take this reasons to take this   albuterol  108 (90 Base) MCG/ACT inhaler Commonly known as: VENTOLIN  HFA Inhale 2 puffs into the lungs every 4 (four) hours as needed for wheezing or shortness of breath.   aspirin  81 MG chewable tablet Chew 1 tablet (81 mg total) by mouth 2 (two) times daily. Replaces: aspirin  EC 81 MG tablet   atorvastatin  40 MG  tablet Commonly known as: LIPITOR TAKE 1 TABLET EVERY DAY   losartan  50 MG tablet Commonly known as: COZAAR  TAKE 1 TABLET EVERY DAY   megestrol  400 MG/10ML suspension Commonly known as: MEGACE  Take 10 mLs (400 mg total) by mouth 2 (two) times daily. For appetite stimulation   memantine  5 MG tablet Commonly known as: NAMENDA  Take 1 tablet (5 mg total) by mouth 2 (two) times daily.   oxyCODONE  5 MG immediate release tablet Commonly  known as: Oxy IR/ROXICODONE  Take 0.5-1 tablets (2.5-5 mg total) by mouth every 4 (four) hours as needed for up to 7 days for severe pain (pain score 7-10) or moderate pain (pain score 4-6).   polyethylene glycol 17 g packet Commonly known as: MiraLax  Take 17 g by mouth daily for 14 days.   QUEtiapine  25 MG tablet Commonly known as: SEROQUEL  Take 1 tablet (25 mg total) by mouth at bedtime. For sleep and for nerves   rivastigmine  6 MG capsule Commonly known as: EXELON  Take 1 capsule (6 mg total) by mouth 2 (two) times daily. For memory   senna 8.6 MG Tabs tablet Commonly known as: SENOKOT Take 1 tablet (8.6 mg total) by mouth in the morning and at bedtime for 14 days.   triamcinolone  cream 0.1 % Commonly known as: KENALOG  Apply 1 Application topically 3 (three) times daily. Avoid face and genitalia What changed:  when to take this reasons to take this additional instructions               Discharge Care Instructions  (From admission, onward)           Start     Ordered   04/03/24 0000  Discharge wound care:       Comments: Cleanse L great toe (including webspace and 2nd digit) with Vashe wound cleanser Soila [phone]) do not rinse and allow to air dry.  Apply a piece of silver hydrofiber Soila [phone] [employer])  cut to fit wound bed daily and secure with dry gauze and tape   04/03/24 1052            Contact information for after-discharge care     Destination     [facility] .   Service: Skilled Nursing Contact information: [address] [place]  [address] [phone]                    Discharge Exam:    04/03/2024    8:48 AM 04/03/2024    5:40 AM 04/02/2024    8:00 PM  Vitals with BMI  Systolic 147 170 841  Diastolic 68 59 86  Pulse 64 86 95   General - Elderly African American male, no apparent distress HEENT - PERRLA, EOMI, atraumatic head, non tender sinuses. Lung - Clear, no rales, rhonchi,  wheezes. Heart - S1, S2 heard, no murmurs, rubs, trace pedal edema. Abdomen - Soft, non tender, bowel sounds good Neuro - Alert, awake and confused, non focal exam. Skin - Warm and dry. Left first toe open wound dressing.  Condition at discharge: stable  The results of significant diagnostics from this hospitalization (including imaging, microbiology, ancillary and laboratory) are listed below for reference.   Imaging Studies: DG HIP UNILAT WITH PELVIS 2-3 VIEWS LEFT Result Date: 03/30/2024 CLINICAL DATA:  Postoperative left hip fixation. EXAM: DG HIP (WITH OR WITHOUT PELVIS) 2-3V LEFT COMPARISON:  Intraoperative fluoroscopy 03/30/2024. Left hip radiographs 03/29/2024 FINDINGS: Interval postoperative changes with internal fixation of the  left hip using short intramedullary rod with femoral neck compression screws and distal locking screw. Near anatomic alignment of the left hip is demonstrated postoperatively with displacement of the lesser trochanteric fracture fragment. No dislocation at the hip joint. Surgical hardware appears intact. Skin clips and soft tissue gas are consistent with recent surgery. Vascular calcifications are present. IMPRESSION: Postoperative changes with internal fixation of inter trochanteric fractures of the left proximal femur. Electronically Signed   By: Elsie Gravely M.D.   On: 03/30/2024 23:23   DG HIP UNILAT WITH PELVIS 2-3 VIEWS LEFT Result Date: 03/30/2024 CLINICAL DATA:  Elective surgery.  Intramedullary left femur. EXAM: DG HIP (WITH OR WITHOUT PELVIS) 2-3V LEFT COMPARISON:  03/29/2024 FINDINGS: Intraoperative fluoroscopy of the left hip is obtained for surgical control purposes. Fluoroscopy time is recorded at 87.5 seconds. Dose 19.82 mGy. Five spot fluoroscopic images are obtained. Spot fluoroscopic images demonstrate internal fixation of inter trochanteric fracture of the left hip using short intramedullary rod with compression bolts and distal locking screw.  Near anatomic alignment is suggested. No dislocation at the hip joint. IMPRESSION: Intraoperative fluoroscopy is utilized for surgical control purposes demonstrating internal fixation of inter trochanteric fractures of the proximal left femur. Electronically Signed   By: Elsie Gravely M.D.   On: 03/30/2024 23:22   DG C-Arm 1-60 Min-No Report Result Date: 03/30/2024 Fluoroscopy was utilized by the requesting physician.  No radiographic interpretation.   DG Toe Great Left Result Date: 03/30/2024 EXAM: 3 VIEW(S) XRAY OF THE LEFT TOES 03/30/2024 01:58:09 AM COMPARISON: None available. CLINICAL HISTORY: Open wound of left great toe. FINDINGS: BONES AND JOINTS: No acute fracture. No cortical destruction to suggest osteomyelitis. No joint dislocation. SOFT TISSUES: Mild soft tissue swelling along the first digit. IMPRESSION: 1. Mild soft tissue swelling. 2. No evidence of osteomyelitis. Electronically signed by: Pinkie Pebbles MD 03/30/2024 02:03 AM EDT RP Workstation: [id number]   DG Knee 1-2 Views Left Result Date: 03/29/2024 EXAM: 2 VIEW(S) XRAY OF THE LEFT KNEE 03/29/2024 10:10:26 PM COMPARISON: None available. CLINICAL HISTORY: 747648 Post-operative state 252351. Table formatting from the original note was not included. Pt arrives w/ [facility] per EMS: pt comes from home w/ c/o mechanical witnessed fall tonight \\T \ landed left hip. C/o 10/10 left hip pain. No obvious deformity per EMS. Denies thinners, denies hitting head. preop Pt arrives w/ [facility] per EMS: pt comes from home w/ c/o mechanical witnessed fall tonight \\T \ landed left hip. C/o 10/10 left hip pain. No obvious deformity per EMS. Denies thinners, denies hitting head. FINDINGS: BONES AND JOINTS: No acute fracture. No focal osseous lesion. No joint dislocation. No significant joint effusion. Mild tricompartmental degenerative changes, most prominent in the medial compartment. SOFT TISSUES: The soft tissues are unremarkable.  IMPRESSION: 1. No acute fracture or dislocation. Electronically signed by: Pinkie Pebbles MD 03/29/2024 10:15 PM EDT RP Workstation: [id number]   DG Hip Unilat With Pelvis 2-3 Views Left Result Date: 03/29/2024 CLINICAL DATA:  Preop EXAM: DG HIP (WITH OR WITHOUT PELVIS) 2-3V LEFT COMPARISON:  None Available. FINDINGS: Left femoral intertrochanteric fracture with mild displacement and varus angulation. No subluxation or dislocation. Mild symmetric degenerative changes in the hips. IMPRESSION: Displaced, mildly angulated left femoral intertrochanteric fracture. Electronically Signed   By: Franky Crease M.D.   On: 03/29/2024 21:21   DG Chest 1 View Result Date: 03/29/2024 CLINICAL DATA:  Preoperative EXAM: CHEST  1 VIEW COMPARISON:  Chest x-ray 04/29/2020 FINDINGS: The heart size and mediastinal contours are within normal limits.  Both lungs are clear. The visualized skeletal structures are unremarkable. IMPRESSION: No active disease. Electronically Signed   By: Greig Pique M.D.   On: 03/29/2024 21:20    Microbiology: Results for orders placed or performed during the hospital encounter of 03/29/24  MRSA Next Gen by PCR, Nasal     Status: None   Collection Time: 03/30/24  5:52 AM   Specimen: Nasal Mucosa; Nasal Swab  Result Value Ref Range Status   MRSA by PCR Next Gen NOT DETECTED NOT DETECTED Final    Comment: (NOTE) The GeneXpert MRSA Assay (FDA approved for NASAL specimens only), is one component of a comprehensive MRSA colonization surveillance program. It is not intended to diagnose MRSA infection nor to guide or monitor treatment for MRSA infections. Test performance is not FDA approved in patients less than 109 years old. Performed at [facility], [address], [place], [place] [address]     Labs: CBC: Recent Labs  Lab 03/29/24 2155 03/30/24 0708 03/31/24 0406 04/01/24 0405 04/03/24 0325  WBC 9.1 10.3 10.8* 9.5 7.9  NEUTROABS 7.2  --   --   --   --   HGB 11.9*  11.3* 9.7* 7.8* 8.6*  HCT 35.3* 33.7* 28.4* 23.1* 25.4*  MCV 93.6 91.1 91.3 92.0 92.0  PLT 181 221 204 179 231   Basic Metabolic Panel: Recent Labs  Lab 03/29/24 2155 03/30/24 0708 03/31/24 0406 04/01/24 0405 04/03/24 0325  NA 139 138 133* 134* 134*  K 4.2 4.7 4.7 3.9 4.4  CL 108 108 106 105 106  CO2 17* 17* 19* 20* 16*  GLUCOSE 129* 149* 146* 142* 121*  BUN 23 22 22  32* 32*  CREATININE 1.52* 1.59* 1.42* 1.52* 1.30*  CALCIUM  8.4* 8.5* 8.1* 7.9* 8.0*  MG  --  1.9  --   --   --   PHOS  --  2.9  --   --   --    Liver Function Tests: Recent Labs  Lab 03/30/24 0708  AST 29  ALT 24  ALKPHOS 76  BILITOT 1.0  PROT 7.4  ALBUMIN 3.3*   CBG: Recent Labs  Lab 03/30/24 1930 03/30/24 2130  GLUCAP 143* 157*    Discharge time spent: 35 minutes.  Signed: Concepcion Riser, MD [facility] 04/03/2024

## 2024-04-03 NOTE — TOC Progression Note (Addendum)
 Transition of Care [facility]) - Progression Note    Patient Details  Name: [name] MRN: [id number] Date of Birth: 09-Mar-1936  [facility] CM/SW Contact  Bridget Cordella Simmonds, LCSW Phone Number: 04/03/2024, 8:25 AM  Clinical Narrative:   SNF auth request remains pending in [place]. New PT note uploaded.  1030: SNF auth approved: [id number], 5 days: 9/25-9/29.  CSW confirmed with [facility] that they can receive pt today.  MD informed.   Expected Discharge Plan: Skilled Nursing Facility Barriers to Discharge: Continued Medical Work up, SNF Pending bed offer               Expected Discharge Plan and Services In-house Referral: Clinical Social Work   Post Acute Care Choice: Skilled Nursing Facility Living arrangements for the past 2 months: Single Family Home                                       Social Drivers of Health (SDOH) Interventions SDOH Screenings   Food Insecurity: No Food Insecurity (03/30/2024)  Housing: Patient Unable To Answer (03/30/2024)  Transportation Needs: No Transportation Needs (03/30/2024)  Utilities: Patient Unable To Answer (03/30/2024)  Alcohol Screen: Low Risk  (01/03/2023)  Depression (PHQ2-9): Low Risk  (03/17/2024)  Financial Resource Strain: Low Risk  (01/03/2023)  Physical Activity: Insufficiently Active (01/03/2023)  Social Connections: Patient Unable To Answer (03/30/2024)  Stress: No Stress Concern Present (01/03/2023)  Tobacco Use: Low Risk  (03/30/2024)    Readmission Risk Interventions     No data to display

## 2024-04-03 NOTE — Progress Notes (Signed)
 Dressing changed on left great toe per MD order.

## 2024-04-03 NOTE — TOC Transition Note (Signed)
 Transition of Care [facility]) - Discharge Note   Patient Details  Name: [name] MRN: [id number] Date of Birth: 1936/02/23  Transition of Care [facility]) CM/SW Contact:  Bridget Cordella Simmonds, LCSW Phone Number: 04/03/2024, 1:15 PM   Clinical Narrative:   Pt discharging to [facility]. RN report to [phone]   PTAR called [phone].   Final next level of care: Skilled Nursing Facility Barriers to Discharge: Barriers Resolved   Patient Goals and CMS Choice   CMS Medicare.gov Compare Post Acute Care list provided to:: Patient Represenative (must comment) (wife) Choice offered to / list presented to : Spouse      Discharge Placement              Patient chooses bed at: [facility] Patient to be transferred to facility by: ptar Name of family member notified: wife Rock by phone,  also daughter Molly in room Patient and family notified of of transfer: 04/03/24  Discharge Plan and Services Additional resources added to the After Visit Summary for   In-house Referral: Clinical Social Work   Post Acute Care Choice: Skilled Nursing Facility                               Social Drivers of Health (SDOH) Interventions SDOH Screenings   Food Insecurity: No Food Insecurity (03/30/2024)  Housing: Patient Unable To Answer (03/30/2024)  Transportation Needs: No Transportation Needs (03/30/2024)  Utilities: Patient Unable To Answer (03/30/2024)  Alcohol Screen: Low Risk  (01/03/2023)  Depression (PHQ2-9): Low Risk  (03/17/2024)  Financial Resource Strain: Low Risk  (01/03/2023)  Physical Activity: Insufficiently Active (01/03/2023)  Social Connections: Patient Unable To Answer (03/30/2024)  Stress: No Stress Concern Present (01/03/2023)  Tobacco Use: Low Risk  (03/30/2024)     Readmission Risk Interventions     No data to display

## 2024-04-06 ENCOUNTER — Encounter: Payer: Self-pay | Admitting: Adult Health

## 2024-04-06 ENCOUNTER — Non-Acute Institutional Stay (SKILLED_NURSING_FACILITY): Payer: Self-pay | Admitting: Adult Health

## 2024-04-06 DIAGNOSIS — I6930 Unspecified sequelae of cerebral infarction: Secondary | ICD-10-CM

## 2024-04-06 DIAGNOSIS — E782 Mixed hyperlipidemia: Secondary | ICD-10-CM | POA: Diagnosis not present

## 2024-04-06 DIAGNOSIS — S72142G Displaced intertrochanteric fracture of left femur, subsequent encounter for closed fracture with delayed healing: Secondary | ICD-10-CM | POA: Diagnosis not present

## 2024-04-06 DIAGNOSIS — F0390 Unspecified dementia without behavioral disturbance: Secondary | ICD-10-CM | POA: Diagnosis not present

## 2024-04-06 DIAGNOSIS — I10 Essential (primary) hypertension: Secondary | ICD-10-CM | POA: Diagnosis not present

## 2024-04-06 DIAGNOSIS — E440 Moderate protein-calorie malnutrition: Secondary | ICD-10-CM | POA: Diagnosis not present

## 2024-04-06 DIAGNOSIS — I700 Atherosclerosis of aorta: Secondary | ICD-10-CM | POA: Diagnosis not present

## 2024-04-06 DIAGNOSIS — D62 Acute posthemorrhagic anemia: Secondary | ICD-10-CM

## 2024-04-06 DIAGNOSIS — K5909 Other constipation: Secondary | ICD-10-CM | POA: Diagnosis not present

## 2024-04-06 NOTE — Progress Notes (Unsigned)
 Location:  [facility] Room Number: 125 Place of Service:  SNF (31)   CODE STATUS: full code   Allergies  Allergen Reactions   Ace Inhibitors Swelling    Chief Complaint  Patient presents with   Hospitalization Follow-up    HPI:  He is a 88 year old man who has been hospitalized from 03-29-24 through 04-03-24. His past medical history includes: left great toe ulceration; hyperlipidemia; hypertension; dementia; history of cva. He presented to the ED after a witnessed fall and landed on his left side. He suffered a left femur fracture. Is status post ORIF and intramedullary rod on 03-30-24. He is here for short term rehab with his goal to return back home. Family is at bed side. He will continue to be followed for his chronic illnesses including: Moderate protein calorie malnutrition: Mixed hyperlipidemia:   Late effect of cerebrovascular accident:    Past Medical History:  Diagnosis Date   Anxiety    Asthma    Choledocholithiasis 02/24/2013   COPD (chronic obstructive pulmonary disease) (HCC)    CVA (cerebral vascular accident) (HCC) 2013   loss of memory   Dementia (HCC)    Diabetes mellitus    Hx-TIA (transient ischemic attack)    Hyperlipidemia    Hypertension     Past Surgical History:  Procedure Laterality Date   CATARACT EXTRACTION W/PHACO Left 04/30/2016   Procedure: CATARACT EXTRACTION PHACO AND INTRAOCULAR LENS PLACEMENT LEFT EYE CDE=9.83;  Surgeon: Cherene Mania, MD;  Location: [facility];  Service: Ophthalmology;  Laterality: Left;  left   CHOLECYSTECTOMY N/A 02/23/2013   Procedure: LAPAROSCOPIC CHOLECYSTECTOMY;  Surgeon: Oneil DELENA Budge, MD;  Location: [facility];  Service: General;  Laterality: N/A;   ERCP N/A 02/24/2013   Procedure: ENDOSCOPIC RETROGRADE CHOLANGIOPANCREATOGRAPHY;  Surgeon: Lamar CHRISTELLA Hollingshead, MD;  Location: [facility];  Service: Endoscopy;  Laterality: N/A;   INTRAMEDULLARY (IM) NAIL INTERTROCHANTERIC Left 03/30/2024   Procedure: FIXATION,  FRACTURE, INTERTROCHANTERIC, WITH INTRAMEDULLARY ROD;  Surgeon: Georgina Ozell DELENA, MD;  Location: [facility];  Service: Orthopedics;  Laterality: Left;   ROTATOR CUFF REPAIR Right     Social History   Socioeconomic History   Marital status: Married    Spouse name: Rock   Number of children: 6   Years of education: 11th   Highest education level: 11th grade  Occupational History   Occupation: Retired    Comment: security - [employer]   Tobacco Use   Smoking status: Never   Smokeless tobacco: Never  Vaping Use   Vaping status: Never Used  Substance and Sexual Activity   Alcohol use: No   Drug use: No   Sexual activity: Not Currently  Other Topics Concern   Not on file  Social History Narrative   Patient lives at home with wife, rock - Grandson lives with them right  now and son lives across the street.   Caffeine Use: 1 cup of coffee and 2 can pepsi   Since his stroke, memory has declined and he has poor fine motor skills   Social Drivers of Corporate investment banker Strain: Low Risk  (01/03/2023)   Overall Financial Resource Strain (CARDIA)    Difficulty of Paying Living Expenses: Not hard at all  Food Insecurity: No Food Insecurity (03/30/2024)   Hunger Vital Sign    Worried About Running Out of Food in the Last Year: Never true    Ran Out of Food in the Last Year: Never true  Transportation Needs: No Transportation Needs (03/30/2024)   PRAPARE - Administrator, Civil Service (Medical): No    Lack of Transportation (Non-Medical): No  Physical Activity: Insufficiently Active (01/03/2023)   Exercise Vital Sign    Days of Exercise per Week: 3 days    Minutes of Exercise per Session: 30 min  Stress: No Stress Concern Present (01/03/2023)   [employer] of Occupational Health - Occupational Stress Questionnaire    Feeling of Stress : Not at all  Social Connections: Patient Unable To Answer (03/30/2024)   Social Connection and Isolation Panel    Frequency  of Communication with Friends and Family: Patient unable to answer    Frequency of Social Gatherings with Friends and Family: Patient unable to answer    Attends Religious Services: Patient unable to answer    Active Member of Clubs or Organizations: Patient unable to answer    Attends Banker Meetings: Patient unable to answer    Marital Status: Patient unable to answer  Intimate Partner Violence: Patient Unable To Answer (03/30/2024)   Humiliation, Afraid, Rape, and Kick questionnaire    Fear of Current or Ex-Partner: Patient unable to answer    Emotionally Abused: Patient unable to answer    Physically Abused: Patient unable to answer    Sexually Abused: Patient unable to answer   Family History  Problem Relation Age of Onset   Hypertension Mother    Hypertension Father    Diabetes Sister    Diabetes Sister    Heart disease Brother    Emphysema Brother    Colon cancer Neg Hx       VITAL SIGNS BP 106/69   Pulse 60   Temp 98.6 F (37 C)   Resp 19   Ht 5' 9.5 (1.765 m)   Wt 139 lb 12.8 oz (63.4 kg)   SpO2 100%   BMI 20.35 kg/m   Outpatient Encounter Medications as of 04/06/2024  Medication Sig   acetaminophen  (TYLENOL ) 500 MG tablet Take 2 tablets (1,000 mg total) by mouth every 8 (eight) hours for 14 days.   albuterol  (VENTOLIN  HFA) 108 (90 Base) MCG/ACT inhaler Inhale 2 puffs into the lungs every 4 (four) hours as needed for wheezing or shortness of breath.   aspirin  81 MG chewable tablet Chew 1 tablet (81 mg total) by mouth 2 (two) times daily.   atorvastatin  (LIPITOR) 40 MG tablet TAKE 1 TABLET EVERY DAY   losartan  (COZAAR ) 50 MG tablet TAKE 1 TABLET EVERY DAY   megestrol  (MEGACE ) 400 MG/10ML suspension Take 10 mLs (400 mg total) by mouth 2 (two) times daily. For appetite stimulation   memantine  (NAMENDA ) 5 MG tablet Take 1 tablet (5 mg total) by mouth 2 (two) times daily.   oxyCODONE  (OXY IR/ROXICODONE ) 5 MG immediate release tablet Take 0.5-1 tablets  (2.5-5 mg total) by mouth every 4 (four) hours as needed for up to 7 days for severe pain (pain score 7-10) or moderate pain (pain score 4-6).   polyethylene glycol (MIRALAX ) 17 g packet Take 17 g by mouth daily for 14 days.   QUEtiapine  (SEROQUEL ) 25 MG tablet Take 1 tablet (25 mg total) by mouth at bedtime. For sleep and for nerves   rivastigmine  (EXELON ) 6 MG capsule Take 1 capsule (6 mg total) by mouth 2 (two) times daily. For memory   senna (SENOKOT) 8.6 MG TABS tablet Take 1 tablet (8.6 mg total) by mouth in the morning and at bedtime for 14 days.  triamcinolone  cream (KENALOG ) 0.1 % Apply 1 Application topically 3 (three) times daily. Avoid face and genitalia (Patient taking differently: Apply 1 Application topically 2 (two) times daily as needed (itching, skin irritation).)   No facility-administered encounter medications on file as of 04/06/2024.     SIGNIFICANT DIAGNOSTIC EXAMS  TODAY  03-17-24: hgb A1c 5.0; chol 98; ldl 27; trig 117; hdl 50 03-29-24: wbc 9.1; hgb 11.9; hct 35.3; mcv 93.6 plt 181;glucose 129; bun 23; creat 1.52; k+ 4.2; na++ 139; ca 8.4; gfr 44; protein 7.4 albumin 3.3 04-03-24: wbc 7.9; hgb 8.6; hct 25.4; mcv 92.0 plt 231 glucose 121; bun 32; creat 1.30; k+ 4.4; na++ 134; ca 80; gfr 54   Review of Systems  Unable to perform ROS: Dementia   Physical Exam Constitutional:      General: He is not in acute distress.    Appearance: He is well-developed. He is not diaphoretic.  Neck:     Thyroid : No thyromegaly.  Cardiovascular:     Rate and Rhythm: Normal rate and regular rhythm.     Heart sounds: Normal heart sounds.  Pulmonary:     Effort: Pulmonary effort is normal. No respiratory distress.     Breath sounds: Normal breath sounds.  Abdominal:     General: Bowel sounds are normal. There is no distension.     Palpations: Abdomen is soft.     Tenderness: There is no abdominal tenderness.  Musculoskeletal:        General: Normal range of motion.     Cervical  back: Neck supple.     Right lower leg: No edema.     Left lower leg: No edema.  Lymphadenopathy:     Cervical: No cervical adenopathy.  Skin:    General: Skin is warm and dry.     Comments: Incision line without indications of infection present   Neurological:     Mental Status: He is alert. Mental status is at baseline.  Psychiatric:        Mood and Affect: Mood normal.       ASSESSMENT/ PLAN:  TODAY  Closed displaced intertrochanteric fracture of left femur with delayed healing subsequent encounter: will continue therapy as directed; will follow up with orthopedics. Will continue tylenol  1 gm three times daily; has oxycodone  5 mg every 6 hours as needed through 04-10-24.   2. Moderate protein calorie malnutrition: will continue supplements as directed  3. Mixed hyperlipidemia: ldl 27; will continue lipitor 40 mg daily   4. Late effect of cerebrovascular accident: is on asa   5. Acute post operative anemia due to expected blood loss: hgb 8.6  6. Essential hypertension: b/p 106/69 will continue cozaar  50 mg daily   7. Chronic constipation: will continue miralax  daily senna twice daily   8. Aortic atherosclerosis (ct 01-23-18) is on asa and statin  9. Dementia without behavioral disturbance: will continue namenda  5 mg twice daily and exelon  6 mg twice daily    Barnie Seip NP [facility]   call [phone]

## 2024-04-08 ENCOUNTER — Non-Acute Institutional Stay (SKILLED_NURSING_FACILITY): Payer: Self-pay | Admitting: Internal Medicine

## 2024-04-08 ENCOUNTER — Encounter: Payer: Self-pay | Admitting: Internal Medicine

## 2024-04-08 DIAGNOSIS — S91102D Unspecified open wound of left great toe without damage to nail, subsequent encounter: Secondary | ICD-10-CM | POA: Diagnosis not present

## 2024-04-08 DIAGNOSIS — R9431 Abnormal electrocardiogram [ECG] [EKG]: Secondary | ICD-10-CM | POA: Diagnosis not present

## 2024-04-08 DIAGNOSIS — F0390 Unspecified dementia without behavioral disturbance: Secondary | ICD-10-CM

## 2024-04-08 DIAGNOSIS — S72142G Displaced intertrochanteric fracture of left femur, subsequent encounter for closed fracture with delayed healing: Secondary | ICD-10-CM

## 2024-04-08 DIAGNOSIS — E440 Moderate protein-calorie malnutrition: Secondary | ICD-10-CM

## 2024-04-08 DIAGNOSIS — E46 Unspecified protein-calorie malnutrition: Secondary | ICD-10-CM | POA: Insufficient documentation

## 2024-04-08 DIAGNOSIS — D62 Acute posthemorrhagic anemia: Secondary | ICD-10-CM | POA: Insufficient documentation

## 2024-04-08 NOTE — Assessment & Plan Note (Addendum)
 Delayed healing is anticipated as protein/caloric malnutrition suggested and he has hypocalcemia.  Vitamin D status is unknown. PT/OT and SNF as tolerated.

## 2024-04-08 NOTE — Assessment & Plan Note (Addendum)
 Findings discussed with wife.  Namenda  would not be indicated for vascular dementia unless it is of value as a mood stabilizer.  Consideration of as to discontinuation of Namenda  deferred to PCP. His severe dementia will compromise participation with PT/OT most likely.

## 2024-04-08 NOTE — Patient Instructions (Signed)
 See assessment and plan under each diagnosis in the problem list and acutely for this visit

## 2024-04-08 NOTE — Assessment & Plan Note (Signed)
 Avoid medications with potential of QT prolongation.

## 2024-04-08 NOTE — Assessment & Plan Note (Signed)
 No bleeding dyscrasias noted at the SNF; continue to monitor.

## 2024-04-08 NOTE — Progress Notes (Unsigned)
 NURSING HOME LOCATION:  [facility] ROOM NUMBER:  125  CODE STATUS:  Full Code  PCP:  Butler Der MD  This is a comprehensive admission note to this SNFperformed on this date less than 30 days from date of admission. Included are preadmission medical/surgical history; reconciled medication list; family history; social history and comprehensive review of systems.  Corrections and additions to the records were documented. Comprehensive physical exam was also performed. Additionally a clinical summary was entered for each active diagnosis pertinent to this admission in the Problem List to enhance continuity of care.  HPI: He was hospitalized 9/21 - 04/03/2024 presenting to the ED from home via EMS due to an unwitnessed fall.  He landed on his left side and had difficulty in arising.  In the ED imaging revealed displaced, mildly angulated left femoral intertrochanteric fracture.  Intramedullary rodding and ORIF was completed 9/22 by Dr. Ozell Ada; WBAT was ordered postop as tolerated. An open wound of the left great toe was noted.  Imaging revealed mild soft tissue swelling without evidence of osteomyelitis.  By history this had not been resolving over the prior 2 months; outpatient Podiatry follow-up was recommended.  He did receive IV cefepime  as an inpatient with wound care management.  Ciprofloxacin  was continued at discharge. Remeron  was discontinued because of risk of QTc prolongation. While hospitalized he developed mild hyponatremia with a final value of 134.  Glucoses ranged from 121 up to 149.  Last A1c on record was in 2017.  Mild prerenal azotemia was documented with peak BUN of 32.  Initial creatinine was 1.52 with peak of 1.59.  Final value was 1.30.  Nadir GFR was 42 with a final GFR of 54, indicating stage IIIa CKD.  Hypocalcemia was present with a value of 8.0.  At admission H/H was 11.9/35.3 with normochromic, normocytic indices.  Nadir H/H was 7.8/23.1; and  final value 8.6/25.4. PT/OT consulted and recommended SNF placement for rehab.  Past medical and surgical history: Includes essential hypertension; mixed hyperlipidemia;  diabetes with CKD stage IIIa; history of asthma; history of gallstones; history of stroke; COPD; and dementia. Most recent cns imaging in 2014 revealed atrophy, chronic microvascular changes and old anterior left frontal infarct. Surgeries and procedures include cholecystectomy; rotator cuff repair; and ERCP.  Family history: reviewed, non contributory due to advanced age.  Social history: Nondrinker; non-smoker.   Review of systems could not be completed because of severe dementia.  He had no comprehension of why he had been hospitalized or that he even had surgery.  His wife states that she was on the porch when she heard him fall and reentered the house and found him on the floor.  She believes he was asleep in the chair and woke up and tried to find her.  Physical exam:  Pertinent or positive findings in all.  He appears his age and chronically ill. He is very gaunt. Facies tend to be blank.  He has marked bilateral ptosis.  The lower lids are puffy.  There is slight ectropion of the right lower lid.  He has a Mudlogger and a Magazine features editor.  He is missing an upper incisor and has evidence of caries.  Teeth are coated.  His voice is weak and dysarthric.  Intermittently he would cry out as if in pain.  He he could not communicate why he was crying out.  He exhibits intermittent tongue thrust.  Breath sounds are decreased and heart sounds are distant.  Clinically  the rhythm is irregular.  Pedal pulses are not palpable.  Limb atrophy and marked interosseous wasting are noted.  He has isolated osteoarthritic changes of the DIP joints.  Clubbing of the nailbeds is suggested. [facility] NP & DON assessed the toe lesion. Clincally they felt this represented an ingrown toenail with possible minor cellulitis.  General appearance: no acute distress,  increased work of breathing is present.   Lymphatic: No lymphadenopathy about the head, neck, axilla. Eyes: No conjunctival inflammation or lid edema is present. There is no scleral icterus. Ears:  External ear exam shows no significant lesions or deformities.   Nose:  External nasal examination shows no deformity or inflammation. Nasal mucosa are pink and moist without lesions, exudates Neck:  No thyromegaly, masses, tenderness noted.    Heart:  No gallop, murmur, click, rub.  Lungs:  without wheezes, rhonchi, rales, rubs. Abdomen: Bowel sounds are normal.  Abdomen is soft and nontender with no organomegaly, hernias, masses. GU: Deferred  Extremities:  No cyanosis, clubbing, edema. Neurologic exam:  Balance, Rhomberg, finger to nose testing could not be completed due to clinical state Skin: Warm & dry w/o tenting. No significant rash.  See clinical summary under each active problem in the Problem List with associated updated therapeutic plan :  Intertrochanteric fracture of left femur (HCC) Delayed healing is anticipated as protein/caloric malnutrition suggested and he has hypocalcemia.  Vitamin D status is unknown. PT/OT and SNF as tolerated.  Open wound of left great toe Wound Care monitor at SNF.  Acute postoperative anemia due to expected blood loss No bleeding dyscrasias noted at the SNF; continue to monitor.  Unspecified protein-calorie malnutrition Current total protein is normal at 7.4; albumin was 3.3.  He exhibits marked limb atrophy and interosseous wasting.  Nutritionist to assess at SNF.  Dementia without behavioral disturbance (HCC) Findings discussed with wife.  Namenda  would not be indicated for vascular dementia unless it is of value as a mood stabilizer.  Consideration of as to discontinuation of Namenda  deferred to PCP. His severe dementia will compromise participation with PT/OT most likely.  Abnormal electrocardiogram (ECG) (EKG) Avoid medications with  potential of QT prolongation.

## 2024-04-08 NOTE — Assessment & Plan Note (Signed)
 Current total protein is normal at 7.4; albumin was 3.3.  He exhibits marked limb atrophy and interosseous wasting.  Nutritionist to assess at SNF.

## 2024-04-08 NOTE — Assessment & Plan Note (Addendum)
 Wound Care monitor at SNF.

## 2024-04-09 ENCOUNTER — Other Ambulatory Visit (HOSPITAL_COMMUNITY)
Admission: RE | Admit: 2024-04-09 | Discharge: 2024-04-09 | Disposition: A | Discharge status: Home / Self Care | Source: Skilled Nursing Facility | Attending: Adult Health | Admitting: Adult Health

## 2024-04-09 DIAGNOSIS — I700 Atherosclerosis of aorta: Secondary | ICD-10-CM | POA: Insufficient documentation

## 2024-04-09 DIAGNOSIS — K5909 Other constipation: Secondary | ICD-10-CM | POA: Insufficient documentation

## 2024-04-09 DIAGNOSIS — L089 Local infection of the skin and subcutaneous tissue, unspecified: Secondary | ICD-10-CM | POA: Insufficient documentation

## 2024-04-13 ENCOUNTER — Other Ambulatory Visit (HOSPITAL_COMMUNITY)
Admission: RE | Admit: 2024-04-13 | Discharge: 2024-04-13 | Disposition: A | Discharge status: Home / Self Care | Source: Skilled Nursing Facility | Attending: Adult Health | Admitting: Adult Health

## 2024-04-13 DIAGNOSIS — I2489 Other forms of acute ischemic heart disease: Secondary | ICD-10-CM | POA: Diagnosis present

## 2024-04-13 DIAGNOSIS — J90 Pleural effusion, not elsewhere classified: Secondary | ICD-10-CM | POA: Diagnosis present

## 2024-04-13 DIAGNOSIS — L600 Ingrowing nail: Secondary | ICD-10-CM | POA: Diagnosis not present

## 2024-04-13 DIAGNOSIS — E875 Hyperkalemia: Secondary | ICD-10-CM | POA: Diagnosis present

## 2024-04-13 DIAGNOSIS — I700 Atherosclerosis of aorta: Secondary | ICD-10-CM | POA: Diagnosis present

## 2024-04-13 DIAGNOSIS — E785 Hyperlipidemia, unspecified: Secondary | ICD-10-CM | POA: Diagnosis present

## 2024-04-13 DIAGNOSIS — L89322 Pressure ulcer of left buttock, stage 2: Secondary | ICD-10-CM | POA: Diagnosis present

## 2024-04-13 DIAGNOSIS — Z789 Other specified health status: Secondary | ICD-10-CM | POA: Diagnosis not present

## 2024-04-13 DIAGNOSIS — M79675 Pain in left toe(s): Secondary | ICD-10-CM | POA: Diagnosis not present

## 2024-04-13 DIAGNOSIS — E871 Hypo-osmolality and hyponatremia: Secondary | ICD-10-CM | POA: Diagnosis present

## 2024-04-13 DIAGNOSIS — I959 Hypotension, unspecified: Secondary | ICD-10-CM | POA: Diagnosis not present

## 2024-04-13 DIAGNOSIS — N184 Chronic kidney disease, stage 4 (severe): Secondary | ICD-10-CM | POA: Diagnosis present

## 2024-04-13 DIAGNOSIS — E860 Dehydration: Secondary | ICD-10-CM | POA: Diagnosis present

## 2024-04-13 DIAGNOSIS — R404 Transient alteration of awareness: Secondary | ICD-10-CM | POA: Diagnosis not present

## 2024-04-13 DIAGNOSIS — S72142D Displaced intertrochanteric fracture of left femur, subsequent encounter for closed fracture with routine healing: Secondary | ICD-10-CM | POA: Insufficient documentation

## 2024-04-13 DIAGNOSIS — G8911 Acute pain due to trauma: Secondary | ICD-10-CM | POA: Diagnosis not present

## 2024-04-13 DIAGNOSIS — Z558 Other problems related to education and literacy: Secondary | ICD-10-CM | POA: Diagnosis not present

## 2024-04-13 DIAGNOSIS — S79929A Unspecified injury of unspecified thigh, initial encounter: Secondary | ICD-10-CM | POA: Diagnosis not present

## 2024-04-13 DIAGNOSIS — R7989 Other specified abnormal findings of blood chemistry: Secondary | ICD-10-CM | POA: Diagnosis not present

## 2024-04-13 DIAGNOSIS — Z7189 Other specified counseling: Secondary | ICD-10-CM | POA: Diagnosis not present

## 2024-04-13 DIAGNOSIS — R778 Other specified abnormalities of plasma proteins: Secondary | ICD-10-CM | POA: Diagnosis not present

## 2024-04-13 DIAGNOSIS — Z7982 Long term (current) use of aspirin: Secondary | ICD-10-CM | POA: Diagnosis not present

## 2024-04-13 DIAGNOSIS — J4489 Other specified chronic obstructive pulmonary disease: Secondary | ICD-10-CM | POA: Diagnosis present

## 2024-04-13 DIAGNOSIS — Z66 Do not resuscitate: Secondary | ICD-10-CM | POA: Diagnosis not present

## 2024-04-13 DIAGNOSIS — I2510 Atherosclerotic heart disease of native coronary artery without angina pectoris: Secondary | ICD-10-CM | POA: Diagnosis present

## 2024-04-13 DIAGNOSIS — L89151 Pressure ulcer of sacral region, stage 1: Secondary | ICD-10-CM | POA: Diagnosis present

## 2024-04-13 DIAGNOSIS — R531 Weakness: Secondary | ICD-10-CM | POA: Diagnosis not present

## 2024-04-13 DIAGNOSIS — F0390 Unspecified dementia without behavioral disturbance: Secondary | ICD-10-CM | POA: Diagnosis present

## 2024-04-13 DIAGNOSIS — Z515 Encounter for palliative care: Secondary | ICD-10-CM | POA: Diagnosis not present

## 2024-04-13 DIAGNOSIS — E870 Hyperosmolality and hypernatremia: Secondary | ICD-10-CM | POA: Diagnosis present

## 2024-04-13 DIAGNOSIS — G9341 Metabolic encephalopathy: Secondary | ICD-10-CM | POA: Diagnosis present

## 2024-04-13 DIAGNOSIS — R5383 Other fatigue: Secondary | ICD-10-CM | POA: Diagnosis not present

## 2024-04-13 DIAGNOSIS — R6520 Severe sepsis without septic shock: Secondary | ICD-10-CM | POA: Diagnosis not present

## 2024-04-13 DIAGNOSIS — E1122 Type 2 diabetes mellitus with diabetic chronic kidney disease: Secondary | ICD-10-CM | POA: Diagnosis present

## 2024-04-13 DIAGNOSIS — I739 Peripheral vascular disease, unspecified: Secondary | ICD-10-CM | POA: Diagnosis not present

## 2024-04-13 DIAGNOSIS — F03C0 Unspecified dementia, severe, without behavioral disturbance, psychotic disturbance, mood disturbance, and anxiety: Secondary | ICD-10-CM | POA: Diagnosis not present

## 2024-04-13 DIAGNOSIS — R4182 Altered mental status, unspecified: Secondary | ICD-10-CM | POA: Diagnosis not present

## 2024-04-13 DIAGNOSIS — A419 Sepsis, unspecified organism: Secondary | ICD-10-CM | POA: Diagnosis not present

## 2024-04-13 DIAGNOSIS — N179 Acute kidney failure, unspecified: Secondary | ICD-10-CM | POA: Diagnosis present

## 2024-04-13 DIAGNOSIS — E872 Acidosis, unspecified: Secondary | ICD-10-CM | POA: Diagnosis present

## 2024-04-13 DIAGNOSIS — I129 Hypertensive chronic kidney disease with stage 1 through stage 4 chronic kidney disease, or unspecified chronic kidney disease: Secondary | ICD-10-CM | POA: Diagnosis present

## 2024-04-13 DIAGNOSIS — L03032 Cellulitis of left toe: Secondary | ICD-10-CM | POA: Diagnosis present

## 2024-04-13 DIAGNOSIS — R000 Tachycardia, unspecified: Secondary | ICD-10-CM | POA: Diagnosis not present

## 2024-04-13 DIAGNOSIS — Z743 Need for continuous supervision: Secondary | ICD-10-CM | POA: Diagnosis not present

## 2024-04-13 LAB — HEMOGLOBIN AND HEMATOCRIT, BLOOD
HCT: 33.2 % — ABNORMAL LOW (ref 39.0–52.0)
Hemoglobin: 10.7 g/dL — ABNORMAL LOW (ref 13.0–17.0)

## 2024-04-13 LAB — AEROBIC/ANAEROBIC CULTURE W GRAM STAIN (SURGICAL/DEEP WOUND): Gram Stain: NONE SEEN

## 2024-04-14 ENCOUNTER — Ambulatory Visit: Admitting: Podiatry

## 2024-04-15 ENCOUNTER — Other Ambulatory Visit: Payer: Self-pay

## 2024-04-15 ENCOUNTER — Other Ambulatory Visit (HOSPITAL_COMMUNITY)
Admission: RE | Admit: 2024-04-15 | Discharge: 2024-04-15 | Disposition: A | Discharge status: Home / Self Care | Source: Skilled Nursing Facility | Attending: Adult Health | Admitting: Adult Health

## 2024-04-15 ENCOUNTER — Emergency Department (HOSPITAL_COMMUNITY)

## 2024-04-15 ENCOUNTER — Ambulatory Visit: Admitting: Orthopedic Surgery

## 2024-04-15 ENCOUNTER — Inpatient Hospital Stay (HOSPITAL_COMMUNITY)
Admission: EM | Admit: 2024-04-15 | Discharge: 2024-04-20 | DRG: 640 | Disposition: A | Discharge status: Home / Self Care | Source: Skilled Nursing Facility | Attending: Internal Medicine | Admitting: Internal Medicine

## 2024-04-15 ENCOUNTER — Encounter (HOSPITAL_COMMUNITY): Payer: Self-pay

## 2024-04-15 DIAGNOSIS — F0390 Unspecified dementia without behavioral disturbance: Secondary | ICD-10-CM | POA: Diagnosis present

## 2024-04-15 DIAGNOSIS — J4489 Other specified chronic obstructive pulmonary disease: Secondary | ICD-10-CM | POA: Diagnosis present

## 2024-04-15 DIAGNOSIS — L03032 Cellulitis of left toe: Secondary | ICD-10-CM | POA: Diagnosis present

## 2024-04-15 DIAGNOSIS — E785 Hyperlipidemia, unspecified: Secondary | ICD-10-CM | POA: Diagnosis present

## 2024-04-15 DIAGNOSIS — I700 Atherosclerosis of aorta: Secondary | ICD-10-CM | POA: Diagnosis present

## 2024-04-15 DIAGNOSIS — Z66 Do not resuscitate: Secondary | ICD-10-CM | POA: Diagnosis not present

## 2024-04-15 DIAGNOSIS — E875 Hyperkalemia: Secondary | ICD-10-CM | POA: Diagnosis present

## 2024-04-15 DIAGNOSIS — Z7982 Long term (current) use of aspirin: Secondary | ICD-10-CM | POA: Diagnosis not present

## 2024-04-15 DIAGNOSIS — R7989 Other specified abnormal findings of blood chemistry: Secondary | ICD-10-CM

## 2024-04-15 DIAGNOSIS — J90 Pleural effusion, not elsewhere classified: Secondary | ICD-10-CM | POA: Diagnosis present

## 2024-04-15 DIAGNOSIS — E871 Hypo-osmolality and hyponatremia: Secondary | ICD-10-CM | POA: Diagnosis present

## 2024-04-15 DIAGNOSIS — R4182 Altered mental status, unspecified: Secondary | ICD-10-CM | POA: Diagnosis not present

## 2024-04-15 DIAGNOSIS — N179 Acute kidney failure, unspecified: Secondary | ICD-10-CM | POA: Diagnosis present

## 2024-04-15 DIAGNOSIS — Z825 Family history of asthma and other chronic lower respiratory diseases: Secondary | ICD-10-CM

## 2024-04-15 DIAGNOSIS — S72002D Fracture of unspecified part of neck of left femur, subsequent encounter for closed fracture with routine healing: Secondary | ICD-10-CM

## 2024-04-15 DIAGNOSIS — Z515 Encounter for palliative care: Secondary | ICD-10-CM | POA: Diagnosis not present

## 2024-04-15 DIAGNOSIS — E872 Acidosis, unspecified: Secondary | ICD-10-CM | POA: Diagnosis present

## 2024-04-15 DIAGNOSIS — Z79899 Other long term (current) drug therapy: Secondary | ICD-10-CM

## 2024-04-15 DIAGNOSIS — I2510 Atherosclerotic heart disease of native coronary artery without angina pectoris: Secondary | ICD-10-CM | POA: Diagnosis present

## 2024-04-15 DIAGNOSIS — Z7189 Other specified counseling: Secondary | ICD-10-CM | POA: Diagnosis not present

## 2024-04-15 DIAGNOSIS — I129 Hypertensive chronic kidney disease with stage 1 through stage 4 chronic kidney disease, or unspecified chronic kidney disease: Secondary | ICD-10-CM | POA: Diagnosis present

## 2024-04-15 DIAGNOSIS — L89151 Pressure ulcer of sacral region, stage 1: Secondary | ICD-10-CM | POA: Diagnosis present

## 2024-04-15 DIAGNOSIS — G9341 Metabolic encephalopathy: Secondary | ICD-10-CM | POA: Diagnosis present

## 2024-04-15 DIAGNOSIS — N184 Chronic kidney disease, stage 4 (severe): Secondary | ICD-10-CM | POA: Diagnosis present

## 2024-04-15 DIAGNOSIS — E1122 Type 2 diabetes mellitus with diabetic chronic kidney disease: Secondary | ICD-10-CM | POA: Diagnosis present

## 2024-04-15 DIAGNOSIS — Z8673 Personal history of transient ischemic attack (TIA), and cerebral infarction without residual deficits: Secondary | ICD-10-CM

## 2024-04-15 DIAGNOSIS — L8990 Pressure ulcer of unspecified site, unspecified stage: Secondary | ICD-10-CM | POA: Insufficient documentation

## 2024-04-15 DIAGNOSIS — Z558 Other problems related to education and literacy: Secondary | ICD-10-CM | POA: Diagnosis not present

## 2024-04-15 DIAGNOSIS — L89322 Pressure ulcer of left buttock, stage 2: Secondary | ICD-10-CM | POA: Diagnosis present

## 2024-04-15 DIAGNOSIS — S72142D Displaced intertrochanteric fracture of left femur, subsequent encounter for closed fracture with routine healing: Secondary | ICD-10-CM | POA: Insufficient documentation

## 2024-04-15 DIAGNOSIS — A419 Sepsis, unspecified organism: Principal | ICD-10-CM

## 2024-04-15 DIAGNOSIS — I2489 Other forms of acute ischemic heart disease: Secondary | ICD-10-CM | POA: Diagnosis present

## 2024-04-15 DIAGNOSIS — I959 Hypotension, unspecified: Secondary | ICD-10-CM | POA: Diagnosis present

## 2024-04-15 DIAGNOSIS — E870 Hyperosmolality and hypernatremia: Secondary | ICD-10-CM | POA: Diagnosis not present

## 2024-04-15 DIAGNOSIS — Z789 Other specified health status: Secondary | ICD-10-CM | POA: Diagnosis not present

## 2024-04-15 DIAGNOSIS — R0902 Hypoxemia: Secondary | ICD-10-CM | POA: Diagnosis present

## 2024-04-15 DIAGNOSIS — Z8249 Family history of ischemic heart disease and other diseases of the circulatory system: Secondary | ICD-10-CM

## 2024-04-15 DIAGNOSIS — L299 Pruritus, unspecified: Secondary | ICD-10-CM | POA: Diagnosis not present

## 2024-04-15 DIAGNOSIS — E860 Dehydration: Secondary | ICD-10-CM | POA: Diagnosis present

## 2024-04-15 DIAGNOSIS — Z833 Family history of diabetes mellitus: Secondary | ICD-10-CM

## 2024-04-15 DIAGNOSIS — F03C0 Unspecified dementia, severe, without behavioral disturbance, psychotic disturbance, mood disturbance, and anxiety: Secondary | ICD-10-CM | POA: Diagnosis not present

## 2024-04-15 DIAGNOSIS — L600 Ingrowing nail: Secondary | ICD-10-CM | POA: Diagnosis present

## 2024-04-15 LAB — CBC WITH DIFFERENTIAL/PLATELET
Abs Immature Granulocytes: 0.07 K/uL (ref 0.00–0.07)
Abs Immature Granulocytes: 0.09 K/uL — ABNORMAL HIGH (ref 0.00–0.07)
Basophils Absolute: 0.1 K/uL (ref 0.0–0.1)
Basophils Absolute: 0.1 K/uL (ref 0.0–0.1)
Basophils Relative: 1 %
Basophils Relative: 1 %
Eosinophils Absolute: 0.2 K/uL (ref 0.0–0.5)
Eosinophils Absolute: 0.2 K/uL (ref 0.0–0.5)
Eosinophils Relative: 1 %
Eosinophils Relative: 1 %
HCT: 33.4 % — ABNORMAL LOW (ref 39.0–52.0)
HCT: 33 % — ABNORMAL LOW (ref 39.0–52.0)
Hemoglobin: 10.7 g/dL — ABNORMAL LOW (ref 13.0–17.0)
Hemoglobin: 10.2 g/dL — ABNORMAL LOW (ref 13.0–17.0)
Immature Granulocytes: 0 %
Immature Granulocytes: 1 %
Lymphocytes Relative: 13 %
Lymphocytes Relative: 12 %
Lymphs Abs: 2.2 K/uL (ref 0.7–4.0)
Lymphs Abs: 2.2 K/uL (ref 0.7–4.0)
MCH: 32 pg (ref 26.0–34.0)
MCH: 31.3 pg (ref 26.0–34.0)
MCHC: 32 g/dL (ref 30.0–36.0)
MCHC: 30.9 g/dL (ref 30.0–36.0)
MCV: 100 fL (ref 80.0–100.0)
MCV: 101.2 fL — ABNORMAL HIGH (ref 80.0–100.0)
Monocytes Absolute: 1 K/uL (ref 0.1–1.0)
Monocytes Absolute: 0.9 K/uL (ref 0.1–1.0)
Monocytes Relative: 6 %
Monocytes Relative: 5 %
Neutro Abs: 14 K/uL — ABNORMAL HIGH (ref 1.7–7.7)
Neutro Abs: 14.2 K/uL — ABNORMAL HIGH (ref 1.7–7.7)
Neutrophils Relative %: 79 %
Neutrophils Relative %: 80 %
Platelets: 467 K/uL — ABNORMAL HIGH (ref 150–400)
Platelets: 480 K/uL — ABNORMAL HIGH (ref 150–400)
RBC: 3.34 MIL/uL — ABNORMAL LOW (ref 4.22–5.81)
RBC: 3.26 MIL/uL — ABNORMAL LOW (ref 4.22–5.81)
RDW: 13.6 % (ref 11.5–15.5)
RDW: 13.5 % (ref 11.5–15.5)
WBC: 17.5 K/uL — ABNORMAL HIGH (ref 4.0–10.5)
WBC: 17.7 K/uL — ABNORMAL HIGH (ref 4.0–10.5)
nRBC: 0 % (ref 0.0–0.2)
nRBC: 0 % (ref 0.0–0.2)

## 2024-04-15 LAB — COMPREHENSIVE METABOLIC PANEL WITH GFR
ALT: 35 U/L (ref 0–44)
ALT: 37 U/L (ref 0–44)
AST: 19 U/L (ref 15–41)
AST: 22 U/L (ref 15–41)
Albumin: 4 g/dL (ref 3.5–5.0)
Albumin: 4.1 g/dL (ref 3.5–5.0)
Alkaline Phosphatase: 131 U/L — ABNORMAL HIGH (ref 38–126)
Alkaline Phosphatase: 135 U/L — ABNORMAL HIGH (ref 38–126)
Anion gap: 17 — ABNORMAL HIGH (ref 5–15)
Anion gap: 23 — ABNORMAL HIGH (ref 5–15)
BUN: 103 mg/dL — ABNORMAL HIGH (ref 8–23)
BUN: 121 mg/dL — ABNORMAL HIGH (ref 8–23)
CO2: 17 mmol/L — ABNORMAL LOW (ref 22–32)
CO2: 16 mmol/L — ABNORMAL LOW (ref 22–32)
Calcium: 9.8 mg/dL (ref 8.9–10.3)
Calcium: 9.9 mg/dL (ref 8.9–10.3)
Chloride: 119 mmol/L — ABNORMAL HIGH (ref 98–111)
Chloride: 120 mmol/L — ABNORMAL HIGH (ref 98–111)
Creatinine, Ser: 2.82 mg/dL — ABNORMAL HIGH (ref 0.61–1.24)
Creatinine, Ser: 2.87 mg/dL — ABNORMAL HIGH (ref 0.61–1.24)
GFR, Estimated: 21 mL/min — ABNORMAL LOW (ref >60)
GFR, Estimated: 21 mL/min — ABNORMAL LOW (ref >60)
Glucose, Bld: 174 mg/dL — ABNORMAL HIGH (ref 70–99)
Glucose, Bld: 215 mg/dL — ABNORMAL HIGH (ref 70–99)
Potassium: 5.5 mmol/L — ABNORMAL HIGH (ref 3.5–5.1)
Potassium: 5.8 mmol/L — ABNORMAL HIGH (ref 3.5–5.1)
Sodium: 153 mmol/L — ABNORMAL HIGH (ref 135–145)
Sodium: 158 mmol/L — ABNORMAL HIGH (ref 135–145)
Total Bilirubin: 0.6 mg/dL (ref 0.0–1.2)
Total Bilirubin: 0.7 mg/dL (ref 0.0–1.2)
Total Protein: 8.7 g/dL — ABNORMAL HIGH (ref 6.5–8.1)
Total Protein: 8.8 g/dL — ABNORMAL HIGH (ref 6.5–8.1)

## 2024-04-15 LAB — BASIC METABOLIC PANEL WITH GFR
Anion gap: 13 (ref 5–15)
BUN: 103 mg/dL — ABNORMAL HIGH (ref 8–23)
CO2: 17 mmol/L — ABNORMAL LOW (ref 22–32)
Calcium: 9.1 mg/dL (ref 8.9–10.3)
Chloride: 123 mmol/L — ABNORMAL HIGH (ref 98–111)
Creatinine, Ser: 2.29 mg/dL — ABNORMAL HIGH (ref 0.61–1.24)
GFR, Estimated: 27 mL/min — ABNORMAL LOW (ref >60)
Glucose, Bld: 140 mg/dL — ABNORMAL HIGH (ref 70–99)
Potassium: 4.8 mmol/L (ref 3.5–5.1)
Sodium: 152 mmol/L — ABNORMAL HIGH (ref 135–145)

## 2024-04-15 LAB — URINALYSIS, ROUTINE W REFLEX MICROSCOPIC
Bilirubin Urine: NEGATIVE
Glucose, UA: NEGATIVE mg/dL
Hgb urine dipstick: NEGATIVE
Ketones, ur: NEGATIVE mg/dL
Leukocytes,Ua: NEGATIVE
Nitrite: NEGATIVE
Protein, ur: NEGATIVE mg/dL
Specific Gravity, Urine: 1.021 (ref 1.005–1.030)
pH: 5 (ref 5.0–8.0)

## 2024-04-15 LAB — TSH: TSH: 1.13 u[IU]/mL (ref 0.350–4.500)

## 2024-04-15 LAB — PROTIME-INR
INR: 1.2 (ref 0.8–1.2)
Prothrombin Time: 16.1 s — ABNORMAL HIGH (ref 11.4–15.2)

## 2024-04-15 LAB — RESP PANEL BY RT-PCR (RSV, FLU A&B, COVID)  RVPGX2
Influenza A by PCR: NEGATIVE
Influenza B by PCR: NEGATIVE
Resp Syncytial Virus by PCR: NEGATIVE
SARS Coronavirus 2 by RT PCR: NEGATIVE

## 2024-04-15 LAB — GLUCOSE, CAPILLARY
Glucose-Capillary: 146 mg/dL — ABNORMAL HIGH (ref 70–99)
Glucose-Capillary: 76 mg/dL (ref 70–99)
Glucose-Capillary: 84 mg/dL (ref 70–99)

## 2024-04-15 LAB — BLOOD GAS, VENOUS
Acid-base deficit: 6.4 mmol/L — ABNORMAL HIGH (ref 0.0–2.0)
Bicarbonate: 18.2 mmol/L — ABNORMAL LOW (ref 20.0–28.0)
Drawn by: 66297
O2 Saturation: 27.8 %
Patient temperature: 36.5
pCO2, Ven: 32 mmHg — ABNORMAL LOW (ref 44–60)
pH, Ven: 7.36 (ref 7.25–7.43)
pO2, Ven: <31 mmHg — CL (ref 32–45)

## 2024-04-15 LAB — PHOSPHORUS: Phosphorus: 5.1 mg/dL — ABNORMAL HIGH (ref 2.5–4.6)

## 2024-04-15 LAB — VITAMIN B12: Vitamin B-12: 490 pg/mL (ref 180–914)

## 2024-04-15 LAB — SEDIMENTATION RATE: Sed Rate: >140 mm/h — ABNORMAL HIGH (ref 0–20)

## 2024-04-15 LAB — MRSA NEXT GEN BY PCR, NASAL: MRSA by PCR Next Gen: NOT DETECTED

## 2024-04-15 LAB — PROCALCITONIN: Procalcitonin: 0.44 ng/mL

## 2024-04-15 LAB — LACTIC ACID, PLASMA
Lactic Acid, Venous: 5.1 mmol/L (ref 0.5–1.9)
Lactic Acid, Venous: 3.7 mmol/L (ref 0.5–1.9)

## 2024-04-15 LAB — TROPONIN T, HIGH SENSITIVITY
Troponin T High Sensitivity: 154 ng/L (ref 0–19)
Troponin T High Sensitivity: 132 ng/L (ref 0–19)

## 2024-04-15 LAB — MAGNESIUM: Magnesium: 3.1 mg/dL — ABNORMAL HIGH (ref 1.7–2.4)

## 2024-04-15 LAB — BETA-HYDROXYBUTYRIC ACID: Beta-Hydroxybutyric Acid: 0.12 mmol/L (ref 0.05–0.27)

## 2024-04-15 LAB — FOLATE: Folate: 5.1 ng/mL — ABNORMAL LOW (ref >5.9)

## 2024-04-15 MED ORDER — ONDANSETRON HCL 4 MG PO TABS: 4.0000 mg | ORAL_TABLET | Freq: Four times a day (QID) | ORAL | Status: DC | PRN

## 2024-04-15 MED ORDER — MORPHINE SULFATE (PF) 4 MG/ML IV SOLN
2.0000 mg | Freq: Once | INTRAVENOUS | Status: DC
  Filled 2024-04-15: qty 1

## 2024-04-15 MED ORDER — NYSTATIN 100000 UNIT/ML MT SUSP
5.0000 mL | Freq: Four times a day (QID) | OROMUCOSAL | Status: DC
  Administered 2024-04-15 – 2024-04-20 (×21): 500000 [IU] via ORAL
  Filled 2024-04-15 (×22): qty 5

## 2024-04-15 MED ORDER — ONDANSETRON HCL 4 MG/2ML IJ SOLN
4.0000 mg | Freq: Once | INTRAMUSCULAR | Status: AC
  Administered 2024-04-15: 4 mg via INTRAVENOUS
  Filled 2024-04-15: qty 2

## 2024-04-15 MED ORDER — SODIUM CHLORIDE 0.9 % IV SOLN
1.0000 g | Freq: Once | INTRAVENOUS | Status: AC
  Administered 2024-04-15: 1 g via INTRAVENOUS
  Filled 2024-04-15: qty 10

## 2024-04-15 MED ORDER — SODIUM CHLORIDE 0.9 % IV SOLN
500.0000 mg | INTRAVENOUS | Status: DC
  Administered 2024-04-15: 500 mg via INTRAVENOUS
  Filled 2024-04-15: qty 5

## 2024-04-15 MED ORDER — ACETAMINOPHEN 325 MG PO TABS
650.0000 mg | ORAL_TABLET | Freq: Four times a day (QID) | ORAL | Status: DC | PRN
  Administered 2024-04-16 – 2024-04-20 (×8): 650 mg via ORAL
  Filled 2024-04-15 (×9): qty 2

## 2024-04-15 MED ORDER — ATORVASTATIN CALCIUM 40 MG PO TABS
40.0000 mg | ORAL_TABLET | Freq: Every day | ORAL | Status: DC
  Administered 2024-04-15 – 2024-04-20 (×6): 40 mg via ORAL
  Filled 2024-04-15 (×6): qty 1

## 2024-04-15 MED ORDER — CHLORHEXIDINE GLUCONATE CLOTH 2 % EX PADS
6.0000 | MEDICATED_PAD | Freq: Every day | CUTANEOUS | Status: DC
  Administered 2024-04-16 – 2024-04-17 (×2): 6 via TOPICAL

## 2024-04-15 MED ORDER — LACTATED RINGERS IV BOLUS
1000.0000 mL | Freq: Once | INTRAVENOUS | Status: AC
  Administered 2024-04-15: 1000 mL via INTRAVENOUS

## 2024-04-15 MED ORDER — HEPARIN SODIUM (PORCINE) 5000 UNIT/ML IJ SOLN
5000.0000 [IU] | Freq: Three times a day (TID) | INTRAMUSCULAR | Status: DC
  Administered 2024-04-15 – 2024-04-20 (×16): 5000 [IU] via SUBCUTANEOUS
  Filled 2024-04-15 (×16): qty 1

## 2024-04-15 MED ORDER — LACTATED RINGERS IV SOLN: INTRAVENOUS | Status: DC

## 2024-04-15 MED ORDER — SODIUM ZIRCONIUM CYCLOSILICATE 10 G PO PACK
10.0000 g | PACK | Freq: Once | ORAL | Status: AC
  Administered 2024-04-15: 10 g via ORAL
  Filled 2024-04-15: qty 2
  Filled 2024-04-15: qty 1

## 2024-04-15 MED ORDER — QUETIAPINE FUMARATE 25 MG PO TABS
12.5000 mg | ORAL_TABLET | Freq: Every day | ORAL | Status: DC
Start: 2024-04-15 — End: 2024-04-20
  Administered 2024-04-15 – 2024-04-19 (×5): 12.5 mg via ORAL
  Filled 2024-04-15 (×5): qty 1

## 2024-04-15 MED ORDER — ACETAMINOPHEN 650 MG RE SUPP: 650.0000 mg | Freq: Four times a day (QID) | RECTAL | Status: DC | PRN

## 2024-04-15 MED ORDER — LACTATED RINGERS IV BOLUS
1000.0000 mL | Freq: Once | INTRAVENOUS | Status: AC
Start: 2024-04-15 — End: 2024-04-15
  Administered 2024-04-15: 1000 mL via INTRAVENOUS

## 2024-04-15 MED ORDER — INSULIN ASPART 100 UNIT/ML IJ SOLN
5.0000 [IU] | Freq: Once | INTRAMUSCULAR | Status: AC
  Administered 2024-04-15: 5 [IU] via SUBCUTANEOUS
  Filled 2024-04-15: qty 1

## 2024-04-15 MED ORDER — MEMANTINE HCL 10 MG PO TABS
5.0000 mg | ORAL_TABLET | Freq: Two times a day (BID) | ORAL | Status: DC
  Administered 2024-04-15 – 2024-04-20 (×10): 5 mg via ORAL
  Filled 2024-04-15 (×10): qty 1

## 2024-04-15 MED ORDER — RIVASTIGMINE TARTRATE 3 MG PO CAPS
6.0000 mg | ORAL_CAPSULE | Freq: Two times a day (BID) | ORAL | Status: DC
  Administered 2024-04-15 – 2024-04-20 (×9): 6 mg via ORAL
  Filled 2024-04-15 (×13): qty 2

## 2024-04-15 MED ORDER — SODIUM CHLORIDE 0.45 % IV SOLN: INTRAVENOUS | Status: AC

## 2024-04-15 MED ORDER — ONDANSETRON HCL 4 MG/2ML IJ SOLN: 4.0000 mg | Freq: Four times a day (QID) | INTRAMUSCULAR | Status: DC | PRN

## 2024-04-15 MED ORDER — ASPIRIN 81 MG PO CHEW: 81.0000 mg | CHEWABLE_TABLET | Freq: Two times a day (BID) | ORAL | Status: DC

## 2024-04-15 NOTE — ED Provider Notes (Signed)
 [place] EMERGENCY DEPARTMENT AT [facility] Provider Note   CSN: [id number] Arrival date & time: 04/15/24  0945     Patient presents with: Fatigue and Altered Mental Status   [name] is a 88 y.o. male.   Patient is an 88 year old male who presents emergency department from his long-term care facility secondary to increased weakness, fatigue, altered mental status and hypotension.  It is noted by this in a facility that he does have a known left femur fracture as well as a known bedsore over the sacral region.  Patient has had no associated fever or chills.  He does have a history of dementia and limited history is obtained.  Patient does appear to be in pain on presentation.   Altered Mental Status Presenting symptoms: confusion        Prior to Admission medications   Medication Sig Start Date End Date Taking? Authorizing Provider  acetaminophen  (TYLENOL ) 500 MG tablet Take 2 tablets (1,000 mg total) by mouth every 8 (eight) hours for 14 days. 04/01/24 04/15/24  Georgina Ozell LABOR, MD  albuterol  (VENTOLIN  HFA) 108 (90 Base) MCG/ACT inhaler Inhale 2 puffs into the lungs every 4 (four) hours as needed for wheezing or shortness of breath. 04/03/24   Darci Pore, MD  aspirin  81 MG chewable tablet Chew 1 tablet (81 mg total) by mouth 2 (two) times daily. 04/01/24 05/13/24  Georgina Ozell LABOR, MD  atorvastatin  (LIPITOR) 40 MG tablet TAKE 1 TABLET EVERY DAY 03/30/24   Zollie Lowers, MD  losartan  (COZAAR ) 50 MG tablet TAKE 1 TABLET EVERY DAY 03/30/24   Zollie Lowers, MD  megestrol  (MEGACE ) 400 MG/10ML suspension Take 10 mLs (400 mg total) by mouth 2 (two) times daily. For appetite stimulation 03/17/24   Zollie Lowers, MD  memantine  (NAMENDA ) 5 MG tablet Take 1 tablet (5 mg total) by mouth 2 (two) times daily. 12/26/23   Zollie Lowers, MD  polyethylene glycol (MIRALAX ) 17 g packet Take 17 g by mouth daily for 14 days. 04/01/24 04/15/24  Georgina Ozell LABOR, MD  QUEtiapine   (SEROQUEL ) 25 MG tablet Take 1 tablet (25 mg total) by mouth at bedtime. For sleep and for nerves 03/17/24   Zollie Lowers, MD  rivastigmine  (EXELON ) 6 MG capsule Take 1 capsule (6 mg total) by mouth 2 (two) times daily. For memory 12/26/23   Zollie Lowers, MD  senna (SENOKOT) 8.6 MG TABS tablet Take 1 tablet (8.6 mg total) by mouth in the morning and at bedtime for 14 days. 04/01/24 04/15/24  Georgina Ozell LABOR, MD  triamcinolone  cream (KENALOG ) 0.1 % Apply 1 Application topically 3 (three) times daily. Avoid face and genitalia Patient taking differently: Apply 1 Application topically 2 (two) times daily as needed (itching, skin irritation). 09/25/23   Zollie Lowers, MD    Allergies: Ace inhibitors    Review of Systems  Constitutional:  Positive for fatigue.  Psychiatric/Behavioral:  Positive for confusion.   All other systems reviewed and are negative.   Updated Vital Signs BP 133/81   Temp 97.7 F (36.5 C) (Axillary)   Resp 18   Ht 5' 9.5 (1.765 m)   Wt 63.4 kg   BMI 20.35 kg/m   Physical Exam Vitals and nursing note reviewed.  Constitutional:      General: He is not in acute distress.    Appearance: Normal appearance. He is not ill-appearing.  HENT:     Head: Normocephalic and atraumatic.     Nose: Nose normal.  Mouth/Throat:     Mouth: Mucous membranes are moist.  Eyes:     Extraocular Movements: Extraocular movements intact.     Conjunctiva/sclera: Conjunctivae normal.     Pupils: Pupils are equal, round, and reactive to light.  Cardiovascular:     Rate and Rhythm: Normal rate and regular rhythm.     Pulses: Normal pulses.     Heart sounds: Normal heart sounds. No murmur heard.    No gallop.  Pulmonary:     Effort: Pulmonary effort is normal. No respiratory distress.     Breath sounds: Normal breath sounds. No stridor. No wheezing, rhonchi or rales.  Abdominal:     General: Abdomen is flat. Bowel sounds are normal. There is no distension.     Palpations: Abdomen  is soft.     Tenderness: There is no abdominal tenderness. There is no guarding.  Musculoskeletal:        General: Normal range of motion.     Cervical back: Normal range of motion and neck supple. No rigidity or tenderness.     Right lower leg: No edema.     Left lower leg: No edema.     Comments: Tenderness to palpation of the left femoral region, nontender palpation of remainder of lumbar joints, pelvis stable to AP and lateral compression, peripheral pulses 2+ throughout  Skin:    General: Skin is warm and dry.     Comments: Stage I sore over the sacral region, ingrown nail noted to the left great toe with minimal erythema, no purulent discharge, no gangrenous changes  Neurological:     General: No focal deficit present.     Mental Status: He is alert and oriented to person, place, and time. Mental status is at baseline.  Psychiatric:        Mood and Affect: Mood normal.        Behavior: Behavior normal.        Thought Content: Thought content normal.        Judgment: Judgment normal.     (all labs ordered are listed, but only abnormal results are displayed) Labs Reviewed  CULTURE, BLOOD (ROUTINE X 2)  CULTURE, BLOOD (ROUTINE X 2)  RESP PANEL BY RT-PCR (RSV, FLU A&B, COVID)  [id number]  COMPREHENSIVE METABOLIC PANEL WITH GFR  CBC WITH DIFFERENTIAL/PLATELET  LACTIC ACID, PLASMA  LACTIC ACID, PLASMA  URINALYSIS, ROUTINE W REFLEX MICROSCOPIC  TROPONIN T, HIGH SENSITIVITY    EKG: None  Radiology: No results found.   .Critical Care  Performed by: Daralene Lonni BIRCH, PA-C Authorized by: Daralene Lonni BIRCH, PA-C   Critical care provider statement:    Critical care time (minutes):  35   Critical care was necessary to treat or prevent imminent or life-threatening deterioration of the following conditions:  Sepsis   Critical care was time spent personally by me on the following activities:  Development of treatment plan with patient or surrogate, discussions with  consultants, evaluation of patient's response to treatment, examination of patient, ordering and review of laboratory studies, ordering and review of radiographic studies, ordering and performing treatments and interventions, pulse oximetry, re-evaluation of patient's condition and review of old charts   I assumed direction of critical care for this patient from another provider in my specialty: no     Care discussed with: admitting provider      Medications Ordered in the ED  lactated ringers  bolus 1,000 mL (has no administration in time range)  morphine  (PF) 4 MG/ML injection 2  mg (has no administration in time range)  ondansetron  (ZOFRAN ) injection 4 mg (has no administration in time range)                                    Medical Decision Making Amount and/or Complexity of Data Reviewed Labs: ordered. Radiology: ordered.  Risk Prescription drug management. Decision regarding hospitalization.   This patient presents to the ED for concern of fatigue, altered mental status, hypotension, this involves an extensive number of treatment options, and is a complaint that carries with it a high risk of complications and morbidity.  The differential diagnosis includes sepsis, pneumonia, urinary tract infection, dehydration, acute kidney injury, electrolyte derangement, CVA, TIA   Co morbidities that complicate the patient evaluation  Dementia   Additional history obtained:  Additional history obtained from family External records from outside source obtained and reviewed including medical records   Lab Tests:  I Ordered, and personally interpreted labs.  The pertinent results include: Leukocytosis noted, anemia at baseline, elevated sodium, potassium, chloride, elevated glucose, low bicarb, elevated anion gap, creatinine at baseline, elevated lactic acid, unremarkable urinalysis, elevated troponin, negative viral swab   Imaging Studies ordered:  I ordered imaging studies  including CT head, CT chest abdomen pelvis I independently visualized and interpreted imaging which showed no acute intracranial process, no acute process noted within the chest abdomen and pelvis I agree with the radiologist interpretation   Cardiac Monitoring: / EKG:  The patient was maintained on a cardiac monitor.  I personally viewed and interpreted the cardiac monitored which showed an underlying rhythm of: Normal sinus rhythm, no ST/T wave changes, no ischemic changes, no STEMI   Consultations Obtained:  I requested consultation with the hospitalist,  and discussed lab and imaging findings as well as pertinent plan - they recommend: Admission   Problem List / ED Course / Critical interventions / Medication management  Patient is doing well at this time and does remain stable.  Discussed with family that we will plan for admission to the hospital given his apparent dehydration and possible sepsis.  Though do suspect that his lab abnormalities are secondary to dehydration.  He was still covered with antibiotics at this time.  CT head, chest abdomen pelvis was unremarkable.  Urinalysis demonstrates no indication of urinary tract infection.  Troponin is elevated but do suspect this is demand at this time.  Have discussed patient case with Dr. Ricky with the hospitalist service who has excepted for admission.   I ordered medication including IV fluids, insulin , Zofran , Rocephin , azithromycin  for dehydration, possible sepsis Reevaluation of the patient after these medicines showed that the patient improved I have reviewed the patients home medicines and have made adjustments as needed   Social Determinants of Health:  None   Test / Admission - Considered:  Admission     Final diagnoses:  None    ED Discharge Orders     None          Daralene Lonni JONETTA DEVONNA 04/15/24 1224    Suzette Pac, MD 04/16/24 1004

## 2024-04-15 NOTE — Sepsis Progress Note (Signed)
 eLink is following this Code Sepsis.

## 2024-04-15 NOTE — Evaluation (Signed)
 Clinical/Bedside Swallow Evaluation Patient Details  Name: [name] MRN: [id number] Date of Birth: Jan 17, 1936  Today's Date: 04/15/2024 Time: SLP Start Time (ACUTE ONLY): 1338 SLP Stop Time (ACUTE ONLY): 1353 SLP Time Calculation (min) (ACUTE ONLY): 15 min  Past Medical History:  Past Medical History:  Diagnosis Date   Anxiety    Asthma    Choledocholithiasis 02/24/2013   COPD (chronic obstructive pulmonary disease) (HCC)    CVA (cerebral vascular accident) (HCC) 2013   loss of memory   Dementia (HCC)    Diabetes mellitus    Hx-TIA (transient ischemic attack)    Hyperlipidemia    Hypertension    Past Surgical History:  Past Surgical History:  Procedure Laterality Date   CATARACT EXTRACTION W/PHACO Left 04/30/2016   Procedure: CATARACT EXTRACTION PHACO AND INTRAOCULAR LENS PLACEMENT LEFT EYE CDE=9.83;  Surgeon: Cherene Mania, MD;  Location: [facility];  Service: Ophthalmology;  Laterality: Left;  left   CHOLECYSTECTOMY N/A 02/23/2013   Procedure: LAPAROSCOPIC CHOLECYSTECTOMY;  Surgeon: Oneil DELENA Budge, MD;  Location: [facility];  Service: General;  Laterality: N/A;   ERCP N/A 02/24/2013   Procedure: ENDOSCOPIC RETROGRADE CHOLANGIOPANCREATOGRAPHY;  Surgeon: Lamar CHRISTELLA Hollingshead, MD;  Location: [facility];  Service: Endoscopy;  Laterality: N/A;   INTRAMEDULLARY (IM) NAIL INTERTROCHANTERIC Left 03/30/2024   Procedure: FIXATION, FRACTURE, INTERTROCHANTERIC, WITH INTRAMEDULLARY ROD;  Surgeon: Georgina Ozell DELENA, MD;  Location: [facility];  Service: Orthopedics;  Laterality: Left;   ROTATOR CUFF REPAIR Right    HPI:  Patient is an 88 year old male who presents emergency department from his long-term care facility secondary to increased weakness, fatigue, altered mental status and hypotension.  It is noted by this in a facility that he does have a known left femur fracture as well as a known bedsore over the sacral region.  Patient has had no associated fever or chills.  He does have a history of dementia and  limited history is obtained.  Patient does appear to be in pain on presentation. ST consulted for swallow evaluation d/t nursing staff in ED stating pt exhibited dysphagia.    Assessment / Plan / Recommendation  Clinical Impression  Pt seen for clinical swallow evaluation d/t reported dysphagia in ED.  Pt agreeable to oral care, but confused when SLP entered room and was only oriented to self.  Pt followed simple oral commands (ie: open mouth, protrude tongue, etc.) with missing dentition in poor condition observed during oral care.  Pt exhibited cognitive-based dysphagia c/b oral holding, prolonged oral preparation/transit, delay in the initiation of the swallow and slight oral residue.  Pt required mod verbal cues for achieving negative pressure with straw initially, but once cued, he was able to consume thin via straw without apparent difficulty.  Pt did not exhibit any overt s/s of aspiration, but was distracted by pain during assessment as he would moan or cry out during the assessment, but was easily redirected and eager to consume food/liquids.  Recommend continue conservative diet of Dysphagia 2(minced)/thin liquids with FULL supervision/A during meals. ST will f/u for education with family/diet tolerance and dysphagia tx during acute stay.  Thank you for this consult. SLP Visit Diagnosis: Dysphagia, unspecified (R13.10)    Aspiration Risk  Mild aspiration risk    Diet Recommendation   Thin;Dysphagia 2 (chopped)  Medication Administration: Whole meds with puree    Other  Recommendations Oral Care Recommendations: Oral care BID;Staff/trained caregiver to provide oral care     Assistance Recommended at Discharge  FULL  Functional  Status Assessment Patient has had a recent decline in their functional status and demonstrates the ability to make significant improvements in function in a reasonable and predictable amount of time.  Frequency and Duration min 1 x/week  1 week       Prognosis  Prognosis for improved oropharyngeal function: Good Barriers to Reach Goals: Cognitive deficits      Swallow Study   General Date of Onset: 04/15/24 HPI: Patient is an 88 year old male who presents emergency department from his long-term care facility secondary to increased weakness, fatigue, altered mental status and hypotension.  It is noted by this in a facility that he does have a known left femur fracture as well as a known bedsore over the sacral region.  Patient has had no associated fever or chills.  He does have a history of dementia and limited history is obtained.  Patient does appear to be in pain on presentation. ST consulted for swallow evaluation d/t nursing staff in ED stating pt exhibited dysphagia. Type of Study: Bedside Swallow Evaluation Previous Swallow Assessment: n/a Diet Prior to this Study: Dysphagia 2 (finely chopped);Thin liquids (Level 0) Temperature Spikes Noted: No Respiratory Status: Room air History of Recent Intubation: No Behavior/Cognition: Alert;Confused;Requires cueing Oral Cavity Assessment: Dry Oral Care Completed by SLP: Yes Oral Cavity - Dentition: Poor condition;Missing dentition Self-Feeding Abilities: Total assist Patient Positioning: Upright in bed Baseline Vocal Quality: Low vocal intensity Volitional Cough: Cognitively unable to elicit Volitional Swallow: Unable to elicit    Oral/Motor/Sensory Function Overall Oral Motor/Sensory Function: Other (comment) (DTA d/t pt's mental status being impaired/in pain)   Ice Chips Ice chips: Impaired Presentation: Spoon Oral Phase Impairments: Impaired mastication Oral Phase Functional Implications: Prolonged oral transit Pharyngeal Phase Impairments: Suspected delayed Swallow Other Comments: cognitive-based dysphagia   Thin Liquid Thin Liquid: Impaired Presentation: Spoon;Straw Oral Phase Functional Implications: Oral holding Pharyngeal  Phase Impairments: Suspected delayed Swallow Other Comments:  Pt required cues to achieve negative pressure with straw; biting initially, but once cued, he complied with straw use    Nectar Thick Nectar Thick Liquid: Not tested   Honey Thick Honey Thick Liquid: Not tested   Puree Puree: Impaired Presentation: Spoon Oral Phase Impairments: Reduced lingual movement/coordination Oral Phase Functional Implications: Oral holding Pharyngeal Phase Impairments: Suspected delayed Swallow   Solid     Solid: Impaired Presentation: Spoon Oral Phase Impairments: Impaired mastication;Reduced lingual movement/coordination Oral Phase Functional Implications: Impaired mastication;Prolonged oral transit;Oral holding      Pat Merelin Human,M.S., CCC-SLP 04/15/2024,2:09 PM

## 2024-04-15 NOTE — ED Triage Notes (Signed)
 Patient BIB [facility], From [facility], Called out for hypotension and lethergy. EMS informed patient has a left femur fracture beds sore to back, has started doxycycline  on yesterday. Hx of dementia.  EMS vitals 102/63, pulse 52.

## 2024-04-15 NOTE — H&P (Addendum)
 History and Physical    Patient: [name] FMW:[id number] DOB: 1936-01-27 DOA: 04/15/2024 DOS: the patient was seen and examined on 04/15/2024 PCP: Zollie Lowers, MD   Patient coming from: SNF  Chief Complaint:  Chief Complaint  Patient presents with   Fatigue   Altered Mental Status   HPI: [name] is a 88 y.o. male with medical history significant of hypertension, hyperlipidemia, dementia, prior history of stroke, prediabetes and chronic kidney disease stage IV; who presented to the emergency department from the skilled nursing facility secondary to worsening baseline mentation, decreased oral intake and being less interactive.  Patient with chronic sacral wound for which he was recently started on doxycycline ; also with not too long ago hospitalization secondary to left hip fracture.  Facility reported decreased oral intake and there were concern for dehydration.  There has not been any fever, chills, chest pain, nausea, vomiting, dysuria, hematuria, melena, hematochezia, shortness of breath or sick contacts.  Workup in the ED demonstrating a CT head without acute intracranial normalities; chest x-ray without acute cardiopulmonary process, urinalysis not demonstrating any signs of acute infection.  Blood work with significant hyponatremia, acute on chronic renal failure and significant metabolic acidosis.  Initially there was concern for potential sepsis and patient was called for with Rocephin  and ceftriaxone ; in the absence of source of infection antibiotic has been discontinue and efforts has been tailor to provide treatment of acute on chronic renal failure, dehydration and hyponatremia.  TRH has been consulted to place patient in the hospital for further evaluation and management.  Review of Systems: As mentioned in the history of present illness. All other systems reviewed and are negative. Past Medical History:  Diagnosis Date   Anxiety    Asthma    Choledocholithiasis  02/24/2013   COPD (chronic obstructive pulmonary disease) (HCC)    CVA (cerebral vascular accident) (HCC) 2013   loss of memory   Dementia (HCC)    Diabetes mellitus    Hx-TIA (transient ischemic attack)    Hyperlipidemia    Hypertension    Past Surgical History:  Procedure Laterality Date   CATARACT EXTRACTION W/PHACO Left 04/30/2016   Procedure: CATARACT EXTRACTION PHACO AND INTRAOCULAR LENS PLACEMENT LEFT EYE CDE=9.83;  Surgeon: Cherene Mania, MD;  Location: [facility];  Service: Ophthalmology;  Laterality: Left;  left   CHOLECYSTECTOMY N/A 02/23/2013   Procedure: LAPAROSCOPIC CHOLECYSTECTOMY;  Surgeon: Oneil DELENA Budge, MD;  Location: [facility];  Service: General;  Laterality: N/A;   ERCP N/A 02/24/2013   Procedure: ENDOSCOPIC RETROGRADE CHOLANGIOPANCREATOGRAPHY;  Surgeon: Lamar CHRISTELLA Hollingshead, MD;  Location: [facility];  Service: Endoscopy;  Laterality: N/A;   INTRAMEDULLARY (IM) NAIL INTERTROCHANTERIC Left 03/30/2024   Procedure: FIXATION, FRACTURE, INTERTROCHANTERIC, WITH INTRAMEDULLARY ROD;  Surgeon: Georgina Ozell DELENA, MD;  Location: [facility];  Service: Orthopedics;  Laterality: Left;   ROTATOR CUFF REPAIR Right    Social History:  reports that he has never smoked. He has never used smokeless tobacco. He reports that he does not drink alcohol and does not use drugs.  Allergies  Allergen Reactions   Ace Inhibitors Swelling    Family History  Problem Relation Age of Onset   Hypertension Mother    Hypertension Father    Diabetes Sister    Diabetes Sister    Heart disease Brother    Emphysema Brother    Colon cancer Neg Hx     Prior to Admission medications   Medication Sig Start Date End Date Taking? Authorizing Provider  acetaminophen  (TYLENOL ) 500 MG tablet Take 2 tablets (1,000 mg total) by mouth every 8 (eight) hours for 14 days. 04/01/24 04/15/24  Georgina Ozell LABOR, MD  albuterol  (VENTOLIN  HFA) 108 (90 Base) MCG/ACT inhaler Inhale 2 puffs into the lungs every 4 (four) hours as needed for wheezing  or shortness of breath. 04/03/24   Darci Pore, MD  aspirin  81 MG chewable tablet Chew 1 tablet (81 mg total) by mouth 2 (two) times daily. 04/01/24 05/13/24  Georgina Ozell LABOR, MD  atorvastatin  (LIPITOR) 40 MG tablet TAKE 1 TABLET EVERY DAY 03/30/24   Zollie Lowers, MD  losartan  (COZAAR ) 50 MG tablet TAKE 1 TABLET EVERY DAY 03/30/24   Zollie Lowers, MD  megestrol  (MEGACE ) 400 MG/10ML suspension Take 10 mLs (400 mg total) by mouth 2 (two) times daily. For appetite stimulation 03/17/24   Zollie Lowers, MD  memantine  (NAMENDA ) 5 MG tablet Take 1 tablet (5 mg total) by mouth 2 (two) times daily. 12/26/23   Zollie Lowers, MD  polyethylene glycol (MIRALAX ) 17 g packet Take 17 g by mouth daily for 14 days. 04/01/24 04/15/24  Georgina Ozell LABOR, MD  QUEtiapine  (SEROQUEL ) 25 MG tablet Take 1 tablet (25 mg total) by mouth at bedtime. For sleep and for nerves 03/17/24   Zollie Lowers, MD  rivastigmine  (EXELON ) 6 MG capsule Take 1 capsule (6 mg total) by mouth 2 (two) times daily. For memory 12/26/23   Zollie Lowers, MD  senna (SENOKOT) 8.6 MG TABS tablet Take 1 tablet (8.6 mg total) by mouth in the morning and at bedtime for 14 days. 04/01/24 04/15/24  Georgina Ozell LABOR, MD  triamcinolone  cream (KENALOG ) 0.1 % Apply 1 Application topically 3 (three) times daily. Avoid face and genitalia Patient taking differently: Apply 1 Application topically 2 (two) times daily as needed (itching, skin irritation). 09/25/23   Zollie Lowers, MD    Physical Exam: Vitals:   04/15/24 1030 04/15/24 1045 04/15/24 1049 04/15/24 1145  BP: 130/64 (!) 77/64 (!) 105/58 (!) 104/43  Pulse: 96 90 92 85  Resp: 19 17 20    Temp:      TempSrc:      SpO2: 100% 100% 100% 100%  Weight:      Height:       General exam: Alert, awake, oriented x 1; able to follow simple commands.  Chronically ill in appearance. Respiratory system: Good saturation on room air; no using accessory muscles. Cardiovascular system:RRR. No rubs or gallops; no  JVD. Gastrointestinal system: Abdomen is nondistended, soft and nontender. No organomegaly or masses felt. Normal bowel sounds heard. Central nervous system:  No focal neurological deficits. Extremities: No cyanosis or clubbing. Skin: No petechiae; stage I sacral pressure injury and a stage II left buttocks pressure injury; no acute drainage appreciated and no signs of significant superimposed infection. Psychiatry: Judgement and insight appear impaired secondary to underlying dementia.  Data Reviewed: CBC: WBC 17.5, hemoglobin 10.7, platelet count 467K Comprehensive metabolic panel: Sodium 07/09/1954, potassium 5.8, chloride 119, bicarb 16, BUN 103, creatinine 2.82, normal LFTs except for alk phos of 131 and a GFR of 21.  Anion gap 17. Respiratory panel by PCR: Negative COVID, influenza and RSV Lactic acid: 5.1 >> 3.7  Assessment and Plan: 1-acute metabolic encephalopathy - appears to be secondary to dehydration and hypernatremia - Fluid resuscitation has been initiated - Will follow electrolytes trend - Continue supportive care - Provide consult reorientation and supportive care.  2-leukocytosis - Appears to be in the setting of a stress demargination, hemoconcentration  and dehydration - No acute source of infection appreciated - Holding on antibiotic therapy at the moment - Continue fluid resuscitation as mentioned above - Follow WBCs trend.  3-acute on chronic renal failure - Stage IV at baseline - Will minimize the use of nephrotoxic agents, avoid hypotension and the use of contrast - Provide fluid resuscitation - Follow-up renal function trend.  4-hypertension - Blood pressure currently stable - Holding the use of Cozaar  in the setting of acute on chronic renal failure - Follow vital signs.  5-history of dementia - Continue constant reorientation - Will continue the use of Namenda  and Exelon  -Provide supportive care and assistance.  6--hyperlipidemia - Continue  statin.  7-history of diabetes with nephropathy - No longer taking any medication - Most recent A1c 5.0 - Following diet control as an outpatient - Continue to follow CBGs; no planning for correction unless above 200.  8-hyperkalemia - Serum bicarbonate provide - No acute telemetry changes appreciated - Lokelma given - Follow electrolytes trend. - Continue telemetry monitoring.    Advance Care Planning:   Code Status: Full Code   Consults: None  Family Communication: No family at bedside.  Severity of Illness: The appropriate patient status for this patient is INPATIENT. Inpatient status is judged to be reasonable and necessary in order to provide the required intensity of service to ensure the patient's safety. The patient's presenting symptoms, physical exam findings, and initial radiographic and laboratory data in the context of their chronic comorbidities is felt to place them at high risk for further clinical deterioration. Furthermore, it is not anticipated that the patient will be medically stable for discharge from the hospital within 2 midnights of admission.   * I certify that at the point of admission it is my clinical judgment that the patient will require inpatient hospital care spanning beyond 2 midnights from the point of admission due to high intensity of service, high risk for further deterioration and high frequency of surveillance required.*  Author: Eric Nunnery, MD 04/15/2024 12:20 PM  For on call review [email or web address].

## 2024-04-15 NOTE — Sepsis Progress Note (Signed)
 Confirmed with bedside RN [place] that blood cultures were drawn before the antibiotic was given.

## 2024-04-16 DIAGNOSIS — R7989 Other specified abnormal findings of blood chemistry: Secondary | ICD-10-CM | POA: Diagnosis not present

## 2024-04-16 DIAGNOSIS — E872 Acidosis, unspecified: Secondary | ICD-10-CM | POA: Diagnosis not present

## 2024-04-16 DIAGNOSIS — F03C0 Unspecified dementia, severe, without behavioral disturbance, psychotic disturbance, mood disturbance, and anxiety: Secondary | ICD-10-CM

## 2024-04-16 DIAGNOSIS — E870 Hyperosmolality and hypernatremia: Secondary | ICD-10-CM | POA: Diagnosis not present

## 2024-04-16 DIAGNOSIS — R4182 Altered mental status, unspecified: Secondary | ICD-10-CM | POA: Diagnosis not present

## 2024-04-16 LAB — BASIC METABOLIC PANEL WITH GFR
Anion gap: 13 (ref 5–15)
BUN: 93 mg/dL — ABNORMAL HIGH (ref 8–23)
CO2: 16 mmol/L — ABNORMAL LOW (ref 22–32)
Calcium: 8.8 mg/dL — ABNORMAL LOW (ref 8.9–10.3)
Chloride: 121 mmol/L — ABNORMAL HIGH (ref 98–111)
Creatinine, Ser: 2.15 mg/dL — ABNORMAL HIGH (ref 0.61–1.24)
GFR, Estimated: 29 mL/min — ABNORMAL LOW (ref >60)
Glucose, Bld: 146 mg/dL — ABNORMAL HIGH (ref 70–99)
Potassium: 4.9 mmol/L (ref 3.5–5.1)
Sodium: 151 mmol/L — ABNORMAL HIGH (ref 135–145)

## 2024-04-16 LAB — CBC
HCT: 30.3 % — ABNORMAL LOW (ref 39.0–52.0)
Hemoglobin: 9.1 g/dL — ABNORMAL LOW (ref 13.0–17.0)
MCH: 31.4 pg (ref 26.0–34.0)
MCHC: 30 g/dL (ref 30.0–36.0)
MCV: 104.5 fL — ABNORMAL HIGH (ref 80.0–100.0)
Platelets: 361 K/uL (ref 150–400)
RBC: 2.9 MIL/uL — ABNORMAL LOW (ref 4.22–5.81)
RDW: 13.6 % (ref 11.5–15.5)
WBC: 13 K/uL — ABNORMAL HIGH (ref 4.0–10.5)
nRBC: 0 % (ref 0.0–0.2)

## 2024-04-16 LAB — HEMOGLOBIN A1C
Hgb A1c MFr Bld: 5.3 % (ref 4.8–5.6)
Mean Plasma Glucose: 105.41 mg/dL

## 2024-04-16 MED ORDER — SODIUM CHLORIDE 0.45 % IV SOLN: INTRAVENOUS | Status: AC

## 2024-04-16 MED ORDER — DOXYCYCLINE HYCLATE 100 MG PO TABS
100.0000 mg | ORAL_TABLET | Freq: Two times a day (BID) | ORAL | Status: DC
  Administered 2024-04-16 – 2024-04-20 (×9): 100 mg via ORAL
  Filled 2024-04-16 (×9): qty 1

## 2024-04-16 MED ORDER — ORAL CARE MOUTH RINSE: 15.0000 mL | OROMUCOSAL | Status: DC | PRN

## 2024-04-16 MED ORDER — MUPIROCIN CALCIUM 2 % EX CREA
TOPICAL_CREAM | Freq: Every day | CUTANEOUS | Status: DC
  Administered 2024-04-16: 1 via TOPICAL
  Filled 2024-04-16 (×2): qty 15

## 2024-04-16 NOTE — Consult Note (Addendum)
 WOC Nurse Consult Note: Consult requested for left great toe.  Performed remotely after review of progress notes and photo in the EMR.  Pt has an ingrown toenail to the area which is red, painful and swollen.Toe is red, painful, and swollen surrounding the nailbed. There is a moist macerated partial thickness skin loss to the space between the great toe and 2nd toe. Pt is already on systemic antibiotics for infection.   Topical treatment orders provided for bedside nurses to perform as follows to provide antimicrobial benefits: Apply Bactroban to left great toe nailbed and in between toe space Q day, then cover with foam dressings.  Change foam dressings Q 3 days or PRN soiling.   Primary team: If affected area declines, please consult podiatry for further recommendations.   Please re-consult if further assistance is needed.  Thank-you,  Stephane Fought MSN, RN, CWOCN, CWCN-[facility], CNS Contact Mon-Fri 0700-1500: [phone]

## 2024-04-16 NOTE — Consult Note (Signed)
 Consultation Note Date: 04/16/2024   [name]  DOB: 1935-09-09  MRN: [id number]  Age / Sex: 88 y.o., male  PCP: Zollie Lowers, MD Referring Physician: Ricky Fines, MD  Reason for Consultation: Establishing goals of care  HPI/Patient Profile: 88 y.o. male  with past medical history of dementia, hypertension, hyperlipidemia, history of TIA, CVA, diabetes, COPD, CKD stage IV, and anxiety admitted on 04/15/2024 with worsening mentation and decreased oral intake.   Workup revealed significant hypernatremia, acute on chronic renal failure, and metabolic acidosis concerning for severe dehydration.  White blood cell count and lactic acid initially elevated and therefore received IV antibiotics however, no source of infection localized.  Findings likely secondary to hemoconcentration and metabolic acidosis from severe volume depletion.  Prior hospital encounters and outside specialty notes reviewed.  Patient had recent hospitalization from 03/29/2024 through 04/03/2024 for fall with left hip fracture and open wound of left toe.  He was discharged to skilled nursing facility.  I also see prior outpatient PCP visits with [facility] where he had also been followed for his chronic conditions.  Most recently seen 03/17/2024 for the toe infection, weight loss, and hypertension.  PMT has been consulted to assist with goals of care conversation.  Today, labs independently reviewed.  Sodium level gradually improving (10/9- 151<<152<<158<<1 53-10/8).  Chloride level also elevated in the 120s.  CO2 level low in the 16-17 range.  Renal function elevated although improving (10/9-creatinine 2.15<<2.29<<2.87<<2.82-04/15/2024).  Lab findings consistent with diagnosis of dehydration.  Continue IV fluids.  Mild hypocalcemia noted.  Questionable significance.  Arterial blood gas reviewed and revealed low O2 at less than 31, normal pH, bicarb low at 18.2,  acid base deficit of 6.4, and pCO2 low at 32.  Compensated metabolic acidosis with severe hypoxemia in the setting of dehydration.  Patient with known COPD.  Troponin level elevated at 132.  Likely due to demand ischemia from dehydration.  Would consider recheck to ensure trending down or flat.  CBC reviewed and revealed white blood cell count progressively improving (10/9-13<<17.7<<17.5).  Hemoglobin decreased from previous could be dilutional (10/9-9.1<<10.2<<10.7 -10/8).  Pro calcitonin negative. Reassuring less likely infection. Would continue IV fluids and lab monitoring. EKG independently reviewed.  Mild sinus tachycardia noted with some bigeminy.  QTc borderline at 441.CT head and chest abdomen and pelvis reviewed.  No intracranial pathology noted although he does have some atrophy consistent with age and dementia diagnosis.  Small chronic loculated right pleural effusion noted.  Intramedullary nail fixation of the left proximal femur visible with some acute fracture fragments.  Also noted to have coronary artery disease and aortic atherosclerosis.  Chest x-ray independently reviewed.  Small linear density at right lung base visible.  Otherwise lungs clear.  Vital signs reviewed. Overall, vital signs appear relatively stable with some diastolic hypotension noted.  Heart rate normal.  O2 sat stable on room air.  Medication administration record reviewed.  Patient required 650 mg of Tylenol  in 24-hour look back.  Otherwise no as needed symptom meds administered.    Today, patient is sleeping and does not arouse to verbal stimuli. Therefore independent history obtained from family and nursing staff. See below for more details.   Per nursing: did not eat breakfast, ate some of his lunch but had to be fed by his wife. Able to take meds crushed in apple sauce. Has been quite drowsy.   See below for history per family.   Clinical Assessment and Goals of Care:  I have reviewed medical records including EPIC  notes, labs and imaging (independently reviewed), medication administration record, vital signs, prior hospital encounters/outside specialist notes, assessed the patient and then met with patient and family to discuss diagnosis prognosis, GOC, EOL wishes, disposition and options. Collaborated directly with attending physician, TOC, and bedside nursing staff.   I introduced Palliative Medicine as specialized medical care for people living with serious illness. It focuses on providing relief from the symptoms and stress of a serious illness. The goal is to improve quality of life for both the patient and the family.  We discussed a brief life review of the patient and then focused on their current illness. Wife [name]) and daughter at bedside and participated in discussion.   I attempted to elicit values and goals of care important to the patient.    Medical History Review and Family/Patient Understanding:   Engaged in a detailed conversation with the family (wife and daughter) about the seriousness of his current illness, in light of his advanced age and complex medical history, and explained the potential implications this may have for his recovery and long-term well-being.  Also discussed the dementia disease trajectory and the impact acute illnesses and injuries such as his recent hip fracture can have on this trajectory.  Also discussed findings and patient that indicate he is in the final stages of dementia.  Social History:  Patient and his wife have been married for 63 years and had 6 children together.  1 child passed away.  They do have a son and the rest were daughters. He has numerous grandchildren and great-grandchildren.  He was a tobacco farmer and worked in Designer, fashion/clothing.  He was always very busy and active.  Had large gardens, used to play and loves baseball, and cared for everyone in his family as well as those in his community.  Functional and Nutritional State:   Prior to his  hospitalization for hip fracture he was living at home and was quite mobile.  He would walk to his family's home around the property.  He did require some assistance with bathing and dressing but was able to self toilet and feed himself.  However, after he had the fall and hip fracture he has had a significant decline in his functional ability.  Prior to this hospitalization he was at the [facility] for rehab but was not making very much progress and was needing a lot of care for all ADLs including feeding.  He has been losing weight for some time now.  Has a significant decline in his weight and appetite after the surgery for his hip.  Palliative Symptoms:  None currently  Advance Directives/Goals of Care/Anticipatory care planning Discussion:  The family consented to a voluntary Advance Care Planning Conversation in person. Individuals present for the conversation: This nurse practitioner, wife Rashon Westrup), and daughter  A detailed discussion regarding GOC, advanced directives, and anticipatory care planning was had. I discussed with patient's family our concerns regarding his acute illness and the progression of his chronic condition in the context of her existing comorbidities, and explained how this could potentially impact his prognosis and hasten his dementia related declines.  Discussed prognosis in the frail elderly with dementia after hip fracture.  Patient's family states that they have already seen significant changes in him after the hip fracture.  They state he is like a completely different person.  He went from being functional and ambulatory to completely dependent and lethargic, not really conversing  as much.  We talked about the impact hip fractures can have on dementia disease trajectory and that he appears to be in the final stages of his dementia.  We also discussed how these injuries and the subsequent immobility can accelerate decline.  We talked about how hard it was to see him  like this and to go from a vibrant man to where he is now.  We discussed goals of care.  At this point they feel that optimizing his comfort and quality of life are of high priority.  They believe he would not consider a life where he is completely bedbound and reliant on others or machines to survive as one with acceptable quality.  He does not have a living will or formal advanced directive but his wife would be his health surrogate (no formal HCPOA paperwork).  Given goals of care to focus on comfort and quality of life and his family desire for him to avoid suffering, engaged in discussion of advance directives including the limitations and potential burdens of CPR and intubation, particularly in the context of advanced age and serious underlying health conditions. We discussed how the potential consequences of surviving CPR could significantly affect a person's quality of life--particularly if their definition of quality of life involves maintaining independence and functional ability. Following a goals-of-care conversation, the wife and daughter opted for DNR/DNI status and this has been documented in the medical record.  They remain hopeful that he will continue to improve and be able to return home.  Therefore, status is to be DNR/DNI, treat the treatable. We had a detailed discussion regarding the potential benefits and limitations of enteral feeding, specifically the limitations noted in individuals with advanced dementia or other irreversible conditions.  Patient's wife states that they are he decided they would not want him to have a feeding tube.  [place] DNR form completed.  We reviewed the completion of the [employer] form, emphasizing that it constitutes a medical order and highlighting the importance of retaining a copy in a clearly visible location within the home. Will plan to follow-up tomorrow to complete MOST form once wife has a chance to speak with all of her children.  Patient's wife  states they are hopeful to bring him home.  She knows she will need help and equipment such as a hospital bed.  Given goals of care are to focus on comfort and quality of life, hospice versus outpatient palliative referral would be appropriate. Reviewed hospice philosophy of care, including focus on comfort, quality of life, and support services that are provided for patients and families facing terminal illness. Discussed eligibility criteria and potential benefits of enrollment.  Also provided education to the patient's wife and daughter about the differences between hospice and palliative care and provided education on the philosophy and goals of palliative care, including its focus on symptom management and quality of life and the services provided. Reviewed eligibility for outpatient services and offered a referral.  Patient's wife and daughter wish to speak with her other family members before making decisions about this.  Agrees to follow-up tomorrow to further discuss.  Discussed the importance of continued conversation with family and the medical providers regarding overall plan of care and treatment options, ensuring decisions are within the context of the patient's values and GOCs.   Questions and concerns were addressed.  Hard Choices booklet left for review. The family was encouraged to call with questions or concerns.  PMT will continue to support holistically.  Outcome  of the conversations and/or documents completed: CODE STATUS updated from full code to DNR/DNI, goldenrod form completed  I spent 35 minutes providing separately identifiable ACP services with the patient and/or surrogate decision maker in a voluntary, in-person conversation discussing the patient's wishes and goals as detailed in the above note.  Primary Decision maker and health care surrogate:  Patient's wife Salih Williamson)  Code Status:  DNR/DNI    SUMMARY OF RECOMMENDATIONS    DNR/DNI Continue to treat the  treatable PT evaluation to determine mobility status and therapy potential No changes to symptom regimen as symptoms appear stable at present Palliative medicine team will continue to follow for ongoing goals of care discussion, symptom management, and coordination of care.  Code Status/Advance Care Planning: DNR   Symptom Management:  Symptoms stable at present, therefore continue symptom regimen per admitting team with PMT available as needed for support  Psycho-social/Spiritual:  Desire for further Chaplaincy support:no, they have had individuals already come visit to provide them spiritual support in their community  Prognosis:  < 6 months  Discharge Planning: To Be Determined      Primary Diagnoses: Present on Admission:  Hypernatremia    Physical Exam Constitutional:      General: He is not in acute distress.    Appearance: He is not toxic-appearing.     Comments: Chronically frail and ill-appearing  Pulmonary:     Effort: Pulmonary effort is normal. No respiratory distress.  Skin:    General: Skin is warm and dry.  Neurological:     Mental Status: He is lethargic.     Comments: Does not respond to verbal stimuli     Vital Signs: BP (!) 112/38 (BP Location: Right Arm)   Pulse 74   Temp 98.6 F (37 C) (Axillary)   Resp 12   Ht 5' 9 (1.753 m)   Wt 60.3 kg   SpO2 100%   BMI 19.63 kg/m  Pain Scale: PAINAD POSS *See Group Information*: 1-Acceptable,Awake and alert     SpO2: SpO2: 100 % O2 Device:SpO2: 100 % O2 Flow Rate: .    Palliative Assessment/Data: 20 to 30%     Billing based on MDM: High  Problems Addressed: One acute or chronic illness or injury that poses a threat to life or bodily function  Amount and/or Complexity of Data: Category 1:Review of prior external note(s) from each unique source and Assessment requiring an independent historian(s), Category 2:Independent interpretation of a test performed by another physician/other  qualified health care professional (not separately reported), and Category 3:Discussion of management or test interpretation with external physician/other qualified health care professional/appropriate source (not separately reported)  Risks: n/a   Laymon CHRISTELLA Pinal, NP  Palliative Medicine Team Team phone # [phone]  Thank you for allowing the Palliative Medicine Team to assist in the care of this patient. Please utilize secure chat with additional questions, if there is no response within 30 minutes please call the above phone number.  Palliative Medicine Team providers are available by phone from 7am to 7pm daily and can be reached through the team cell phone.  Should this patient require assistance outside of these hours, please call the patient's attending physician.

## 2024-04-16 NOTE — Progress Notes (Signed)
   04/16/24 2210  Provider Notification  Provider Name/Title Erminio Cone NP  Date Provider Notified 04/16/24  Time Provider Notified 2218  Method of Notification Page  Notification Reason Change in status (soft BPs when sleeping (SBP 90s-low 100s, MAPs 50s), HR unchanged, mentation unchanged)  Provider response No new orders (Per Cone NP, likely his baseline, monitor for changes)  Date of Provider Response 04/16/24  Time of Provider Response 2218

## 2024-04-16 NOTE — Progress Notes (Signed)
 Progress Note   Patient: [name] FMW:[id number] DOB: 26-Jan-1936 DOA: 04/15/2024     1 DOS: the patient was seen and examined on 04/16/2024   Brief hospital admission narrative: [name] is a 88 y.o. male with medical history significant of hypertension, hyperlipidemia, dementia, prior history of stroke, prediabetes and chronic kidney disease stage IV; who presented to the emergency department from the skilled nursing facility secondary to worsening baseline mentation, decreased oral intake and being less interactive.   Patient with chronic sacral wound for which he was recently started on doxycycline ; also with not too long ago hospitalization secondary to left hip fracture.  Facility reported decreased oral intake and there were concern for dehydration.  There has not been any fever, chills, chest pain, nausea, vomiting, dysuria, hematuria, melena, hematochezia, shortness of breath or sick contacts.   Workup in the ED demonstrating a CT head without acute intracranial normalities; chest x-ray without acute cardiopulmonary process, urinalysis not demonstrating any signs of acute infection.  Blood work with significant hyponatremia, acute on chronic renal failure and significant metabolic acidosis.  Initially there was concern for potential sepsis and patient was called for with Rocephin  and ceftriaxone ; in the absence of source of infection antibiotic has been discontinue and efforts has been tailor to provide treatment of acute on chronic renal failure, dehydration and hyponatremia.   TRH has been consulted to place patient in the hospital for further evaluation and management.  Assessment and plan 1-acute metabolic encephalopathy - In the setting of hyponatremia and dehydration - Acute systemic infection process has been ruled out - Continue fluid resuscitation - Continue to follow electrolytes trend - Continue supportive care. -will continue constant  reorientation.  2-leukocytosis/metabolic and lactic acidosis - Appears to be in the setting of dehydration and a stress demargination - Will continue fluid resuscitation and will follow WBCs trend - Patient overall metabolic acidosis and lactic acidosis improving after fluid resuscitation. - No fever.  3-acute on chronic renal failure - Stage IV at baseline - Continue minimizing nephrotoxic agents - Avoid hypotension and the use of contrast - Will continue fluid resuscitation - Follow urine output.  4-hypertension - Will continue to hold Cozaar  in the setting of acute on chronic renal failure - Overall vital signs stable - Avoid hypotension - Continue to follow vital signs.  5-history of dementia - Advance - Will continue supportive care - Continue the use of Namenda  and Exelon  - Palliative care consultation has been requested for goals of care and advance care planning. - Will continue dysphagia 2 diet with thin liquids.  6-hyperlipidemia - Continue statin.  7-history of diabetes with nephropathy - No longer taking any medication - Last A1c 5.0 - Will continue to follow CBG fluctuation - Patient no eating much at the moment.  8-left great toe with incarnated nail and local paronychia - Some drainage appreciated and prior to admission cultures was taken and was positive for staph. - Oral doxycycline  and wound care will be provided - Outpatient follow-up with podiatrist recommended.  9-hyperkalemia - Treated with sodium bicarbonate and Lokelma - Continue fluid resuscitation and follow electrolyte - Potassium improved - Continue telemetry monitoring.  Subjective:  Chronically ill in appearance; no eating much and reporting no significant complaints.  Patient is able to take medications with applesauce and follow simple commands.  Physical Exam: Vitals:   04/16/24 1400 04/16/24 1500 04/16/24 1600 04/16/24 1700  BP: (!) 122/27 (!) 101/39 (!) 114/27 123/67  Pulse:  Resp: 16 13 16 13   Temp:      TempSrc:      SpO2: 100% 100% 100%   Weight:      Height:       General exam: Alert, awake, oriented x 1; following commands appropriately.  In no major distress.   Respiratory system: Good saturation on room air. Cardiovascular system:RRR.  No rubs, no gallops, no JVD. Gastrointestinal system: Abdomen is nondistended, soft and nontender. No organomegaly or masses felt. Normal bowel sounds heard. Central nervous system: Moving 4 limbs spontaneously.  No focal neurological deficits. Extremities: No cyanosis or clubbing. Skin: No petechiae; left great toe with changes suggesting paronychia following incarnated nail. Psychiatry: Judgement and insight appear impaired secondary to underlying dementia.   Data Reviewed: Basic metabolic panel: Sodium 151, potassium 4.9, chloride 121, bicarb 16, glucose 146, BUN 93, creatinine 2.15 and GFR 29 RAR:TARd 13.0, hemoglobin 9.1 and platelet count 361K  Family Communication: Daughter at bedside.  Disposition: Status is: Inpatient Remains inpatient appropriate because: Continue IV therapy.  Physical therapy has been requested to assess patient ability to participate in rehab; he came from a skilled nursing facility prior to this admission.  After discussing with family members they are open to discuss with palliative care and based on patient response to PT determine taking patient home with most likely home hospice.  Time spent: 50 minutes  Author: Eric Nunnery, MD 04/16/2024 6:54 PM  For on call review [email or web address].

## 2024-04-16 NOTE — TOC Initial Note (Signed)
 Transition of Care [facility]) - Initial/Assessment Note    Patient Details  Name: [name] MRN: [id number] Date of Birth: 12-30-1935  Transition of Care [facility]) CM/SW Contact:    Hoy DELENA Bigness, LCSW Phone Number: 04/16/2024, 10:01 AM  Clinical Narrative:                 Pt admitted from [facility] where he has been receiving STR since 04/03/24 following a hospitalization at [facility].  Pt only oriented x 1. Spoke with pt's spouse via t/c who shares that prior to pt going to STR he had been living with her and her daughter. Pt has a cane at home but, no other DME. Pt's spouse is hopeful pt will be able to return home at discharge with [facility] vs returning to SNF but, is agreeable for pt to return to [facility] if recommended by hospital PT.  CSW will continue to follow for disposition and discharge planning.  Expected Discharge Plan: Skilled Nursing Facility Barriers to Discharge: Continued Medical Work up   Patient Goals and CMS Choice Patient states their goals for this hospitalization and ongoing recovery are:: For pt to be able to return home or to SNF CMS Medicare.gov Compare Post Acute Care list provided to:: Patient Represenative (must comment) Choice offered to / list presented to : Spouse [place] ownership interest in [facility].provided to:: Spouse    Expected Discharge Plan and Services In-house Referral: Clinical Social Work Discharge Planning Services: NA Post Acute Care Choice: Home Health, Skilled Nursing Facility Living arrangements for the past 2 months: Single Family Home                                      Prior Living Arrangements/Services Living arrangements for the past 2 months: Single Family Home Lives with:: Spouse, Adult Children Patient language and need for interpreter reviewed:: Yes Do you feel safe going back to the place where you live?: Yes      Need for Family Participation in Patient Care: Yes (Comment) Care giver support system in place?: Yes  (comment) Current home services: DME [facility]) Criminal Activity/Legal Involvement Pertinent to Current Situation/Hospitalization: No - Comment as needed  Activities of Daily Living   ADL Screening (condition at time of admission) Independently performs ADLs?: No Does the patient have a NEW difficulty with bathing/dressing/toileting/self-feeding that is expected to last >3 days?: No Does the patient have a NEW difficulty with getting in/out of bed, walking, or climbing stairs that is expected to last >3 days?: No Does the patient have a NEW difficulty with communication that is expected to last >3 days?: No Is the patient deaf or have difficulty hearing?: Yes Does the patient have difficulty seeing, even when wearing glasses/contacts?: Yes Does the patient have difficulty concentrating, remembering, or making decisions?: Yes  Permission Sought/Granted Permission sought to share information with : Facility Medical sales representative, Family Supports Permission granted to share information with : Yes, Verbal Permission Granted  Share Information with NAME: Desrosier,[name] (Spouse)  [phone]  Permission granted to share info w AGENCY: SNF's, HHA's, and DME agencies        Emotional Assessment Appearance:: Appears stated age Attitude/Demeanor/Rapport: Unable to Assess Affect (typically observed): Unable to Assess Orientation: : Oriented to Self Alcohol / Substance Use: Not Applicable Psych Involvement: No (comment)  Admission diagnosis:  Acidosis [E87.20] Hyperkalemia [E87.5] Hypernatremia [E87.0] Elevated troponin [R79.89] Altered mental status, unspecified altered  mental status type [R41.82] Sepsis, due to unspecified organism, unspecified whether acute organ dysfunction present [facility]) [A41.9] Patient Active Problem List   Diagnosis Date Noted   Hypernatremia 04/15/2024   Chronic constipation 04/09/2024   Aortic atherosclerosis 04/09/2024   Acute postoperative anemia due to expected  blood loss 04/08/2024   Unspecified protein-calorie malnutrition 04/08/2024   Abnormal electrocardiogram (ECG) (EKG) 04/08/2024   Intertrochanteric fracture of left femur (HCC) 03/30/2024   Open wound of left great toe 03/30/2024   Asthma    COPD (chronic obstructive pulmonary disease) (HCC)    Mixed hyperlipidemia 11/27/2018   Dementia without behavioral disturbance (HCC)    Late effect of cerebrovascular accident (CVA)    Diabetes mellitus (HCC)    Essential hypertension    Hx-TIA (transient ischemic attack)    PCP:  Zollie Lowers, MD Pharmacy:   [facility] - [place], [place] - [address] [place] [address] Phone: [phone] Fax: [phone]  [facility] [place], [place] - [address] [place] [address] Phone: [phone] Fax: [phone]     Social Drivers of Health (SDOH) Social History: SDOH Screenings   Food Insecurity: Patient Unable To Answer (04/15/2024)  Housing: Patient Unable To Answer (04/15/2024)  Transportation Needs: Patient Unable To Answer (04/15/2024)  Utilities: Patient Unable To Answer (04/15/2024)  Alcohol Screen: Low Risk  (01/03/2023)  Depression (PHQ2-9): Low Risk  (03/17/2024)  Financial Resource Strain: Low Risk  (01/03/2023)  Physical Activity: Insufficiently Active (01/03/2023)  Social Connections: Patient Unable To Answer (04/15/2024)  Stress: No Stress Concern Present (01/03/2023)  Tobacco Use: Low Risk  (04/15/2024)   SDOH Interventions:     Readmission Risk Interventions    04/16/2024    9:58 AM  Readmission Risk Prevention Plan  Transportation Screening Complete  PCP or Specialist Appt within 3-5 Days Complete  HRI or Home Care Consult Complete  Social Work Consult for Recovery Care Planning/Counseling Complete  Palliative Care Screening Not Applicable  Medication Review [employer]) Complete

## 2024-04-16 NOTE — Plan of Care (Signed)
   Problem: Nutrition: Goal: Adequate nutrition will be maintained Outcome: Not Progressing   Problem: Pain Managment: Goal: General experience of comfort will improve and/or be controlled Outcome: Progressing

## 2024-04-17 ENCOUNTER — Telehealth: Payer: Self-pay | Admitting: Orthopedic Surgery

## 2024-04-17 DIAGNOSIS — E870 Hyperosmolality and hypernatremia: Secondary | ICD-10-CM | POA: Diagnosis not present

## 2024-04-17 DIAGNOSIS — R4182 Altered mental status, unspecified: Secondary | ICD-10-CM | POA: Diagnosis not present

## 2024-04-17 DIAGNOSIS — E872 Acidosis, unspecified: Secondary | ICD-10-CM | POA: Diagnosis not present

## 2024-04-17 DIAGNOSIS — R7989 Other specified abnormal findings of blood chemistry: Secondary | ICD-10-CM | POA: Diagnosis not present

## 2024-04-17 LAB — BASIC METABOLIC PANEL WITH GFR
Anion gap: 13 (ref 5–15)
BUN: 71 mg/dL — ABNORMAL HIGH (ref 8–23)
CO2: 17 mmol/L — ABNORMAL LOW (ref 22–32)
Calcium: 8.4 mg/dL — ABNORMAL LOW (ref 8.9–10.3)
Chloride: 118 mmol/L — ABNORMAL HIGH (ref 98–111)
Creatinine, Ser: 1.72 mg/dL — ABNORMAL HIGH (ref 0.61–1.24)
GFR, Estimated: 38 mL/min — ABNORMAL LOW (ref >60)
Glucose, Bld: 126 mg/dL — ABNORMAL HIGH (ref 70–99)
Potassium: 4.3 mmol/L (ref 3.5–5.1)
Sodium: 148 mmol/L — ABNORMAL HIGH (ref 135–145)

## 2024-04-17 LAB — GLUCOSE, CAPILLARY
Glucose-Capillary: 113 mg/dL — ABNORMAL HIGH (ref 70–99)
Glucose-Capillary: 125 mg/dL — ABNORMAL HIGH (ref 70–99)
Glucose-Capillary: 112 mg/dL — ABNORMAL HIGH (ref 70–99)

## 2024-04-17 MED ORDER — ENSURE PLUS HIGH PROTEIN PO LIQD
237.0000 mL | Freq: Two times a day (BID) | ORAL | Status: DC
  Administered 2024-04-18 – 2024-04-20 (×4): 237 mL via ORAL

## 2024-04-17 NOTE — Progress Notes (Signed)
 Speech Language Pathology Treatment: Dysphagia  Patient Details Name: [name] MRN: [id number] DOB: July 03, 1936 Today's Date: 04/17/2024 Time: 1125-1140 SLP Time Calculation (min) (ACUTE ONLY): 15 min  Assessment / Plan / Recommendation Clinical Impression  Pt seen for dysphagia f/u tx session with limited food/liquids administered.  Pt consumed thin via straw with mod verbal/tactile cues provided by SLP for bite/sip size and straw removal during thin liquid consumption.  Pt consumed puree with impulsivity noted with bite size and redirection/mod verbal cues needed.  Pt did not exhibit any overt s/s of aspiration, but slight oral retention/delayed swallow noted throughout trial d/t cognitive-based dysphagia.  Continue current diet of Dysphagia 2(minced)/thin liquids with FULL supervision/mod verbal/tactile cues provided during meals/snacks.  Recommend f/u with [facility] SLP for dysphagia tx.  ST will s/o in acute setting.  Thank you for allowing us  to care for this patient in acute care.  HPI HPI: Patient is an 88 year old male who presents emergency department from his long-term care facility secondary to increased weakness, fatigue, altered mental status and hypotension. It is noted by this in a facility that he does have a known left femur fracture as well as a known bedsore over the sacral region. Patient has had no associated fever or chills. He does have a history of dementia and limited history is obtained. Patient does appear to be in pain on presentation. ST consulted for swallow evaluation d/t nursing staff in ED stating pt exhibited dysphagia. ST f/u for dysphagia tx.      SLP Plan  Continue with current plan of care          Recommendations  Diet recommendations: Dysphagia 1 (puree);Thin liquid Liquids provided via: Teaspoon;Cup Medication Administration: Crushed with puree Supervision: Full supervision/cueing for compensatory strategies;Trained caregiver to feed  patient Compensations: Slow rate;Small sips/bites Postural Changes and/or Swallow Maneuvers: Seated upright 90 degrees                  Oral care BID;Staff/trained caregiver to provide oral care   Frequent or constant Supervision/Assistance Dysphagia, unspecified (R13.10)     Continue with current plan of care     Pat Suzi Hernan,M.S.,CCC-SLP  04/17/2024, 11:30 AM

## 2024-04-17 NOTE — Progress Notes (Signed)
 Progress Note   Patient: [name] FMW:[id number] DOB: 21-Jul-1935 DOA: 04/15/2024     2 DOS: the patient was seen and examined on 04/17/2024   Brief hospital admission narrative: [name] is a 88 y.o. male with medical history significant of hypertension, hyperlipidemia, dementia, prior history of stroke, prediabetes and chronic kidney disease stage IV; who presented to the emergency department from the skilled nursing facility secondary to worsening baseline mentation, decreased oral intake and being less interactive.   Patient with chronic sacral wound for which he was recently started on doxycycline ; also with not too long ago hospitalization secondary to left hip fracture.  Facility reported decreased oral intake and there were concern for dehydration.  There has not been any fever, chills, chest pain, nausea, vomiting, dysuria, hematuria, melena, hematochezia, shortness of breath or sick contacts.   Workup in the ED demonstrating a CT head without acute intracranial normalities; chest x-ray without acute cardiopulmonary process, urinalysis not demonstrating any signs of acute infection.  Blood work with significant hyponatremia, acute on chronic renal failure and significant metabolic acidosis.  Initially there was concern for potential sepsis and patient was called for with Rocephin  and ceftriaxone ; in the absence of source of infection antibiotic has been discontinue and efforts has been tailor to provide treatment of acute on chronic renal failure, dehydration and hyponatremia.   TRH has been consulted to place patient in the hospital for further evaluation and management.  Assessment and plan 1-acute metabolic encephalopathy - In the setting of hypernatremia and dehydration. - Acute systemic infection process has been ruled out. - Continue fluid resuscitation and follow electrolyte trend. - Continue supportive care -Continue constant reorientation and follow clinical  response.. -on today's exam more interactive and following commands appropriately.  2-leukocytosis/metabolic and lactic acidosis - Appears to be in the setting of dehydration and stress demargination - Will continue fluid resuscitation and will follow WBCs trend. Patient is afebrile. - Patient overall metabolic acidosis and lactic acidosis has continue improving with fluid resuscitation. - follow clinical response.  3-acute on chronic renal failure - Stage IV at baseline - Continue minimizing nephrotoxic agents - Avoid hypotension and the use of contrast - Will continue fluid resuscitation - Follow urine output.  4-hypertension - Will continue to hold Cozaar  in the setting of acute on chronic renal failure - Overall vital signs stable - Avoid hypotension - Continue to follow vital signs.  5-history of dementia - Advance - Will continue supportive care - Continue the use of Namenda  and Exelon  - Palliative care consultation has been requested for goals of care and advance care planning. - Will continue dysphagia 2 diet with thin liquids.  6-hyperlipidemia - Continue statin.  7-history of diabetes with nephropathy - No longer taking any medication - Last A1c 5.0 - Will continue to follow CBG fluctuation - Patient no eating much at the moment.  8-left great toe with incarnated nail and local paronychia - Some drainage appreciated and prior to admission cultures was taken and was positive for staph. - Oral doxycycline  and wound care will be continued. - Outpatient follow-up with podiatrist recommended.  9-hyperkalemia - Treated with sodium bicarbonate and Lokelma - Continue fluid resuscitation and follow electrolyte - Potassium improved - Continue telemetry monitoring.  10-pressure injury -stage 1 sacral and stage 2 left buttock wound -POA -no superimposed infection -continue local care and constant repositioning.  Subjective:  Afebrile, no overnight events. Patient  is eating/drinking more today; also more interactive and following commands appropriately.  Physical Exam: Vitals:   04/17/24 0730 04/17/24 0900 04/17/24 0958 04/17/24 1108  BP: 128/82 (!) 87/52 (!) 124/53 (!) 137/95  Pulse:      Resp: (!) 24  17 14   Temp:      TempSrc:      SpO2:    100%  Weight:      Height:       General exam: Alert, awake, in not acute distress. Respiratory system: good saturation on RA. Respiratory effort normal. Cardiovascular system:RRR. No rubs or gallops. Gastrointestinal system: Abdomen is nondistended, soft and nontender. No organomegaly or masses felt. Normal bowel sounds heard. Central nervous system: No focal neurological deficits. Extremities: No cyanosis or clubbing, left lateral hip wound clean and with clean dressing; intact staples. Skin: No petechiae, left great toe with clean dressing in place. Stage 1 sacral wound and stage 2 left buttock pressure injury; both present at time of admission and without superimposed infection. Psychiatry: Judgement and insight appear normal impaired from dementia.stable mood.  Data Reviewed: Basic metabolic panel: Sodium 148, potassium 4.3, chloride 118, bicarb 17, glucose 126, BUN 71, creatinine 1.72 and GFR 38 RAR:TARd 13.0, hemoglobin 9.1 and platelet count 361K  Family Communication: Daughter at bedside.  Disposition: Status is: Inpatient Remains inpatient appropriate because: Continue IV therapy.  After discussion with palliative care plan is for discharge home with [facility] services and outpatient palliative care.  Time spent: 50 minutes  Author: Eric Nunnery, MD 04/17/2024 5:23 PM  For on call review [email or web address].

## 2024-04-17 NOTE — Plan of Care (Signed)

## 2024-04-17 NOTE — Evaluation (Signed)
 Physical Therapy Evaluation Patient Details Name: [name] MRN: [id number] DOB: Jun 09, 1936 Today's Date: 04/17/2024  History of Present Illness  [name] is a 88 y.o. male with medical history significant of hypertension, hyperlipidemia, dementia, prior history of stroke, prediabetes and chronic kidney disease stage IV; who presented to the emergency department from the skilled nursing facility secondary to worsening baseline mentation, decreased oral intake and being less interactive.     Patient with chronic sacral wound for which he was recently started on doxycycline ; also with not too long ago hospitalization secondary to left hip fracture.  Facility reported decreased oral intake and there were concern for dehydration.  There has not been any fever, chills, chest pain, nausea, vomiting, dysuria, hematuria, melena, hematochezia, shortness of breath or sick contacts.     Workup in the ED demonstrating a CT head without acute intracranial normalities; chest x-ray without acute cardiopulmonary process, urinalysis not demonstrating any signs of acute infection.  Blood work with significant hyponatremia, acute on chronic renal failure and significant metabolic acidosis.  Initially there was concern for potential sepsis and patient was called for with Rocephin  and ceftriaxone ; in the absence of source of infection antibiotic has been discontinue and efforts has been tailor to provide treatment of acute on chronic renal failure, dehydration and hyponatremia.   Clinical Impression  Patient agreeable to PT evaluation. Family present during evaluation and contributes to subjective history taking due to pt AMS. Reports patient's recent baseline prior to hospitalizations as modified independent with SPC, and requiring assist for ADL. Reports patient had not received much therapy before having to come to hospital. On this date, patient is max assist for all bed mobility, and functional tranfers with RW.  Patient is limited due to pain, decreased strength, decreased ROM, and fatigue. Patient tolerates sitting in recliner at end of session, call button within reach, family present as well as SLP. Would recommend return to SNF but family reports being about to provide 24/7 assist for patient at home and can provide the available amount of assist with no issue. If family chooses to return home, would recommend RW, and BSC for patient maximum safety. Educated family on HHPT services only coming to home 2-3x a week and the importance of OOB activity for pt recovery. Family reports understanding. Patient will benefit from continued skilled physical therapy acutely and in recommended venue in order to address current deficits to improve current function and quality of life.         If plan is discharge home, recommend the following: Two people to help with walking and/or transfers;Two people to help with bathing/dressing/bathroom;Assistance with cooking/housework;Direct supervision/assist for medications management;Direct supervision/assist for financial management;Assist for transportation;Help with stairs or ramp for entrance;Supervision due to cognitive status   Can travel by private vehicle   No    Equipment Recommendations None recommended by PT  Recommendations for Other Services       Functional Status Assessment Patient has had a recent decline in their functional status and demonstrates the ability to make significant improvements in function in a reasonable and predictable amount of time.     Precautions / Restrictions Precautions Precautions: Fall Recall of Precautions/Restrictions: Impaired Restrictions Weight Bearing Restrictions Per Provider Order: Yes LLE Weight Bearing Per Provider Order: Weight bearing as tolerated Other Position/Activity Restrictions: WBAT LLE      Mobility  Bed Mobility Overal bed mobility: Needs Assistance Bed Mobility: Supine to Sit     Supine to sit:  Max assist,  Used rails     General bed mobility comments: HOB flat. Max assist needed for LLE handling. Pt able to use bed railing to assist trunk in opposite direction. Max assisted needed for trunk elevation. inc time with very slow movements for pt comfort.    Transfers Overall transfer level: Needs assistance Equipment used: Rolling walker (2 wheels) Transfers: Sit to/from Stand, Bed to chair/wheelchair/BSC Sit to Stand: Max assist   Step pivot transfers: Max assist       General transfer comment: Max assist to stand with RW use due to pain and weakness, limited WB in LLE, max assist during transfer with total assist for managing RW, inc time needed for processing verbal cues. continuous verbal cues for standing tall to improve hip flexion and weight bearing through UEs    Ambulation/Gait       General Gait Details: Unable to safely assess this date  Stairs            Wheelchair Mobility     Tilt Bed    Modified Rankin (Stroke Patients Only)       Balance Overall balance assessment: Needs assistance Sitting-balance support: Feet supported, No upper extremity supported, Bilateral upper extremity supported Sitting balance-Leahy Scale: Poor Sitting balance - Comments: Seated EOB, pt sometimes requiring assist due to posterior lean and lateral lean Postural control: Posterior lean, Right lateral lean, Left lateral lean Standing balance support: Reliant on assistive device for balance, During functional activity, Bilateral upper extremity supported Standing balance-Leahy Scale: Poor Standing balance comment: w/ RW and max assist           Pertinent Vitals/Pain Pain Assessment Pain Assessment: Faces Faces Pain Scale: Hurts even more Pain Location: pt reporting hurting all over prior to mobility, grimacing and screaming with mobility Pain Descriptors / Indicators: Grimacing, Moaning Pain Intervention(s): Limited activity within patient's tolerance,  Repositioned, Monitored during session    Home Living Family/patient expects to be discharged to:: Private residence Living Arrangements: Children;Spouse/significant other Available Help at Discharge: Family;Available 24 hours/day Type of Home: House Home Access: Stairs to enter Entrance Stairs-Rails: Right;Left;Can reach both Entrance Stairs-Number of Steps: 2   Home Layout: One level Home Equipment: Shower seat;Grab bars - tub/shower;Cane - single point Additional Comments: Pt admitted from SNF. Family wants to bring pt home. family confirms the above information from previous admission.    Prior Function Prior Level of Function : Needs assist       Physical Assist : ADLs (physical)   ADLs (physical): Bathing;Dressing;IADLs Mobility Comments: Family reports prior to recent hospitalizations, pt was at least modified independent with mobility, using SPC more over last few months ADLs Comments: Reports assisted with ADLs.     Extremity/Trunk Assessment   Upper Extremity Assessment Upper Extremity Assessment: Generalized weakness;Overall [facility] for tasks assessed    Lower Extremity Assessment Lower Extremity Assessment: LLE deficits/detail LLE Deficits / Details: pain with weight bearing, limited weight bearing in LLE. Anti gravity movements in bed difficult. LLE: Unable to fully assess due to pain LLE Coordination: decreased gross motor    Cervical / Trunk Assessment Cervical / Trunk Assessment: Kyphotic  Communication   Communication Communication: No apparent difficulties    Cognition Arousal: Lethargic Behavior During Therapy: WFL for tasks assessed/performed, Flat affect   PT - Cognitive impairments: History of cognitive impairments, Orientation, Awareness, Memory, Attention, Initiation, Sequencing, Problem solving, Safety/Judgement     PT - Cognition Comments: Alert to self only Following commands: Impaired Following commands impaired: Follows one step commands  with increased time     Cueing Cueing Techniques: Verbal cues, Tactile cues, Visual cues, Gestural cues     General Comments      Exercises     Assessment/Plan    PT Assessment Patient needs continued PT services;All further PT needs can be met in the next venue of care  PT Problem List Decreased strength;Decreased range of motion;Decreased activity tolerance;Decreased balance;Decreased mobility;Decreased knowledge of use of DME;Decreased safety awareness;Decreased knowledge of precautions;Pain       PT Treatment Interventions DME instruction;Stair training;Gait training;Functional mobility training;Therapeutic activities;Therapeutic exercise;Balance training;Patient/family education    PT Goals (Current goals can be found in the Care Plan section)  Acute Rehab PT Goals Patient Stated Goal: Return home PT Goal Formulation: With patient/family Time For Goal Achievement: 05/01/24 Potential to Achieve Goals: Good    Frequency Min 3X/week     Co-evaluation               AM-PAC PT 6 Clicks Mobility  Outcome Measure Help needed turning from your back to your side while in a flat bed without using bedrails?: A Lot Help needed moving from lying on your back to sitting on the side of a flat bed without using bedrails?: A Lot Help needed moving to and from a bed to a chair (including a wheelchair)?: A Lot Help needed standing up from a chair using your arms (e.g., wheelchair or bedside chair)?: A Lot Help needed to walk in hospital room?: A Lot Help needed climbing 3-5 steps with a railing? : Total 6 Click Score: 11    End of Session Equipment Utilized During Treatment: Gait belt Activity Tolerance: Patient limited by pain;Patient limited by fatigue Patient left: in chair;with call bell/phone within reach;with family/visitor present Nurse Communication: Mobility status PT Visit Diagnosis: Unsteadiness on feet (R26.81);Pain;Muscle weakness (generalized) (M62.81);Other  abnormalities of gait and mobility (R26.89);Difficulty in walking, not elsewhere classified (R26.2) Pain - Right/Left: Left Pain - part of body: Hip    Time: 8894-8855 PT Time Calculation (min) (ACUTE ONLY): 39 min   Charges:   PT Evaluation $PT Eval Moderate Complexity: 1 Mod   PT General Charges $$ ACUTE PT VISIT: 1 Visit         2:33 PM, 04/17/24 Nathania Waldman Powell-Butler, PT, DPT [place] with [facility]

## 2024-04-17 NOTE — Progress Notes (Signed)
 [facility] Liaison Note:  Notified by [facility] manager of family request for [facility] Palliative services at home after discharge.   Please call with any hospice or outpatient palliative care related questions.   Thank you for the opportunity to participate in this patient's care.  Greig Basket, BSN, RN [facility] Liaison [phone]

## 2024-04-17 NOTE — Plan of Care (Signed)
  Problem: Acute Rehab PT Goals(only PT should resolve) Goal: Pt Will Go Supine/Side To Sit Outcome: Progressing Flowsheets (Taken 04/17/2024 1435) Pt will go Supine/Side to Sit: with moderate assist Goal: Patient Will Perform Sitting Balance Outcome: Progressing Flowsheets (Taken 04/17/2024 1435) Patient will perform sitting balance: with contact guard assist Goal: Patient Will Transfer Sit To/From Stand Outcome: Progressing Flowsheets (Taken 04/17/2024 1435) Patient will transfer sit to/from stand: with moderate assist Goal: Pt Will Transfer Bed To Chair/Chair To Bed Outcome: Progressing Flowsheets (Taken 04/17/2024 1435) Pt will Transfer Bed to Chair/Chair to Bed: with mod assist Goal: Pt Will Ambulate Outcome: Progressing Flowsheets (Taken 04/17/2024 1435) Pt will Ambulate:  10 feet  with rolling walker  with moderate assist    2:36 PM, 04/17/24 Sylvia Helms Powell-Butler, PT, DPT  with [facility]

## 2024-04-17 NOTE — Telephone Encounter (Signed)
 Pt's wife called wanting to know when her husbands staples need to come out. Pt had appt on 10/8, but had to cancel due to sickness. He has another one scheduled for 11/6. Pt call back number is [phone]

## 2024-04-17 NOTE — TOC Progression Note (Signed)
 Transition of Care [facility]) - Progression Note    Patient Details  Name: [name] MRN: [id number] Date of Birth: Mar 13, 1936  Transition of [facility] CM/SW Contact  Hoy DELENA Bigness, LCSW Phone Number: 04/17/2024, 3:13 PM  Clinical Narrative:    Met with pt's family at bedside (daughter, sister, brother-in-law) and spoke with pt's spouse via t/c. Family in agreeance to plan for pt to return home with outpatient palliative care, home health PT,OT,SLP, and DME (hospital bed and wheelchair). Pt's family would like palliative care services with [facility] and have no agency preference for [facility].  Referral made to Amy Guerette with [facility] for palliative care services.  HHPT/OT/SLP arranged with [facility]. HH order will need to be placed prior to discharge.  Hospital bed and wheelchair will be ordered through [facility] once DME orders are placed.   Expected Discharge Plan: Skilled Nursing Facility Barriers to Discharge: Continued Medical Work up               Expected Discharge Plan and Services In-house Referral: Clinical Social Work Discharge Planning Services: NA Post Acute Care Choice: Home Health, Skilled Nursing Facility Living arrangements for the past 2 months: Single Family Home                           HH Arranged: PT, OT, Speech Therapy HH Agency: [facility] Date [facility] Agency Contacted: 04/17/24 Time HH Agency Contacted: 1438 Representative spoke with at [facility] Agency: Joane   Social Drivers of Health (SDOH) Interventions SDOH Screenings   Food Insecurity: Patient Unable To Answer (04/15/2024)  Housing: Patient Unable To Answer (04/15/2024)  Transportation Needs: Patient Unable To Answer (04/15/2024)  Utilities: Patient Unable To Answer (04/15/2024)  Alcohol Screen: Low Risk  (01/03/2023)  Depression (PHQ2-9): Low Risk  (03/17/2024)  Financial Resource Strain: Low Risk  (01/03/2023)  Physical Activity: Insufficiently Active (01/03/2023)  Social  Connections: Patient Unable To Answer (04/15/2024)  Stress: No Stress Concern Present (01/03/2023)  Tobacco Use: Low Risk  (04/15/2024)    Readmission Risk Interventions    04/16/2024    9:58 AM  Readmission Risk Prevention Plan  Transportation Screening Complete  PCP or Specialist Appt within 3-5 Days Complete  HRI or Home Care Consult Complete  Social Work Consult for Recovery Care Planning/Counseling Complete  Palliative Care Screening Not Applicable  Medication Review Oceanographer) Complete

## 2024-04-17 NOTE — Progress Notes (Addendum)
 Daily Progress Note   [name]       Date: 04/17/2024 DOB: [birth year]  Age: 88 y.o. MRN#: [id number] Attending Physician: Ricky Fines, MD Primary Care Physician: Zollie Lowers, MD Admit Date: 04/15/2024  Reason for Consultation/Follow-up: Establishing goals of care  Subjective:  88 y.o. male  with past medical history of dementia, hypertension, hyperlipidemia, history of TIA, CVA, diabetes, COPD, CKD stage IV, and anxiety admitted on 04/15/2024 with worsening mentation and decreased oral intake.    Workup revealed significant hypernatremia, acute on chronic renal failure, and metabolic acidosis concerning for severe dehydration.  White blood cell count and lactic acid initially elevated and therefore received IV antibiotics however, no source of infection localized.  Findings likely secondary to hemoconcentration and metabolic acidosis from severe volume depletion.  Initial completed on 04/16/2024: Goals of care and advance directives discussed.  Updated CODE STATUS from full code to DNR/DNI.  Goal is for patient to improve enough that family can bring him home.  Discussed disposition options and likely increased need for support once home.  Discussed hospice versus palliative care.  Would like time to consider and request that I come back tomorrow to discuss with other family numbers.  Today, labs independently reviewed.Sodium level improving with IV fluids (10/10-148<<151<<152<<158<<153- 10/8).  Chloride level also gradually improving.  CO2 level remaining in the 16-17 range.  Mildly elevated blood sugar level.  Renal function gradually improving (creatinine: 10/10: 1.72<<2.15<<2.29<<2.87<<2.82).  Mild hypocalcemia noted.  Normal albumin level earlier this admission.  Would recommend continue to trend.  Vital signs reviewed.   Blood pressure levels have been variable.  With some normotensive readings, some hypertensive reading, and the occasional hypotensive reading.  Heart rate stable.  O2 sats within normal limits on room air.  Medication administration record reviewed continues on scheduled Seroquel  nightly at bedtime and memory support agents.  Did receive 2 doses of Tylenol  total of 1300 mg on 24-hour look back.  Otherwise, no as needed symptom meds administered.  Independent history obtained from nursing staff and family as patient is a poor historian related to his advanced dementia.  Per nursing, patient appears to be stabilizing.  He is more alert.  The plan is to transition him to the floor from the ICU if he remains medically stable later on today.  Today, patient is much more alert.  He is noted to be crying but cannot say why.  He denies any pain.  When asked he does say he is nervous but he cannot say why.  Per family, he has eaten much better over the past 24 hours and  they noticed he has been more alert and interactive.  Speak with patient's wife and 3 daughters in family room.  Given 2 daughters were not present for previous discussion, with permission from wife I reiterate our discussion from yesterday.  Discussed dementia disease trajectory and how acute illnesses, especially hip fracture, can impact this trajectory and accelerate in individuals decline.  Discussed findings we are seeing currently that cause us  worry that he is in the end stages of dementia (declining oral intake, declining functional status, increased lethargy, worsening confusion, recurrent hospitalizations).  We also discussed that given time is getting shorter many individuals focus on comfort and quality of life at this point.  Family confirms that this is their desire for patient at this time.  They have a strong faith and will continue to pray that he gets better but they are also realistic that no one gets to live forever.  We discussed  plan to take patient home.  Provided education that he and his family will likely need increased support at home as he certainly will need a lot of help now and anticipate further declines in his status related to dementia progression.  Provided extensive education regarding both hospice and Palliative Care services as an outpatient.  Discussed both the similarities and differences in hospice and palliative philosophy, levels of service/support provided, and benefits of each.  We discussed what care would look like under hospice versus outpatient palliative with home health PT/OT.  Patient's wife states that at this point she would like to bring him home with outpatient palliative follow-up and with home health PT/OT.  She states that when the time comes that he needs more assistance she will transition to hospice in the home.  Patient's wife states that he will also need a hospital bed but needs to reach out to the insurance company to pursue this.  She is agreeable to inpatient PT/OT to help with determining disposition needs.  Confirmed DNR/DNI status. Reviewed the MOST form in detail with the wife, including how its components reflect his care preferences. Documentation was finalized and scanned into the electronic medical record.   I completed a MOST form today. The patient and family outlined their wishes for the following treatment decisions:  Cardiopulmonary Resuscitation: Do Not Attempt Resuscitation (DNR/No CPR)  Medical Interventions: Limited Additional Interventions: Use medical treatment, IV fluids and cardiac monitoring as indicated, DO NOT USE intubation or mechanical ventilation. May consider use of less invasive airway support such as BiPAP or CPAP. Also provide comfort measures. Transfer to the hospital if indicated. Avoid intensive care.   Antibiotics: Determine use of limitation of antibiotics when infection occurs  IV Fluids: IV fluids for a defined trial period  Feeding Tube: No  feeding tube     Questions and concerns were addressed.  The family was encouraged to call with questions or concerns.  PMT will continue to support holistically.  Chart review/care coordination:  Completed extensive chart review including EPIC notes, labs (independently reviewed), MAR, vital signs. Coordinated care with attending physician, bedside nursing staff, and TOC.   Length of Stay: 2   Physical Exam Constitutional:      Appearance: He is not toxic-appearing.  Pulmonary:     Effort: Pulmonary effort is normal. No respiratory distress.  Skin:    General: Skin is warm and dry.  Neurological:     Mental Status: He is confused.  Psychiatric:        Mood and Affect: Mood is anxious.  Comments: Crying and anxious but more alert and responsive than yesterday             Vital Signs: BP (!) 181/86   Pulse 74   Temp 98.5 F (36.9 C) (Axillary)   Resp (!) 23   Ht 5' 9 (1.753 m)   Wt 60.3 kg   SpO2 100%   BMI 19.63 kg/m  SpO2: SpO2: 100 % O2 Device: O2 Device: Room Air O2 Flow Rate:        Palliative Assessment/Data: Currently: 40%   Palliative Care Assessment & Plan   Patient Profile/Assessment:  88 year old male with dementia with recent hip fracture admitted for severe dehydration.  Appears to have progressive/end-stage dementia.  Initial goals of care and advance care planning meeting completed on 04/16/2024.  Patient was transition from full code to DNR/DNI.  Continue to treat the treatable.  Family is hopeful that he will improve and they will be able to bring him home.  Discussed disposition support options including outpatient palliative versus hospice.  Patient's wife and daughter would like further discussion with family prior to making this decision.  Follow-up meeting planned on 04/17/2024.  This nurse practitioner met with patient's family again on 04/17/2024.  Patient more alert.  Plan is for him to return home with family with outpatient palliative  support and home PT/OT.  He will need a hospital bed per wife.  TOC aware.  PT to evaluate and treat while inpatient.  Symptoms stable on current regimen.  Palliative team will continue to support for ongoing goals of care discussion, symptom management, and disposition discussions.   Recommendations/Plan:  DNR/DNI Treat the treatable PT evaluation and treatment Continue current symptom regimen as it appears effective Plan to discharge home with outpatient palliative and likely home PT/OT support While inpatient, palliative medicine team will continue to follow for ongoing goals of care discussion, symptom management, and coordination of care.   Symptom management:  Symptoms relatively stable at present, therefore continue symptom regimen per admitting team with PMT available as needed for support   Prognosis:  < 6 months    Discharge Planning: Home with Palliative Services    Detailed review of medical records (labs, imaging, vital signs), medically appropriate exam, discussed with treatment team, counseling and education to patient, family, & staff, documenting clinical information, medication management, coordination of care   Total time: I spent 55 minutes in the care of the patient today in the above activities and documenting the encounter.   Laymon CHRISTELLA Pinal, NP  Palliative Medicine Team Team phone # [phone]  Thank you for allowing the Palliative Medicine Team to assist in the care of this patient. Please utilize secure chat with additional questions, if there is no response within 30 minutes please call the above phone number.  Palliative Medicine Team providers are available by phone from 7am to 7pm daily and can be reached through the team cell phone.  Should this patient require assistance outside of these hours, please call the patient's attending physician.

## 2024-04-18 DIAGNOSIS — Z66 Do not resuscitate: Secondary | ICD-10-CM

## 2024-04-18 DIAGNOSIS — E870 Hyperosmolality and hypernatremia: Secondary | ICD-10-CM | POA: Diagnosis not present

## 2024-04-18 DIAGNOSIS — Z558 Other problems related to education and literacy: Secondary | ICD-10-CM | POA: Diagnosis not present

## 2024-04-18 DIAGNOSIS — Z515 Encounter for palliative care: Secondary | ICD-10-CM

## 2024-04-18 DIAGNOSIS — Z7189 Other specified counseling: Secondary | ICD-10-CM

## 2024-04-18 DIAGNOSIS — Z789 Other specified health status: Secondary | ICD-10-CM

## 2024-04-18 LAB — GLUCOSE, CAPILLARY
Glucose-Capillary: 106 mg/dL — ABNORMAL HIGH (ref 70–99)
Glucose-Capillary: 114 mg/dL — ABNORMAL HIGH (ref 70–99)
Glucose-Capillary: 121 mg/dL — ABNORMAL HIGH (ref 70–99)
Glucose-Capillary: 124 mg/dL — ABNORMAL HIGH (ref 70–99)
Glucose-Capillary: 125 mg/dL — ABNORMAL HIGH (ref 70–99)
Glucose-Capillary: 57 mg/dL — ABNORMAL LOW (ref 70–99)

## 2024-04-18 LAB — BASIC METABOLIC PANEL WITH GFR
Anion gap: 11 (ref 5–15)
BUN: 51 mg/dL — ABNORMAL HIGH (ref 8–23)
CO2: 18 mmol/L — ABNORMAL LOW (ref 22–32)
Calcium: 8.6 mg/dL — ABNORMAL LOW (ref 8.9–10.3)
Chloride: 118 mmol/L — ABNORMAL HIGH (ref 98–111)
Creatinine, Ser: 1.47 mg/dL — ABNORMAL HIGH (ref 0.61–1.24)
GFR, Estimated: 46 mL/min — ABNORMAL LOW (ref >60)
Glucose, Bld: 112 mg/dL — ABNORMAL HIGH (ref 70–99)
Potassium: 4.5 mmol/L (ref 3.5–5.1)
Sodium: 146 mmol/L — ABNORMAL HIGH (ref 135–145)

## 2024-04-18 MED ORDER — SODIUM CHLORIDE 0.45 % IV SOLN: INTRAVENOUS | Status: AC

## 2024-04-18 MED ORDER — DIPHENHYDRAMINE HCL 50 MG/ML IJ SOLN
12.5000 mg | Freq: Once | INTRAMUSCULAR | Status: AC
  Administered 2024-04-18: 12.5 mg via INTRAVENOUS
  Filled 2024-04-18: qty 1

## 2024-04-18 MED ORDER — DIPHENHYDRAMINE HCL 12.5 MG/5ML PO ELIX
12.5000 mg | ORAL_SOLUTION | Freq: Once | ORAL | Status: AC
  Administered 2024-04-18: 12.5 mg via ORAL
  Filled 2024-04-18: qty 5

## 2024-04-18 NOTE — TOC Progression Note (Signed)
 DME Note   Patient Details  Name: [name] MRN: [id number] Date of Birth: Jan 31, 1936  Transition of Care [facility]) CM/SW Contact  Sharlyne Stabs, RN Phone Number: 04/18/2024, 1:40 PM      Patient will discharge home, needs DME  Wheelchair note  Patient suffers from Dementia worsening mentation and decreased oral intake.  which impairs their ability to perform daily activities like ambulating in the home. A walker will not resolve issue with performing activities of daily living. A wheelchair will allow patient to safely perform daily activities. Patient can safely propel the wheelchair in the home or has a caregiver who can provide assistance.  Hospital bed Needed for  Dementia patient  with significant decline going home on palliative care.  Patient requires frequent re-positioning of the body in ways that cannot be achieved with  an ordinary bed or wedge pillow, to eliminate pain, reduce pressure, and the head of the  bed to be elevated more than 30 degrees most of the time due to  decreased appetite and lethargic and weakness.

## 2024-04-18 NOTE — Progress Notes (Signed)
 Progress Note   Patient: [name] FMW:[id number] DOB: [birth year] DOA: 04/15/2024     3 DOS: the patient was seen and examined on 04/18/2024   Brief hospital admission narrative: [name] is a 88 y.o. male with medical history significant of hypertension, hyperlipidemia, dementia, prior history of stroke, prediabetes and chronic kidney disease stage IV; who presented to the emergency department from the skilled nursing facility secondary to worsening baseline mentation, decreased oral intake and being less interactive.   Patient with chronic sacral wound for which he was recently started on doxycycline ; also with not too long ago hospitalization secondary to left hip fracture.  Facility reported decreased oral intake and there were concern for dehydration.  There has not been any fever, chills, chest pain, nausea, vomiting, dysuria, hematuria, melena, hematochezia, shortness of breath or sick contacts.   Workup in the ED demonstrating a CT head without acute intracranial normalities; chest x-ray without acute cardiopulmonary process, urinalysis not demonstrating any signs of acute infection.  Blood work with significant hyponatremia, acute on chronic renal failure and significant metabolic acidosis.  Initially there was concern for potential sepsis and patient was called for with Rocephin  and ceftriaxone ; in the absence of source of infection antibiotic has been discontinue and efforts has been tailor to provide treatment of acute on chronic renal failure, dehydration and hyponatremia.   TRH has been consulted to place patient in the hospital for further evaluation and management.  Assessment and plan 1-acute metabolic encephalopathy - In the setting of hypernatremia and dehydration. - Acute systemic infection process has been ruled out. - Continue fluid resuscitation and follow electrolyte trend. - Continue supportive care -Continue constant reorientation and follow clinical  response.. -on today's exam more interactive and following commands appropriately.  2-leukocytosis/metabolic and lactic acidosis - Appears to be in the setting of dehydration and stress demargination - Will continue fluid resuscitation and will follow WBCs trend. Patient is afebrile. - Patient overall metabolic acidosis and lactic acidosis has continue improving with fluid resuscitation. - follow clinical response.  3-acute on chronic renal failure - Stage IV at baseline - Continue minimizing nephrotoxic agents - Avoid hypotension and the use of contrast - Will continue fluid resuscitation - Follow urine output.  4-hypertension - Will continue to hold Cozaar  in the setting of acute on chronic renal failure - Overall vital signs stable - Avoid hypotension - Continue to follow vital signs.  5-history of dementia - Advance - Will continue supportive care - Continue the use of Namenda  and Exelon  - Palliative care consultation has been requested for goals of care and advance care planning. - Will continue dysphagia 2 diet with thin liquids.  6-hyperlipidemia - Continue statin.  7-history of diabetes with nephropathy - No longer taking any medication - Last A1c 5.0 - Will continue to follow CBG fluctuation - Patient no eating much at the moment.  8-left great toe with incarnated nail and local paronychia - Some drainage appreciated and prior to admission cultures was taken and was positive for staph. - Oral doxycycline  and wound care will be continued. - Outpatient follow-up with podiatrist recommended.  9-hyperkalemia - Treated with sodium bicarbonate and Lokelma - Continue fluid resuscitation and follow electrolyte - Potassium improved - Continue telemetry monitoring.  10-pressure injury -stage 1 sacral and stage 2 left buttock wound -POA -no superimposed infection -continue local care and constant repositioning.  Subjective:  No fever, no chest pain, no nausea, no  vomiting.  Continue slowly improving and demonstrated further stabilization  in his electrolytes.  Oral intake is still limited.  Chronically ill in appearance.  Physical Exam: Vitals:   04/17/24 2008 04/18/24 0145 04/18/24 0450 04/18/24 1305  BP: (!) 129/109 (!) 135/59 (!) 108/92 (!) 124/92  Pulse: 69 77 (!) 43 85  Resp: 17 17 18    Temp: (!) 97.5 F (36.4 C) 98.1 F (36.7 C) 98 F (36.7 C) 98.2 F (36.8 C)  TempSrc: Oral Oral Oral Oral  SpO2: (!) 83% 100% 92% 97%  Weight:      Height:       General exam: Alert, awake, oriented x x 1, following commands appropriately (intermittently), in no acute distress. Respiratory system: Clear to auscultation. Respiratory effort normal.  Good saturation on room air. Cardiovascular system: Rate controlled, no rubs, no gallops, no JVD on exam. Gastrointestinal system: Abdomen is nondistended, soft and nontender. No organomegaly or masses felt. Normal bowel sounds heard. Central nervous system: Limited examination secondary to underlying dementia.  No focal neurological deficits. Extremities: No cyanosis or clubbing, no edema appreciated on exam. Skin: No petechiae.  Left great toe with improvement in swelling/tenderness and decrease drainage appreciated on exam.  Stage I sacral and a stage II left buttock pressure injury present at time of admission on change and is stable. Psychiatry: Judgement and insight appear impaired secondary to dementia.  Stable mood.  Data Reviewed: Basic metabolic panel: Sodium 146, potassium 4.5, chloride 118, bicarb 18, BUN 51, creatinine 1.47 and GFR 46 RAR:TARd 13.0, hemoglobin 9.1 and platelet count 361K  Family Communication: Daughter at bedside.  Disposition: Status is: Inpatient Remains inpatient appropriate because: Continue IV therapy.  After discussion with palliative care plan is for discharge home with [facility] services and outpatient palliative care.  Time spent: 50 minutes  Author: Eric Nunnery,  MD 04/18/2024 6:59 PM  For on call review [email or web address].

## 2024-04-18 NOTE — TOC Progression Note (Signed)
 Transition of Care [facility]) - Progression Note    Patient Details  Name: [name] MRN: [id number] Date of Birth: Feb 15, 1936  Transition of Care [facility]) CM/SW Contact  Sharlyne Stabs, RN Phone Number: 04/18/2024, 1:51 PM  Clinical Narrative:   Family planning to take patient home with Palliative and home health with [facility]. MD ordered Hospital bed and wheel chair. [facility] choices reviewed, Referral sent to [employer], discharge planning for Monday.     Expected Discharge Plan: Home w Home Health Services (Home with Palliative care) Barriers to Discharge: Continued Medical Work up      Expected Discharge Plan and Services In-house Referral: Clinical Social Work Discharge Planning Services: NA Post Acute Care Choice: Home Health, Skilled Nursing Facility Living arrangements for the past 2 months: Single Family Home                 DME Arranged: Wheelchair manual, Hospital bed DME Agency: [employer] Date DME Agency Contacted: 04/18/24 Time DME Agency Contacted: 1350 Representative spoke with at DME Agency: [facility] HH Arranged: PT, OT, Speech Therapy HH Agency: [facility] Date [facility] Agency Contacted: 04/17/24 Time HH Agency Contacted: 1438 Representative spoke with at [facility] Agency: [facility]   Social Drivers of Health (SDOH) Interventions SDOH Screenings   Food Insecurity: Patient Unable To Answer (04/15/2024)  Housing: Patient Unable To Answer (04/15/2024)  Transportation Needs: Patient Unable To Answer (04/15/2024)  Utilities: Patient Unable To Answer (04/15/2024)  Alcohol Screen: Low Risk  (01/03/2023)  Depression (PHQ2-9): Low Risk  (03/17/2024)  Financial Resource Strain: Low Risk  (01/03/2023)  Physical Activity: Insufficiently Active (01/03/2023)  Social Connections: Patient Unable To Answer (04/15/2024)  Stress: No Stress Concern Present (01/03/2023)  Tobacco Use: Low Risk  (04/15/2024)    Readmission Risk Interventions    04/16/2024    9:58 AM  Readmission Risk  Prevention Plan  Transportation Screening Complete  PCP or Specialist Appt within 3-5 Days Complete  HRI or Home Care Consult Complete  Social Work Consult for Recovery Care Planning/Counseling Complete  Palliative Care Screening Not Applicable  Medication Review Oceanographer) Complete

## 2024-04-18 NOTE — Progress Notes (Signed)
 Lab tech notified nursing staff that the patient had pulled one of his IV lines out and that he was actively attempting to pull at and remove external male purewick. Pt also noted to have ripped the top portion of his dressing that is intact to his L upper thigh/hip region. Bilateral hand mittens were placed on patient for safety and due to his attempts to remove purewick and his success in removal of one of his IV sites. Where pt pulled IV out to L arm, IV site noted to not have any bleeding and to be clean, dry and intact. Pts purewick is still working properly. Pts daughter remains at bedside asleep in recliner but does awaken and interact with staff upon staff providing care and entering room. Pts daughter stated that the patient had been c/o itching and request for this writer to ask MD for a dose of Benadryl for the patient. This Clinical research associate did observe the patient scratching at bilateral arms and head while this writer was in the patients room. Pt unable to properly answer if he was itching or not due to dementia. This Clinical research associate did send NP on call a text page asking for Benadryl and making aware of placing patient back in bilateral hand mittens due to him pulling out one IV site and attempting to remove purewick. Awaiting for response from NP at this time. Pts daughter was understanding and agreed that bilateral hand mittens would be best for his own safety at this time since he was pulling out lines and pulling at lines.

## 2024-04-18 NOTE — Plan of Care (Signed)
   Problem: Education: Goal: Knowledge of General Education information will improve Description Including pain rating scale, medication(s)/side effects and non-pharmacologic comfort measures Outcome: Progressing   Problem: Clinical Measurements: Goal: Ability to maintain clinical measurements within normal limits will improve Outcome: Progressing   Problem: Activity: Goal: Risk for activity intolerance will decrease Outcome: Progressing

## 2024-04-18 NOTE — Progress Notes (Signed)
 HOSPITAL MEDICINE OVERNIGHT EVENT NOTE    Nursing states the patient is complaining of itching and family is requesting Benadryl.  Patient apparently received a dose of 12.5 mg of IV Benadryl this morning to good effect.  Nursing reports no evidence of new rash.  Itching does not seem to be focal.  Patient appears to only be on doxycycline  antibiotic wise and therefore it is unlikely that this would be the cause.  Possibly heat rash or contact hypersensitivity.  Must be mindful not to exacerbate patient's confusion with Benadryl so we will give a one-time dose of 12.5 mg and reassess.  Zachary JINNY Ba  MD [facility]

## 2024-04-19 DIAGNOSIS — R7989 Other specified abnormal findings of blood chemistry: Secondary | ICD-10-CM | POA: Diagnosis not present

## 2024-04-19 DIAGNOSIS — E870 Hyperosmolality and hypernatremia: Secondary | ICD-10-CM | POA: Diagnosis not present

## 2024-04-19 DIAGNOSIS — E875 Hyperkalemia: Secondary | ICD-10-CM | POA: Diagnosis not present

## 2024-04-19 DIAGNOSIS — E872 Acidosis, unspecified: Secondary | ICD-10-CM | POA: Diagnosis not present

## 2024-04-19 LAB — CBC
HCT: 27.5 % — ABNORMAL LOW (ref 39.0–52.0)
Hemoglobin: 8.8 g/dL — ABNORMAL LOW (ref 13.0–17.0)
MCH: 31.7 pg (ref 26.0–34.0)
MCHC: 32 g/dL (ref 30.0–36.0)
MCV: 98.9 fL (ref 80.0–100.0)
Platelets: 276 K/uL (ref 150–400)
RBC: 2.78 MIL/uL — ABNORMAL LOW (ref 4.22–5.81)
RDW: 13.2 % (ref 11.5–15.5)
WBC: 7.2 K/uL (ref 4.0–10.5)
nRBC: 0 % (ref 0.0–0.2)

## 2024-04-19 LAB — BASIC METABOLIC PANEL WITH GFR
Anion gap: 13 (ref 5–15)
BUN: 43 mg/dL — ABNORMAL HIGH (ref 8–23)
CO2: 18 mmol/L — ABNORMAL LOW (ref 22–32)
Calcium: 8.3 mg/dL — ABNORMAL LOW (ref 8.9–10.3)
Chloride: 114 mmol/L — ABNORMAL HIGH (ref 98–111)
Creatinine, Ser: 1.55 mg/dL — ABNORMAL HIGH (ref 0.61–1.24)
GFR, Estimated: 43 mL/min — ABNORMAL LOW (ref >60)
Glucose, Bld: 100 mg/dL — ABNORMAL HIGH (ref 70–99)
Potassium: 4.3 mmol/L (ref 3.5–5.1)
Sodium: 146 mmol/L — ABNORMAL HIGH (ref 135–145)

## 2024-04-19 LAB — GLUCOSE, CAPILLARY
Glucose-Capillary: 105 mg/dL — ABNORMAL HIGH (ref 70–99)
Glucose-Capillary: 109 mg/dL — ABNORMAL HIGH (ref 70–99)
Glucose-Capillary: 133 mg/dL — ABNORMAL HIGH (ref 70–99)
Glucose-Capillary: 157 mg/dL — ABNORMAL HIGH (ref 70–99)

## 2024-04-19 MED ORDER — SODIUM CHLORIDE 0.45 % IV SOLN: INTRAVENOUS | Status: AC

## 2024-04-19 NOTE — Plan of Care (Signed)
  Problem: Education: Goal: Knowledge of General Education information will improve Description: Including pain rating scale, medication(s)/side effects and non-pharmacologic comfort measures Outcome: Progressing   Problem: Clinical Measurements: Goal: Ability to maintain clinical measurements within normal limits will improve Outcome: Progressing   Problem: Clinical Measurements: Goal: Diagnostic test results will improve Outcome: Progressing   Problem: Nutrition: Goal: Adequate nutrition will be maintained Outcome: Progressing   Problem: Activity: Goal: Risk for activity intolerance will decrease Outcome: Progressing   Problem: Skin Integrity: Goal: Risk for impaired skin integrity will decrease Outcome: Progressing

## 2024-04-19 NOTE — Plan of Care (Signed)

## 2024-04-19 NOTE — TOC Progression Note (Signed)
 Transition of Care [facility]) - Progression Note    Patient Details  Name: [name] MRN: [id number] Date of Birth: Jun 25, 1936  Transition of Care [facility]) CM/SW Contact  Lucie Lunger, [place] Phone Number: 04/19/2024, 10:01 AM  Clinical Narrative:    CSW spoke to [employer] weekend rep who states referral was placed for wheelchair, hospital bed and hoyer lift for pt. They are working on contacting family and getting delivery time arranged. TOC to follow.   Expected Discharge Plan: Home w Home Health Services (Home with Palliative care) Barriers to Discharge: Continued Medical Work up               Expected Discharge Plan and Services In-house Referral: Clinical Social Work Discharge Planning Services: NA Post Acute Care Choice: Home Health, Skilled Nursing Facility Living arrangements for the past 2 months: Single Family Home                 DME Arranged: Wheelchair manual, Hospital bed DME Agency: [employer] Date DME Agency Contacted: 04/18/24 Time DME Agency Contacted: 1350 Representative spoke with at DME Agency: [facility] HH Arranged: PT, OT, Speech Therapy HH Agency: [facility] Date [facility] Agency Contacted: 04/17/24 Time HH Agency Contacted: 1438 Representative spoke with at [facility] Agency: [facility]   Social Drivers of Health (SDOH) Interventions SDOH Screenings   Food Insecurity: Patient Unable To Answer (04/15/2024)  Housing: Patient Unable To Answer (04/15/2024)  Transportation Needs: Patient Unable To Answer (04/15/2024)  Utilities: Patient Unable To Answer (04/15/2024)  Alcohol Screen: Low Risk  (01/03/2023)  Depression (PHQ2-9): Low Risk  (03/17/2024)  Financial Resource Strain: Low Risk  (01/03/2023)  Physical Activity: Insufficiently Active (01/03/2023)  Social Connections: Patient Unable To Answer (04/15/2024)  Stress: No Stress Concern Present (01/03/2023)  Tobacco Use: Low Risk  (04/15/2024)    Readmission Risk Interventions    04/16/2024    9:58 AM   Readmission Risk Prevention Plan  Transportation Screening Complete  PCP or Specialist Appt within 3-5 Days Complete  HRI or Home Care Consult Complete  Social Work Consult for Recovery Care Planning/Counseling Complete  Palliative Care Screening Not Applicable  Medication Review Oceanographer) Complete

## 2024-04-19 NOTE — Progress Notes (Signed)
 Progress Note   Patient: [name] FMW:[id number] DOB: June 09, 1936 DOA: 04/15/2024     4 DOS: the patient was seen and examined on 04/19/2024   Brief hospital admission narrative: [name] is a 88 y.o. male with medical history significant of hypertension, hyperlipidemia, dementia, prior history of stroke, prediabetes and chronic kidney disease stage IV; who presented to the emergency department from the skilled nursing facility secondary to worsening baseline mentation, decreased oral intake and being less interactive.   Patient with chronic sacral wound for which he was recently started on doxycycline ; also with not too long ago hospitalization secondary to left hip fracture.  Facility reported decreased oral intake and there were concern for dehydration.  There has not been any fever, chills, chest pain, nausea, vomiting, dysuria, hematuria, melena, hematochezia, shortness of breath or sick contacts.   Workup in the ED demonstrating a CT head without acute intracranial normalities; chest x-ray without acute cardiopulmonary process, urinalysis not demonstrating any signs of acute infection.  Blood work with significant hyponatremia, acute on chronic renal failure and significant metabolic acidosis.  Initially there was concern for potential sepsis and patient was called for with Rocephin  and ceftriaxone ; in the absence of source of infection antibiotic has been discontinue and efforts has been tailor to provide treatment of acute on chronic renal failure, dehydration and hyponatremia.   TRH has been consulted to place patient in the hospital for further evaluation and management.  Assessment and plan 1-acute metabolic encephalopathy - In the setting of hypernatremia and dehydration. - Acute systemic infection process has been ruled out. - Continue fluid resuscitation and follow electrolyte trend. - Continue supportive care -Continue constant reorientation and follow clinical  response.. -on today's exam more interactive and following commands appropriately.  2-leukocytosis/metabolic and lactic acidosis - Appears to be in the setting of dehydration and stress demargination - Will continue fluid resuscitation and will follow WBCs trend. Patient is afebrile. - Patient overall metabolic acidosis and lactic acidosis has continue improving with fluid resuscitation. - follow clinical response.  3-acute on chronic renal failure - Stage IV at baseline - Continue minimizing nephrotoxic agents - Avoid hypotension and the use of contrast - Will continue fluid resuscitation - Follow urine output.  4-hypertension - Will continue to hold Cozaar  in the setting of acute on chronic renal failure - Overall vital signs stable - Avoid hypotension - Continue to follow vital signs.  5-history of dementia - Advance - Will continue supportive care - Continue the use of Namenda  and Exelon  - Palliative care consultation has been requested for goals of care and advance care planning. - Will continue dysphagia 2 diet with thin liquids.  6-hyperlipidemia - Continue statin.  7-history of diabetes with nephropathy - No longer taking any medication - Last A1c 5.0 - Will continue to follow CBG fluctuation - Patient no eating much at the moment.  8-left great toe with incarnated nail and local paronychia - Some drainage appreciated and prior to admission cultures was taken and was positive for staph. - Oral doxycycline  and wound care will be continued. - Outpatient follow-up with podiatrist recommended.  9-hyperkalemia - Treated with sodium bicarbonate and Lokelma - Continue fluid resuscitation and follow electrolyte - Potassium improved - Continue telemetry monitoring.  10-pressure injury -stage 1 sacral and stage 2 left buttock wound -POA -no superimposed infection -continue local care and constant repositioning.  11-complaining of itching - Continue as needed  Benadryl low-dose.  Subjective:  No fever, no chest pain, no nausea,  no vomiting.  Overall improved, demonstrating increasing his oral intake and in no major distress.  There was no significant overnight events.  Physical Exam: Vitals:   04/18/24 1305 04/18/24 2138 04/19/24 0549 04/19/24 1339  BP: (!) 124/92 94/71 (!) 93/59 104/81  Pulse: 85 77 71 73  Resp:  18 16   Temp: 98.2 F (36.8 C) (!) 97.5 F (36.4 C) 99.2 F (37.3 C) 98 F (36.7 C)  TempSrc: Oral Oral Oral Oral  SpO2: 97%  97% 96%  Weight:      Height:       General exam: Alert, awake, oriented x 1 intermittently; no chest pain, no nausea, no vomiting.  Per nursing staff demonstrated an increase in oral intake. Respiratory system: Good air movement bilaterally; no using accessory muscles.  Good saturation on room air. Cardiovascular system:RRR.  No rubs or gallops. Gastrointestinal system: Abdomen is nondistended, soft and nontender. No organomegaly or masses felt. Normal bowel sounds heard. Central nervous system: Generally weak; moving 4 limbs spontaneously.  No focal neurological deficits. Extremities/skin: No cyanosis or clubbing; left great toe with clean dressings in place.  They have an improvement in swelling, erythematous changes, tenderness and drainage.  No petechiae.  Unchanged stage I sacral pressure injury and a stage II left buttock pressure injury. Psychiatry: Judgement and insight appear impaired secondary to dementia.  Latest data Reviewed: Basic metabolic panel: Sodium 146, potassium 4.3, chloride 114, bicarb 18, BUN 43, creatinine 1.5 and GFR 43  RAR:TARd 7.2, hemoglobin 8.8 and platelet count 276K.  Family Communication: Daughter at bedside.  Disposition: Status is: Inpatient Remains inpatient appropriate because: Continue IV therapy.  After discussion with palliative care plan is for discharge home with [facility] services and outpatient palliative care.  Time spent: 50 minutes  Author: Eric Nunnery, MD 04/19/2024 4:41 PM  For on call review [email or web address].

## 2024-04-20 ENCOUNTER — Ambulatory Visit: Admitting: Podiatry

## 2024-04-20 ENCOUNTER — Telehealth: Payer: Self-pay | Admitting: Orthopedic Surgery

## 2024-04-20 DIAGNOSIS — R4182 Altered mental status, unspecified: Secondary | ICD-10-CM | POA: Diagnosis not present

## 2024-04-20 DIAGNOSIS — E875 Hyperkalemia: Secondary | ICD-10-CM

## 2024-04-20 DIAGNOSIS — L8990 Pressure ulcer of unspecified site, unspecified stage: Secondary | ICD-10-CM | POA: Insufficient documentation

## 2024-04-20 DIAGNOSIS — E872 Acidosis, unspecified: Secondary | ICD-10-CM

## 2024-04-20 DIAGNOSIS — E870 Hyperosmolality and hypernatremia: Secondary | ICD-10-CM | POA: Diagnosis not present

## 2024-04-20 DIAGNOSIS — R7989 Other specified abnormal findings of blood chemistry: Secondary | ICD-10-CM | POA: Diagnosis not present

## 2024-04-20 LAB — CULTURE, BLOOD (ROUTINE X 2)
Culture: NO GROWTH
Culture: NO GROWTH

## 2024-04-20 LAB — GLUCOSE, CAPILLARY
Glucose-Capillary: 94 mg/dL (ref 70–99)
Glucose-Capillary: 121 mg/dL — ABNORMAL HIGH (ref 70–99)

## 2024-04-20 MED ORDER — DOXYCYCLINE HYCLATE 100 MG PO CAPS: 100.0000 mg | ORAL_CAPSULE | Freq: Two times a day (BID) | ORAL | 0 refills | Status: DC

## 2024-04-20 MED ORDER — POLYETHYLENE GLYCOL 3350 17 G PO PACK: 17.0000 g | PACK | Freq: Every day | ORAL | 0 refills | Status: AC | PRN

## 2024-04-20 MED ORDER — SENNA 8.6 MG PO TABS: 1.0000 | ORAL_TABLET | Freq: Two times a day (BID) | ORAL | 0 refills | Status: AC

## 2024-04-20 MED ORDER — ACETAMINOPHEN 500 MG PO TABS: 1000.0000 mg | ORAL_TABLET | Freq: Three times a day (TID) | ORAL | Status: DC | PRN

## 2024-04-20 MED ORDER — NYSTATIN 100000 UNIT/ML MT SUSP: 5.0000 mL | Freq: Four times a day (QID) | OROMUCOSAL | 0 refills | Status: AC

## 2024-04-20 MED ORDER — ENSURE PLUS HIGH PROTEIN PO LIQD: 237.0000 mL | Freq: Two times a day (BID) | ORAL | Status: DC

## 2024-04-20 NOTE — Discharge Summary (Signed)
 Physician Discharge Summary   Patient: [name] MRN: [id number] DOB: 09/25/1935  Admit date:     04/15/2024  Discharge date: 04/20/24  Discharge Physician: Eric Nunnery   PCP: Zollie Lowers, MD   Recommendations at discharge:  {Tip this will not be part of the note when signed- Example include specific recommendations for outpatient follow-up, pending tests to follow-up on. (Optional):26781}  ***  Discharge Diagnoses: Principal Problem:   Hypernatremia Active Problems:   Acidosis   Altered mental status   Elevated troponin   Hyperkalemia   Pressure injury of skin  Resolved Problems:   * No resolved hospital problems. [facility] Course: No notes on file  Assessment and Plan: No notes have been filed under this hospital service. Service: Hospitalist     {Tip this will not be part of the note when signed Body mass index is 19.63 kg/m. , ,  Active Pressure Injury/Wound(s)     None          (Optional):26781}  {(NOTE) Pain control PDMP Statment (Optional):26782} Consultants: *** Procedures performed: ***  Disposition: {Plan; Disposition:26390} Diet recommendation:  Discharge Diet Orders (From admission, onward)     Start     Ordered   04/20/24 0000  Diet - low sodium heart healthy        04/20/24 1652           {Diet_Plan:26776} DISCHARGE MEDICATION: Allergies as of 04/20/2024       Reactions   Ace Inhibitors Swelling        Medication List     STOP taking these medications    losartan  50 MG tablet Commonly known as: COZAAR        TAKE these medications    acetaminophen  500 MG tablet Commonly known as: TYLENOL  Take 2 tablets (1,000 mg total) by mouth every 8 (eight) hours as needed for mild pain (pain score 1-3), fever, headache or moderate pain (pain score 4-6). What changed:  when to take this reasons to take this   albuterol  108 (90 Base) MCG/ACT inhaler Commonly known as: VENTOLIN  HFA Inhale 2 puffs into the lungs  every 4 (four) hours as needed for wheezing or shortness of breath.   aspirin  81 MG chewable tablet Chew 1 tablet (81 mg total) by mouth 2 (two) times daily.   atorvastatin  40 MG tablet Commonly known as: LIPITOR TAKE 1 TABLET EVERY DAY   doxycycline  100 MG capsule Commonly known as: VIBRAMYCIN  Take 1 capsule (100 mg total) by mouth 2 (two) times daily for 10 days.   feeding supplement Liqd Take 237 mLs by mouth 2 (two) times daily between meals. Start taking on: April 21, 2024   megestrol  400 MG/10ML suspension Commonly known as: MEGACE  Take 10 mLs (400 mg total) by mouth 2 (two) times daily. For appetite stimulation   memantine  5 MG tablet Commonly known as: NAMENDA  Take 1 tablet (5 mg total) by mouth 2 (two) times daily.   nystatin 100000 UNIT/ML suspension Commonly known as: MYCOSTATIN Take 5 mLs (500,000 Units total) by mouth 4 (four) times daily for 5 days.   oxyCODONE  5 MG immediate release tablet Commonly known as: Oxy IR/ROXICODONE  Take 5 mg by mouth every 6 (six) hours as needed for severe pain (pain score 7-10).   polyethylene glycol 17 g packet Commonly known as: MiraLax  Take 17 g by mouth daily as needed. What changed:  when to take this reasons to take this   QUEtiapine  25 MG tablet Commonly known as: SEROQUEL   Take 1 tablet (25 mg total) by mouth at bedtime. For sleep and for nerves What changed:  when to take this reasons to take this   rivastigmine  6 MG capsule Commonly known as: EXELON  Take 1 capsule (6 mg total) by mouth 2 (two) times daily. For memory   senna 8.6 MG Tabs tablet Commonly known as: SENOKOT Take 1 tablet (8.6 mg total) by mouth in the morning and at bedtime for 15 days.   Vashe Cleansing Soln Apply topically daily.               Durable Medical Equipment  (From admission, onward)           Start     Ordered   04/17/24 2141  For home use only DME Hospital bed  Once       Question Answer Comment  Length of  Need 12 Months   The above medical condition requires: Patient requires the ability to reposition frequently   Head must be elevated greater than: 30 degrees   Bed type Semi-electric   Hoyer Lift Yes      04/17/24 2141   04/17/24 2134  For home use only DME lightweight manual wheelchair with seat cushion  Once       Comments: Patient suffers from hip fracture which impairs their ability to perform daily activities like walking long distances in the home.  A cane, crutch, or walker will not resolve the issue due to limited ability to put weight on his leg currently while covering long distance. issue with performing activities of daily living. A wheelchair will allow patient to safely perform daily activities. Patient is not able to propel themselves in the home using a standard weight wheelchair due to general weakness. Patient can self propel in the lightweight wheelchair. Length of need 12 months . Accessories: elevating leg rests (ELRs), wheel locks, extensions and anti-tippers.   04/17/24 2141              Discharge Care Instructions  (From admission, onward)           Start     Ordered   04/20/24 0000  Discharge wound care:       Comments: Daily wound care: Apply Bactroban to left great toe nailbed and in between toe space Q day, then cover with foam dressings.  Change foam dressings Q 3 days or PRN soiling.   04/20/24 1652            Follow-up Information     [facility] Follow up.   Why: [facility] will follow up with you at discharge to provide palliative care services Contact information: [address] [facility] [place]  [address] [phone]        [facility] Follow up.   Specialty: Home Health Services Why: [facility] will follow up with you 24-48 hours after discharge to provide home health services. Contact information: [address] [place] [address] [phone]         [facility] Follow up.    Why: Hospital bed and wheelchair will be delivered to the home.        Zollie Lowers, MD. Schedule an appointment as soon as possible for a visit in 10 day(s).   Specialty: Family Medicine Contact information: [address] [place] [address] [phone]                Discharge Exam: Fredricka Weights   04/15/24 1007 04/15/24 1334  Weight:  63.4 kg 60.3 kg   ***  Condition at discharge: {DC Condition:26389}  The results of significant diagnostics from this hospitalization (including imaging, microbiology, ancillary and laboratory) are listed below for reference.   Imaging Studies: CT Head Wo Contrast Result Date: 04/15/2024 CLINICAL DATA:  Altered mental status, hypotension, lethargy EXAM: CT HEAD WITHOUT CONTRAST CT CHEST, ABDOMEN AND PELVIS WITHOUT CONTRAST TECHNIQUE: Contiguous axial images were obtained from the base of the skull through the vertex without intravenous contrast. Multidetector CT imaging of the chest, abdomen and pelvis was performed without administration of intravenous contrast. RADIATION DOSE REDUCTION: This exam was performed according to the departmental dose-optimization program which includes automated exposure control, adjustment of the mA and/or kV according to patient size and/or use of iterative reconstruction technique. COMPARISON:  CT chest, 01/24/2008 FINDINGS: CT HEAD FINDINGS Brain: No evidence of acute infarction, hemorrhage, hydrocephalus, extra-axial collection or mass lesion/mass effect. Extensive periventricular and deep white matter hypodensity. Mild global cerebral volume loss. Vascular: No hyperdense vessel or unexpected calcification. Skull: Normal. Negative for fracture or focal lesion. Sinuses/Orbits: No acute finding. Other: None. CT CHEST FINDINGS Cardiovascular: Aortic atherosclerosis. Normal heart size. Three-vessel coronary artery calcifications. No pericardial effusion. Mediastinum/Nodes: No enlarged mediastinal, hilar, or  axillary lymph nodes. Thyroid  gland, trachea, and esophagus demonstrate no significant findings. Lungs/Pleura: Unchanged rounded atelectasis of the dependent right lung base with a small, chronic, loculated right pleural effusion and associated pleural thickening. Musculoskeletal: No chest wall abnormality. No acute osseous findings. CT ABDOMEN PELVIS FINDINGS Hepatobiliary: No focal liver abnormality is seen. Status post cholecystectomy. No biliary dilatation. Pancreas: Unremarkable. No pancreatic ductal dilatation or surrounding inflammatory changes. Spleen: Normal in size without significant abnormality. Adrenals/Urinary Tract: Definitively benign macroscopic fat containing bilateral adrenal adenomata for which no further follow-up or characterization is required. Kidneys are normal, without renal calculi, solid lesion, or hydronephrosis. Bladder is unremarkable. Stomach/Bowel: Stomach is within normal limits. Appendix appears normal. No evidence of bowel wall thickening, distention, or inflammatory changes. Sigmoid diverticulosis. Vascular/Lymphatic: Aortic atherosclerosis. No enlarged abdominal or pelvic lymph nodes. Reproductive: No mass.  Inguinal right testicle. Other: No abdominal wall hernia or abnormality. No ascites. Musculoskeletal: Intramedullary nail fixation of the proximal left femur with associated acute fracture fragments. IMPRESSION: 1. No acute intracranial pathology. Small-vessel white matter disease and global cerebral volume loss in keeping with advanced patient age. 2. No acute noncontrast CT findings of the chest, abdomen, or pelvis. 3. Unchanged rounded atelectasis of the dependent right lung base with a small, chronic, loculated right pleural effusion and associated pleural thickening. 4. Intramedullary nail fixation of the proximal left femur with associated acute fracture fragments. 5. Coronary artery disease. Aortic Atherosclerosis (ICD10-I70.0). Electronically Signed   By: Marolyn JONETTA Jaksch M.D.   On: 04/15/2024 11:43   CT CHEST ABDOMEN PELVIS WO CONTRAST Result Date: 04/15/2024 CLINICAL DATA:  Altered mental status, hypotension, lethargy EXAM: CT HEAD WITHOUT CONTRAST CT CHEST, ABDOMEN AND PELVIS WITHOUT CONTRAST TECHNIQUE: Contiguous axial images were obtained from the base of the skull through the vertex without intravenous contrast. Multidetector CT imaging of the chest, abdomen and pelvis was performed without administration of intravenous contrast. RADIATION DOSE REDUCTION: This exam was performed according to the departmental dose-optimization program which includes automated exposure control, adjustment of the mA and/or kV according to patient size and/or use of iterative reconstruction technique. COMPARISON:  CT chest, 01/24/2008 FINDINGS: CT HEAD FINDINGS Brain: No evidence of acute infarction, hemorrhage, hydrocephalus, extra-axial collection or mass lesion/mass effect. Extensive periventricular and deep white matter  hypodensity. Mild global cerebral volume loss. Vascular: No hyperdense vessel or unexpected calcification. Skull: Normal. Negative for fracture or focal lesion. Sinuses/Orbits: No acute finding. Other: None. CT CHEST FINDINGS Cardiovascular: Aortic atherosclerosis. Normal heart size. Three-vessel coronary artery calcifications. No pericardial effusion. Mediastinum/Nodes: No enlarged mediastinal, hilar, or axillary lymph nodes. Thyroid  gland, trachea, and esophagus demonstrate no significant findings. Lungs/Pleura: Unchanged rounded atelectasis of the dependent right lung base with a small, chronic, loculated right pleural effusion and associated pleural thickening. Musculoskeletal: No chest wall abnormality. No acute osseous findings. CT ABDOMEN PELVIS FINDINGS Hepatobiliary: No focal liver abnormality is seen. Status post cholecystectomy. No biliary dilatation. Pancreas: Unremarkable. No pancreatic ductal dilatation or surrounding inflammatory changes. Spleen: Normal  in size without significant abnormality. Adrenals/Urinary Tract: Definitively benign macroscopic fat containing bilateral adrenal adenomata for which no further follow-up or characterization is required. Kidneys are normal, without renal calculi, solid lesion, or hydronephrosis. Bladder is unremarkable. Stomach/Bowel: Stomach is within normal limits. Appendix appears normal. No evidence of bowel wall thickening, distention, or inflammatory changes. Sigmoid diverticulosis. Vascular/Lymphatic: Aortic atherosclerosis. No enlarged abdominal or pelvic lymph nodes. Reproductive: No mass.  Inguinal right testicle. Other: No abdominal wall hernia or abnormality. No ascites. Musculoskeletal: Intramedullary nail fixation of the proximal left femur with associated acute fracture fragments. IMPRESSION: 1. No acute intracranial pathology. Small-vessel white matter disease and global cerebral volume loss in keeping with advanced patient age. 2. No acute noncontrast CT findings of the chest, abdomen, or pelvis. 3. Unchanged rounded atelectasis of the dependent right lung base with a small, chronic, loculated right pleural effusion and associated pleural thickening. 4. Intramedullary nail fixation of the proximal left femur with associated acute fracture fragments. 5. Coronary artery disease. Aortic Atherosclerosis (ICD10-I70.0). Electronically Signed   By: Marolyn JONETTA Jaksch M.D.   On: 04/15/2024 11:43   DG Chest Port 1 View Result Date: 04/15/2024 CLINICAL DATA:  Altered mental status. EXAM: PORTABLE CHEST 1 VIEW COMPARISON:  03/29/2024 FINDINGS: Thin linear densities at the right lung base could represent scarring or atelectasis. No significant airspace disease or pulmonary edema. Overlying densities near the left costophrenic angle. Heart size is stable and within normal limits. Volume loss at the medial right lung base appears chronic. Negative for a pneumothorax. IMPRESSION: 1. No acute cardiopulmonary disease. 2. Chronic  changes at the right lung base. Electronically Signed   By: Juliene Balder M.D.   On: 04/15/2024 11:12   DG HIP UNILAT WITH PELVIS 2-3 VIEWS LEFT Result Date: 03/30/2024 CLINICAL DATA:  Postoperative left hip fixation. EXAM: DG HIP (WITH OR WITHOUT PELVIS) 2-3V LEFT COMPARISON:  Intraoperative fluoroscopy 03/30/2024. Left hip radiographs 03/29/2024 FINDINGS: Interval postoperative changes with internal fixation of the left hip using short intramedullary rod with femoral neck compression screws and distal locking screw. Near anatomic alignment of the left hip is demonstrated postoperatively with displacement of the lesser trochanteric fracture fragment. No dislocation at the hip joint. Surgical hardware appears intact. Skin clips and soft tissue gas are consistent with recent surgery. Vascular calcifications are present. IMPRESSION: Postoperative changes with internal fixation of inter trochanteric fractures of the left proximal femur. Electronically Signed   By: Elsie Gravely M.D.   On: 03/30/2024 23:23   DG HIP UNILAT WITH PELVIS 2-3 VIEWS LEFT Result Date: 03/30/2024 CLINICAL DATA:  Elective surgery.  Intramedullary left femur. EXAM: DG HIP (WITH OR WITHOUT PELVIS) 2-3V LEFT COMPARISON:  03/29/2024 FINDINGS: Intraoperative fluoroscopy of the left hip is obtained for surgical control purposes. Fluoroscopy time is recorded at 87.5  seconds. Dose 19.82 mGy. Five spot fluoroscopic images are obtained. Spot fluoroscopic images demonstrate internal fixation of inter trochanteric fracture of the left hip using short intramedullary rod with compression bolts and distal locking screw. Near anatomic alignment is suggested. No dislocation at the hip joint. IMPRESSION: Intraoperative fluoroscopy is utilized for surgical control purposes demonstrating internal fixation of inter trochanteric fractures of the proximal left femur. Electronically Signed   By: Elsie Gravely M.D.   On: 03/30/2024 23:22   DG C-Arm 1-60  Min-No Report Result Date: 03/30/2024 Fluoroscopy was utilized by the requesting physician.  No radiographic interpretation.   DG Toe Great Left Result Date: 03/30/2024 EXAM: 3 VIEW(S) XRAY OF THE LEFT TOES 03/30/2024 01:58:09 AM COMPARISON: None available. CLINICAL HISTORY: Open wound of left great toe. FINDINGS: BONES AND JOINTS: No acute fracture. No cortical destruction to suggest osteomyelitis. No joint dislocation. SOFT TISSUES: Mild soft tissue swelling along the first digit. IMPRESSION: 1. Mild soft tissue swelling. 2. No evidence of osteomyelitis. Electronically signed by: Pinkie Pebbles MD 03/30/2024 02:03 AM EDT RP Workstation: [id number]   DG Knee 1-2 Views Left Result Date: 03/29/2024 EXAM: 2 VIEW(S) XRAY OF THE LEFT KNEE 03/29/2024 10:10:26 PM COMPARISON: None available. CLINICAL HISTORY: 747648 Post-operative state 252351. Table formatting from the original note was not included. Pt arrives w/ [facility] per EMS: pt comes from home w/ c/o mechanical witnessed fall tonight \\T \ landed left hip. C/o 10/10 left hip pain. No obvious deformity per EMS. Denies thinners, denies hitting head. preop Pt arrives w/ [facility] per EMS: pt comes from home w/ c/o mechanical witnessed fall tonight \\T \ landed left hip. C/o 10/10 left hip pain. No obvious deformity per EMS. Denies thinners, denies hitting head. FINDINGS: BONES AND JOINTS: No acute fracture. No focal osseous lesion. No joint dislocation. No significant joint effusion. Mild tricompartmental degenerative changes, most prominent in the medial compartment. SOFT TISSUES: The soft tissues are unremarkable. IMPRESSION: 1. No acute fracture or dislocation. Electronically signed by: Pinkie Pebbles MD 03/29/2024 10:15 PM EDT RP Workstation: [id number]   DG Hip Unilat With Pelvis 2-3 Views Left Result Date: 03/29/2024 CLINICAL DATA:  Preop EXAM: DG HIP (WITH OR WITHOUT PELVIS) 2-3V LEFT COMPARISON:  None Available. FINDINGS: Left femoral  intertrochanteric fracture with mild displacement and varus angulation. No subluxation or dislocation. Mild symmetric degenerative changes in the hips. IMPRESSION: Displaced, mildly angulated left femoral intertrochanteric fracture. Electronically Signed   By: Franky Crease M.D.   On: 03/29/2024 21:21   DG Chest 1 View Result Date: 03/29/2024 CLINICAL DATA:  Preoperative EXAM: CHEST  1 VIEW COMPARISON:  Chest x-ray 04/29/2020 FINDINGS: The heart size and mediastinal contours are within normal limits. Both lungs are clear. The visualized skeletal structures are unremarkable. IMPRESSION: No active disease. Electronically Signed   By: Greig Pique M.D.   On: 03/29/2024 21:20    Microbiology: Results for orders placed or performed during the hospital encounter of 04/15/24  Resp panel by RT-PCR (RSV, Flu A&B, Covid) Anterior Nasal Swab     Status: None   Collection Time: 04/15/24 10:35 AM   Specimen: Anterior Nasal Swab  Result Value Ref Range Status   SARS Coronavirus 2 by RT PCR NEGATIVE NEGATIVE Final    Comment: (NOTE) SARS-CoV-2 target nucleic acids are NOT DETECTED.  The SARS-CoV-2 RNA is generally detectable in upper respiratory specimens during the acute phase of infection. The lowest concentration of SARS-CoV-2 viral copies this assay can detect is 138 copies/mL. A negative result does not  preclude SARS-Cov-2 infection and should not be used as the sole basis for treatment or other patient management decisions. A negative result may occur with  improper specimen collection/handling, submission of specimen other than nasopharyngeal swab, presence of viral mutation(s) within the areas targeted by this assay, and inadequate number of viral copies(<138 copies/mL). A negative result must be combined with clinical observations, patient history, and epidemiological information. The expected result is Negative.  Fact Sheet for Patients:  BloggerCourse.com  Fact  Sheet for Healthcare Providers:  SeriousBroker.it  This test is no t yet approved or cleared by the [place]  FDA and  has been authorized for detection and/or diagnosis of SARS-CoV-2 by FDA under an Emergency Use Authorization (EUA). This EUA will remain  in effect (meaning this test can be used) for the duration of the COVID-19 declaration under Section 564(b)(1) of the Act, 21 U.S.C.section 360bbb-3(b)(1), unless the authorization is terminated  or revoked sooner.       Influenza A by PCR NEGATIVE NEGATIVE Final   Influenza B by PCR NEGATIVE NEGATIVE Final    Comment: (NOTE) The Xpert Xpress SARS-CoV-2/FLU/RSV plus assay is intended as an aid in the diagnosis of influenza from Nasopharyngeal swab specimens and should not be used as a sole basis for treatment. Nasal washings and aspirates are unacceptable for Xpert Xpress SARS-CoV-2/FLU/RSV testing.  Fact Sheet for Patients: BloggerCourse.com  Fact Sheet for Healthcare Providers: SeriousBroker.it  This test is not yet approved or cleared by the [place]  FDA and has been authorized for detection and/or diagnosis of SARS-CoV-2 by FDA under an Emergency Use Authorization (EUA). This EUA will remain in effect (meaning this test can be used) for the duration of the COVID-19 declaration under Section 564(b)(1) of the Act, 21 U.S.C. section 360bbb-3(b)(1), unless the authorization is terminated or revoked.     Resp Syncytial Virus by PCR NEGATIVE NEGATIVE Final    Comment: (NOTE) Fact Sheet for Patients: BloggerCourse.com  Fact Sheet for Healthcare Providers: SeriousBroker.it  This test is not yet approved or cleared by the [place]  FDA and has been authorized for detection and/or diagnosis of SARS-CoV-2 by FDA under an Emergency Use Authorization (EUA). This EUA will remain in effect  (meaning this test can be used) for the duration of the COVID-19 declaration under Section 564(b)(1) of the Act, 21 U.S.C. section 360bbb-3(b)(1), unless the authorization is terminated or revoked.  Performed at [facility], [address] [place], [place] [address]   Culture, blood (routine x 2)     Status: None   Collection Time: 04/15/24 12:22 PM   Specimen: Right Antecubital; Blood  Result Value Ref Range Status   Specimen Description   Final    RIGHT ANTECUBITAL BOTTLES DRAWN AEROBIC AND ANAEROBIC   Special Requests   Final    Blood Culture results may not be optimal due to an inadequate volume of blood received in culture bottles   Culture   Final    NO GROWTH 5 DAYS Performed at [facility], [address] [place], [place] [address]    Report Status 04/20/2024 FINAL  Final  Culture, blood (routine x 2)     Status: None   Collection Time: 04/15/24 12:22 PM   Specimen: BLOOD RIGHT HAND  Result Value Ref Range Status   Specimen Description BLOOD RIGHT HAND BOTTLES DRAWN AEROBIC ONLY  Final   Special Requests   Final    Blood Culture results may not be optimal due to an inadequate volume of blood received  in culture bottles   Culture   Final    NO GROWTH 5 DAYS Performed at [facility], [address] [place], [place] [address]    Report Status 04/20/2024 FINAL  Final  MRSA Next Gen by PCR, Nasal     Status: None   Collection Time: 04/15/24  1:13 PM   Specimen: Nasal Mucosa; Nasal Swab  Result Value Ref Range Status   MRSA by PCR Next Gen NOT DETECTED NOT DETECTED Final    Comment: (NOTE) The GeneXpert MRSA Assay (FDA approved for NASAL specimens only), is one component of a comprehensive MRSA colonization surveillance program. It is not intended to diagnose MRSA infection nor to guide or monitor treatment for MRSA infections. Test performance is not FDA approved in patients less than 39 years old. Performed at [facility], [address] [place], [place] [address]      Labs: CBC: Recent Labs  Lab 04/15/24 0645 04/15/24 1038 04/16/24 0346 04/19/24 0324  WBC 17.5* 17.7* 13.0* 7.2  NEUTROABS 14.0* 14.2*  --   --   HGB 10.7* 10.2* 9.1* 8.8*  HCT 33.4* 33.0* 30.3* 27.5*  MCV 100.0 101.2* 104.5* 98.9  PLT 467* 480* 361 276   Basic Metabolic Panel: Recent Labs  Lab 04/15/24 1222 04/15/24 1829 04/16/24 0346 04/17/24 0326 04/18/24 0350 04/19/24 0324  NA  --  152* 151* 148* 146* 146*  K  --  4.8 4.9 4.3 4.5 4.3  CL  --  123* 121* 118* 118* 114*  CO2  --  17* 16* 17* 18* 18*  GLUCOSE  --  140* 146* 126* 112* 100*  BUN  --  103* 93* 71* 51* 43*  CREATININE  --  2.29* 2.15* 1.72* 1.47* 1.55*  CALCIUM   --  9.1 8.8* 8.4* 8.6* 8.3*  MG 3.1*  --   --   --   --   --   PHOS 5.1*  --   --   --   --   --    Liver Function Tests: Recent Labs  Lab 04/15/24 0645 04/15/24 1038  AST 19 22  ALT 35 37  ALKPHOS 131* 135*  BILITOT 0.6 0.7  PROT 8.7* 8.8*  ALBUMIN 4.0 4.1   CBG: Recent Labs  Lab 04/19/24 1641 04/19/24 2022 04/19/24 2356 04/20/24 0720 04/20/24 1616  GLUCAP 133* 157* 109* 94 121*    Discharge time spent: {LESS THAN/GREATER THAN:26388} 30 minutes.  Signed: Eric Nunnery, MD [facility] 04/20/2024

## 2024-04-20 NOTE — TOC Progression Note (Signed)
 Transition of Care [facility]) - Progression Note    Patient Details  Name: [name] MRN: [id number] Date of Birth: [birth year]  Transition of Care [facility]) CM/SW Contact  Hoy DELENA Bigness, LCSW Phone Number: 04/20/2024, 2:23 PM  Clinical Narrative:    Per Darlyn w/ [facility] pt's hospital bed is to be delivered to the home today. Unable to provide ETA.  Spouse has not received call from [employer] with ETA but agrees to reach out to CSW if she receives call and/or if bed is delivered.    Expected Discharge Plan: Home w Home Health Services (Home with Palliative care) Barriers to Discharge: Continued Medical Work up               Expected Discharge Plan and Services In-house Referral: Clinical Social Work Discharge Planning Services: NA Post Acute Care Choice: Home Health, Skilled Nursing Facility Living arrangements for the past 2 months: Single Family Home                 DME Arranged: Wheelchair manual, Hospital bed DME Agency: [facility] Date DME Agency Contacted: 04/18/24 Time DME Agency Contacted: 1350 Representative spoke with at DME Agency: [facility] HH Arranged: PT, OT, Speech Therapy HH Agency: [facility] Date [facility] Agency Contacted: 04/17/24 Time HH Agency Contacted: 1438 Representative spoke with at [facility] Agency: Joane   Social Drivers of Health (SDOH) Interventions SDOH Screenings   Food Insecurity: Patient Unable To Answer (04/15/2024)  Housing: Patient Unable To Answer (04/15/2024)  Transportation Needs: Patient Unable To Answer (04/15/2024)  Utilities: Patient Unable To Answer (04/15/2024)  Alcohol Screen: Low Risk  (01/03/2023)  Depression (PHQ2-9): Low Risk  (03/17/2024)  Financial Resource Strain: Low Risk  (01/03/2023)  Physical Activity: Insufficiently Active (01/03/2023)  Social Connections: Patient Unable To Answer (04/15/2024)  Stress: No Stress Concern Present (01/03/2023)  Tobacco Use: Low Risk  (04/15/2024)    Readmission Risk  Interventions    04/16/2024    9:58 AM  Readmission Risk Prevention Plan  Transportation Screening Complete  PCP or Specialist Appt within 3-5 Days Complete  HRI or Home Care Consult Complete  Social Work Consult for Recovery Care Planning/Counseling Complete  Palliative Care Screening Not Applicable  Medication Review [employer]) Complete

## 2024-04-20 NOTE — Care Management Important Message (Signed)
 Important Message  Patient Details  Name: [name] MRN: [id number] Date of Birth: 11/02/1935   Important Message Given:  Yes - Medicare IM     Quinita Kostelecky L Ronique Simerly 04/20/2024, 4:07 PM

## 2024-04-20 NOTE — Telephone Encounter (Signed)
 I called and addressed this already with wife

## 2024-04-20 NOTE — Plan of Care (Signed)

## 2024-04-20 NOTE — Telephone Encounter (Signed)
 Pt called on behalf of her husband who had surgery, but missed his appt due to sickness. Pt has appt on 11/6, but wife is concerned that his staples need to come out before then. Pt call back number is [phone]. Wife's name is Rock

## 2024-04-20 NOTE — Telephone Encounter (Signed)
 Appt made and wife is aware of this

## 2024-04-21 ENCOUNTER — Telehealth: Payer: Self-pay

## 2024-04-21 DIAGNOSIS — I6930 Unspecified sequelae of cerebral infarction: Secondary | ICD-10-CM | POA: Diagnosis not present

## 2024-04-21 NOTE — Transitions of Care (Post Inpatient/ED Visit) (Signed)
   04/21/2024  Name: [name] MRN: [id number] DOB: 1935-07-25  Today's TOC FU Call Status: Today's TOC FU Call Status:: Unsuccessful Call (1st Attempt) Unsuccessful Call (1st Attempt) Date: 04/21/24  Attempted to reach the patient regarding the most recent Inpatient/ED visit.  Follow Up Plan: Additional outreach attempts will be made to reach the patient to complete the Transitions of Care (Post Inpatient/ED visit) call.   Alan Ee, RN, BSN, CEN [employer]- Transition of Care Team.  [facility] [phone]

## 2024-04-23 ENCOUNTER — Ambulatory Visit (INDEPENDENT_AMBULATORY_CARE_PROVIDER_SITE_OTHER): Payer: Self-pay

## 2024-04-23 ENCOUNTER — Ambulatory Visit: Admitting: Podiatry

## 2024-04-23 ENCOUNTER — Ambulatory Visit: Admitting: Orthopedic Surgery

## 2024-04-23 VITALS — Ht 69.0 in | Wt 132.9 lb

## 2024-04-23 DIAGNOSIS — S72142G Displaced intertrochanteric fracture of left femur, subsequent encounter for closed fracture with delayed healing: Secondary | ICD-10-CM

## 2024-04-23 DIAGNOSIS — L03032 Cellulitis of left toe: Secondary | ICD-10-CM | POA: Diagnosis not present

## 2024-04-23 MED ORDER — DOXYCYCLINE HYCLATE 100 MG PO CAPS: 100.0000 mg | ORAL_CAPSULE | Freq: Two times a day (BID) | ORAL | 0 refills | Status: AC

## 2024-04-23 NOTE — Progress Notes (Signed)
 Orthopedic Surgery Post-operative Office Visit  Procedure: left intertrochanteric femur fracture rodding Date of Surgery: 03/30/2024  Assessment: Patient is a 88 y.o. who is doing as expected after surgery   Plan: -Operative plans complete -Weightbearing as tolerated left lower extremity -Aspirin  81 mg twice daily for DVT prophylaxis -Staples removed today in office -Okay to let soap/water  run over the incisions but do not submerge -Pain management: OTC medications as needed -Return to office in 4 weeks, x-rays needed at next visit: AP/lateral left hip  ___________________________________________________________________________   Subjective: Patient is not having any pain in his hip with rest. He still has significant pain with weight bearing. No redness or drainage noted around his wounds.   Of note, patient has been mostly bedbound as he has difficulty mobilizing without significant assistance.  He is also been combative and confused which has limited his ability to get out of bed.  His intertrochanteric femur fracture has not healed yet.  He had to be transported to the office by EMS as a result.  Objective:  General: no acute distress, appropriate affect Neurologic: alert, answering questions appropriately, following commands Respiratory: unlabored breathing on room air Skin: incisions are well approximated with no erythema, induration, active/proximal drainage  MSK (LLE): EHL/TA/GSC intact, sensation intact to light touch in sural/saphenous/deep peroneal/superficial peroneal/tibial nerve distributions, foot warm and well-perfused  Imaging: X-rays of the left hip from 04/23/2024 was independently reviewed and interpreted, showing a minimally displaced left intertrochanteric femur fracture.  There is a short cephalomedullary rod in place.  No lucency seen around the lag or interlocking screws.  Fracture alignment appears unchanged since prior films on 03/30/2024.  No new fracture  seen.   [name] Patient MRN: [id number] Date of visit: 04/23/24

## 2024-04-23 NOTE — Patient Instructions (Signed)

## 2024-04-25 DIAGNOSIS — L03031 Cellulitis of right toe: Secondary | ICD-10-CM | POA: Diagnosis not present

## 2024-04-25 DIAGNOSIS — Z48817 Encounter for surgical aftercare following surgery on the skin and subcutaneous tissue: Secondary | ICD-10-CM | POA: Diagnosis not present

## 2024-04-25 DIAGNOSIS — N184 Chronic kidney disease, stage 4 (severe): Secondary | ICD-10-CM | POA: Diagnosis not present

## 2024-04-25 DIAGNOSIS — I129 Hypertensive chronic kidney disease with stage 1 through stage 4 chronic kidney disease, or unspecified chronic kidney disease: Secondary | ICD-10-CM | POA: Diagnosis not present

## 2024-04-25 DIAGNOSIS — E1122 Type 2 diabetes mellitus with diabetic chronic kidney disease: Secondary | ICD-10-CM | POA: Diagnosis not present

## 2024-04-25 DIAGNOSIS — G9341 Metabolic encephalopathy: Secondary | ICD-10-CM | POA: Diagnosis not present

## 2024-04-25 DIAGNOSIS — N179 Acute kidney failure, unspecified: Secondary | ICD-10-CM | POA: Diagnosis not present

## 2024-04-25 DIAGNOSIS — E860 Dehydration: Secondary | ICD-10-CM | POA: Diagnosis not present

## 2024-04-25 DIAGNOSIS — B958 Unspecified staphylococcus as the cause of diseases classified elsewhere: Secondary | ICD-10-CM | POA: Diagnosis not present

## 2024-04-26 NOTE — Progress Notes (Signed)
  Subjective:  Patient ID: [name], male    DOB: 1936/03/15,  MRN: [id number]  Chief Complaint  Patient presents with   Nail Problem    [facility]. Patient would like for provider to evaluate the left foot, great toe. Possible infection, left nail bed has moderate drainage. Pt currently taking doxycycline  for infection.    88 y.o. male presents with the above complaint. History confirmed with patient.  Had recent hip fracture and hospitalization this developed shortly after it and is in rehab now  Objective:  Physical Exam: warm, good capillary refill, no trophic changes or ulcerative lesions, normal DP and PT pulses, normal sensory exam, and left hallux nail loose with malodor  Assessment:   1. Paronychia of toe of left foot      Plan:  Patient was evaluated and treated and all questions answered.    Infected Nail, left -Patient elects to proceed with minor surgery to remove left great toenail today. Consent reviewed and signed by patient. - Left great nail excised. See procedure note. -Educated on post-procedure care including soaking. Written instructions provided and reviewed. -Rx for doxycycline  sent to pharmacy.    Procedure: Excision of Ingrown Toenail Location: Left 1st toe  nail. Anesthesia: Lidocaine  1% plain; 1.5 mL and Marcaine  0.5% plain; 1.5 mL, digital block. Skin Prep: Betadine . Dressing: Silvadene; telfa; dry, sterile, compression dressing. Technique: Following skin prep, the toe was exsanguinated and a tourniquet was secured at the base of the toe. The affected nail   was freed,  and excised.  The nailbed was irrigated out with alcohol. The tourniquet was then removed and sterile dressing applied. Disposition: Patient tolerated procedure well.    Return in about 3 weeks (around 05/14/2024) for nail re-check.

## 2024-04-27 ENCOUNTER — Telehealth: Payer: Self-pay

## 2024-04-27 NOTE — Transitions of Care (Post Inpatient/ED Visit) (Signed)
   04/27/2024  Name: OSHER OETTINGER MRN: [id number] DOB: 04/29/1936  Today's TOC FU Call Status: Today's TOC FU Call Status:: Unsuccessful Call (2nd Attempt) Unsuccessful Call (2nd Attempt) Date: 04/27/24  Attempted to reach the patient regarding the most recent Inpatient/ED visit.  Follow Up Plan: Additional outreach attempts will be made to reach the patient to complete the Transitions of Care (Post Inpatient/ED visit) call.   Arch Methot J. Krystyna Cleckley RN, MSN [facility], [facility] RN Care Manager Direct Dial: [phone]  Fax: [phone] Website: [email or web address]

## 2024-04-28 ENCOUNTER — Telehealth: Payer: Self-pay

## 2024-04-28 DIAGNOSIS — E860 Dehydration: Secondary | ICD-10-CM | POA: Diagnosis not present

## 2024-04-28 DIAGNOSIS — B958 Unspecified staphylococcus as the cause of diseases classified elsewhere: Secondary | ICD-10-CM | POA: Diagnosis not present

## 2024-04-28 DIAGNOSIS — I129 Hypertensive chronic kidney disease with stage 1 through stage 4 chronic kidney disease, or unspecified chronic kidney disease: Secondary | ICD-10-CM | POA: Diagnosis not present

## 2024-04-28 DIAGNOSIS — Z48817 Encounter for surgical aftercare following surgery on the skin and subcutaneous tissue: Secondary | ICD-10-CM | POA: Diagnosis not present

## 2024-04-28 DIAGNOSIS — N184 Chronic kidney disease, stage 4 (severe): Secondary | ICD-10-CM | POA: Diagnosis not present

## 2024-04-28 DIAGNOSIS — N179 Acute kidney failure, unspecified: Secondary | ICD-10-CM | POA: Diagnosis not present

## 2024-04-28 DIAGNOSIS — G9341 Metabolic encephalopathy: Secondary | ICD-10-CM | POA: Diagnosis not present

## 2024-04-28 DIAGNOSIS — L03031 Cellulitis of right toe: Secondary | ICD-10-CM | POA: Diagnosis not present

## 2024-04-28 NOTE — Transitions of Care (Post Inpatient/ED Visit) (Signed)
   04/28/2024  Name: [name] MRN: [id number] DOB: 02-11-1936  Today's TOC FU Call Status: Today's TOC FU Call Status:: Unsuccessful Call (3rd Attempt) Unsuccessful Call (3rd Attempt) Date: 04/28/24  Attempted to reach the patient regarding the most recent Inpatient/ED visit.  Follow Up Plan: Additional outreach attempts will be made to reach the patient to complete the Transitions of Care (Post Inpatient/ED visit) call.   Dua Mehler J. Aliveah Gallant RN, MSN [facility], [facility] RN Care Manager Direct Dial: [phone]  Fax: [phone] Website: [email or web address]

## 2024-04-29 DIAGNOSIS — N179 Acute kidney failure, unspecified: Secondary | ICD-10-CM | POA: Diagnosis not present

## 2024-04-29 DIAGNOSIS — E860 Dehydration: Secondary | ICD-10-CM | POA: Diagnosis not present

## 2024-04-29 DIAGNOSIS — E1122 Type 2 diabetes mellitus with diabetic chronic kidney disease: Secondary | ICD-10-CM | POA: Diagnosis not present

## 2024-04-29 DIAGNOSIS — I129 Hypertensive chronic kidney disease with stage 1 through stage 4 chronic kidney disease, or unspecified chronic kidney disease: Secondary | ICD-10-CM | POA: Diagnosis not present

## 2024-04-29 DIAGNOSIS — N184 Chronic kidney disease, stage 4 (severe): Secondary | ICD-10-CM | POA: Diagnosis not present

## 2024-04-29 DIAGNOSIS — L03031 Cellulitis of right toe: Secondary | ICD-10-CM | POA: Diagnosis not present

## 2024-04-29 DIAGNOSIS — B958 Unspecified staphylococcus as the cause of diseases classified elsewhere: Secondary | ICD-10-CM | POA: Diagnosis not present

## 2024-04-29 DIAGNOSIS — Z48817 Encounter for surgical aftercare following surgery on the skin and subcutaneous tissue: Secondary | ICD-10-CM | POA: Diagnosis not present

## 2024-04-29 DIAGNOSIS — G9341 Metabolic encephalopathy: Secondary | ICD-10-CM | POA: Diagnosis not present

## 2024-05-01 DIAGNOSIS — E1122 Type 2 diabetes mellitus with diabetic chronic kidney disease: Secondary | ICD-10-CM | POA: Diagnosis not present

## 2024-05-01 DIAGNOSIS — G9341 Metabolic encephalopathy: Secondary | ICD-10-CM | POA: Diagnosis not present

## 2024-05-01 DIAGNOSIS — B958 Unspecified staphylococcus as the cause of diseases classified elsewhere: Secondary | ICD-10-CM | POA: Diagnosis not present

## 2024-05-01 DIAGNOSIS — I129 Hypertensive chronic kidney disease with stage 1 through stage 4 chronic kidney disease, or unspecified chronic kidney disease: Secondary | ICD-10-CM | POA: Diagnosis not present

## 2024-05-01 DIAGNOSIS — E860 Dehydration: Secondary | ICD-10-CM | POA: Diagnosis not present

## 2024-05-01 DIAGNOSIS — N179 Acute kidney failure, unspecified: Secondary | ICD-10-CM | POA: Diagnosis not present

## 2024-05-01 DIAGNOSIS — Z48817 Encounter for surgical aftercare following surgery on the skin and subcutaneous tissue: Secondary | ICD-10-CM | POA: Diagnosis not present

## 2024-05-01 DIAGNOSIS — L03031 Cellulitis of right toe: Secondary | ICD-10-CM | POA: Diagnosis not present

## 2024-05-01 DIAGNOSIS — N184 Chronic kidney disease, stage 4 (severe): Secondary | ICD-10-CM | POA: Diagnosis not present

## 2024-05-05 ENCOUNTER — Telehealth: Payer: Self-pay

## 2024-05-05 NOTE — Telephone Encounter (Unsigned)
 Copied from CRM [id number]. Topic: General - Other >> May 05, 2024  9:59 AM Rosaria BRAVO wrote: Reason for CRM: pts wife called requesting home care assistance,  Please advise and call back  Best contact: [phone]

## 2024-05-05 NOTE — Telephone Encounter (Signed)
 Pt scheduled for Thursday. LS

## 2024-05-06 ENCOUNTER — Ambulatory Visit

## 2024-05-06 DIAGNOSIS — N184 Chronic kidney disease, stage 4 (severe): Secondary | ICD-10-CM | POA: Diagnosis not present

## 2024-05-06 DIAGNOSIS — B958 Unspecified staphylococcus as the cause of diseases classified elsewhere: Secondary | ICD-10-CM

## 2024-05-06 DIAGNOSIS — E860 Dehydration: Secondary | ICD-10-CM | POA: Diagnosis not present

## 2024-05-06 DIAGNOSIS — L03031 Cellulitis of right toe: Secondary | ICD-10-CM | POA: Diagnosis not present

## 2024-05-06 DIAGNOSIS — I129 Hypertensive chronic kidney disease with stage 1 through stage 4 chronic kidney disease, or unspecified chronic kidney disease: Secondary | ICD-10-CM | POA: Diagnosis not present

## 2024-05-06 DIAGNOSIS — F0394 Unspecified dementia, unspecified severity, with anxiety: Secondary | ICD-10-CM

## 2024-05-06 DIAGNOSIS — Z48817 Encounter for surgical aftercare following surgery on the skin and subcutaneous tissue: Secondary | ICD-10-CM

## 2024-05-06 DIAGNOSIS — N179 Acute kidney failure, unspecified: Secondary | ICD-10-CM

## 2024-05-06 DIAGNOSIS — G9341 Metabolic encephalopathy: Secondary | ICD-10-CM | POA: Diagnosis not present

## 2024-05-06 DIAGNOSIS — E1122 Type 2 diabetes mellitus with diabetic chronic kidney disease: Secondary | ICD-10-CM

## 2024-05-06 DIAGNOSIS — I69311 Memory deficit following cerebral infarction: Secondary | ICD-10-CM

## 2024-05-06 DIAGNOSIS — S72002D Fracture of unspecified part of neck of left femur, subsequent encounter for closed fracture with routine healing: Secondary | ICD-10-CM

## 2024-05-07 ENCOUNTER — Ambulatory Visit: Admitting: Physician Assistant

## 2024-05-07 ENCOUNTER — Telehealth: Admitting: Family Medicine

## 2024-05-07 DIAGNOSIS — S72142G Displaced intertrochanteric fracture of left femur, subsequent encounter for closed fracture with delayed healing: Secondary | ICD-10-CM

## 2024-05-07 DIAGNOSIS — I69998 Other sequelae following unspecified cerebrovascular disease: Secondary | ICD-10-CM | POA: Diagnosis not present

## 2024-05-07 DIAGNOSIS — Z794 Long term (current) use of insulin: Secondary | ICD-10-CM

## 2024-05-07 DIAGNOSIS — R531 Weakness: Secondary | ICD-10-CM

## 2024-05-07 DIAGNOSIS — F03918 Unspecified dementia, unspecified severity, with other behavioral disturbance: Secondary | ICD-10-CM

## 2024-05-07 DIAGNOSIS — Z7409 Other reduced mobility: Secondary | ICD-10-CM | POA: Diagnosis not present

## 2024-05-07 DIAGNOSIS — Z7401 Bed confinement status: Secondary | ICD-10-CM

## 2024-05-07 DIAGNOSIS — G9341 Metabolic encephalopathy: Secondary | ICD-10-CM | POA: Diagnosis not present

## 2024-05-07 DIAGNOSIS — E1169 Type 2 diabetes mellitus with other specified complication: Secondary | ICD-10-CM | POA: Diagnosis not present

## 2024-05-07 NOTE — Progress Notes (Signed)
 Subjective:    Patient ID: [name], male    DOB: April 24, 1936, 88 y.o.   MRN: [id number]   HPI: [name] is a 88 y.o. male presenting for   Discussed the use of AI scribe software for clinical note transcription with the patient, who gave verbal consent to proceed.  History of Present Illness [name] is an 88 year old male with dementia and a recent hip fracture who presents for evaluation of his care needs. He is accompanied by his daughter, who is involved in his care.  He has been experiencing increased confusion and combativeness, which has been challenging for his family to manage. He is unable to walk due to a recent hip fracture and requires assistance with feeding. He is not eating on his own and has a decreased appetite, stating 'I eat a little bit. I don't eat a lot. Just a tad.'  He was recently hospitalized after a fall that resulted in a hip fracture. He underwent surgery the day after the fall and was hospitalized from Monday to Friday at [facility]. Post-surgery, he was transferred to [facility] center for rehabilitation but did not receive effective therapy due to his dementia, which impaired his ability to follow instructions and participate in therapy. He is described as 'too weak' and has not had substantial therapy.  He is currently at home and requires significant assistance with daily activities, including bathing, feeding, and dressing. He has developed bed sores and had an infection on his toe, which required toenail removal. He feels 'pretty good' but also states he has 'never been so weak in my life.'  He experiences pain but is unable to specify the location. No hunger and has a limited appetite. His daughter is concerned about the level of care he is receiving and the need for more consistent support at home.      03/17/2024   11:17 AM 02/06/2024    2:23 PM 12/26/2023   10:04 AM 09/25/2023   10:26 AM 05/28/2023    2:29 PM  Depression screen PHQ  2/9  Decreased Interest 1 1 0 0 1  Down, Depressed, Hopeless 0 0 0 0 0  PHQ - 2 Score 1 1 0 0 1  Altered sleeping 0  0 0 1  Tired, decreased energy 1  0 1 1  Change in appetite 1  0  0  Feeling bad or failure about yourself  0  0 0 0  Trouble concentrating 1  0 0 1  Moving slowly or fidgety/restless 0  0 0 0  Suicidal thoughts 0  0 0 0  PHQ-9 Score 4  0 1 4  Difficult doing work/chores   Not difficult at all Not difficult at all Somewhat difficult     Relevant past medical, surgical, family and social history reviewed and updated as indicated.  Interim medical history since our last visit reviewed. Allergies and medications reviewed and updated.  ROS:  Review of Systems  Constitutional: Negative.   HENT: Negative.    Eyes:  Negative for visual disturbance.  Respiratory:  Negative for cough and shortness of breath.   Cardiovascular:  Negative for chest pain and leg swelling.  Gastrointestinal:  Negative for abdominal pain, diarrhea, nausea and vomiting.  Genitourinary:  Negative for difficulty urinating.  Musculoskeletal:  Positive for arthralgias. Negative for myalgias.  Skin:  Negative for rash.  Neurological:  Negative for headaches.  Psychiatric/Behavioral:  Negative for sleep disturbance.  Social History   Tobacco Use  Smoking Status Never  Smokeless Tobacco Never       Objective:     Wt Readings from Last 3 Encounters:  04/23/24 132 lb 15 oz (60.3 kg)  04/15/24 132 lb 15 oz (60.3 kg)  04/06/24 139 lb 12.8 oz (63.4 kg)     Exam deferred. Video visit performed.   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) -     Ambulatory referral to Hospice  Bedbound -     Ambulatory referral to Hospice      Diagnoses and all orders for this visit:  Dementia with behavioral disturbance (HCC) -     Ambulatory referral to Hospice  Immobility -     Ambulatory referral to Hospice  Closed displaced intertrochanteric fracture of left femur with delayed healing, subsequent encounter -     Ambulatory referral to Hospice  Type 2 diabetes mellitus with other specified complication, with long-term current use of insulin  (HCC) -     Ambulatory referral to Hospice  Weakness as late effect of cerebrovascular accident (CVA) -     Ambulatory referral to Hospice  Bedbound -     Ambulatory referral to Hospice    Assessment and Plan Assessment & Plan Dementia with behavioral disturbance and functional decline   He exhibits confusion, combativeness, and significant functional decline, worsened by a recent hip fracture and hospitalization. Dementia affects his ability to engage in physical therapy and perform daily activities independently. Coordinate with hospice for behavioral management support.  Status post hip fracture with surgical repair and generalized weakness   Following hip fracture repair, he experiences ongoing generalized weakness. He is unable to walk and has not received effective physical therapy due to cognitive limitations from dementia. He reports feeling very weak and has a limited appetite.  Pressure ulcers   He developed pressure ulcers due to immobility and prolonged bed rest after hip fracture and surgery.  Feeding dependence   He is unable to feed himself and requires assistance with meals. He has a poor appetite and reports eating only a small amount. Coordinate with hospice for assistance with feeding.  Post-surgical wound care, toe (status post toenail removal)   He is  recovering from recent toenail removal due to infection.  Palliative and hospice care needs   With advanced age, dementia, and a recent hip fracture, he is currently receiving palliative care with monthly check-ins but requires more frequent support. Hospice care is considered for comprehensive support, including medication management and assistance with daily activities. Hospice requires certification of a life expectancy of six months or less, which can be met despite the challenge of not having seen him in person. Initiate hospice care services and complete necessary paperwork for hospice certification.    Virtual Visit  Note  I discussed the limitations, risks, security and privacy concerns of performing an evaluation and management service by video and the availability of in person appointments. The patient was identified with two identifiers. Pt.expressed understanding and agreed to proceed. Pt. Is  at home. Dr. Zollie is in his office.  Follow Up Instructions:   I discussed the assessment and treatment plan with the patient. The patient was provided an opportunity to ask questions and all were answered. The patient agreed with the plan and demonstrated an understanding of the instructions.   The patient was advised to call back or seek an in-person evaluation if the symptoms worsen or if the condition fails to improve as anticipated.     Follow up plan: Return in about 2 weeks (around 05/21/2024).  Butler Zollie, MD [facility]

## 2024-05-10 ENCOUNTER — Encounter: Payer: Self-pay | Admitting: Family Medicine

## 2024-05-12 DIAGNOSIS — B958 Unspecified staphylococcus as the cause of diseases classified elsewhere: Secondary | ICD-10-CM | POA: Diagnosis not present

## 2024-05-12 DIAGNOSIS — L03031 Cellulitis of right toe: Secondary | ICD-10-CM | POA: Diagnosis not present

## 2024-05-12 DIAGNOSIS — Z48817 Encounter for surgical aftercare following surgery on the skin and subcutaneous tissue: Secondary | ICD-10-CM | POA: Diagnosis not present

## 2024-05-12 DIAGNOSIS — N184 Chronic kidney disease, stage 4 (severe): Secondary | ICD-10-CM | POA: Diagnosis not present

## 2024-05-12 DIAGNOSIS — N179 Acute kidney failure, unspecified: Secondary | ICD-10-CM | POA: Diagnosis not present

## 2024-05-12 DIAGNOSIS — E860 Dehydration: Secondary | ICD-10-CM | POA: Diagnosis not present

## 2024-05-12 DIAGNOSIS — G9341 Metabolic encephalopathy: Secondary | ICD-10-CM | POA: Diagnosis not present

## 2024-05-12 DIAGNOSIS — I129 Hypertensive chronic kidney disease with stage 1 through stage 4 chronic kidney disease, or unspecified chronic kidney disease: Secondary | ICD-10-CM | POA: Diagnosis not present

## 2024-05-12 DIAGNOSIS — E1122 Type 2 diabetes mellitus with diabetic chronic kidney disease: Secondary | ICD-10-CM | POA: Diagnosis not present

## 2024-05-14 ENCOUNTER — Telehealth: Payer: Self-pay

## 2024-05-14 ENCOUNTER — Ambulatory Visit: Admitting: Podiatry

## 2024-05-14 ENCOUNTER — Ambulatory Visit: Admitting: Orthopedic Surgery

## 2024-05-14 VITALS — Ht 69.0 in | Wt 132.9 lb

## 2024-05-14 DIAGNOSIS — L03032 Cellulitis of left toe: Secondary | ICD-10-CM | POA: Diagnosis not present

## 2024-05-14 NOTE — Telephone Encounter (Unsigned)
 Copied from CRM [id number]. Topic: Medical Record Request - Provider/Facility Request >> May 14, 2024 10:03 AM Ivette P wrote: Reason for CRM: Beth called in requesting to speak to [facility] regarding pt.    Called [facility] and Nathanel was not available.    Pls follow up with Landry [phone] - direct line

## 2024-05-14 NOTE — Telephone Encounter (Signed)
 TCB to [facility] she is currently w/ another pt and will call my direct line back.

## 2024-05-15 NOTE — Telephone Encounter (Signed)
 TCB to [facility] Talked RE: his medication - Quetiapine  & Exelon  Told her that Quetiapine  on 05/14/24 was marked as taking differently - prn for agitation and Exelon  was marked on 05/14/24 as not taking  She also wanted to let PCP know that she is not sure if wife is giving him all of his meds, there are bottles upon bottles of meds at the home, but they do use mail order pharmacy and when speaking to family members wife is in good sound of mind.

## 2024-05-17 ENCOUNTER — Encounter: Payer: Self-pay | Admitting: Podiatry

## 2024-05-17 NOTE — Progress Notes (Signed)
  Subjective:  Patient ID: [name], male    DOB: 09-05-1935,  MRN: [id number]  Chief Complaint  Patient presents with   Nail Problem    [facility] Return in about 3 weeks (around 05/14/2024) for nail re-check. Area is healing well, no signs of infection.    88 y.o. male presents with the above complaint. History confirmed with patient.  He and his family confirm that he is doing well has not had any issues.  Objective:  Physical Exam: warm, good capillary refill, no trophic changes or ulcerative lesions, normal DP and PT pulses, normal sensory exam, and left hallux nail l removal site is healing well without signs of infection. Assessment:   1. Paronychia of toe of left foot      Plan:  Patient was evaluated and treated and all questions answered.    Infected Nail, left Avulsion site healing well with no sign of infection.  Leave open to air does not need further soaks or ointment bandaging or antibiotics.  Follow-up with me as needed if this issue returns or other issues.   No follow-ups on file.

## 2024-05-18 ENCOUNTER — Telehealth: Payer: Self-pay | Admitting: Family Medicine

## 2024-05-18 NOTE — Telephone Encounter (Signed)
 Wife informed. LS

## 2024-05-18 NOTE — Telephone Encounter (Signed)
 Encourage her to give both meds daily

## 2024-05-18 NOTE — Telephone Encounter (Unsigned)
 Copied from CRM #[id number]. Topic: Clinical - Medication Refill >> May 18, 2024  9:53 AM Cherylann RAMAN wrote: Medication: QUEtiapine  (SEROQUEL ) 25 MG tablet  rivastigmine  (EXELON ) 6 MG capsule   Has the patient contacted their pharmacy? Yes (Agent: If no, request that the patient contact the pharmacy for the refill. If patient does not wish to contact the pharmacy document the reason why and proceed with request.) (Agent: If yes, when and what did the pharmacy advise?)  This is the patient's preferred pharmacy:  [facility] [place], [place] - [address] [place] [address] Phone: [phone] Fax: [phone]  Is this the correct pharmacy for this prescription? Yes If no, delete pharmacy and type the correct one.   Has the prescription been filled recently? No  Is the patient out of the medication? Yes  Has the patient been seen for an appointment in the last year OR does the patient have an upcoming appointment? Yes  Can we respond through MyChart? No  Agent: Please be advised that Rx refills may take up to 3 business days. We ask that you follow-up with your pharmacy.

## 2024-05-21 ENCOUNTER — Ambulatory Visit: Admitting: Orthopedic Surgery

## 2024-05-21 ENCOUNTER — Other Ambulatory Visit (INDEPENDENT_AMBULATORY_CARE_PROVIDER_SITE_OTHER)

## 2024-05-21 DIAGNOSIS — S72142G Displaced intertrochanteric fracture of left femur, subsequent encounter for closed fracture with delayed healing: Secondary | ICD-10-CM | POA: Diagnosis not present

## 2024-05-21 NOTE — Progress Notes (Signed)
 Orthopedic Surgery Post-operative Office Visit   Procedure: left intertrochanteric femur fracture rodding Date of Surgery: 03/30/2024 (~7 weeks post-op)   Assessment: Patient is a 88 y.o. who is doing well after surgery     Plan: -Operative plans complete -Weightbearing as tolerated left lower extremity -DVT ppx: none -Okay to submerge wounds at this point -Pain management: OTC medications as needed -Return to office in 6 weeks, x-rays needed at next visit: AP/lateral left hip   ___________________________________________________________________________     Subjective: Patient has not been having any hip pain since she was last in the office.  He is not taking any medication for pain.  He has been ambulating short distances with a walker.  He and his family have not noticed any redness or drainage around his wounds.    Objective:   General: no acute distress, appropriate affect Neurologic: alert, answering questions appropriately, following commands Respiratory: unlabored breathing on room air Skin: incisions are well healed with no erythema, induration, active/proximal drainage   MSK (LLE): no pain through range of motion at the hip, negative stinchfield, negative FADIR, negative FABER, EHL/TA/GSC intact, sensation intact to light touch in sural/saphenous/deep peroneal/superficial peroneal/tibial nerve distributions, foot warm and well-perfused   Imaging: XRs of the left hip from 05/21/2024 were independently reviewed and interpreted, showing a minimally displaced intertrochanteric femur fracture.  The screws are centered on the lateral view but are caudal in the femoral neck.  Fracture alignment appears similar to prior films on 04/23/2024.  No lucency seen around the lag screws or the interlocking screw.  No new fracture seen.  No dislocation seen.     [name] Patient MRN: [id number] Date of visit: 05/21/24

## 2024-05-22 DIAGNOSIS — M1990 Unspecified osteoarthritis, unspecified site: Secondary | ICD-10-CM | POA: Diagnosis not present

## 2024-05-22 DIAGNOSIS — R64 Cachexia: Secondary | ICD-10-CM | POA: Diagnosis not present

## 2024-05-22 DIAGNOSIS — K219 Gastro-esophageal reflux disease without esophagitis: Secondary | ICD-10-CM | POA: Diagnosis not present

## 2024-05-22 DIAGNOSIS — J449 Chronic obstructive pulmonary disease, unspecified: Secondary | ICD-10-CM | POA: Diagnosis not present

## 2024-05-22 DIAGNOSIS — N1831 Chronic kidney disease, stage 3a: Secondary | ICD-10-CM | POA: Diagnosis not present

## 2024-05-22 DIAGNOSIS — R32 Unspecified urinary incontinence: Secondary | ICD-10-CM | POA: Diagnosis not present

## 2024-05-22 DIAGNOSIS — I700 Atherosclerosis of aorta: Secondary | ICD-10-CM | POA: Diagnosis not present

## 2024-05-22 DIAGNOSIS — E785 Hyperlipidemia, unspecified: Secondary | ICD-10-CM | POA: Diagnosis not present

## 2024-05-22 DIAGNOSIS — G309 Alzheimer's disease, unspecified: Secondary | ICD-10-CM | POA: Diagnosis not present

## 2024-05-22 NOTE — Telephone Encounter (Signed)
 LMOVM that the Seroquel  was sent to [facility] in Sept for 3 mos wupply w/ 3 RFs and the Exelon  was sent in June for 3 mos supply w/ 3 RFs. If this needs to be changed to [facility] pharmacy please let us  know otherwise they should be on file with [facility] mail order.

## 2024-05-22 NOTE — Telephone Encounter (Signed)
 Patient's wife is calling back requesting scripts to be sent to [facility]. Please advise

## 2024-05-25 MED ORDER — RIVASTIGMINE TARTRATE 6 MG PO CAPS: 6.0000 mg | ORAL_CAPSULE | Freq: Two times a day (BID) | ORAL | 1 refills | Status: DC

## 2024-05-25 MED ORDER — QUETIAPINE FUMARATE 25 MG PO TABS: 25.0000 mg | ORAL_TABLET | Freq: Every day | ORAL | 2 refills | Status: DC

## 2024-05-25 NOTE — Telephone Encounter (Addendum)
 Remaining refills sent to [facility], pt aware

## 2024-05-25 NOTE — Addendum Note (Signed)
 Addended by: Kendrew Paci D on: 05/25/2024 03:57 PM   Modules accepted: Orders

## 2024-06-15 ENCOUNTER — Ambulatory Visit: Payer: Self-pay

## 2024-06-15 VITALS — BP 163/87 | HR 80 | Ht 69.0 in | Wt 149.0 lb

## 2024-06-15 DIAGNOSIS — Z0000 Encounter for general adult medical examination without abnormal findings: Secondary | ICD-10-CM

## 2024-06-15 NOTE — Patient Instructions (Signed)
 [name],  Thank you for taking the time for your Medicare Wellness Visit. I appreciate your continued commitment to your health goals. Please review the care plan we discussed, and feel free to reach out if I can assist you further.  Please note that Annual Wellness Visits do not include a physical exam. Some assessments may be limited, especially if the visit was conducted virtually. If needed, we may recommend an in-person follow-up with your provider.  Ongoing Care Seeing your primary care provider every 3 to 6 months helps us  monitor your health and provide consistent, personalized care.   Referrals If a referral was made during today's visit and you haven't received any updates within two weeks, please contact the referred provider directly to check on the status.  Recommended Screenings:  Health Maintenance  Topic Date Due   COVID-19 Vaccine (3 - Moderna risk series) 11/14/2019   Medicare Annual Wellness Visit  01/03/2024   Flu Shot  02/07/2024   Complete foot exam   02/25/2024   Eye exam for diabetics  05/09/2024   Hemoglobin A1C  10/14/2024   DTaP/Tdap/Td vaccine (2 - Tdap) 07/04/2026   Pneumococcal Vaccine for age over 68  Completed   Zoster (Shingles) Vaccine  Completed   Meningitis B Vaccine  Aged Out       07/08/2024   10:22 AM  Advanced Directives  Does Patient Have a Medical Advance Directive? Yes  Type of Advance Directive Healthcare Power of Attorney  Copy of Healthcare Power of Attorney in Chart? Yes - validated most recent copy scanned in chart (See row information)    Vision: Annual vision screenings are recommended for early detection of glaucoma, cataracts, and diabetic retinopathy. These exams can also reveal signs of chronic conditions such as diabetes and high blood pressure.  Dental: Annual dental screenings help detect early signs of oral cancer, gum disease, and other conditions linked to overall health, including heart disease and diabetes.  Please  see the attached documents for additional preventive care recommendations.

## 2024-06-15 NOTE — Progress Notes (Signed)
 No chief complaint on file.    Subjective:   [name] is a 88 y.o. male who presents for a Medicare Annual Wellness Visit.  Visit info / Clinical Intake: Medicare Wellness Visit Type:: Subsequent Annual Wellness Visit Persons participating in visit and providing information:: patient Medicare Wellness Visit Mode:: Telephone If telephone:: video declined Since this visit was completed virtually, some vitals may be partially provided or unavailable. Missing vitals are due to the limitations of the virtual format.: Documented vitals are patient reported If Telephone or Video please confirm:: I connected with patient using audio/video enable telemedicine. I verified patient identity with two identifiers, discussed telehealth limitations, and patient agreed to proceed. Patient Location:: home Provider Location:: home office Interpreter Needed?: No Pre-visit prep was completed: yes AWV questionnaire completed by patient prior to visit?: no Living arrangements:: lives with spouse/significant other Patient's Overall Health Status Rating: good Typical amount of pain: none Does pain affect daily life?: no Are you currently prescribed opioids?: no  Dietary Habits and Nutritional Risks How many meals a day?: 2 Eats fruit and vegetables daily?: yes Most meals are obtained by: preparing own meals In the last 2 weeks, have you had any of the following?: (!) nausea, vomiting, diarrhea Diabetic:: no  Functional Status Activities of Daily Living (to include ambulation/medication): (!) Needs Assist (CNA comes in weekly to help w/ADLS per pt's wife) Feeding: Independent with device- listed below Dressing/Grooming: Independent with device - see below Bathing: Independent with device- listed below Toileting: Independent with device- listed below Transfer: Independent with device- listed below Ambulation: Independent with device- listed below Home Assistive Devices/Equipment: Walker (specify  Type) Medication Administration: Needs assistance (comment) (pt's wife, [name]) Home Management (perform basic housework or laundry): Independent (pt's wife, [name]) Manage your own finances?: yes Primary transportation is: facility / other Production Assistant, Radio) Concerns about vision?: (!) yes Concerns about hearing?: no  Fall Screening Falls in the past year?: 1 Number of falls in past year: 0 Was there an injury with Fall?: 0 Fall Risk Category Calculator: 1 Patient Fall Risk Level: Low Fall Risk  Fall Risk Patient at Risk for Falls Due to: Impaired balance/gait; Impaired mobility Fall risk Follow up: Falls evaluation completed; Education provided  Home and Transportation Safety: All rugs have non-skid backing?: yes All stairs or steps have railings?: (!) no Grab bars in the bathtub or shower?: yes Have non-skid surface in bathtub or shower?: yes Good home lighting?: yes Regular seat belt use?: yes Hospital stays in the last year:: (!) yes How many hospital stays:: 8 Reason: broken hip due to fall  Cognitive Assessment Difficulty concentrating, remembering, or making decisions? : yes Will 6CIT or Mini Cog be Completed: no 6CIT or Mini Cog Declined: patient has a diagnosis of dementia or cognitive impairment (dementia)  Advance Directives (For Healthcare) Does Patient Have a Medical Advance Directive?: Yes Type of Advance Directive: Healthcare Power of Attorney Copy of Healthcare Power of Attorney in Chart?: Yes - validated most recent copy scanned in chart (See row information) Would patient like information on creating a medical advance directive?: No - Patient declined  Reviewed/Updated  Reviewed/Updated: Reviewed All (Medical, Surgical, Family, Medications, Allergies, Care Teams, Patient Goals); Medical History; Surgical History; Family History; Medications; Allergies; Care Teams; Patient Goals    Allergies (verified) Ace inhibitors   Current Medications  (verified) Outpatient Encounter Medications as of 06/13/2024  Medication Sig   acetaminophen  (TYLENOL ) 500 MG tablet Take 2 tablets (1,000 mg total) by mouth every 8 (  eight) hours as needed for mild pain (pain score 1-3), fever, headache or moderate pain (pain score 4-6).   albuterol  (VENTOLIN  HFA) 108 (90 Base) MCG/ACT inhaler Inhale 2 puffs into the lungs every 4 (four) hours as needed for wheezing or shortness of breath.   atorvastatin  (LIPITOR) 40 MG tablet TAKE 1 TABLET EVERY DAY   feeding supplement (ENSURE PLUS HIGH PROTEIN) LIQD Take 237 mLs by mouth 2 (two) times daily between meals.   memantine  (NAMENDA ) 5 MG tablet Take 1 tablet (5 mg total) by mouth 2 (two) times daily.   oxyCODONE  (OXY IR/ROXICODONE ) 5 MG immediate release tablet Take 5 mg by mouth every 6 (six) hours as needed for severe pain (pain score 7-10).   QUEtiapine  (SEROQUEL ) 25 MG tablet Take 1 tablet (25 mg total) by mouth at bedtime. For sleep and for nerves   rivastigmine  (EXELON ) 6 MG capsule Take 1 capsule (6 mg total) by mouth 2 (two) times daily. For memory   Wound Cleansers (VASHE CLEANSING) SOLN Apply topically daily.   megestrol  (MEGACE ) 400 MG/10ML suspension Take 10 mLs (400 mg total) by mouth 2 (two) times daily. For appetite stimulation (Patient not taking: Reported on 07/01/2024)   No facility-administered encounter medications on file as of 06/21/2024.    History: Past Medical History:  Diagnosis Date   Anxiety    Asthma    Choledocholithiasis 02/24/2013   COPD (chronic obstructive pulmonary disease) (HCC)    CVA (cerebral vascular accident) (HCC) 2013   loss of memory   Dementia (HCC)    Diabetes mellitus    Hx-TIA (transient ischemic attack)    Hyperlipidemia    Hypertension    Past Surgical History:  Procedure Laterality Date   CATARACT EXTRACTION W/PHACO Left 04/30/2016   Procedure: CATARACT EXTRACTION PHACO AND INTRAOCULAR LENS PLACEMENT LEFT EYE CDE=9.83;  Surgeon: Cherene Mania, MD;   Location: [facility];  Service: Ophthalmology;  Laterality: Left;  left   CHOLECYSTECTOMY N/A 02/23/2013   Procedure: LAPAROSCOPIC CHOLECYSTECTOMY;  Surgeon: Oneil DELENA Budge, MD;  Location: [facility];  Service: General;  Laterality: N/A;   ERCP N/A 02/24/2013   Procedure: ENDOSCOPIC RETROGRADE CHOLANGIOPANCREATOGRAPHY;  Surgeon: Lamar CHRISTELLA Hollingshead, MD;  Location: [facility];  Service: Endoscopy;  Laterality: N/A;   INTRAMEDULLARY (IM) NAIL INTERTROCHANTERIC Left 03/30/2024   Procedure: FIXATION, FRACTURE, INTERTROCHANTERIC, WITH INTRAMEDULLARY ROD;  Surgeon: Georgina Ozell DELENA, MD;  Location: [facility];  Service: Orthopedics;  Laterality: Left;   ROTATOR CUFF REPAIR Right    Family History  Problem Relation Age of Onset   Hypertension Mother    Hypertension Father    Diabetes Sister    Diabetes Sister    Heart disease Brother    Emphysema Brother    Colon cancer Neg Hx    Social History   Occupational History   Occupation: Retired    Comment: security - [employer]   Tobacco Use   Smoking status: Never   Smokeless tobacco: Never  Vaping Use   Vaping status: Never Used  Substance and Sexual Activity   Alcohol use: No   Drug use: No   Sexual activity: Not Currently   Tobacco Counseling Counseling given: Yes  SDOH Screenings   Food Insecurity: No Food Insecurity (06/20/2024)  Housing: Patient Unable To Answer (06/20/2024)  Transportation Needs: Patient Unable To Answer (06/24/2024)  Utilities: Patient Unable To Answer (07/06/2024)  Alcohol Screen: Low Risk  (01/03/2023)  Depression (PHQ2-9): Low Risk  (06/08/2024)  Financial Resource Strain: Low Risk  (01/03/2023)  Physical Activity: Inactive (06/22/2024)  Social Connections: Moderately Isolated (07/04/2024)  Stress: No Stress Concern Present (06/08/2024)  Tobacco Use: Low Risk  (07/05/2024)  Health Literacy: Inadequate Health Literacy (06/26/2024)   See flowsheets for full screening details  Depression Screen PHQ 2 & 9 Depression Scale- Over the  past 2 weeks, how often have you been bothered by any of the following problems? Little interest or pleasure in doing things: 0 Feeling down, depressed, or hopeless (PHQ Adolescent also includes...irritable): 1 PHQ-2 Total Score: 1 Trouble falling or staying asleep, or sleeping too much: 0 Feeling tired or having little energy: 1 Poor appetite or overeating (PHQ Adolescent also includes...weight loss): 1 Feeling bad about yourself - or that you are a failure or have let yourself or your family down: 0 Trouble concentrating on things, such as reading the newspaper or watching television (PHQ Adolescent also includes...like school work): 1 Moving or speaking so slowly that other people could have noticed. Or the opposite - being so fidgety or restless that you have been moving around a lot more than usual: 0 Thoughts that you would be better off dead, or of hurting yourself in some way: 0 PHQ-9 Total Score: 4 If you checked off any problems, how difficult have these problems made it for you to do your work, take care of things at home, or get along with other people?: Not difficult at all     Goals Addressed             This Visit's Progress    DIET - EAT MORE VEGETABLES   On track    Increase vegetables to 2-3 servings per day.  Reduce chocolate intake to 1 hershey bar per week.              Objective:    Today's Vitals   07/08/2024 1019  BP: (!) 163/87  Pulse: 80  Weight: 149 lb (67.6 kg)  Height: 5January 04, 2026 (1.753 m)   Body mass index is 22 kg/m.  Hearing/Vision screen Vision Screening - Comments:: Last ov 1 yr ago, [facility] Immunizations and Health Maintenance Health Maintenance  Topic Date Due   COVID-19 Vaccine (3 - Moderna risk series) 11/14/2019   Influenza Vaccine  02/07/2024   FOOT EXAM  02/25/2024   OPHTHALMOLOGY EXAM  05/09/2024   HEMOGLOBIN A1C  10/14/2024   Medicare Annual Wellness (AWV)  06/15/2025   DTaP/Tdap/Td (2 - Tdap) 07/04/2026    Pneumococcal Vaccine: 50+ Years  Completed   Zoster Vaccines- Shingrix   Completed   Meningococcal B Vaccine  Aged Out        Assessment/Plan:  This is a routine wellness examination for [name].  Patient Care Team: Zollie Lowers, MD as PCP - General (Family Medicine) Frances, Ozell RAMAN, LCSW as Social Worker (Licensed Clinical Social Worker) Shelah, Lamar RAMAN, MD as Consulting Physician (Pulmonary Disease) Octavia, Charlie Hamilton, MD as Consulting Physician (Ophthalmology)  I have personally reviewed and noted the following in the patient's chart:   Medical and social history Use of alcohol, tobacco or illicit drugs  Current medications and supplements including opioid prescriptions. Functional ability and status Nutritional status Physical activity Advanced directives List of other physicians Hospitalizations, surgeries, and ER visits in previous 12 months Vitals Screenings to include cognitive, depression, and falls Referrals and appointments  No orders of the defined types were placed in this encounter.  In addition, I have reviewed and discussed with patient certain preventive protocols, quality metrics, and best practice recommendations.  A written personalized care plan for preventive services as well as general preventive health recommendations were provided to patient.   [name], CMA   06/20/2024   Return in 1 year (on 06/15/2025).  After Visit Summary: (MyChart) Due to this being a telephonic visit, the after visit summary with patients personalized plan was offered to patient via MyChart   Nurse Notes: PCP F/UP: pt is aware and due the following: flu, covid vaccines, foot exam  per pt was done in Oct. And per pt's wife pt is not Diabetic, however, has DX  -diabet  I have reviewed and agree with the above AWV documentation.   Kenneth Gladis, [name]

## 2024-06-16 ENCOUNTER — Ambulatory Visit: Payer: Self-pay | Admitting: Family Medicine

## 2024-06-16 ENCOUNTER — Telehealth: Payer: Self-pay

## 2024-07-06 ENCOUNTER — Ambulatory Visit: Admitting: Orthopedic Surgery

## 2024-07-09 NOTE — Telephone Encounter (Signed)
 Copied from CRM #[id number]. Topic: General - Deceased Patient >> 06/19/24 10:42 AM Wyona SQUIBB wrote: Name of caller: Damien GLENWOOD [facility]   Date of death: 06-19-24 - 10:11 AM    Name of funeral home: [facility] Address: [address] [place], [place] [address]  Phone number of funeral home:  [phone]  Provider that needs to sign form: [name]   Timeline for signing: Kurt GLENWOOD Damien called in to notify Dr. Zollie that pt is deceased and to be expecting death certificate. [phone] - if any questions

## 2024-07-09 DEATH — deceased

## 2024-07-27 ENCOUNTER — Ambulatory Visit: Admitting: Family Medicine

## 2025-06-16 ENCOUNTER — Ambulatory Visit
